# Patient Record
Sex: Male | Born: 1955
Health system: Southern US, Community
[De-identification: ages and names within clinical notes are randomized; demographics above are authoritative.]

## PROBLEM LIST (undated history)

## (undated) DIAGNOSIS — I2510 Atherosclerotic heart disease of native coronary artery without angina pectoris: Secondary | ICD-10-CM

## (undated) DIAGNOSIS — E785 Hyperlipidemia, unspecified: Secondary | ICD-10-CM

## (undated) DIAGNOSIS — K219 Gastro-esophageal reflux disease without esophagitis: Secondary | ICD-10-CM

## (undated) DIAGNOSIS — I200 Unstable angina: Secondary | ICD-10-CM

## (undated) DIAGNOSIS — I10 Essential (primary) hypertension: Secondary | ICD-10-CM

## (undated) DIAGNOSIS — G43109 Migraine with aura, not intractable, without status migrainosus: Secondary | ICD-10-CM

## (undated) HISTORY — PX: NASAL SEPTUM SURGERY: SHX37

## (undated) HISTORY — PX: SIGMOIDOSCOPY: SUR1295

## (undated) HISTORY — PX: APPENDECTOMY: SHX54

## (undated) HISTORY — PX: COLONOSCOPY: SHX174

## (undated) HISTORY — DX: Atherosclerotic heart disease of native coronary artery without angina pectoris: I25.10

## (undated) HISTORY — DX: Hyperlipidemia, unspecified: E78.5

## (undated) HISTORY — PX: INGUINAL HERNIA REPAIR: SUR1180

---
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
## 2008-06-16 ENCOUNTER — Emergency Department (HOSPITAL_BASED_OUTPATIENT_CLINIC_OR_DEPARTMENT_OTHER): Admission: EM | Admit: 2008-06-16 | Discharge: 2008-06-16 | Payer: Self-pay | Admitting: Emergency Medicine

## 2011-04-23 LAB — DIFFERENTIAL
Basophils Relative: 2 — ABNORMAL HIGH
Lymphs Abs: 1.4
Monocytes Absolute: 0.8
Monocytes Relative: 6
Neutro Abs: 9.9 — ABNORMAL HIGH

## 2011-04-23 LAB — BASIC METABOLIC PANEL
CO2: 30
Calcium: 9.1
Chloride: 101
GFR calc Af Amer: >60
Sodium: 138

## 2011-04-23 LAB — CBC
Hemoglobin: 16.5
MCHC: 34.7
RBC: 5.59

## 2011-04-23 LAB — URINALYSIS, ROUTINE W REFLEX MICROSCOPIC
Bilirubin Urine: NEGATIVE
Glucose, UA: NEGATIVE
Hgb urine dipstick: NEGATIVE
Specific Gravity, Urine: 1.017
Urobilinogen, UA: 0.2
pH: 7.5

## 2011-08-17 ENCOUNTER — Encounter (HOSPITAL_BASED_OUTPATIENT_CLINIC_OR_DEPARTMENT_OTHER): Payer: Self-pay | Admitting: *Deleted

## 2011-08-17 ENCOUNTER — Emergency Department (HOSPITAL_BASED_OUTPATIENT_CLINIC_OR_DEPARTMENT_OTHER)
Admission: EM | Admit: 2011-08-17 | Discharge: 2011-08-17 | Disposition: A | Discharge status: Home / Self Care | Payer: 59 | Attending: Emergency Medicine | Admitting: Emergency Medicine

## 2011-08-17 DIAGNOSIS — R040 Epistaxis: Secondary | ICD-10-CM | POA: Insufficient documentation

## 2011-08-17 MED ORDER — SILVER NITRATE-POT NITRATE 75-25 % EX MISC
CUTANEOUS | Status: AC
  Filled 2011-08-17: qty 2

## 2011-08-17 MED ORDER — SILVER NITRATE-POT NITRATE 75-25 % EX MISC
2.0000 | Freq: Once | CUTANEOUS | Status: AC
  Administered 2011-08-17: 2 via TOPICAL

## 2011-08-17 MED ORDER — COCAINE HCL 4 % EX SOLN
4.0000 mL | Freq: Once | CUTANEOUS | Status: AC
  Administered 2011-08-17: 4 mL via NASAL

## 2011-08-17 MED ORDER — COCAINE HCL 4 % EX SOLN
CUTANEOUS | Status: AC
  Filled 2011-08-17: qty 4

## 2011-08-17 MED ORDER — LIDOCAINE HCL 2 % EX GEL
CUTANEOUS | Status: AC
  Filled 2011-08-17: qty 20

## 2011-08-17 NOTE — ED Provider Notes (Signed)
History     CSN: [id number]  Arrival date & time 08/17/11  0416   First MD Initiated Contact with Patient 08/17/11 0424      Chief Complaint  Patient presents with  . Epistaxis    (Consider location/radiation/quality/duration/timing/severity/associated sxs/prior treatment) Patient is a 56 y.o. male presenting with nosebleeds. The history is provided by the patient.  Epistaxis  This is a new problem. The current episode started less than 1 hour ago. The problem has been rapidly improving. The problem is associated with aspirin. The bleeding has been from the left nare. He has tried nothing for the symptoms. His past medical history is significant for colds. His past medical history does not include bleeding disorder, sinus problems or frequent nosebleeds.  Pt has had a recent URI.  History reviewed. No pertinent past medical history.  Past Surgical History  Procedure Date  . Appendectomy     No family history on file.  History  Substance Use Topics  . Smoking status: Never Smoker   . Smokeless tobacco: Not on file  . Alcohol Use: Yes     occassionaly      Review of Systems  HENT: Positive for nosebleeds.   All other systems reviewed and are negative.    Allergies  Review of patient's allergies indicates no known allergies.  Home Medications   Current Outpatient Rx  Name Route Sig Dispense Refill  . ASPIRIN 81 MG PO TABS Oral Take 81 mg by mouth daily.      BP 139/101  Pulse 93  Resp 18  SpO2 95%  Physical Exam  Nursing note and vitals reviewed. Constitutional: He appears well-developed and well-nourished. No distress.  HENT:  Head: Normocephalic and atraumatic.  Right Ear: External ear normal.  Left Ear: External ear normal.  Nose: No nasal septal hematoma. Epistaxis is observed.       Left nares with fresh blood, no active bleeding  Eyes: Conjunctivae are normal. Right eye exhibits no discharge. Left eye exhibits no discharge. No scleral icterus.    Neck: Neck supple. No tracheal deviation present.  Cardiovascular: Normal rate.   Pulmonary/Chest: Effort normal. No stridor. No respiratory distress.  Musculoskeletal: He exhibits no edema.  Neurological: He is alert. Cranial nerve deficit: no gross deficits.  Skin: Skin is warm and dry. No rash noted.  Psychiatric: He has a normal mood and affect.    ED Course  EPISTAXIS MANAGEMENT Performed by: Linwood Dibbles R Authorized by: Linwood Dibbles R Consent: Verbal consent obtained. Anesthesia: see MAR for details Local anesthetic: topical anesthetic Treatment site: left anterior Repair method: silver nitrate Post-procedure assessment: bleeding stopped Treatment complexity: simple   (including critical care time)  Labs Reviewed - No data to display No results found.   1. Epistaxis       MDM  5:37 AM Pt monitored in the ED.  No further bleeding.  Discussed precautions and to consider ENT follow up.        Celene Kras, MD 08/17/11 [phone]

## 2011-08-17 NOTE — ED Notes (Signed)
Patient states his nose has been bleeding for the past 15 minutes, but he has not been holding pressure that whole time.

## 2011-08-19 ENCOUNTER — Emergency Department (HOSPITAL_BASED_OUTPATIENT_CLINIC_OR_DEPARTMENT_OTHER)
Admission: EM | Admit: 2011-08-19 | Discharge: 2011-08-19 | Disposition: A | Discharge status: Home / Self Care | Payer: 59 | Attending: Emergency Medicine | Admitting: Emergency Medicine

## 2011-08-19 ENCOUNTER — Encounter (HOSPITAL_BASED_OUTPATIENT_CLINIC_OR_DEPARTMENT_OTHER): Payer: Self-pay | Admitting: *Deleted

## 2011-08-19 DIAGNOSIS — R040 Epistaxis: Secondary | ICD-10-CM

## 2011-08-19 MED ORDER — HYDROCODONE-ACETAMINOPHEN 5-325 MG PO TABS: 2.0000 | ORAL_TABLET | ORAL | Status: AC | PRN

## 2011-08-19 NOTE — ED Provider Notes (Signed)
History     CSN: [id number]  Arrival date & time 08/19/11  1478   First MD Initiated Contact with Patient 08/19/11 2255      Chief Complaint  Patient presents with  . Epistaxis    (Consider location/radiation/quality/duration/timing/severity/associated sxs/prior treatment) Patient is a 56 y.o. male presenting with nosebleeds. The history is provided by the patient. No language interpreter was used.  Epistaxis  This is a recurrent problem. The current episode started 3 to 5 hours ago. The problem occurs constantly. The problem has been rapidly worsening. The problem is associated with aspirin. The bleeding has been from the left nare. He has tried applying pressure and vasoconstrictors for the symptoms. The treatment provided no relief. His past medical history is significant for sinus problems.  Pt had nosebleed cauterized 2 days ago.  Pt reports bleeding returned and has continued today  History reviewed. No pertinent past medical history.  Past Surgical History  Procedure Date  . Appendectomy     History reviewed. No pertinent family history.  History  Substance Use Topics  . Smoking status: Never Smoker   . Smokeless tobacco: Not on file  . Alcohol Use: Yes     occassionaly      Review of Systems  HENT: Positive for nosebleeds.   All other systems reviewed and are negative.    Allergies  Review of patient's allergies indicates no known allergies.  Home Medications   Current Outpatient Rx  Name Route Sig Dispense Refill  . ASPIRIN 81 MG PO TABS Oral Take 81 mg by mouth daily.    Marland Kitchen DIPHENHYDRAMINE HCL 50 MG PO CAPS Oral Take 50 mg by mouth at bedtime. For sleep      BP 134/89  Pulse 105  Temp(Src) 97.7 F (36.5 C) (Oral)  Resp 18  Ht 5\' 11"  (1.803 m)  Wt 195 lb (88.451 kg)  BMI 27.20 kg/m2  SpO2 99%  Physical Exam  Nursing note and vitals reviewed. Constitutional: He is oriented to person, place, and time. He appears well-developed and  well-nourished.  HENT:  Head: Normocephalic.  Mouth/Throat: Oropharynx is clear and moist.  Eyes: Pupils are equal, round, and reactive to light.  Neck: Normal range of motion.  Cardiovascular: Normal rate.   Neurological: He is alert and oriented to person, place, and time. He has normal reflexes.  Skin: Skin is warm.  Psychiatric: He has a normal mood and affect.    ED Course  Procedures (including critical care time)  Labs Reviewed - No data to display No results found.   No diagnosis found.    MDM     Pt counseled on follow up.  Pt advised to call ENT to be seen.  Packing/ballon removal in 2 days.      Langston Masker, [place] 08/19/11 2323

## 2011-08-19 NOTE — ED Notes (Signed)
C/o nosebleed x 1 hour

## 2011-09-09 NOTE — ED Provider Notes (Signed)
Medical screening examination/treatment/procedure(s) were performed by non-physician practitioner and as supervising physician I was immediately available for consultation/collaboration.   Deneen Slager, MD 09/09/11 1129 

## 2012-06-21 HISTORY — PX: CARDIAC CATHETERIZATION: SHX172

## 2012-06-25 ENCOUNTER — Observation Stay (HOSPITAL_COMMUNITY): Payer: No Typology Code available for payment source

## 2012-06-25 ENCOUNTER — Encounter (HOSPITAL_BASED_OUTPATIENT_CLINIC_OR_DEPARTMENT_OTHER): Payer: Self-pay | Admitting: *Deleted

## 2012-06-25 ENCOUNTER — Inpatient Hospital Stay (HOSPITAL_BASED_OUTPATIENT_CLINIC_OR_DEPARTMENT_OTHER)
Admission: EM | Admit: 2012-06-25 | Discharge: 2012-07-03 | DRG: 234 | Disposition: A | Discharge status: Home / Self Care | Payer: No Typology Code available for payment source | Attending: Thoracic Surgery (Cardiothoracic Vascular Surgery) | Admitting: Thoracic Surgery (Cardiothoracic Vascular Surgery)

## 2012-06-25 ENCOUNTER — Emergency Department (HOSPITAL_BASED_OUTPATIENT_CLINIC_OR_DEPARTMENT_OTHER): Payer: No Typology Code available for payment source

## 2012-06-25 DIAGNOSIS — E876 Hypokalemia: Secondary | ICD-10-CM | POA: Diagnosis present

## 2012-06-25 DIAGNOSIS — Z8249 Family history of ischemic heart disease and other diseases of the circulatory system: Secondary | ICD-10-CM

## 2012-06-25 DIAGNOSIS — E782 Mixed hyperlipidemia: Secondary | ICD-10-CM

## 2012-06-25 DIAGNOSIS — Y92009 Unspecified place in unspecified non-institutional (private) residence as the place of occurrence of the external cause: Secondary | ICD-10-CM

## 2012-06-25 DIAGNOSIS — I2510 Atherosclerotic heart disease of native coronary artery without angina pectoris: Principal | ICD-10-CM | POA: Diagnosis present

## 2012-06-25 DIAGNOSIS — R079 Chest pain, unspecified: Secondary | ICD-10-CM

## 2012-06-25 DIAGNOSIS — I200 Unstable angina: Secondary | ICD-10-CM | POA: Diagnosis present

## 2012-06-25 DIAGNOSIS — Z951 Presence of aortocoronary bypass graft: Secondary | ICD-10-CM

## 2012-06-25 DIAGNOSIS — T502X5A Adverse effect of carbonic-anhydrase inhibitors, benzothiadiazides and other diuretics, initial encounter: Secondary | ICD-10-CM | POA: Diagnosis present

## 2012-06-25 DIAGNOSIS — I10 Essential (primary) hypertension: Secondary | ICD-10-CM | POA: Diagnosis present

## 2012-06-25 DIAGNOSIS — E871 Hypo-osmolality and hyponatremia: Secondary | ICD-10-CM

## 2012-06-25 DIAGNOSIS — G43909 Migraine, unspecified, not intractable, without status migrainosus: Secondary | ICD-10-CM

## 2012-06-25 DIAGNOSIS — D62 Acute posthemorrhagic anemia: Secondary | ICD-10-CM | POA: Diagnosis not present

## 2012-06-25 DIAGNOSIS — Z7982 Long term (current) use of aspirin: Secondary | ICD-10-CM

## 2012-06-25 HISTORY — DX: Migraine with aura, not intractable, without status migrainosus: G43.109

## 2012-06-25 HISTORY — DX: Essential (primary) hypertension: I10

## 2012-06-25 LAB — BASIC METABOLIC PANEL
CO2: 29 mEq/L (ref 19–32)
CO2: 28 mEq/L (ref 19–32)
Calcium: 8.8 mg/dL (ref 8.4–10.5)
Calcium: 8.6 mg/dL (ref 8.4–10.5)
Chloride: 86 mEq/L — ABNORMAL LOW (ref 96–112)
Chloride: 87 mEq/L — ABNORMAL LOW (ref 96–112)
Creatinine, Ser: 0.9 mg/dL (ref 0.50–1.35)
Creatinine, Ser: 0.8 mg/dL (ref 0.50–1.35)
Glucose, Bld: 105 mg/dL — ABNORMAL HIGH (ref 70–99)
Glucose, Bld: 133 mg/dL — ABNORMAL HIGH (ref 70–99)

## 2012-06-25 LAB — CBC WITH DIFFERENTIAL/PLATELET
Eosinophils Absolute: 0.2 10*3/uL (ref 0.0–0.7)
Eosinophils Relative: 2 % (ref 0–5)
HCT: 44.6 % (ref 39.0–52.0)
Hemoglobin: 17.1 g/dL — ABNORMAL HIGH (ref 13.0–17.0)
Lymphocytes Relative: 40 % (ref 12–46)
Lymphs Abs: 4 10*3/uL (ref 0.7–4.0)
MCH: 29.7 pg (ref 26.0–34.0)
MCV: 77.6 fL — ABNORMAL LOW (ref 78.0–100.0)
Monocytes Absolute: 1.1 10*3/uL — ABNORMAL HIGH (ref 0.1–1.0)
Monocytes Relative: 11 % (ref 3–12)
RBC: 5.75 MIL/uL (ref 4.22–5.81)

## 2012-06-25 LAB — POCT I-STAT, CHEM 8
Creatinine, Ser: 1 mg/dL (ref 0.50–1.35)
Glucose, Bld: 104 mg/dL — ABNORMAL HIGH (ref 70–99)
Hemoglobin: 16.3 g/dL (ref 13.0–17.0)
Potassium: 3.4 mEq/L — ABNORMAL LOW (ref 3.5–5.1)
TCO2: 26 mmol/L (ref 0–100)

## 2012-06-25 MED ORDER — SODIUM CHLORIDE 0.9 % IV SOLN
1000.0000 mL | INTRAVENOUS | Status: DC
  Administered 2012-06-25 – 2012-06-29 (×4): 1000 mL via INTRAVENOUS

## 2012-06-25 MED ORDER — NITROGLYCERIN 0.4 MG SL SUBL: 0.4000 mg | SUBLINGUAL_TABLET | SUBLINGUAL | Status: DC | PRN

## 2012-06-25 MED ORDER — IOHEXOL 350 MG/ML SOLN
80.0000 mL | Freq: Once | INTRAVENOUS | Status: AC | PRN
  Administered 2012-06-25: 80 mL via INTRAVENOUS

## 2012-06-25 MED ORDER — PROPRANOLOL HCL ER 60 MG PO CP24
60.0000 mg | ORAL_CAPSULE | Freq: Every day | ORAL | Status: DC
  Administered 2012-06-26 – 2012-06-28 (×3): 60 mg via ORAL
  Filled 2012-06-25 (×4): qty 1

## 2012-06-25 MED ORDER — NITROGLYCERIN 0.4 MG SL SUBL
0.4000 mg | SUBLINGUAL_TABLET | Freq: Once | SUBLINGUAL | Status: AC
  Administered 2012-06-25: 0.4 mg via SUBLINGUAL

## 2012-06-25 MED ORDER — METOPROLOL TARTRATE 1 MG/ML IV SOLN
INTRAVENOUS | Status: AC
  Administered 2012-06-25: 2.5 mg via INTRAVENOUS
  Filled 2012-06-25: qty 5

## 2012-06-25 MED ORDER — ASPIRIN 81 MG PO CHEW
81.0000 mg | CHEWABLE_TABLET | Freq: Every day | ORAL | Status: DC
  Filled 2012-06-25: qty 1

## 2012-06-25 MED ORDER — ATORVASTATIN CALCIUM 40 MG PO TABS
40.0000 mg | ORAL_TABLET | Freq: Every day | ORAL | Status: DC
  Administered 2012-06-26 – 2012-06-28 (×3): 40 mg via ORAL
  Filled 2012-06-25 (×4): qty 1

## 2012-06-25 MED ORDER — POTASSIUM CHLORIDE CRYS ER 20 MEQ PO TBCR
40.0000 meq | EXTENDED_RELEASE_TABLET | Freq: Once | ORAL | Status: AC
  Administered 2012-06-25: 40 meq via ORAL
  Filled 2012-06-25: qty 2

## 2012-06-25 MED ORDER — ACETAMINOPHEN 325 MG PO TABS: 650.0000 mg | ORAL_TABLET | ORAL | Status: DC | PRN

## 2012-06-25 MED ORDER — POTASSIUM CHLORIDE 20 MEQ/15ML (10%) PO LIQD
40.0000 meq | Freq: Once | ORAL | Status: AC
  Administered 2012-06-25: 40 meq via ORAL
  Filled 2012-06-25: qty 30

## 2012-06-25 MED ORDER — ACETAMINOPHEN 325 MG PO TABS
650.0000 mg | ORAL_TABLET | Freq: Once | ORAL | Status: AC
  Administered 2012-06-25: 650 mg via ORAL
  Filled 2012-06-25: qty 2

## 2012-06-25 MED ORDER — NITROGLYCERIN 0.4 MG SL SUBL
SUBLINGUAL_TABLET | SUBLINGUAL | Status: AC
  Administered 2012-06-25: 0.4 mg via SUBLINGUAL
  Filled 2012-06-25: qty 25

## 2012-06-25 MED ORDER — MORPHINE SULFATE 2 MG/ML IJ SOLN
2.0000 mg | Freq: Once | INTRAMUSCULAR | Status: AC
  Administered 2012-06-25: 2 mg via INTRAVENOUS
  Filled 2012-06-25: qty 1

## 2012-06-25 MED ORDER — METOPROLOL TARTRATE 1 MG/ML IV SOLN
2.5000 mg | Freq: Once | INTRAVENOUS | Status: AC
  Administered 2012-06-25: 2.5 mg via INTRAVENOUS

## 2012-06-25 MED ORDER — ASPIRIN 81 MG PO CHEW
324.0000 mg | CHEWABLE_TABLET | Freq: Once | ORAL | Status: AC
  Administered 2012-06-25: 324 mg via ORAL
  Filled 2012-06-25: qty 4

## 2012-06-25 MED ORDER — SODIUM CHLORIDE 0.9 % IV SOLN
Freq: Once | INTRAVENOUS | Status: AC
  Administered 2012-06-25: 12:00:00 via INTRAVENOUS

## 2012-06-25 MED ORDER — SODIUM CHLORIDE 0.9 % IV SOLN
1000.0000 mL | INTRAVENOUS | Status: DC
  Administered 2012-06-25 (×2): 1000 mL via INTRAVENOUS

## 2012-06-25 MED ORDER — METOPROLOL TARTRATE 25 MG PO TABS
100.0000 mg | ORAL_TABLET | Freq: Once | ORAL | Status: AC
  Administered 2012-06-25: 100 mg via ORAL
  Filled 2012-06-25: qty 4

## 2012-06-25 MED ORDER — ASPIRIN 81 MG PO CHEW
324.0000 mg | CHEWABLE_TABLET | ORAL | Status: AC
  Administered 2012-06-26: 324 mg via ORAL
  Filled 2012-06-25: qty 4

## 2012-06-25 MED ORDER — POTASSIUM CHLORIDE 10 MEQ/100ML IV SOLN
10.0000 meq | Freq: Once | INTRAVENOUS | Status: AC
  Administered 2012-06-25: 10 meq via INTRAVENOUS
  Filled 2012-06-25: qty 100

## 2012-06-25 MED ORDER — ONDANSETRON HCL 4 MG/2ML IJ SOLN: 4.0000 mg | Freq: Four times a day (QID) | INTRAMUSCULAR | Status: DC | PRN

## 2012-06-25 MED ORDER — MORPHINE SULFATE 4 MG/ML IJ SOLN: 2.0000 mg | INTRAMUSCULAR | Status: DC | PRN

## 2012-06-25 MED ORDER — TRAMADOL HCL 50 MG PO TABS: 50.0000 mg | ORAL_TABLET | Freq: Two times a day (BID) | ORAL | Status: DC | PRN

## 2012-06-25 MED ORDER — LORATADINE 10 MG PO TABS
10.0000 mg | ORAL_TABLET | Freq: Every day | ORAL | Status: DC
  Filled 2012-06-25 (×4): qty 1

## 2012-06-25 NOTE — ED Provider Notes (Signed)
History     CSN: [id number]  Arrival date & time 06/25/12  4098   First MD Initiated Contact with Patient 06/25/12 [phone]      Chief Complaint  Patient presents with  . Chest Pain    (Consider location/radiation/quality/duration/timing/severity/associated sxs/prior treatment) HPI This is a 56 year old male who states he had a migraine headache yesterday evening about 7:30. The pain was left side of his head. It was like prior migraines. He took some Advil and went to bed. The headache had resolved by 10 PM. He woke about 5 this morning with sensation of generalized weakness, lightheadedness and mild chest pressure; he felt a chest pressure into his back. The lightheadedness is worse with standing. The chest pressure has resolved on its own without treatment. It was not accompanied by shortness of breath, diaphoresis, radiation, nausea or vomiting. He has no history of coronary artery disease but has a brother who had bypass surgery in his 30s. He does not smoke.  Past Medical History  Diagnosis Date  . Hypertension     Past Surgical History  Procedure Date  . Appendectomy     History reviewed. No pertinent family history.  History  Substance Use Topics  . Smoking status: Never Smoker   . Smokeless tobacco: Not on file  . Alcohol Use: Yes     Comment: occassionaly      Review of Systems  All other systems reviewed and are negative.    Allergies  Review of patient's allergies indicates no known allergies.  Home Medications   Current Outpatient Rx  Name  Route  Sig  Dispense  Refill  . CETIRIZINE HCL 10 MG PO TABS   Oral   Take 10 mg by mouth daily.         . CHLORTHALIDONE 25 MG PO TABS   Oral   Take 25 mg by mouth daily.         Marland Kitchen PREDNISONE 10 MG PO TABS   Oral   Take 10 mg by mouth daily.         . ASPIRIN 81 MG PO TABS   Oral   Take 81 mg by mouth daily.         Marland Kitchen DIPHENHYDRAMINE HCL 50 MG PO CAPS   Oral   Take 50 mg by mouth at bedtime.  For sleep           BP 139/83  Temp 97.7 F (36.5 C) (Oral)  Resp 17  Ht 6' (1.829 m)  Wt 192 lb (87.091 kg)  BMI 26.04 kg/m2  SpO2 97%  Physical Exam General: Well-developed, well-nourished male in no acute distress; appearance consistent with age of record HENT: normocephalic, atraumatic Eyes: pupils equal round and reactive to light; extraocular muscles intact Neck: supple Heart: regular rate and rhythm; no murmurs, rubs or gallops Lungs: clear to auscultation bilaterally Abdomen: soft; nondistended; nontender; bowel sounds present Extremities: No deformity; full range of motion; pulses normal Neurologic: Awake, alert and oriented; motor function intact in all extremities and symmetric; no facial droop Skin: Warm and dry Psychiatric: Normal mood and affect    ED Course  Procedures (including critical care time)     MDM   Nursing notes and vitals signs, including pulse oximetry, reviewed.  Summary of this visit's results, reviewed by myself:  Labs:  Results for orders placed during the hospital encounter of 06/25/12 (from the past 24 hour(s))  BASIC METABOLIC PANEL     Status: Abnormal   Collection Time  06/25/12  6:20 AM      Component Value Range   Sodium 124 (*) 135 - 145 mEq/L   Potassium 2.8 (*) 3.5 - 5.1 mEq/L   Chloride 86 (*) 96 - 112 mEq/L   CO2 29  19 - 32 mEq/L   Glucose, Bld 105 (*) 70 - 99 mg/dL   BUN 16  6 - 23 mg/dL   Creatinine, Ser 4.78  0.50 - 1.35 mg/dL   Calcium 8.8  8.4 - 29.5 mg/dL   GFR calc non Af Amer >90  >90 mL/min   GFR calc Af Amer >90  >90 mL/min  TROPONIN I     Status: Normal   Collection Time   06/25/12  6:20 AM      Component Value Range   Troponin I <0.30  <0.30 ng/mL    Imaging Studies: No results found.    EKG Interpretation:  Date & Time: 06/25/2012 6:00 AM  Rate: 77  Rhythm: normal sinus rhythm  QRS Axis: normal  Intervals: normal  ST/T Wave abnormalities: normal  Conduction Disutrbances:Incomplete  right bundle-branch block  Narrative Interpretation:   Old EKG Reviewed: none available  6:53 AM Patient discussed with Dr. Rulon Abide. He will follow-up on results and make disposition.         Hanley Seamen, MD 06/25/12 [phone]

## 2012-06-25 NOTE — Progress Notes (Signed)
Utilization review completed.  P.J. Yaziel Brandon,RN,BSN Case Manager [phone]  

## 2012-06-25 NOTE — H&P (Signed)
History and Physical  Patient ID: Terry Hansen MRN: [id number], DOB: Jun 06, 1956 Date of Encounter: 06/25/2012, 4:42 PM Primary Physician: No primary provider on file. Primary Cardiologist: New to [facility], being seen by Dr. Daleen Squibb  Chief Complaint: headache, dizziness, migraine aura, and chest pain Reason for Admit: CAD by cardiac CT, marked hyponatremia/hypokalemia  HPI: Terry Hansen is a 56 y/o M with history of HTN, ocular migraines, and significant family history of CAD with brother having CABG age 91 with later redo. He has not quite felt well since recovering from a viral illness in September. He has had intermittent dry cough and nasal drainage. He was recently given a short course of prednisone and was started on Zyrtec and chlorthalidone. This has not seemed to help. Yesterday he had a busy day at work and in the evening began to experience his typical scotoma. It resolved but then recurred which was unusual. This was followed by a severe headache, which he usually does not experience. He felt weak, shaky, and dizzy. He took Advil and ate dinner with some relief then eventually went to bed. This morning around 4:45am, he awoke and still didn't feel quite right with weakness. He also noticed nonradiating substernal chest pain/tightness. He had mild nausea. No associated with any SOB or diaphoresis. He also has experienced chest pressure when he becomes anxious. He used to ride a bike without exertional symptoms, but has not done much activity since he started feeling poorly in September. (He reports basic bloodwork being done as part of his workup that he believes was normal as an outpatient, but denies any sort of imaging.)  His episode of CP this morning lasted 1.5 hours. He came to the ED where labs revealed marked hyponatremia (123), hypokalemia (2.8). Neg troponin x 2. EKG showed IRBBB with nonspecific ST-T changes. Cardiac CT showed age advanced obstructive coronary artery disease with mixed plaque in  the proximal LAD that results in greater than 75% luminal stenosis, along with other areas of extensive mixed plaque. It also showed aortic valvular calcifications.   Past Medical History  Diagnosis Date  . Hypertension   . Ocular migraine      Most Recent Cardiac Studies: None   Surgical History:  Past Surgical History  Procedure Date  . Appendectomy   . Nasal septum surgery     For uncontrolled epistaxis early 2013     Home Meds: Prior to Admission medications   Medication Sig Start Date End Date Taking? Authorizing Provider  cetirizine (ZYRTEC) 10 MG tablet Take 10 mg by mouth daily.   Yes Historical Provider, MD  chlorthalidone (HYGROTON) 25 MG tablet Take 25 mg by mouth daily.   Yes Historical Provider, MD  Coenzyme Q10 200 MG capsule Take 200 mg by mouth daily. disontinue while in hospital   Yes Historical Provider, MD  ibuprofen (ADVIL,MOTRIN) 200 MG tablet Take 600 mg by mouth every 6 (six) hours as needed. migraine headache   Yes Historical Provider, MD  predniSONE (DELTASONE) 10 MG tablet Take 20 mg by mouth daily. Patient course completed 06-24-2012   Yes Historical Provider, MD  aspirin 81 MG tablet Take 81 mg by mouth daily.    Historical Provider, MD  diphenhydrAMINE (BENADRYL) 50 MG capsule Take 50 mg by mouth at bedtime. For sleep    Historical Provider, MD    Allergies: No Known Allergies  History   Social History  . Marital Status: Legally Separated    Spouse Name: N/A    Number of  Children: N/A  . Years of Education: N/A   Occupational History  . Not on file.   Social History Main Topics  . Smoking status: Never Smoker   . Smokeless tobacco: Not on file  . Alcohol Use: Yes     Comment: 1 drink/month  . Drug Use: No  . Sexually Active: Not on file   Other Topics Concern  . Not on file   Social History Narrative  . No narrative on file     Family History  Problem Relation Age of Onset  . Heart disease Brother     CABG age 92, MI ~49, redo  bypass ~52  . Heart disease Paternal Grandmother   . Heart disease Cousin     Maternal side    Review of Systems: General: negative for chills, fever, night sweats or weight changes.  Cardiovascular: see above. He has intermittent CP that he feels is musculoskeletal in nature from back problems but this was different - very brief and resolves with position changes. A few months ago had exertional dyspnea that improved as activity went on. Dermatological: negative for rash Respiratory: negative for cough or wheezing Urologic: negative for hematuria Abdominal: negative for diarrhea, bright red blood per rectum, melena, or hematemesis Neurologic: see above.  As part of his workup for sinus drainage/dry cough, he reports that he had bloodwork before this recent bloodwork but does not think it was abnormal. No strange tastes in mouth or change in smell/taste. No syncope. All other systems reviewed and are otherwise negative except as noted above.  Labs:   Lab Results  Component Value Date   WBC 10.0 06/25/2012   HGB 16.3 06/25/2012   HCT 48.0 06/25/2012   MCV 77.6* 06/25/2012   PLT 310 06/25/2012    Lab 06/25/12 1105 06/25/12 0655  NA 126* --  K 3.4* --  CL 90* --  CO2 -- 28  BUN 12 --  CREATININE 1.00 --  CALCIUM -- 8.6  PROT -- --  BILITOT -- --  ALKPHOS -- --  ALT -- --  AST -- --  GLUCOSE 104* --    Basename 06/25/12 0620  CKTOTAL --  CKMB --  TROPONINI <0.30   Radiology/Studies:  Dg Chest 2 View 06/25/2012  *RADIOLOGY REPORT*  Clinical Data: Chest pain and cough.  CHEST - 2 VIEW  Comparison: None.  Findings: The heart size and pulmonary vascularity are normal and the lungs are clear.  No osseous abnormality.  IMPRESSION: Normal chest.   Original Report Authenticated By: Francene Boyers, M.D.    Ct Heart Morp W/cta Cor W/score W/ca W/cm &/or Wo/cm 06/25/2012  *RADIOLOGY REPORT*  INDICATION: Upper chest pain.  Severe family history of coronary artery disease.  Hyperlipidemia  and hypertension.  CT ANGIOGRAPHY OF THE HEART, CORONARY ARTERY, STRUCTURE, AND MORPHOLOGY  COMPARISON:  Plain film of earlier in the day.  No prior CT.  CONTRAST: 80mL OMNIPAQUE IOHEXOL 350 MG/ML SOLN  TECHNIQUE:  CT angiography of the coronary vessels was performed on a 256 channel system using prospective ECG gating.  A scout and ECG- gated noncontrast exam (for calcium scoring) were performed. Appropriate delay was determined by bolus tracking after injection of iodinated contrast, and an ECG-gated coronary CTA was performed with sub-mm slice collimation during late diastole.  Imaging post processing was performed on an independent workstation creating multiplanar and 3-D images, allowing for quantitative analysis of the heart and coronary arteries.  Note that this exam targets the heart and the chest  was not imaged in its entirety.  PREMEDICATION: Lopressor  100 mg, P.O. Lopressor  2.5 mg, IV Nitroglycerin  0.4  mcg, sublingual.  FINDINGS: Technical quality: Good. Heart rate:  60.  CORONARY ARTERIES: Left Main:              Absent (separate origin of the LAD and left circumflex.) LAD:              Has calcified and noncalcified plaque just distal to its origin.  Positive remodeling.  No stenosis proximally.  Just distal to the origin of a first diagonal is a calcified and noncalcified plaque which causes greater than 75% stenosis. Opacified distally with areas of primarily calcified plaque identified. Diagonals:              Patent diminutive first diagonal. Extensive calcified and noncalcified plaque within the second diagonal proximally.  This branches and has both calcified and noncalcified plaque throughout its distal course.  Patent. LCx:              Large, dominant vessel which arises immediately posterior to the LAD from the aorta.  Mild misregistration in its mid to distal course.  Without significant stenosis, continues distally to supply posterolateral branches and the PDA. OMs:               Branching large first marginal which has calcified plaque in its proximal and midcourse with positive remodeling.  No definite stenosis.  Mild misregistration. A second marginal is moderate sized patent. RCA:              Small to moderate sized, nondominant vessel. Calcified and noncalcified plaque with positive remodeling just distal to its origin.  Poorly evaluated in its mid to distal course secondary to small size and misregistration artifact.  No definite high-grade stenosis. PDA:                    Moderate sized and supplied by the left circumflex. Dominance:        Left-sided.  CORONARY CALCIUM: Total Agatston Score:         151 (68.7 in the LAD, 61 in the diagonals, 12.70 in the left circumflex, and 8.9 in the RCA. MESA database percentile:     79th, corresponding to an "arterial age" of 20 years.  AORTA AND PULMONARY MEASUREMENTS:  Aortic root (21 - 40 mm):     25 mm at the annulus                               36 mm at the sinuses of Valsalva                               22 mm at the sinotubular  junction                                Ascending aorta:  (< 40 mm):  25 mm Descending aorta:  (< 40 mm): 24 mm Main pulmonary artery: (< 30 mm): 25 mm  OTHER FINDINGS: Lungs/pleura: Volume loss and subsegmental atelectasis or scarring at the right greater than left lung bases.  No pleural fluid.  Heart/Mediastinum: No imaged thoracic adenopathy.  No aortic dissection. No central pulmonary embolism, on this non-dedicated study.  No cardiac mass.  No left atrial appendage  thrombus. Calcifications involving the aortic valve leaflets, most apparent on coronal image 30/series 8088.  Upper abdomen: No significant findings.  Bones/Musculoskeletal:  No acute osseous abnormality.  IMPRESSION:  1.  Age advanced, obstructive coronary artery disease.  There is mixed plaque in the proximal LAD that results in greater than 75% luminal stenosis. 2.  Other areas of extensive mixed plaque with foci of positive remodeling. 3.   Separate origin of the LAD and left circumflex, left dominant system. 4.  Aortic valvular calcifications.  Consider non emergent echocardiography to exclude aortic valve disease. 5.  Suboptimal evaluation, especially of the right coronary artery secondary to mild misregistration artifact.  Report was called to CDU mid level, Sherry at 3:27 p.m. on 06/25/2012.   Original Report Authenticated By: Jeronimo Greaves, M.D.    EKG: no prior to compare to 8:30am - NSR with sinus arrhythmia, 62bpm, incomplete RBBB, TWI V2, no acute ischemic changes 9:35am - NSR 626pm, incomplete RBBB, TWI V2, no acute ischemic changes  Physical Exam: Blood pressure 135/82, pulse 61, temperature 98.3 F (36.8 C), temperature source Oral, resp. rate 19, height 6' (1.829 m), weight 192 lb (87.091 kg), SpO2 97.00%. General: Well developed, well nourished WM in no acute distress. Head: Normocephalic, atraumatic, sclera non-icteric, no xanthomas, nares are without discharge. No lymphadenopathy palpated. Neck: Negative for carotid bruits. JVD not elevated.  Lungs: Clear bilaterally to auscultation without wheezes, rales, or rhonchi. Breathing is unlabored. Heart: RRR with S1 S2. No murmurs, rubs, or gallops appreciated. Abdomen: Soft, non-tender, non-distended with normoactive bowel sounds. No hepatomegaly. No rebound/guarding. No obvious abdominal masses. Msk:  Strength and tone appear normal for age. Extremities: No clubbing or cyanosis. No edema.  Distal pedal pulses are 2+ and equal bilaterally. Neuro: Alert and oriented X 3. No focal deficit. No facial asymmetry. Moves all extremities spontaneously. Psych:  Responds to questions appropriately with a normal affect.   ASSESSMENT AND PLAN:   1. Chest pain concerning for unstable angina with significant CAD noted on cardiac CT - add aspirin, statin, BB. Check lipids. Would recommend cardiac catheterization to further elucidate (note that he has absent left main). Given lack of  CP/negative enzymes at present, will hold off on full dose anticoagulation for now.   2. Ocular migraine headache - possibly triggered by #3. If his symptoms do not improve with electrolyte normalization, may need to consider imaging. Start propranolol tomorrow as below. Provide PRN analgesia.   3. Electrolyte abnormalities with marked hyponatremia, hypockalemia - suspect this is due to his chlorthalidone which we will discontinue. Give additional KCl PO now. Will discuss IVF with MD (will need to raise sodium slowly). Follow lytes with BMET in AM. Neuro checks q4hr while repleting sodium.  4. Aortic valve calcifications on cardiac CT - obtain echocardiogram.   5. HTN - d/c chlorthalidone. Start propranolol ER in AM (HR in 60s now from lopressor earlier, was 70s-80s) as multipurpose for CAD, migraine prophylaxis and HTN.   6. Recent post-nasal drip/dry cough - continue Zyrtec. He had nasal surgery earlier this year for epistaxis. He denies any recurrence of epistaxis, metallic taste, or unusual smell/taste/hearing changes. Will need to f/u PCP.   Will make him inpatient under presumption that he will require PCI, but if he goes home tomorrow he should be changed to obs.  Signed, Ronie Spies PA-C 06/25/2012, 4:42 PM  Patient examined and chart reviewed. Reviewed cardiac CT and worrisome mid LAD lesion Plan cath in am Risks discussed willing to proceed

## 2012-06-25 NOTE — ED Notes (Signed)
Pt c/o central chest pressure since 5 am. Pt denies SOB or diaphoresis. Pt states that he has been under more stress lately and is recovering from a bad migraine headache last night.

## 2012-06-25 NOTE — ED Notes (Signed)
Pt also states he has had a cough for weeks now and is being treated for allergy sxs.

## 2012-06-25 NOTE — ED Notes (Signed)
Patient arrived via [facility], patient in NAD at this time, patient denies pain, patient ambulates to restroom with no assistance, patient IVF infusing with no difficulty.

## 2012-06-25 NOTE — ED Provider Notes (Signed)
Medical screening examination/treatment/procedure(s) were performed by non-physician practitioner and as supervising physician I was immediately available for consultation/collaboration.   Carleene Cooper III, MD 06/25/12 [phone]

## 2012-06-25 NOTE — ED Provider Notes (Signed)
He arrives from [facility], placed on CPP by Dr. Rulon Abide. He has had symptoms of chest tightness the patient thought was muscular for weeks. This morning he went to [facility] for evaluation of lightheadedness, weakness and increased tightness in his chest. No N, V, fever, has had a cough for days. He reports negative stress test one year ago in [facility]. He also reports recently starting all current medications 2 weeks ago.   Review of lab studies shows hyponatremia of 123 at 6:55 this morning. He has been receiving fluids. Repeat ordered along with second troponin. He had another episode of lightheadedness, weakness and increased chest tightness while in CDU. Repeat EKG unchanged. Will continue to monitor and consult cardiology prior to CTA.  Discussed with Dr. Daleen Squibb, cardiology. Ok to proceed with CTA.  3:30:  CTA showing advanced CAD with LAD stenosis of greater than 75%. Cardiology consulted.  Rodena Medin, PA-C 06/25/12 1529

## 2012-06-25 NOTE — ED Notes (Signed)
Patient is resting comfortably. 

## 2012-06-25 NOTE — ED Notes (Signed)
Pt.ekg is done show to dr,davidson .

## 2012-06-25 NOTE — ED Notes (Signed)
Patient just received his dinner.

## 2012-06-25 NOTE — ED Notes (Signed)
NITRO 0.4 MG SL x1 and 2.5MG  METOPROLOL IVP X 1 given in CT CORONARY VO DR Jeronimo Greaves Radiologist, NOT DR DAVID Reche Dixon

## 2012-06-25 NOTE — ED Provider Notes (Signed)
Pt placed in CDU on CP protocol.  CTA showing advanced CAD with LAD stenosis of greater than 75%. Cardiology consulted and has evaluated the patient.  5:55 PM Pt asking to eat.  Cardiology PA-C agreed this was OK.  Cardiology will admit for further workup.   Dahlia Client Statia Burdick, PA-C 06/25/12 [phone]

## 2012-06-25 NOTE — ED Notes (Signed)
Patient out of dept for Cardiac CT

## 2012-06-25 NOTE — ED Notes (Signed)
MD at bedside. 

## 2012-06-25 NOTE — ED Provider Notes (Signed)
Medical screening examination/treatment/procedure(s) were performed by non-physician practitioner and as supervising physician I was immediately available for consultation/collaboration.   Gwyneth Sprout, MD 06/25/12 2325

## 2012-06-25 NOTE — ED Notes (Signed)
[facility] at bedside preparing to transport patient.

## 2012-06-25 NOTE — ED Provider Notes (Addendum)
History     CSN: [id number]  Arrival date & time 06/25/12  2130   First MD Initiated Contact with Patient 06/25/12 [phone]      Chief Complaint  Patient presents with  . Chest Pain   Assumed patient care from Dr. Read Drivers  (Consider location/radiation/quality/duration/timing/severity/associated sxs/prior treatment) HPIDouglas Doren is a 56 y.o. male with past medical history of hypertension he was recently begun therapy with chlorthalidone last week presents with chest pain that started early this morning. According to the patient, he's been under increasing stress and on the way home last night starting getting a migraine headache. Patient had another second migraine headache and felt "wiped out" after that was able to go to sleep. Patient woke up with central pressure substernal, about 5:00, described as intense pressure, severe, brought into the emergency department, was associated with some dizziness and weakness, no shortness of breath, diaphoresis, nausea or vomiting. Patient says his pain got better after aspirin in the emergency department but did last for about an hour to an hour and half. Patient has a history of hypertension, some dyslipidemia-however recently been treated for this, he says his HDL has always been slightly low and his LDLs have been slightly high. Patient's family history is pertinent for a brother who had his first bypass quadruple at age 33 and had a second quadruple bypass 8 years later following an MI.  Patient does not smoke, but is prone to stress and anxiety.     Past Medical History  Diagnosis Date  . Hypertension     Past Surgical History  Procedure Date  . Appendectomy     History reviewed. No pertinent family history.  History  Substance Use Topics  . Smoking status: Never Smoker   . Smokeless tobacco: Not on file  . Alcohol Use: Yes     Comment: occassionaly      Review of Systems At least 10pt or greater review of systems completed and  are negative except where specified in the HPI.  Allergies  Review of patient's allergies indicates no known allergies.  Home Medications   Current Outpatient Rx  Name  Route  Sig  Dispense  Refill  . CETIRIZINE HCL 10 MG PO TABS   Oral   Take 10 mg by mouth daily.         . CHLORTHALIDONE 25 MG PO TABS   Oral   Take 25 mg by mouth daily.         Marland Kitchen PREDNISONE 10 MG PO TABS   Oral   Take 10 mg by mouth daily.         . ASPIRIN 81 MG PO TABS   Oral   Take 81 mg by mouth daily.         Marland Kitchen DIPHENHYDRAMINE HCL 50 MG PO CAPS   Oral   Take 50 mg by mouth at bedtime. For sleep           BP 116/82  Pulse 84  Temp 97.7 F (36.5 C) (Oral)  Resp 17  Ht 6' (1.829 m)  Wt 192 lb (87.091 kg)  BMI 26.04 kg/m2  SpO2 97%  Physical Exam  Nursing notes reviewed.  Electronic medical record reviewed. VITAL SIGNS:   Filed Vitals:   06/25/12 0625 06/25/12 0626 06/25/12 0629 06/25/12 0807  BP: 127/80 121/84 116/82 136/73  Pulse: 72 78 84 76  Temp:    98.4 F (36.9 C)  TempSrc:      Resp:  20  Height:      Weight:      SpO2:    99%   CONSTITUTIONAL: Awake, oriented, appears non-toxic HENT: Atraumatic, normocephalic, oral mucosa pink and moist, airway patent. Nares patent without drainage. External ears normal. EYES: Conjunctiva clear, EOMI, PERRLA NECK: Trachea midline, non-tender, supple CARDIOVASCULAR: Normal heart rate, Normal rhythm, No murmurs, rubs, gallops PULMONARY/CHEST: Clear to auscultation, no rhonchi, wheezes, or rales. Symmetrical breath sounds. Non-tender. ABDOMINAL: Non-distended, soft, non-tender - no rebound or guarding.  BS normal. NEUROLOGIC: Non-focal, moving all four extremities, no gross sensory or motor deficits. EXTREMITIES: No clubbing, cyanosis, or edema SKIN: Warm, Dry, No erythema, No rash  ED Course  Procedures (including critical care time) Reviewed prior EKG obtained by Dr. Read Drivers  Date: 06/25/2012  Rate: 77  Rhythm: normal  sinus rhythm  QRS Axis: normal  Intervals: normal  ST/T Wave abnormalities: normal  Conduction Disutrbances: Incomplete RBBB  Narrative Interpretation: Normal sinus rhythm with incomplete right bundle branch block-no ST or T wave abnormalities suggestive of ischemia or infarction   Date: 06/25/2012 time: 08 30  Rate: 62  Rhythm: normal sinus rhythm  QRS Axis: normal  Intervals: normal  ST/T Wave abnormalities: normal  Conduction Disutrbances: Incomplete right bundle branch block  Narrative Interpretation: NSR and incomplete right bundle branch block-no ST or T wave abnormalities suggestive of ischemia or infarction   Labs Reviewed  CBC WITH DIFFERENTIAL - Abnormal; Notable for the following:    Hemoglobin 17.1 (*)     MCV 77.6 (*)     MCHC 38.3 (*)  RULED OUT INTERFERING SUBSTANCES   Monocytes Absolute 1.1 (*)     All other components within normal limits  BASIC METABOLIC PANEL - Abnormal; Notable for the following:    Sodium 124 (*)     Potassium 2.8 (*)     Chloride 86 (*)     Glucose, Bld 105 (*)     All other components within normal limits  BASIC METABOLIC PANEL - Abnormal; Notable for the following:    Sodium 123 (*)     Potassium 3.0 (*)     Chloride 87 (*)     Glucose, Bld 133 (*)     All other components within normal limits  TROPONIN I   Dg Chest 2 View  06/25/2012  *RADIOLOGY REPORT*  Clinical Data: Chest pain and cough.  CHEST - 2 VIEW  Comparison: None.  Findings: The heart size and pulmonary vascularity are normal and the lungs are clear.  No osseous abnormality.  IMPRESSION: Normal chest.   Original Report Authenticated By: Francene Boyers, M.D.      1. Chest pain       MDM  Taggert Bozzi is a 56 y.o. male presenting with chest pain. Patient's EKG and troponin are both negative for ischemia/infarction. Patient's BMP does show that he is dehydrated with some hypokalemia likely from his chlorthalidone therapy which was started about a week ago. My concern  is that this patient has a pertinent family history with his brother having had 2 quadruple bypass surgery starting at age 31 in this patient is 9. According the patient, his brother had no predisposing factors either, no dyslipidemia, hypertension or cocaine abuse just "stress."  I do not think this patient is high-risk at this point, I do think he will require further testing to further risk stratify the patient based on family history.  The emergent differential diagnosis of chest pain includes: Acute coronary syndrome, pericarditis, aortic dissection, pulmonary  embolism, tension pneumothorax, pneumonia, and esophageal rupture. Of these items, acute coronary syndrome remains as a possibility in the differential diagnosis.   Discussed with Dr. Ignacia Palma at [facility], patient will be transferred to the clinical decision unit for coronary CT and stress echo.   06/25/2012 8:20 AM Patient says he's having some ache in his chest at this point - he says this time this is achy he says it may be related to stress because he is concerned about his heart. He says this is different from the chest pressure, repeat EKG is unchanged with no ST or T wave abnormalities suggestive of ischemia or infarction. Treat the patient with small dose of morphine.  Patient stable for transfer   Patient will require by mouth potassium supplementation at discharge  Jones Skene, MD 06/25/12 [phone]

## 2012-06-25 NOTE — ED Notes (Signed)
Report given to Jay, RN [facility] CDU.

## 2012-06-25 NOTE — ED Notes (Signed)
Patient transported to X-ray 

## 2012-06-26 ENCOUNTER — Other Ambulatory Visit: Payer: Self-pay | Admitting: *Deleted

## 2012-06-26 ENCOUNTER — Encounter (HOSPITAL_COMMUNITY)
Admission: EM | Disposition: A | Discharge status: Home / Self Care | Payer: Self-pay | Source: Home / Self Care | Attending: Thoracic Surgery (Cardiothoracic Vascular Surgery)

## 2012-06-26 ENCOUNTER — Encounter (HOSPITAL_COMMUNITY): Payer: No Typology Code available for payment source

## 2012-06-26 DIAGNOSIS — E782 Mixed hyperlipidemia: Secondary | ICD-10-CM

## 2012-06-26 DIAGNOSIS — I200 Unstable angina: Secondary | ICD-10-CM

## 2012-06-26 DIAGNOSIS — E871 Hypo-osmolality and hyponatremia: Secondary | ICD-10-CM

## 2012-06-26 DIAGNOSIS — G43909 Migraine, unspecified, not intractable, without status migrainosus: Secondary | ICD-10-CM

## 2012-06-26 DIAGNOSIS — I2510 Atherosclerotic heart disease of native coronary artery without angina pectoris: Secondary | ICD-10-CM

## 2012-06-26 HISTORY — PX: LEFT HEART CATHETERIZATION WITH CORONARY ANGIOGRAM: SHX5451

## 2012-06-26 LAB — PROTIME-INR: Prothrombin Time: 13.3 seconds (ref 11.6–15.2)

## 2012-06-26 LAB — HEPATIC FUNCTION PANEL
ALT: 15 U/L (ref 0–53)
AST: 12 U/L (ref 0–37)
Albumin: 3.3 g/dL — ABNORMAL LOW (ref 3.5–5.2)
Total Protein: 6.2 g/dL (ref 6.0–8.3)

## 2012-06-26 LAB — BASIC METABOLIC PANEL
CO2: 23 mEq/L (ref 19–32)
Calcium: 8.7 mg/dL (ref 8.4–10.5)
Chloride: 102 mEq/L (ref 96–112)
Glucose, Bld: 95 mg/dL (ref 70–99)
Potassium: 4.2 mEq/L (ref 3.5–5.1)
Sodium: 135 mEq/L (ref 135–145)

## 2012-06-26 LAB — LIPID PANEL
HDL: 40 mg/dL (ref >39)
LDL Cholesterol: 88 mg/dL (ref 0–99)
Total CHOL/HDL Ratio: 3.6 RATIO
Triglycerides: 83 mg/dL (ref <150)
VLDL: 17 mg/dL (ref 0–40)

## 2012-06-26 LAB — CBC
Hemoglobin: 16.4 g/dL (ref 13.0–17.0)
MCH: 29.9 pg (ref 26.0–34.0)
RBC: 5.48 MIL/uL (ref 4.22–5.81)
WBC: 8.7 10*3/uL (ref 4.0–10.5)

## 2012-06-26 LAB — HEMOGLOBIN A1C: Mean Plasma Glucose: 114 mg/dL (ref <117)

## 2012-06-26 SURGERY — LEFT HEART CATHETERIZATION WITH CORONARY ANGIOGRAM
Anesthesia: LOCAL

## 2012-06-26 MED ORDER — NITROGLYCERIN 0.2 MG/ML ON CALL CATH LAB
INTRAVENOUS | Status: AC
  Filled 2012-06-26: qty 1

## 2012-06-26 MED ORDER — ASPIRIN EC 325 MG PO TBEC
325.0000 mg | DELAYED_RELEASE_TABLET | Freq: Every day | ORAL | Status: DC
  Administered 2012-06-27 – 2012-06-28 (×2): 325 mg via ORAL
  Filled 2012-06-26 (×4): qty 1

## 2012-06-26 MED ORDER — METOPROLOL TARTRATE 12.5 MG HALF TABLET
12.5000 mg | ORAL_TABLET | Freq: Once | ORAL | Status: AC
  Administered 2012-06-29: 12.5 mg via ORAL
  Filled 2012-06-26: qty 1

## 2012-06-26 MED ORDER — DIAZEPAM 2 MG PO TABS: 2.0000 mg | ORAL_TABLET | ORAL | Status: DC | PRN

## 2012-06-26 MED ORDER — HEPARIN (PORCINE) IN NACL 2-0.9 UNIT/ML-% IJ SOLN
INTRAMUSCULAR | Status: AC
  Filled 2012-06-26: qty 1000

## 2012-06-26 MED ORDER — ONDANSETRON HCL 4 MG/2ML IJ SOLN: 4.0000 mg | Freq: Four times a day (QID) | INTRAMUSCULAR | Status: DC | PRN

## 2012-06-26 MED ORDER — HEPARIN SODIUM (PORCINE) 1000 UNIT/ML IJ SOLN
INTRAMUSCULAR | Status: AC
  Filled 2012-06-26: qty 1

## 2012-06-26 MED ORDER — LIDOCAINE HCL (PF) 1 % IJ SOLN
INTRAMUSCULAR | Status: AC
  Filled 2012-06-26: qty 30

## 2012-06-26 MED ORDER — FENTANYL CITRATE 0.05 MG/ML IJ SOLN
INTRAMUSCULAR | Status: AC
  Filled 2012-06-26: qty 2

## 2012-06-26 MED ORDER — ACETAMINOPHEN 325 MG PO TABS: 650.0000 mg | ORAL_TABLET | ORAL | Status: DC | PRN

## 2012-06-26 MED ORDER — VERAPAMIL HCL 2.5 MG/ML IV SOLN
INTRAVENOUS | Status: AC
  Filled 2012-06-26: qty 2

## 2012-06-26 MED ORDER — BISACODYL 5 MG PO TBEC
5.0000 mg | DELAYED_RELEASE_TABLET | Freq: Once | ORAL | Status: AC
  Administered 2012-06-28: 5 mg via ORAL
  Filled 2012-06-26: qty 1

## 2012-06-26 MED ORDER — SODIUM CHLORIDE 0.45 % IV SOLN
INTRAVENOUS | Status: AC
  Administered 2012-06-26: 11:00:00 via INTRAVENOUS

## 2012-06-26 MED ORDER — CHLORHEXIDINE GLUCONATE 4 % EX LIQD
60.0000 mL | Freq: Once | CUTANEOUS | Status: DC
  Filled 2012-06-26: qty 60

## 2012-06-26 MED ORDER — HEPARIN (PORCINE) IN NACL 100-0.45 UNIT/ML-% IJ SOLN
1350.0000 [IU]/h | INTRAMUSCULAR | Status: DC
  Administered 2012-06-26: 1200 [IU]/h via INTRAVENOUS
  Administered 2012-06-28: 1350 [IU]/h via INTRAVENOUS
  Administered 2012-06-28: 1200 [IU]/h via INTRAVENOUS
  Administered 2012-06-29: 1350 [IU]/h via INTRAVENOUS
  Filled 2012-06-26 (×8): qty 250

## 2012-06-26 MED ORDER — MIDAZOLAM HCL 2 MG/2ML IJ SOLN
INTRAMUSCULAR | Status: AC
  Filled 2012-06-26: qty 2

## 2012-06-26 MED ORDER — TEMAZEPAM 15 MG PO CAPS
15.0000 mg | ORAL_CAPSULE | Freq: Once | ORAL | Status: AC | PRN
  Administered 2012-06-28: 15 mg via ORAL
  Filled 2012-06-26: qty 1

## 2012-06-26 MED ORDER — OXYCODONE-ACETAMINOPHEN 5-325 MG PO TABS: 1.0000 | ORAL_TABLET | ORAL | Status: DC | PRN

## 2012-06-26 MED ORDER — METOPROLOL TARTRATE 1 MG/ML IV SOLN
INTRAVENOUS | Status: AC
  Filled 2012-06-26: qty 5

## 2012-06-26 MED ORDER — DIAZEPAM 5 MG PO TABS
5.0000 mg | ORAL_TABLET | Freq: Once | ORAL | Status: AC
  Administered 2012-06-29: 5 mg via ORAL
  Filled 2012-06-26: qty 1

## 2012-06-26 NOTE — Interval H&P Note (Signed)
History and Physical Interval Note:  06/26/2012 9:33 AM  Terry Hansen  has presented today for surgery, with the diagnosis of Chest pain  The various methods of treatment have been discussed with the patient and family. After consideration of risks, benefits and other options for treatment, the patient has consented to  Procedure(s) (LRB) with comments: LEFT HEART CATHETERIZATION WITH CORONARY ANGIOGRAM (N/A) as a surgical intervention .  The patient's history has been reviewed, patient examined, no change in status, stable for surgery.  I have reviewed the patient's chart and labs.  Questions were answered to the patient's satisfaction.     Charlton Haws

## 2012-06-26 NOTE — Progress Notes (Signed)
Patient ID: Terry Hansen, male   DOB: 22-Nov-1955, 56 y.o.   MRN: [id number]    Subjective:  Denies SSCP, palpitations or Dyspnea Headache is gone  Objective:  Filed Vitals:   06/25/12 2145 06/25/12 2200 06/25/12 2250 06/26/12 0543  BP:   114/76 112/67  Pulse: 73 71 72 75  Temp:   98.4 F (36.9 C) 98.5 F (36.9 C)  TempSrc:   Oral Oral  Resp: 19 22 18 19   Height:   5\' 11"  (1.803 m)   Weight:   189 lb 9.5 oz (86 kg)   SpO2: 97% 95% 95% 92%    Intake/Output from previous day:  Intake/Output Summary (Last 24 hours) at 06/26/12 1191 Last data filed at 06/26/12 0600  Gross per 24 hour  Intake 1779.17 ml  Output      0 ml  Net 1779.17 ml    Physical Exam: Affect appropriate Healthy:  appears stated age HEENT: normal Neck supple with no adenopathy JVP normal no bruits no thyromegaly Lungs clear with no wheezing and good diaphragmatic motion Heart:  S1/S2 no murmur, no rub, gallop or click PMI normal Abdomen: benighn, BS positve, no tenderness, no AAA no bruit.  No HSM or HJR Distal pulses intact with no bruits No edema Neuro non-focal Skin warm and dry No muscular weakness   Lab Results: Basic Metabolic Panel:  Basename 06/26/12 0450 06/25/12 1105 06/25/12 0655  NA 135 126* --  K 4.2 3.4* --  CL 102 90* --  CO2 23 -- 28  GLUCOSE 95 104* --  BUN 10 12 --  CREATININE 0.84 1.00 --  CALCIUM 8.7 -- 8.6  MG -- -- --  PHOS -- -- --   Liver Function Tests:  [facility] 06/25/12 2348  AST 12  ALT 15  ALKPHOS 92  BILITOT 0.3  PROT 6.2  ALBUMIN 3.3*   No results found for this basename: LIPASE:2,AMYLASE:2 in the last 72 hours CBC:  Basename 06/26/12 0450 06/25/12 1105 06/25/12 0620  WBC 8.7 -- 10.0  NEUTROABS -- -- 4.7  HGB 16.4 16.3 --  HCT 44.9 48.0 --  MCV 81.9 -- 77.6*  PLT 271 -- 310   Cardiac Enzymes:  Basename 06/26/12 0450 06/25/12 2347 06/25/12 0620  CKTOTAL -- -- --  CKMB -- -- --  CKMBINDEX -- -- --  TROPONINI <0.30 <0.30 <0.30    Fasting Lipid Panel:  Basename 06/26/12 0450  CHOL 145  HDL 40  LDLCALC 88  TRIG 83  CHOLHDL 3.6  LDLDIRECT --    Imaging: Dg Chest 2 View  06/25/2012  *RADIOLOGY REPORT*  Clinical Data: Chest pain and cough.  CHEST - 2 VIEW  Comparison: None.  Findings: The heart size and pulmonary vascularity are normal and the lungs are clear.  No osseous abnormality.  IMPRESSION: Normal chest.   Original Report Authenticated By: Francene Boyers, M.D.    Ct Heart Morp W/cta Cor W/score W/ca W/cm &/or Wo/cm  06/25/2012  *RADIOLOGY REPORT*  INDICATION: Upper chest pain.  Severe family history of coronary artery disease.  Hyperlipidemia and hypertension.  CT ANGIOGRAPHY OF THE HEART, CORONARY ARTERY, STRUCTURE, AND MORPHOLOGY  COMPARISON:  Plain film of earlier in the day.  No prior CT.  CONTRAST: 80mL OMNIPAQUE IOHEXOL 350 MG/ML SOLN  TECHNIQUE:  CT angiography of the coronary vessels was performed on a 256 channel system using prospective ECG gating.  A scout and ECG- gated noncontrast exam (for calcium scoring) were performed. Appropriate delay was determined by bolus tracking  after injection of iodinated contrast, and an ECG-gated coronary CTA was performed with sub-mm slice collimation during late diastole.  Imaging post processing was performed on an independent workstation creating multiplanar and 3-D images, allowing for quantitative analysis of the heart and coronary arteries.  Note that this exam targets the heart and the chest was not imaged in its entirety.  PREMEDICATION: Lopressor  100 mg, P.O. Lopressor  2.5 mg, IV Nitroglycerin  0.4  mcg, sublingual.  FINDINGS: Technical quality: Good. Heart rate:  60.  CORONARY ARTERIES: Left Main:              Absent (separate origin of the LAD and left circumflex.) LAD:              Has calcified and noncalcified plaque just distal to its origin.  Positive remodeling.  No stenosis proximally.  Just distal to the origin of a first diagonal is a calcified and  noncalcified plaque which causes greater than 75% stenosis. Opacified distally with areas of primarily calcified plaque identified. Diagonals:              Patent diminutive first diagonal. Extensive calcified and noncalcified plaque within the second diagonal proximally.  This branches and has both calcified and noncalcified plaque throughout its distal course.  Patent. LCx:              Large, dominant vessel which arises immediately posterior to the LAD from the aorta.  Mild misregistration in its mid to distal course.  Without significant stenosis, continues distally to supply posterolateral branches and the PDA. OMs:              Branching large first marginal which has calcified plaque in its proximal and midcourse with positive remodeling.  No definite stenosis.  Mild misregistration. A second marginal is moderate sized patent. RCA:              Small to moderate sized, nondominant vessel. Calcified and noncalcified plaque with positive remodeling just distal to its origin.  Poorly evaluated in its mid to distal course secondary to small size and misregistration artifact.  No definite high-grade stenosis. PDA:                    Moderate sized and supplied by the left circumflex. Dominance:        Left-sided.  CORONARY CALCIUM: Total Agatston Score:         151 (68.7 in the LAD, 61 in the diagonals, 12.70 in the left circumflex, and 8.9 in the RCA. MESA database percentile:     79th, corresponding to an "arterial age" of 73 years.  AORTA AND PULMONARY MEASUREMENTS:  Aortic root (21 - 40 mm):     25 mm at the annulus                               36 mm at the sinuses of Valsalva                               22 mm at the sinotubular  junction                                Ascending aorta:  (< 40 mm):  25 mm Descending aorta:  (< 40 mm): 24 mm Main pulmonary artery: (<  30 mm): 25 mm  OTHER FINDINGS: Lungs/pleura: Volume loss and subsegmental atelectasis or scarring at the right greater than left lung bases.   No pleural fluid.  Heart/Mediastinum: No imaged thoracic adenopathy.  No aortic dissection. No central pulmonary embolism, on this non-dedicated study.  No cardiac mass.  No left atrial appendage thrombus. Calcifications involving the aortic valve leaflets, most apparent on coronal image 30/series 8088.  Upper abdomen: No significant findings.  Bones/Musculoskeletal:  No acute osseous abnormality.  IMPRESSION:  1.  Age advanced, obstructive coronary artery disease.  There is mixed plaque in the proximal LAD that results in greater than 75% luminal stenosis. 2.  Other areas of extensive mixed plaque with foci of positive remodeling. 3.  Separate origin of the LAD and left circumflex, left dominant system. 4.  Aortic valvular calcifications.  Consider non emergent echocardiography to exclude aortic valve disease. 5.  Suboptimal evaluation, especially of the right coronary artery secondary to mild misregistration artifact.  Report was called to CDU mid level, Sherry at 3:27 p.m. on 06/25/2012.   Original Report Authenticated By: Jeronimo Greaves, M.D.     Cardiac Studies:  ECG:   SR rate 66 normal   Telemetry:  NSR no VT  06/26/2012   Echo:   Medications:     . [COMPLETED] sodium chloride   Intravenous Once  . [COMPLETED] acetaminophen  650 mg Oral Once  . [COMPLETED] aspirin  324 mg Oral Pre-Cath  . aspirin  81 mg Oral Daily  . atorvastatin  40 mg Oral q1800  . loratadine  10 mg Oral Daily  . [COMPLETED] metoprolol  2.5 mg Intravenous Once  . [COMPLETED] metoprolol tartrate  100 mg Oral Once  . [COMPLETED]  morphine injection  2 mg Intravenous Once  . [COMPLETED] nitroGLYCERIN  0.4 mg Sublingual Once  . [COMPLETED] potassium chloride  10 mEq Intravenous Once  . [COMPLETED] potassium chloride  40 mEq Oral Once  . [COMPLETED] potassium chloride  40 mEq Oral Once  . propranolol ER  60 mg Oral Daily       . sodium chloride 1,000 mL (06/25/12 1813)  . [DISCONTINUED] sodium chloride Stopped  (06/25/12 1806)    Assessment/Plan:  Hyponatremia:  Improved stop chrorthaladone.   Migraine:  Improved.  No scotoma PRN percocet Chol:  Continue statin Chest Pain:  R/O ECG low risk cath later today Risks discussed willing to proceed  Cardiac CT reviewed.  Worrisome mid LAD mixed plaque  Charlton Haws 06/26/2012, 7:14 AM

## 2012-06-26 NOTE — Progress Notes (Signed)
  Echocardiogram 2D Echocardiogram has been performed.  Terry Hansen [name] 06/26/2012, 4:39 PM

## 2012-06-26 NOTE — Progress Notes (Signed)
ANTICOAGULATION CONSULT NOTE - Initial Consult  Pharmacy Consult for heparin Indication: chest pain/ACS  No Known Allergies  Patient Measurements: Height: 5\' 11"  (180.3 cm) Weight: 189 lb 9.5 oz (86 kg) IBW/kg (Calculated) : 75.3  Heparin Dosing Weight: 80 kg  Vital Signs: Temp: 98.5 F (36.9 C) (12/06 0543) Temp src: Oral (12/06 0543) BP: 112/67 mmHg (12/06 0543) Pulse Rate: 75  (12/06 0543)  Labs:  Alvira Philips 06/26/12 0450 06/25/12 2348 06/25/12 2347 06/25/12 1105 06/25/12 0655 06/25/12 0620  HGB 16.4 -- -- 16.3 -- --  HCT 44.9 -- -- 48.0 -- 44.6  PLT 271 -- -- -- -- 310  APTT -- -- -- -- -- --  LABPROT -- 13.3 -- -- -- --  INR -- 1.02 -- -- -- --  HEPARINUNFRC -- -- -- -- -- --  CREATININE 0.84 -- -- 1.00 0.80 --  CKTOTAL -- -- -- -- -- --  CKMB -- -- -- -- -- --  TROPONINI <0.30 -- <0.30 -- -- <0.30    Estimated Creatinine Clearance: 102.1 ml/min (by C-G formula based on Cr of 0.84).   Medical History: Past Medical History  Diagnosis Date  . Hypertension   . Ocular migraine     Medications:  Prescriptions prior to admission  Medication Sig Dispense Refill  . cetirizine (ZYRTEC) 10 MG tablet Take 10 mg by mouth daily.      . chlorthalidone (HYGROTON) 25 MG tablet Take 25 mg by mouth daily.      . Coenzyme Q10 200 MG capsule Take 200 mg by mouth daily. disontinue while in hospital      . ibuprofen (ADVIL,MOTRIN) 200 MG tablet Take 600 mg by mouth every 6 (six) hours as needed. migraine headache      . predniSONE (DELTASONE) 10 MG tablet Take 20 mg by mouth daily. Patient course completed 06-24-2012      . aspirin 81 MG tablet Take 81 mg by mouth daily.      . diphenhydrAMINE (BENADRYL) 50 MG capsule Take 50 mg by mouth at bedtime. For sleep        Assessment: 56 yo man s/p cath to start heparin.  Needs evaluation from CVTS for arterial grafting. Goal of Therapy:  Heparin level 0.3-0.7 units/ml Monitor platelets by anticoagulation protocol: Yes   Plan:   Start heparin no bolus 6 hours after sheath pulled at 1200 units/hr Check HL 8 hours after start. Daily HL/CBC while on heparin.  Taeya Theall Poteet 06/26/2012,1:23 PM

## 2012-06-26 NOTE — Consult Note (Signed)
Reason for Consult:2 vessel CAD Referring Physician: Dr. Glenna Hansen Terry Hansen is an 56 y.o. male.  HPI: 56 yo WM who presents with a cc/o CP.   Terry Hansen is a 56 y/o M with history of HTN and a strong family history of CAD (brother had CABG age 90 with later redo CABG). He has been feeling poorly since having viral illness in September. He has had intermittent dry cough and nasal drainage. He was recently given a short course of prednisone and was started on Zyrtec and chlorthalidone.   Yesterday after work he began to experience scotoma. It resolved but then recurred and was followed by a severe headache. He felt weak, shaky, and dizzy. He took Advil and ate dinner with some relief. This morning around 4:45am, he was awakened from sleep with substernal chest pain/tightness and feeling of being generally weak. + nausea, no vomiting, SOB or diaphoresis. The pain did not radiate. The episode lasted 1.5 hours and he came to the ED. He was fund to be hyponatremic (123) and hypokalemic (2.8). Cardiac enzymes were negative x 2. An EKG showed IRBBB with nonspecific ST-T changes. Cardiac CT showed age advanced obstructive coronary artery disease with mixed plaque in the proximal LAD that results in greater than 75% luminal stenosis, along with other areas of extensive mixed plaque. It also showed aortic valvular calcifications.   He underwent cardiac catheterization which showed severe 2 vessel CAD with a totally occluded nondominant RCA, and severe stenosis in the proximal LAD, involving the takeoff of a large D1. The dominant circumflex had multiple non flow-limiting plaques. LAD and LCX arose from separate ostia.  He currently is pain free.  Past Medical History  Diagnosis Date  . Hypertension   . Ocular migraine     Past Surgical History  Procedure Date  . Appendectomy   . Nasal septum surgery     For uncontrolled epistaxis early 2013    Family History  Problem Relation Age of Onset  .  Heart disease Brother     CABG age 19, MI ~14, redo bypass ~50  . Heart disease Paternal Grandmother   . Heart disease Cousin     Maternal side    Social History:  reports that he has never smoked. He does not have any smokeless tobacco history on file. He reports that he drinks alcohol. He reports that he does not use illicit drugs.  Allergies: No Known Allergies  Medications:  Prior to Admission:  Prescriptions prior to admission  Medication Sig Dispense Refill  . cetirizine (ZYRTEC) 10 MG tablet Take 10 mg by mouth daily.      . chlorthalidone (HYGROTON) 25 MG tablet Take 25 mg by mouth daily.      . Coenzyme Q10 200 MG capsule Take 200 mg by mouth daily. disontinue while in hospital      . ibuprofen (ADVIL,MOTRIN) 200 MG tablet Take 600 mg by mouth every 6 (six) hours as needed. migraine headache      . predniSONE (DELTASONE) 10 MG tablet Take 20 mg by mouth daily. Patient course completed 06-24-2012      . aspirin 81 MG tablet Take 81 mg by mouth daily.      . diphenhydrAMINE (BENADRYL) 50 MG capsule Take 50 mg by mouth at bedtime. For sleep        Results for orders placed during the hospital encounter of 06/25/12 (from the past 48 hour(s))  CBC WITH DIFFERENTIAL     Status: Abnormal  Collection Time   06/25/12  6:20 AM      Component Value Range Comment   WBC 10.0  4.0 - 10.5 K/uL    RBC 5.75  4.22 - 5.81 MIL/uL    Hemoglobin 17.1 (*) 13.0 - 17.0 g/dL    HCT 16.1  09.6 - 04.5 %    MCV 77.6 (*) 78.0 - 100.0 fL    MCH 29.7  26.0 - 34.0 pg    MCHC 38.3 (*) 30.0 - 36.0 g/dL RULED OUT INTERFERING SUBSTANCES   RDW 12.4  11.5 - 15.5 %    Platelets 310  150 - 400 K/uL    Neutrophils Relative 47  43 - 77 %    Neutro Abs 4.7  1.7 - 7.7 K/uL    Lymphocytes Relative 40  12 - 46 %    Lymphs Abs 4.0  0.7 - 4.0 K/uL    Monocytes Relative 11  3 - 12 %    Monocytes Absolute 1.1 (*) 0.1 - 1.0 K/uL    Eosinophils Relative 2  0 - 5 %    Eosinophils Absolute 0.2  0.0 - 0.7 K/uL     Basophils Relative 0  0 - 1 %    Basophils Absolute 0.0  0.0 - 0.1 K/uL    Smear Review MORPHOLOGY UNREMARKABLE     BASIC METABOLIC PANEL     Status: Abnormal   Collection Time   06/25/12  6:20 AM      Component Value Range Comment   Sodium 124 (*) 135 - 145 mEq/L    Potassium 2.8 (*) 3.5 - 5.1 mEq/L    Chloride 86 (*) 96 - 112 mEq/L    CO2 29  19 - 32 mEq/L    Glucose, Bld 105 (*) 70 - 99 mg/dL    BUN 16  6 - 23 mg/dL    Creatinine, Ser 4.09  0.50 - 1.35 mg/dL    Calcium 8.8  8.4 - 81.1 mg/dL    GFR calc non Af Amer >90  >90 mL/min    GFR calc Af Amer >90  >90 mL/min   TROPONIN I     Status: Normal   Collection Time   06/25/12  6:20 AM      Component Value Range Comment   Troponin I <0.30  <0.30 ng/mL   BASIC METABOLIC PANEL     Status: Abnormal   Collection Time   06/25/12  6:55 AM      Component Value Range Comment   Sodium 123 (*) 135 - 145 mEq/L    Potassium 3.0 (*) 3.5 - 5.1 mEq/L    Chloride 87 (*) 96 - 112 mEq/L    CO2 28  19 - 32 mEq/L    Glucose, Bld 133 (*) 70 - 99 mg/dL    BUN 15  6 - 23 mg/dL    Creatinine, Ser 9.14  0.50 - 1.35 mg/dL    Calcium 8.6  8.4 - 78.2 mg/dL    GFR calc non Af Amer >90  >90 mL/min    GFR calc Af Amer >90  >90 mL/min   POCT I-STAT TROPONIN I     Status: Normal   Collection Time   06/25/12 11:02 AM      Component Value Range Comment   Troponin i, poc 0.00  0.00 - 0.08 ng/mL    Comment 3            POCT I-STAT, CHEM 8     Status:  Abnormal   Collection Time   06/25/12 11:05 AM      Component Value Range Comment   Sodium 126 (*) 135 - 145 mEq/L    Potassium 3.4 (*) 3.5 - 5.1 mEq/L    Chloride 90 (*) 96 - 112 mEq/L    BUN 12  6 - 23 mg/dL    Creatinine, Ser 1.61  0.50 - 1.35 mg/dL    Glucose, Bld 096 (*) 70 - 99 mg/dL    Calcium, Ion 0.45 (*) 1.12 - 1.23 mmol/L    TCO2 26  0 - 100 mmol/L    Hemoglobin 16.3  13.0 - 17.0 g/dL    HCT 40.9  81.1 - 91.4 %   TROPONIN I     Status: Normal   Collection Time   06/25/12 11:47 PM       Component Value Range Comment   Troponin I <0.30  <0.30 ng/mL   PROTIME-INR     Status: Normal   Collection Time   06/25/12 11:48 PM      Component Value Range Comment   Prothrombin Time 13.3  11.6 - 15.2 seconds    INR 1.02  0.00 - 1.49   HEMOGLOBIN A1C     Status: Normal   Collection Time   06/25/12 11:48 PM      Component Value Range Comment   Hemoglobin A1C 5.6  <5.7 %    Mean Plasma Glucose 114  <117 mg/dL   HEPATIC FUNCTION PANEL     Status: Abnormal   Collection Time   06/25/12 11:48 PM      Component Value Range Comment   Total Protein 6.2  6.0 - 8.3 g/dL    Albumin 3.3 (*) 3.5 - 5.2 g/dL    AST 12  0 - 37 U/L    ALT 15  0 - 53 U/L    Alkaline Phosphatase 92  39 - 117 U/L    Total Bilirubin 0.3  0.3 - 1.2 mg/dL    Bilirubin, Direct <7.8  0.0 - 0.3 mg/dL    Indirect Bilirubin NOT CALCULATED  0.3 - 0.9 mg/dL   TROPONIN I     Status: Normal   Collection Time   06/26/12  4:50 AM      Component Value Range Comment   Troponin I <0.30  <0.30 ng/mL   CBC     Status: Abnormal   Collection Time   06/26/12  4:50 AM      Component Value Range Comment   WBC 8.7  4.0 - 10.5 K/uL    RBC 5.48  4.22 - 5.81 MIL/uL    Hemoglobin 16.4  13.0 - 17.0 g/dL    HCT 29.5  62.1 - 30.8 %    MCV 81.9  78.0 - 100.0 fL    MCH 29.9  26.0 - 34.0 pg    MCHC 36.5 (*) 30.0 - 36.0 g/dL    RDW 65.7  84.6 - 96.2 %    Platelets 271  150 - 400 K/uL   BASIC METABOLIC PANEL     Status: Normal   Collection Time   06/26/12  4:50 AM      Component Value Range Comment   Sodium 135  135 - 145 mEq/L DELTA CHECK NOTED   Potassium 4.2  3.5 - 5.1 mEq/L    Chloride 102  96 - 112 mEq/L DELTA CHECK NOTED   CO2 23  19 - 32 mEq/L    Glucose, Bld 95  70 -  99 mg/dL    BUN 10  6 - 23 mg/dL    Creatinine, Ser 1.61  0.50 - 1.35 mg/dL    Calcium 8.7  8.4 - 09.6 mg/dL    GFR calc non Af Amer >90  >90 mL/min    GFR calc Af Amer >90  >90 mL/min   LIPID PANEL     Status: Normal   Collection Time   06/26/12  4:50 AM       Component Value Range Comment   Cholesterol 145  0 - 200 mg/dL    Triglycerides 83  <045 mg/dL    HDL 40  >40 mg/dL    Total CHOL/HDL Ratio 3.6      VLDL 17  0 - 40 mg/dL    LDL Cholesterol 88  0 - 99 mg/dL     Dg Chest 2 View  98/07/1912  *RADIOLOGY REPORT*  Clinical Data: Chest pain and cough.  CHEST - 2 VIEW  Comparison: None.  Findings: The heart size and pulmonary vascularity are normal and the lungs are clear.  No osseous abnormality.  IMPRESSION: Normal chest.   Original Report Authenticated By: Francene Boyers, M.D.    Ct Heart Morp W/cta Cor W/score W/ca W/cm &/or Wo/cm  06/25/2012  *RADIOLOGY REPORT*  INDICATION: Upper chest pain.  Severe family history of coronary artery disease.  Hyperlipidemia and hypertension.  CT ANGIOGRAPHY OF THE HEART, CORONARY ARTERY, STRUCTURE, AND MORPHOLOGY  COMPARISON:  Plain film of earlier in the day.  No prior CT.  CONTRAST: 80mL OMNIPAQUE IOHEXOL 350 MG/ML SOLN  TECHNIQUE:  CT angiography of the coronary vessels was performed on a 256 channel system using prospective ECG gating.  A scout and ECG- gated noncontrast exam (for calcium scoring) were performed. Appropriate delay was determined by bolus tracking after injection of iodinated contrast, and an ECG-gated coronary CTA was performed with sub-mm slice collimation during late diastole.  Imaging post processing was performed on an independent workstation creating multiplanar and 3-D images, allowing for quantitative analysis of the heart and coronary arteries.  Note that this exam targets the heart and the chest was not imaged in its entirety.  PREMEDICATION: Lopressor  100 mg, P.O. Lopressor  2.5 mg, IV Nitroglycerin  0.4  mcg, sublingual.  FINDINGS: Technical quality: Good. Heart rate:  60.  CORONARY ARTERIES: Left Main:              Absent (separate origin of the LAD and left circumflex.) LAD:              Has calcified and noncalcified plaque just distal to its origin.  Positive remodeling.  No stenosis  proximally.  Just distal to the origin of a first diagonal is a calcified and noncalcified plaque which causes greater than 75% stenosis. Opacified distally with areas of primarily calcified plaque identified. Diagonals:              Patent diminutive first diagonal. Extensive calcified and noncalcified plaque within the second diagonal proximally.  This branches and has both calcified and noncalcified plaque throughout its distal course.  Patent. LCx:              Large, dominant vessel which arises immediately posterior to the LAD from the aorta.  Mild misregistration in its mid to distal course.  Without significant stenosis, continues distally to supply posterolateral branches and the PDA. OMs:              Branching large first marginal which has  calcified plaque in its proximal and midcourse with positive remodeling.  No definite stenosis.  Mild misregistration. A second marginal is moderate sized patent. RCA:              Small to moderate sized, nondominant vessel. Calcified and noncalcified plaque with positive remodeling just distal to its origin.  Poorly evaluated in its mid to distal course secondary to small size and misregistration artifact.  No definite high-grade stenosis. PDA:                    Moderate sized and supplied by the left circumflex. Dominance:        Left-sided.  CORONARY CALCIUM: Total Agatston Score:         151 (68.7 in the LAD, 61 in the diagonals, 12.70 in the left circumflex, and 8.9 in the RCA. MESA database percentile:     79th, corresponding to an "arterial age" of 28 years.  AORTA AND PULMONARY MEASUREMENTS:  Aortic root (21 - 40 mm):     25 mm at the annulus                               36 mm at the sinuses of Valsalva                               22 mm at the sinotubular  junction                                Ascending aorta:  (< 40 mm):  25 mm Descending aorta:  (< 40 mm): 24 mm Main pulmonary artery: (< 30 mm): 25 mm  OTHER FINDINGS: Lungs/pleura: Volume loss and  subsegmental atelectasis or scarring at the right greater than left lung bases.  No pleural fluid.  Heart/Mediastinum: No imaged thoracic adenopathy.  No aortic dissection. No central pulmonary embolism, on this non-dedicated study.  No cardiac mass.  No left atrial appendage thrombus. Calcifications involving the aortic valve leaflets, most apparent on coronal image 30/series 8088.  Upper abdomen: No significant findings.  Bones/Musculoskeletal:  No acute osseous abnormality.  IMPRESSION:  1.  Age advanced, obstructive coronary artery disease.  There is mixed plaque in the proximal LAD that results in greater than 75% luminal stenosis. 2.  Other areas of extensive mixed plaque with foci of positive remodeling. 3.  Separate origin of the LAD and left circumflex, left dominant system. 4.  Aortic valvular calcifications.  Consider non emergent echocardiography to exclude aortic valve disease. 5.  Suboptimal evaluation, especially of the right coronary artery secondary to mild misregistration artifact.  Report was called to CDU mid level, Sherry at 3:27 p.m. on 06/25/2012.   Original Report Authenticated By: Jeronimo Greaves, M.D.     Review of Systems  Constitutional: Positive for malaise/fatigue.  HENT: Positive for nosebleeds.        Sinus surgery  Respiratory: Positive for cough and shortness of breath.   Cardiovascular: Positive for chest pain.  Gastrointestinal: Positive for nausea.  Genitourinary: Negative.   Musculoskeletal: Negative.   Neurological: Positive for dizziness, tremors and headaches.  Endo/Heme/Allergies: Negative.   All other systems reviewed and are negative.   Blood pressure 117/77, pulse 92, temperature 98.1 F (36.7 Hansen), temperature source Oral, resp. rate 18, height 5\' 11"  (1.803 m), weight 189 lb  9.5 oz (86 kg), SpO2 97.00%. Physical Exam  Vitals reviewed. Constitutional: He is oriented to person, place, and time. He appears well-developed and well-nourished.  HENT:  Head:  Normocephalic and atraumatic.  Eyes: EOM are normal. Pupils are equal, round, and reactive to light.  Neck: Neck supple. No thyromegaly present.       No carotid bruits  Cardiovascular: Normal rate, regular rhythm, normal heart sounds and intact distal pulses.        Normal Allen's test left hand  Respiratory: Breath sounds normal. He has no wheezes. He has no rales.  GI: Soft. There is no tenderness.  Musculoskeletal: Normal range of motion. He exhibits no edema.  Lymphadenopathy:    He has no cervical adenopathy.  Neurological: He is alert and oriented to person, place, and time. No cranial nerve deficit.  Skin: Skin is warm and dry.    Assessment/Plan: 56 yo WM with unstable angina and severe 2 vessel CAD by catheterization. CAD involves a nondominant RCA- unclear if there are any graftable targets in that distribution. Also complex proximal LAD/ Diagonal disease not amenable to PTCA. We will definitely graft LAD and diagonal and will graft RCA branch if possible.  CABG indicated for symptom relief and survival benefit.  I have discussed with the patient the general nature of the procedure, need for general anesthesia, and incisions to be used. We discussed possible use of the left radial artery, as he does have a normal Allen's test. We will assess intraoperatively to determine whether sequential LIMA grafting of diagonal and LAD is possible. I also discussed the expected hospital stay, overall recovery and short and long term outcomes. He understands the risks include but are not limited to death, stroke, MI, DVT/PE, bleeding, possible need for transfusion, infections, cardiac arrhythmias, and other organ system dysfunction including respiratory, renal, or GI complications. He accepts these risks and agrees to proceed.  For CABG, possible left radial artery harvest Monday 12/9  Terry Hansen 06/26/2012, 2:13 PM

## 2012-06-26 NOTE — CV Procedure (Signed)
Cardiac Catheterization Procedure Note  Name: Terry Hansen MRN: [id number] DOB: 1956-07-11  Procedure: Left Heart Cath, Selective Coronary Angiography, LV angiography  Indication:  Unstable angina   Procedural Details: The right wrist was prepped, draped, and anesthetized with 1% lidocaine. Using the modified Seldinger technique, a 5 French sheath was introduced into the right radial artery. 3 mg of verapamil was administered through the sheath, weight-based unfractionated heparin was administered intravenously. Standard Judkins catheters were used for selective coronary angiography and left ventriculography. Catheter exchanges were performed over an exchange length guidewire. There were no immediate procedural complications. A TR band was used for radial hemostasis at the completion of the procedure.  The patient was transferred to the post catheterization recovery area for further monitoring.  Coronary Arteries: Right dominant with no anomalies  LM:  No LM separate ostia of LAD and circumflex  LAD: Separate ostia higher than circumflex.  90% proximal lesion at take off of large D1 mid and distal vessel normal  Circumflex: 20-30% proximal mid and distal.  Left dominant  OM1- 20-30% multiple discrete lesions  OM2- 20-30% multiple discrete lesions  OM3- 20-30% multiple discrete lesions   PDA/PLA:  Normal  RCA:  Nondominant  100% ostial occlusion with faint bridging collaterals  Ventriculography: EF: 55 %,    Hemodynamics:  Aortic Pressure: 135 68 mmHg  LV Pressure: 140 14  mmHg   Final Conclusions:  Severe CAD  Recommendations: Films reviewed with Dr [place].  He is concerned about the Diagonal being lost with stenting of LAD. Agree best option is Arterial grafting of LAD/D1 Will call CVTS.  To angina during case.    Charlton Haws 06/26/2012, 10:16 AM

## 2012-06-27 DIAGNOSIS — Z01810 Encounter for preprocedural cardiovascular examination: Secondary | ICD-10-CM

## 2012-06-27 DIAGNOSIS — I2510 Atherosclerotic heart disease of native coronary artery without angina pectoris: Secondary | ICD-10-CM | POA: Insufficient documentation

## 2012-06-27 LAB — CBC
HCT: 46 % (ref 39.0–52.0)
MCH: 29.4 pg (ref 26.0–34.0)
MCHC: 35.4 g/dL (ref 30.0–36.0)
MCV: 83 fL (ref 78.0–100.0)
RDW: 12.5 % (ref 11.5–15.5)

## 2012-06-27 LAB — BASIC METABOLIC PANEL
BUN: 12 mg/dL (ref 6–23)
Creatinine, Ser: 0.81 mg/dL (ref 0.50–1.35)
GFR calc Af Amer: >90 mL/min (ref >90)
GFR calc non Af Amer: >90 mL/min (ref >90)

## 2012-06-27 NOTE — Progress Notes (Signed)
ANTICOAGULATION CONSULT NOTE - Follow Up Consult  Pharmacy Consult for heparin Indication: CAD awaiting CABG  Labs:  Basename 06/27/12 0828 06/27/12 0455 06/27/12 0102 06/26/12 0450 06/25/12 2348 06/25/12 2347 06/25/12 1105 06/25/12 0620  HGB -- 16.3 -- 16.4 -- -- -- --  HCT -- 46.0 -- 44.9 -- -- 48.0 --  PLT -- 247 -- 271 -- -- -- 310  APTT -- -- -- -- -- -- -- --  LABPROT -- -- -- -- 13.3 -- -- --  INR -- -- -- -- 1.02 -- -- --  HEPARINUNFRC 0.39 -- 0.53 -- -- -- -- --  CREATININE -- 0.81 -- 0.84 -- -- 1.00 --  CKTOTAL -- -- -- -- -- -- -- --  CKMB -- -- -- -- -- -- -- --  TROPONINI -- -- -- <0.30 -- <0.30 -- <0.30    Assessment/Plan:  56yo male therapeutic on heparin with initial dosing while awaiting CABG.  Will continue gtt at current rate.  F/u heparin level in am.    Wendie Simmer, PharmD, BCPS Clinical Pharmacist  Pager: [phone]

## 2012-06-27 NOTE — Progress Notes (Signed)
ANTICOAGULATION CONSULT NOTE - Follow Up Consult  Pharmacy Consult for heparin Indication: CAD awaiting CABG  Labs:  Basename 06/27/12 0102 06/26/12 0450 06/25/12 2348 06/25/12 2347 06/25/12 1105 06/25/12 0655 06/25/12 0620  HGB -- 16.4 -- -- 16.3 -- --  HCT -- 44.9 -- -- 48.0 -- 44.6  PLT -- 271 -- -- -- -- 310  APTT -- -- -- -- -- -- --  LABPROT -- -- 13.3 -- -- -- --  INR -- -- 1.02 -- -- -- --  HEPARINUNFRC 0.53 -- -- -- -- -- --  CREATININE -- 0.84 -- -- 1.00 0.80 --  CKTOTAL -- -- -- -- -- -- --  CKMB -- -- -- -- -- -- --  TROPONINI -- <0.30 -- <0.30 -- -- <0.30    Assessment/Plan:  56yo male therapeutic on heparin with initial dosing while awaiting CABG.  Will continue gtt at current rate and confirm stable with additional level.  Colleen Can PharmD BCPS 06/27/2012,2:03 AM

## 2012-06-27 NOTE — Progress Notes (Addendum)
Pre-op Cardiac Surgery  Carotid Findings:  No ICA stenosis.  Vertebral artery flow is antegrade.  Terry Hansen, RVS  Upper Extremity Right Left  Brachial Pressures 114 Triphasic  115  Triphasic   Radial Waveforms Triphasic  Triphasic   Ulnar Waveforms Triphasic  Triphasic   Palmar Arch (Allen's Test) Within normal limits  Within normal limits.    Terry Hansen, RVT 06/28/2012 11:37 AM

## 2012-06-27 NOTE — Progress Notes (Signed)
Patient ID: Terry Hansen, male   DOB: 11/19/1955, 56 y.o.   MRN: [id number]   SUBJECTIVE:  Catheterization was done yesterday. Plans are for CABG on Monday. Echo has been done. The EF is 55-60%. The study is technically difficult. There is trivial aortic insufficiency.   Filed Vitals:   06/26/12 1508 06/26/12 1515 06/26/12 2054 06/27/12 0518  BP: 130/76 110/77 119/73 103/65  Pulse: 85 82 81 69  Temp:   98 F (36.7 C) 97.9 F (36.6 C)  TempSrc:   Oral Oral  Resp:  18 18 18   Height:      Weight:      SpO2:   97% 94%    Intake/Output Summary (Last 24 hours) at 06/27/12 1020 Last data filed at 06/27/12 0810  Gross per 24 hour  Intake    720 ml  Output      0 ml  Net    720 ml    LABS: Basic Metabolic Panel:  Basename 06/27/12 0455 06/26/12 0450  NA 135 135  K 3.8 4.2  CL 100 102  CO2 25 23  GLUCOSE 94 95  BUN 12 10  CREATININE 0.81 0.84  CALCIUM 8.7 8.7  MG -- --  PHOS -- --   Liver Function Tests:  [facility] 06/25/12 2348  AST 12  ALT 15  ALKPHOS 92  BILITOT 0.3  PROT 6.2  ALBUMIN 3.3*   No results found for this basename: LIPASE:2,AMYLASE:2 in the last 72 hours CBC:  Basename 06/27/12 0455 06/26/12 0450 06/25/12 0620  WBC 11.0* 8.7 --  NEUTROABS -- -- 4.7  HGB 16.3 16.4 --  HCT 46.0 44.9 --  MCV 83.0 81.9 --  PLT 247 271 --   Cardiac Enzymes:  Basename 06/26/12 0450 06/25/12 2347 06/25/12 0620  CKTOTAL -- -- --  CKMB -- -- --  CKMBINDEX -- -- --  TROPONINI <0.30 <0.30 <0.30   BNP: No components found with this basename: POCBNP:3 D-Dimer: No results found for this basename: DDIMER:2 in the last 72 hours Hemoglobin A1C:  Basename 06/25/12 2348  HGBA1C 5.6   Fasting Lipid Panel:  Basename 06/26/12 0450  CHOL 145  HDL 40  LDLCALC 88  TRIG 83  CHOLHDL 3.6  LDLDIRECT --   Thyroid Function Tests: No results found for this basename: TSH,T4TOTAL,FREET3,T3FREE,THYROIDAB in the last 72 hours  RADIOLOGY: Dg Chest 2 View  06/25/2012   *RADIOLOGY REPORT*  Clinical Data: Chest pain and cough.  CHEST - 2 VIEW  Comparison: None.  Findings: The heart size and pulmonary vascularity are normal and the lungs are clear.  No osseous abnormality.  IMPRESSION: Normal chest.   Original Report Authenticated By: Francene Boyers, M.D.    Ct Heart Morp W/cta Cor W/score W/ca W/cm &/or Wo/cm  06/25/2012  *RADIOLOGY REPORT*  INDICATION: Upper chest pain.  Severe family history of coronary artery disease.  Hyperlipidemia and hypertension.  CT ANGIOGRAPHY OF THE HEART, CORONARY ARTERY, STRUCTURE, AND MORPHOLOGY  COMPARISON:  Plain film of earlier in the day.  No prior CT.  CONTRAST: 80mL OMNIPAQUE IOHEXOL 350 MG/ML SOLN  TECHNIQUE:  CT angiography of the coronary vessels was performed on a 256 channel system using prospective ECG gating.  A scout and ECG- gated noncontrast exam (for calcium scoring) were performed. Appropriate delay was determined by bolus tracking after injection of iodinated contrast, and an ECG-gated coronary CTA was performed with sub-mm slice collimation during late diastole.  Imaging post processing was performed on an independent workstation creating multiplanar and  3-D images, allowing for quantitative analysis of the heart and coronary arteries.  Note that this exam targets the heart and the chest was not imaged in its entirety.  PREMEDICATION: Lopressor  100 mg, P.O. Lopressor  2.5 mg, IV Nitroglycerin  0.4  mcg, sublingual.  FINDINGS: Technical quality: Good. Heart rate:  60.  CORONARY ARTERIES: Left Main:              Absent (separate origin of the LAD and left circumflex.) LAD:              Has calcified and noncalcified plaque just distal to its origin.  Positive remodeling.  No stenosis proximally.  Just distal to the origin of a first diagonal is a calcified and noncalcified plaque which causes greater than 75% stenosis. Opacified distally with areas of primarily calcified plaque identified. Diagonals:              Patent diminutive  first diagonal. Extensive calcified and noncalcified plaque within the second diagonal proximally.  This branches and has both calcified and noncalcified plaque throughout its distal course.  Patent. LCx:              Large, dominant vessel which arises immediately posterior to the LAD from the aorta.  Mild misregistration in its mid to distal course.  Without significant stenosis, continues distally to supply posterolateral branches and the PDA. OMs:              Branching large first marginal which has calcified plaque in its proximal and midcourse with positive remodeling.  No definite stenosis.  Mild misregistration. A second marginal is moderate sized patent. RCA:              Small to moderate sized, nondominant vessel. Calcified and noncalcified plaque with positive remodeling just distal to its origin.  Poorly evaluated in its mid to distal course secondary to small size and misregistration artifact.  No definite high-grade stenosis. PDA:                    Moderate sized and supplied by the left circumflex. Dominance:        Left-sided.  CORONARY CALCIUM: Total Agatston Score:         151 (68.7 in the LAD, 61 in the diagonals, 12.70 in the left circumflex, and 8.9 in the RCA. MESA database percentile:     79th, corresponding to an "arterial age" of 77 years.  AORTA AND PULMONARY MEASUREMENTS:  Aortic root (21 - 40 mm):     25 mm at the annulus                               36 mm at the sinuses of Valsalva                               22 mm at the sinotubular  junction                                Ascending aorta:  (< 40 mm):  25 mm Descending aorta:  (< 40 mm): 24 mm Main pulmonary artery: (< 30 mm): 25 mm  OTHER FINDINGS: Lungs/pleura: Volume loss and subsegmental atelectasis or scarring at the right greater than left lung bases.  No pleural fluid.  Heart/Mediastinum: No imaged  thoracic adenopathy.  No aortic dissection. No central pulmonary embolism, on this non-dedicated study.  No cardiac mass.  No  left atrial appendage thrombus. Calcifications involving the aortic valve leaflets, most apparent on coronal image 30/series 8088.  Upper abdomen: No significant findings.  Bones/Musculoskeletal:  No acute osseous abnormality.  IMPRESSION:  1.  Age advanced, obstructive coronary artery disease.  There is mixed plaque in the proximal LAD that results in greater than 75% luminal stenosis. 2.  Other areas of extensive mixed plaque with foci of positive remodeling. 3.  Separate origin of the LAD and left circumflex, left dominant system. 4.  Aortic valvular calcifications.  Consider non emergent echocardiography to exclude aortic valve disease. 5.  Suboptimal evaluation, especially of the right coronary artery secondary to mild misregistration artifact.  Report was called to CDU mid level, Sherry at 3:27 p.m. on 06/25/2012.   Original Report Authenticated By: Jeronimo Greaves, M.D.     PHYSICAL EXAM  Patient is oriented to person time and place. Affect is normal. There is no jugulovenous distention. Lungs are clear. Respiratory effort is nonlabored. Cardiac exam reveals S1 and S2. There no clicks or significant murmurs. The abdomen is soft. There is no peripheral edema   TELEMETRY: I have reviewed telemetry today June 27, 2012. There is normal sinus rhythm.   ASSESSMENT AND PLAN:  CAD    Plans have been made for the patient undergo CABG. There is good LV function by echo. No major valvular abnormalities.   Mixed hyperlipidemia   Willa Rough 06/27/2012 10:20 AM

## 2012-06-27 NOTE — Progress Notes (Signed)
Gave pt Incentive spirometer and instructed pt on how to use this for post-op. Pt returned demonstration.

## 2012-06-28 DIAGNOSIS — Z01810 Encounter for preprocedural cardiovascular examination: Secondary | ICD-10-CM

## 2012-06-28 LAB — BLOOD GAS, ARTERIAL
Acid-Base Excess: 1.2 mmol/L (ref 0.0–2.0)
Bicarbonate: 25.1 mEq/L — ABNORMAL HIGH (ref 20.0–24.0)
FIO2: 0.21 %
O2 Saturation: 96.1 %
Patient temperature: 98.6
pO2, Arterial: 77 mmHg — ABNORMAL LOW (ref 80.0–100.0)

## 2012-06-28 LAB — CBC
HCT: 44.9 % (ref 39.0–52.0)
Hemoglobin: 16.1 g/dL (ref 13.0–17.0)
MCH: 29.6 pg (ref 26.0–34.0)
MCHC: 35.9 g/dL (ref 30.0–36.0)
MCV: 82.5 fL (ref 78.0–100.0)
RDW: 12.5 % (ref 11.5–15.5)

## 2012-06-28 LAB — COMPREHENSIVE METABOLIC PANEL
Albumin: 3.2 g/dL — ABNORMAL LOW (ref 3.5–5.2)
Alkaline Phosphatase: 103 U/L (ref 39–117)
BUN: 11 mg/dL (ref 6–23)
CO2: 25 mEq/L (ref 19–32)
Chloride: 100 mEq/L (ref 96–112)
Creatinine, Ser: 0.84 mg/dL (ref 0.50–1.35)
GFR calc non Af Amer: >90 mL/min (ref >90)
Potassium: 3.7 mEq/L (ref 3.5–5.1)
Total Bilirubin: 0.5 mg/dL (ref 0.3–1.2)

## 2012-06-28 LAB — URINALYSIS, ROUTINE W REFLEX MICROSCOPIC
Glucose, UA: NEGATIVE mg/dL
Hgb urine dipstick: NEGATIVE
Ketones, ur: NEGATIVE mg/dL
Leukocytes, UA: NEGATIVE
Protein, ur: NEGATIVE mg/dL
Urobilinogen, UA: 1 mg/dL (ref 0.0–1.0)

## 2012-06-28 LAB — HEPARIN LEVEL (UNFRACTIONATED): Heparin Unfractionated: 0.28 IU/mL — ABNORMAL LOW (ref 0.30–0.70)

## 2012-06-28 LAB — APTT: aPTT: 87 seconds — ABNORMAL HIGH (ref 24–37)

## 2012-06-28 LAB — SURGICAL PCR SCREEN: Staphylococcus aureus: POSITIVE — AB

## 2012-06-28 MED ORDER — CEFUROXIME SODIUM 1.5 G IJ SOLR
1.5000 g | INTRAMUSCULAR | Status: AC
  Administered 2012-06-29: .75 g via INTRAVENOUS
  Administered 2012-06-29: 1.5 g via INTRAVENOUS
  Filled 2012-06-28: qty 1.5

## 2012-06-28 MED ORDER — SODIUM CHLORIDE 0.9 % IV SOLN
INTRAVENOUS | Status: DC
  Filled 2012-06-28: qty 40

## 2012-06-28 MED ORDER — PLASMA-LYTE 148 IV SOLN
INTRAVENOUS | Status: AC
  Administered 2012-06-29: 09:00:00
  Filled 2012-06-28: qty 2.5

## 2012-06-28 MED ORDER — CHLORHEXIDINE GLUCONATE 4 % EX LIQD
60.0000 mL | Freq: Once | CUTANEOUS | Status: AC
  Administered 2012-06-29: 4 via TOPICAL
  Filled 2012-06-28 (×2): qty 60

## 2012-06-28 MED ORDER — CHLORHEXIDINE GLUCONATE 4 % EX LIQD
60.0000 mL | Freq: Once | CUTANEOUS | Status: AC
  Administered 2012-06-28: 4 via TOPICAL
  Filled 2012-06-28: qty 60

## 2012-06-28 MED ORDER — DEXTROSE 5 % IV SOLN
750.0000 mg | INTRAVENOUS | Status: DC
  Filled 2012-06-28: qty 750

## 2012-06-28 MED ORDER — POTASSIUM CHLORIDE 2 MEQ/ML IV SOLN
80.0000 meq | INTRAVENOUS | Status: DC
  Filled 2012-06-28: qty 40

## 2012-06-28 MED ORDER — DEXMEDETOMIDINE HCL IN NACL 400 MCG/100ML IV SOLN
0.1000 ug/kg/h | INTRAVENOUS | Status: AC
  Administered 2012-06-29: 0.2 ug/kg/h via INTRAVENOUS
  Filled 2012-06-28: qty 100

## 2012-06-28 MED ORDER — MAGNESIUM SULFATE 50 % IJ SOLN
40.0000 meq | INTRAMUSCULAR | Status: DC
  Filled 2012-06-28: qty 10

## 2012-06-28 MED ORDER — EPINEPHRINE HCL 1 MG/ML IJ SOLN
0.5000 ug/min | INTRAVENOUS | Status: DC
  Filled 2012-06-28: qty 4

## 2012-06-28 MED ORDER — NITROGLYCERIN IN D5W 200-5 MCG/ML-% IV SOLN
2.0000 ug/min | INTRAVENOUS | Status: DC
  Filled 2012-06-28: qty 250

## 2012-06-28 MED ORDER — PHENYLEPHRINE HCL 10 MG/ML IJ SOLN
30.0000 ug/min | INTRAVENOUS | Status: DC
  Administered 2012-06-29: 2 ug/min via INTRAVENOUS
  Filled 2012-06-28: qty 2

## 2012-06-28 MED ORDER — VANCOMYCIN HCL 10 G IV SOLR
1250.0000 mg | INTRAVENOUS | Status: AC
  Administered 2012-06-29: 1250 mg via INTRAVENOUS
  Filled 2012-06-28: qty 1250

## 2012-06-28 MED ORDER — DOPAMINE-DEXTROSE 3.2-5 MG/ML-% IV SOLN
2.0000 ug/kg/min | INTRAVENOUS | Status: DC
  Filled 2012-06-28: qty 250

## 2012-06-28 MED ORDER — SODIUM CHLORIDE 0.9 % IV SOLN
INTRAVENOUS | Status: AC
  Administered 2012-06-29: 1.2 [IU]/h via INTRAVENOUS
  Filled 2012-06-28: qty 1

## 2012-06-28 NOTE — Progress Notes (Signed)
ANTICOAGULATION CONSULT NOTE - Follow Up Consult  Pharmacy Consult for Heparin Indication: CAD awaiting CABG 12/9  No Known Allergies  Patient Measurements: Height: 5\' 11"  (180.3 cm) Weight: 189 lb 9.5 oz (86 kg) IBW/kg (Calculated) : 75.3  Heparin Dosing Weight: 86 kg  Vital Signs: Temp: 98.5 F (36.9 C) (12/08 0438) Temp src: Oral (12/08 0438) BP: 107/70 mmHg (12/08 0438) Pulse Rate: 59  (12/08 0438)  Labs:  Alvira Philips 06/28/12 1610 06/27/12 0828 06/27/12 0455 06/27/12 0102 06/26/12 0450 06/25/12 2348 06/25/12 2347  HGB 16.1 -- 16.3 -- -- -- --  HCT 44.9 -- 46.0 -- 44.9 -- --  PLT 249 -- 247 -- 271 -- --  APTT 87* -- -- -- -- -- --  LABPROT -- -- -- -- -- 13.3 --  INR -- -- -- -- -- 1.02 --  HEPARINUNFRC 0.28* 0.39 -- 0.53 -- -- --  CREATININE 0.84 -- 0.81 -- 0.84 -- --  CKTOTAL -- -- -- -- -- -- --  CKMB -- -- -- -- -- -- --  TROPONINI -- -- -- -- <0.30 -- <0.30    Estimated Creatinine Clearance: 102.1 ml/min (by C-G formula based on Cr of 0.84).  Assessment:   Heparin level is just below target range on 1200 units/hr. CBC stable.  For OHS 12/9.  Goal of Therapy:  Heparin level 0.3-0.7 units/ml Monitor platelets by anticoagulation protocol: Yes   Plan:   Increase heparin drip to 1350 units/hr.  Heparin level and CBC in am.  Dennie Fetters, [place] Pager: [phone] 06/28/2012,11:04 AM

## 2012-06-28 NOTE — Plan of Care (Signed)
Problem: Phase I Progression Outcomes Goal: Initial discharge plan identified Outcome: Not Met (add Reason) Family out of town and pt and family are trying to coordinate plans.

## 2012-06-28 NOTE — Plan of Care (Signed)
Problem: Phase I Progression Outcomes Goal: Initial discharge plan identified Outcome: Completed/Met Date Met:  06/28/12 Family coordinating who will be with patient at discharge

## 2012-06-28 NOTE — Progress Notes (Signed)
Patient ID: Terry Hansen, male   DOB: 1955-07-30, 56 y.o.   MRN: [id number]   SUBJECTIVE  Patient is feeling well. He is ready to have his bypass surgery tomorrow. He's not had any recurring chest pain.   Filed Vitals:   06/27/12 0518 06/27/12 1414 06/27/12 2136 06/28/12 0438  BP: 103/65 118/64 113/73 107/70  Pulse: 69 58 58 59  Temp: 97.9 F (36.6 C) 97.7 F (36.5 C) 98.7 F (37.1 C) 98.5 F (36.9 C)  TempSrc: Oral Oral Oral Oral  Resp: 18 16 17 18   Height:      Weight:      SpO2: 94% 98% 95% 94%    Intake/Output Summary (Last 24 hours) at 06/28/12 1002 Last data filed at 06/28/12 0700  Gross per 24 hour  Intake    480 ml  Output      0 ml  Net    480 ml    LABS: Basic Metabolic Panel:  Basename 06/28/12 0635 06/27/12 0455  NA 134* 135  K 3.7 3.8  CL 100 100  CO2 25 25  GLUCOSE 98 94  BUN 11 12  CREATININE 0.84 0.81  CALCIUM 8.8 8.7  MG -- --  PHOS -- --   Liver Function Tests:  [facility] 06/28/12 0635 06/25/12 2348  AST 12 12  ALT 14 15  ALKPHOS 103 92  BILITOT 0.5 0.3  PROT 6.1 6.2  ALBUMIN 3.2* 3.3*   No results found for this basename: LIPASE:2,AMYLASE:2 in the last 72 hours CBC:  Basename 06/28/12 0635 06/27/12 0455  WBC 9.1 11.0*  NEUTROABS -- --  HGB 16.1 16.3  HCT 44.9 46.0  MCV 82.5 83.0  PLT 249 247   Cardiac Enzymes:  Basename 06/26/12 0450 06/25/12 2347  CKTOTAL -- --  CKMB -- --  CKMBINDEX -- --  TROPONINI <0.30 <0.30   BNP: No components found with this basename: POCBNP:3 D-Dimer: No results found for this basename: DDIMER:2 in the last 72 hours Hemoglobin A1C:  Basename 06/25/12 2348  HGBA1C 5.6   Fasting Lipid Panel:  Basename 06/26/12 0450  CHOL 145  HDL 40  LDLCALC 88  TRIG 83  CHOLHDL 3.6  LDLDIRECT --   Thyroid Function Tests: No results found for this basename: TSH,T4TOTAL,FREET3,T3FREE,THYROIDAB in the last 72 hours  RADIOLOGY: Dg Chest 2 View  06/25/2012  *RADIOLOGY REPORT*  Clinical Data: Chest  pain and cough.  CHEST - 2 VIEW  Comparison: None.  Findings: The heart size and pulmonary vascularity are normal and the lungs are clear.  No osseous abnormality.  IMPRESSION: Normal chest.   Original Report Authenticated By: Francene Boyers, M.D.    Ct Heart Morp W/cta Cor W/score W/ca W/cm &/or Wo/cm  06/25/2012  *RADIOLOGY REPORT*  INDICATION: Upper chest pain.  Severe family history of coronary artery disease.  Hyperlipidemia and hypertension.  CT ANGIOGRAPHY OF THE HEART, CORONARY ARTERY, STRUCTURE, AND MORPHOLOGY  COMPARISON:  Plain film of earlier in the day.  No prior CT.  CONTRAST: 80mL OMNIPAQUE IOHEXOL 350 MG/ML SOLN  TECHNIQUE:  CT angiography of the coronary vessels was performed on a 256 channel system using prospective ECG gating.  A scout and ECG- gated noncontrast exam (for calcium scoring) were performed. Appropriate delay was determined by bolus tracking after injection of iodinated contrast, and an ECG-gated coronary CTA was performed with sub-mm slice collimation during late diastole.  Imaging post processing was performed on an independent workstation creating multiplanar and 3-D images, allowing for quantitative analysis of the  heart and coronary arteries.  Note that this exam targets the heart and the chest was not imaged in its entirety.  PREMEDICATION: Lopressor  100 mg, P.O. Lopressor  2.5 mg, IV Nitroglycerin  0.4  mcg, sublingual.  FINDINGS: Technical quality: Good. Heart rate:  60.  CORONARY ARTERIES: Left Main:              Absent (separate origin of the LAD and left circumflex.) LAD:              Has calcified and noncalcified plaque just distal to its origin.  Positive remodeling.  No stenosis proximally.  Just distal to the origin of a first diagonal is a calcified and noncalcified plaque which causes greater than 75% stenosis. Opacified distally with areas of primarily calcified plaque identified. Diagonals:              Patent diminutive first diagonal. Extensive calcified and  noncalcified plaque within the second diagonal proximally.  This branches and has both calcified and noncalcified plaque throughout its distal course.  Patent. LCx:              Large, dominant vessel which arises immediately posterior to the LAD from the aorta.  Mild misregistration in its mid to distal course.  Without significant stenosis, continues distally to supply posterolateral branches and the PDA. OMs:              Branching large first marginal which has calcified plaque in its proximal and midcourse with positive remodeling.  No definite stenosis.  Mild misregistration. A second marginal is moderate sized patent. RCA:              Small to moderate sized, nondominant vessel. Calcified and noncalcified plaque with positive remodeling just distal to its origin.  Poorly evaluated in its mid to distal course secondary to small size and misregistration artifact.  No definite high-grade stenosis. PDA:                    Moderate sized and supplied by the left circumflex. Dominance:        Left-sided.  CORONARY CALCIUM: Total Agatston Score:         151 (68.7 in the LAD, 61 in the diagonals, 12.70 in the left circumflex, and 8.9 in the RCA. MESA database percentile:     79th, corresponding to an "arterial age" of 6 years.  AORTA AND PULMONARY MEASUREMENTS:  Aortic root (21 - 40 mm):     25 mm at the annulus                               36 mm at the sinuses of Valsalva                               22 mm at the sinotubular  junction                                Ascending aorta:  (< 40 mm):  25 mm Descending aorta:  (< 40 mm): 24 mm Main pulmonary artery: (< 30 mm): 25 mm  OTHER FINDINGS: Lungs/pleura: Volume loss and subsegmental atelectasis or scarring at the right greater than left lung bases.  No pleural fluid.  Heart/Mediastinum: No imaged thoracic adenopathy.  No aortic dissection. No central  pulmonary embolism, on this non-dedicated study.  No cardiac mass.  No left atrial appendage thrombus.  Calcifications involving the aortic valve leaflets, most apparent on coronal image 30/series 8088.  Upper abdomen: No significant findings.  Bones/Musculoskeletal:  No acute osseous abnormality.  IMPRESSION:  1.  Age advanced, obstructive coronary artery disease.  There is mixed plaque in the proximal LAD that results in greater than 75% luminal stenosis. 2.  Other areas of extensive mixed plaque with foci of positive remodeling. 3.  Separate origin of the LAD and left circumflex, left dominant system. 4.  Aortic valvular calcifications.  Consider non emergent echocardiography to exclude aortic valve disease. 5.  Suboptimal evaluation, especially of the right coronary artery secondary to mild misregistration artifact.  Report was called to CDU mid level, Sherry at 3:27 p.m. on 06/25/2012.   Original Report Authenticated By: Jeronimo Greaves, M.D.     PHYSICAL EXAM   Patient is oriented to person time and place. Affect is normal. He is quite stable. Cardiac exam reveals S1 and S2.   TELEMETRY: I have reviewed telemetry today June 28, 2012. There is normal sinus rhythm.   ASSESSMENT AND PLAN:   CAD (coronary artery disease)    Patient is scheduled for bypass surgery tomorrow. He is looking forward to speaking further with the surgical team today.   Willa Rough 06/28/2012 10:02 AM

## 2012-06-28 NOTE — Progress Notes (Signed)
Patient ID: Terry Hansen, male   DOB: 10/28/1955, 56 y.o.   MRN: [id number]                   [address]            [place] [address]          [phone]     2 Days Post-Op Procedure(s) (LRB): LEFT HEART CATHETERIZATION WITH CORONARY ANGIOGRAM (N/A)  LOS: 3 days   Subjective: No chest pain for surgery in am  Objective: Vital signs in last 24 hours: Patient Vitals for the past 24 hrs:  BP Temp Temp src Pulse Resp SpO2  06/28/12 0438 107/70 mmHg 98.5 F (36.9 C) Oral 59  18  94 %  06/27/12 2136 113/73 mmHg 98.7 F (37.1 C) Oral 58  17  95 %  06/27/12 1414 118/64 mmHg 97.7 F (36.5 C) Oral 58  16  98 %    Filed Weights   06/25/12 0601 06/25/12 2250  Weight: 192 lb (87.091 kg) 189 lb 9.5 oz (86 kg)    Hemodynamic parameters for last 24 hours:    Intake/Output from previous day: 12/07 0701 - 12/08 0700 In: 720 [P.O.:720] Out: -  Intake/Output this shift:    Scheduled Meds:   . aspirin EC  325 mg Oral Daily  . atorvastatin  40 mg Oral q1800  . bisacodyl  5 mg Oral Once  . chlorhexidine  60 mL Topical Once  . diazepam  5 mg Oral Once  . loratadine  10 mg Oral Daily  . metoprolol tartrate  12.5 mg Oral Once  . propranolol ER  60 mg Oral Daily   Continuous Infusions:   . sodium chloride 1,000 mL (06/28/12 0807)  . heparin 1,350 Units/hr (06/28/12 1115)   PRN Meds:.acetaminophen, diazepam, morphine injection, nitroGLYCERIN, ondansetron (ZOFRAN) IV, oxyCODONE-acetaminophen, temazepam, traMADol  General appearance: alert and cooperative Neurologic: intact Heart: regular rate and rhythm, S1, S2 normal, no murmur, click, rub or gallop and normal apical impulse Lungs: clear to auscultation bilaterally and normal percussion bilaterally Abdomen: soft, non-tender; bowel sounds normal; no masses,  no organomegaly Extremities: extremities normal, atraumatic, no cyanosis or edema and Homans sign is negative, no sign of DVT Rt radial cath site  ok   Lab Results: CBC: Basename 06/28/12 0635 06/27/12 0455  WBC 9.1 11.0*  HGB 16.1 16.3  HCT 44.9 46.0  PLT 249 247   BMET:  Basename 06/28/12 0635 06/27/12 0455  NA 134* 135  K 3.7 3.8  CL 100 100  CO2 25 25  GLUCOSE 98 94  BUN 11 12  CREATININE 0.84 0.81  CALCIUM 8.8 8.7    PT/INR:  Basename 06/25/12 2348  LABPROT 13.3  INR 1.02     Radiology No results found.   Assessment/Plan: S/P Procedure(s) (LRB): LEFT HEART CATHETERIZATION WITH CORONARY ANGIOGRAM (N/A) For cabg in am by Dr Dorris Fetch, he has explained procedure. Patient has no further questions Some concern about home care, lives in [place] by himself, may go to [place] postop  Delight Ovens MD 06/28/2012 12:12 PM

## 2012-06-29 ENCOUNTER — Inpatient Hospital Stay (HOSPITAL_COMMUNITY): Payer: No Typology Code available for payment source

## 2012-06-29 ENCOUNTER — Inpatient Hospital Stay (HOSPITAL_COMMUNITY): Payer: No Typology Code available for payment source | Admitting: Anesthesiology

## 2012-06-29 ENCOUNTER — Encounter (HOSPITAL_COMMUNITY): Payer: Self-pay | Admitting: Anesthesiology

## 2012-06-29 ENCOUNTER — Encounter (HOSPITAL_COMMUNITY)
Admission: EM | Disposition: A | Discharge status: Home / Self Care | Payer: Self-pay | Source: Home / Self Care | Attending: Thoracic Surgery (Cardiothoracic Vascular Surgery)

## 2012-06-29 DIAGNOSIS — R079 Chest pain, unspecified: Secondary | ICD-10-CM

## 2012-06-29 DIAGNOSIS — I2510 Atherosclerotic heart disease of native coronary artery without angina pectoris: Secondary | ICD-10-CM

## 2012-06-29 HISTORY — PX: CORONARY ARTERY BYPASS GRAFT: SHX141

## 2012-06-29 LAB — POCT I-STAT 4, (NA,K, GLUC, HGB,HCT)
Glucose, Bld: 100 mg/dL — ABNORMAL HIGH (ref 70–99)
Glucose, Bld: 89 mg/dL (ref 70–99)
Glucose, Bld: 109 mg/dL — ABNORMAL HIGH (ref 70–99)
HCT: 31 % — ABNORMAL LOW (ref 39.0–52.0)
HCT: 33 % — ABNORMAL LOW (ref 39.0–52.0)
HCT: 40 % (ref 39.0–52.0)
Hemoglobin: 13.3 g/dL (ref 13.0–17.0)
Hemoglobin: 10.2 g/dL — ABNORMAL LOW (ref 13.0–17.0)
Hemoglobin: 11.2 g/dL — ABNORMAL LOW (ref 13.0–17.0)
Hemoglobin: 13.6 g/dL (ref 13.0–17.0)
Potassium: 3.6 mEq/L (ref 3.5–5.1)
Potassium: 3.6 mEq/L (ref 3.5–5.1)
Potassium: 4.5 mEq/L (ref 3.5–5.1)
Potassium: 4.1 mEq/L (ref 3.5–5.1)
Sodium: 138 mEq/L (ref 135–145)
Sodium: 134 mEq/L — ABNORMAL LOW (ref 135–145)
Sodium: 138 mEq/L (ref 135–145)
Sodium: 137 mEq/L (ref 135–145)

## 2012-06-29 LAB — POCT I-STAT 3, ART BLOOD GAS (G3+)
Acid-Base Excess: 3 mmol/L — ABNORMAL HIGH (ref 0.0–2.0)
Acid-base deficit: 1 mmol/L (ref 0.0–2.0)
Acid-base deficit: 2 mmol/L (ref 0.0–2.0)
Acid-base deficit: 5 mmol/L — ABNORMAL HIGH (ref 0.0–2.0)
Acid-base deficit: 4 mmol/L — ABNORMAL HIGH (ref 0.0–2.0)
Bicarbonate: 25.6 mEq/L — ABNORMAL HIGH (ref 20.0–24.0)
Bicarbonate: 23.9 mEq/L (ref 20.0–24.0)
Bicarbonate: 21.5 mEq/L (ref 20.0–24.0)
O2 Saturation: 100 %
O2 Saturation: 100 %
O2 Saturation: 97 %
O2 Saturation: 97 %
O2 Saturation: 98 %
Patient temperature: 35.9
TCO2: 25 mmol/L (ref 0–100)
TCO2: 27 mmol/L (ref 0–100)
TCO2: 25 mmol/L (ref 0–100)
TCO2: 22 mmol/L (ref 0–100)
TCO2: 26 mmol/L (ref 0–100)
TCO2: 23 mmol/L (ref 0–100)
pCO2 arterial: 35.4 mmHg (ref 35.0–45.0)
pCO2 arterial: 39.3 mmHg (ref 35.0–45.0)
pCO2 arterial: 39.9 mmHg (ref 35.0–45.0)
pCO2 arterial: 42.7 mmHg (ref 35.0–45.0)
pH, Arterial: 7.468 — ABNORMAL HIGH (ref 7.350–7.450)
pH, Arterial: 7.455 — ABNORMAL HIGH (ref 7.350–7.450)
pH, Arterial: 7.348 — ABNORMAL LOW (ref 7.350–7.450)
pO2, Arterial: 324 mmHg — ABNORMAL HIGH (ref 80.0–100.0)
pO2, Arterial: 222 mmHg — ABNORMAL HIGH (ref 80.0–100.0)
pO2, Arterial: 303 mmHg — ABNORMAL HIGH (ref 80.0–100.0)
pO2, Arterial: 107 mmHg — ABNORMAL HIGH (ref 80.0–100.0)
pO2, Arterial: 93 mmHg (ref 80.0–100.0)
pO2, Arterial: 109 mmHg — ABNORMAL HIGH (ref 80.0–100.0)

## 2012-06-29 LAB — CBC
HCT: 38.3 % — ABNORMAL LOW (ref 39.0–52.0)
Hemoglobin: 13.7 g/dL (ref 13.0–17.0)
MCH: 29.7 pg (ref 26.0–34.0)
MCH: 29.7 pg (ref 26.0–34.0)
MCHC: 35.4 g/dL (ref 30.0–36.0)
Platelets: 141 10*3/uL — ABNORMAL LOW (ref 150–400)
Platelets: 180 10*3/uL (ref 150–400)
RBC: 4.62 MIL/uL (ref 4.22–5.81)
RDW: 12.6 % (ref 11.5–15.5)
RDW: 12.7 % (ref 11.5–15.5)
WBC: 10.7 10*3/uL — ABNORMAL HIGH (ref 4.0–10.5)

## 2012-06-29 LAB — PROTIME-INR: INR: 1.25 (ref 0.00–1.49)

## 2012-06-29 LAB — MAGNESIUM: Magnesium: 2.9 mg/dL — ABNORMAL HIGH (ref 1.5–2.5)

## 2012-06-29 LAB — POCT I-STAT, CHEM 8
BUN: 7 mg/dL (ref 6–23)
Calcium, Ion: 1.18 mmol/L (ref 1.12–1.23)
Creatinine, Ser: 0.8 mg/dL (ref 0.50–1.35)
Glucose, Bld: 131 mg/dL — ABNORMAL HIGH (ref 70–99)
Hemoglobin: 12.2 g/dL — ABNORMAL LOW (ref 13.0–17.0)
Sodium: 139 mEq/L (ref 135–145)
TCO2: 22 mmol/L (ref 0–100)

## 2012-06-29 LAB — PLATELET COUNT: Platelets: 164 10*3/uL (ref 150–400)

## 2012-06-29 LAB — APTT: aPTT: 38 seconds — ABNORMAL HIGH (ref 24–37)

## 2012-06-29 LAB — HEMOGLOBIN AND HEMATOCRIT, BLOOD
HCT: 33.5 % — ABNORMAL LOW (ref 39.0–52.0)
Hemoglobin: 12 g/dL — ABNORMAL LOW (ref 13.0–17.0)

## 2012-06-29 LAB — GLUCOSE, CAPILLARY: Glucose-Capillary: 103 mg/dL — ABNORMAL HIGH (ref 70–99)

## 2012-06-29 SURGERY — CORONARY ARTERY BYPASS GRAFTING (CABG)
Anesthesia: General | Site: Chest | Wound class: Clean

## 2012-06-29 MED ORDER — SODIUM CHLORIDE 0.9 % IV SOLN
10.0000 g | INTRAVENOUS | Status: DC | PRN
  Administered 2012-06-29: 5 g/h via INTRAVENOUS

## 2012-06-29 MED ORDER — BISACODYL 10 MG RE SUPP: 10.0000 mg | Freq: Every day | RECTAL | Status: DC

## 2012-06-29 MED ORDER — ACETAMINOPHEN 10 MG/ML IV SOLN
1000.0000 mg | Freq: Once | INTRAVENOUS | Status: AC
  Administered 2012-06-29: 1000 mg via INTRAVENOUS
  Filled 2012-06-29: qty 100

## 2012-06-29 MED ORDER — POTASSIUM CHLORIDE 10 MEQ/50ML IV SOLN: 10.0000 meq | INTRAVENOUS | Status: AC

## 2012-06-29 MED ORDER — PHENYLEPHRINE HCL 10 MG/ML IJ SOLN
INTRAMUSCULAR | Status: DC | PRN
  Administered 2012-06-29 (×3): 40 ug via INTRAVENOUS

## 2012-06-29 MED ORDER — ACETAMINOPHEN 160 MG/5ML PO SOLN
975.0000 mg | Freq: Four times a day (QID) | ORAL | Status: DC
  Filled 2012-06-29: qty 40.6

## 2012-06-29 MED ORDER — DEXMEDETOMIDINE HCL IN NACL 200 MCG/50ML IV SOLN
0.1000 ug/kg/h | INTRAVENOUS | Status: DC
  Administered 2012-06-29: 0.7 ug/kg/h via INTRAVENOUS
  Filled 2012-06-29: qty 50

## 2012-06-29 MED ORDER — GLYCOPYRROLATE 0.2 MG/ML IJ SOLN
INTRAMUSCULAR | Status: DC | PRN
  Administered 2012-06-29: 0.2 mg via INTRAVENOUS

## 2012-06-29 MED ORDER — BISACODYL 5 MG PO TBEC
10.0000 mg | DELAYED_RELEASE_TABLET | Freq: Every day | ORAL | Status: DC
  Administered 2012-06-30 – 2012-07-03 (×4): 10 mg via ORAL
  Filled 2012-06-29 (×4): qty 2

## 2012-06-29 MED ORDER — ASPIRIN EC 325 MG PO TBEC
325.0000 mg | DELAYED_RELEASE_TABLET | Freq: Every day | ORAL | Status: DC
  Administered 2012-06-30 – 2012-07-03 (×4): 325 mg via ORAL
  Filled 2012-06-29 (×4): qty 1

## 2012-06-29 MED ORDER — PHENYLEPHRINE HCL 10 MG/ML IJ SOLN
0.0000 ug/min | INTRAVENOUS | Status: DC
  Administered 2012-06-29: 2 ug/min via INTRAVENOUS
  Filled 2012-06-29: qty 2

## 2012-06-29 MED ORDER — LIDOCAINE HCL (CARDIAC) 20 MG/ML IV SOLN
INTRAVENOUS | Status: DC | PRN
  Administered 2012-06-29: 100 mg via INTRAVENOUS

## 2012-06-29 MED ORDER — HEMOSTATIC AGENTS (NO CHARGE) OPTIME
TOPICAL | Status: DC | PRN
  Administered 2012-06-29: 1 via TOPICAL

## 2012-06-29 MED ORDER — METOPROLOL TARTRATE 12.5 MG HALF TABLET
12.5000 mg | ORAL_TABLET | Freq: Two times a day (BID) | ORAL | Status: DC
  Filled 2012-06-29 (×3): qty 1

## 2012-06-29 MED ORDER — PANTOPRAZOLE SODIUM 40 MG PO TBEC
40.0000 mg | DELAYED_RELEASE_TABLET | Freq: Every day | ORAL | Status: DC
  Administered 2012-07-01 – 2012-07-03 (×3): 40 mg via ORAL
  Filled 2012-06-29 (×3): qty 1

## 2012-06-29 MED ORDER — ALBUMIN HUMAN 5 % IV SOLN
250.0000 mL | INTRAVENOUS | Status: DC | PRN
  Administered 2012-06-29 (×3): 250 mL via INTRAVENOUS
  Filled 2012-06-29: qty 250

## 2012-06-29 MED ORDER — SODIUM CHLORIDE 0.9 % IJ SOLN: 3.0000 mL | INTRAMUSCULAR | Status: DC | PRN

## 2012-06-29 MED ORDER — ROCURONIUM BROMIDE 100 MG/10ML IV SOLN
INTRAVENOUS | Status: DC | PRN
  Administered 2012-06-29 (×2): 50 mg via INTRAVENOUS

## 2012-06-29 MED ORDER — KETOROLAC TROMETHAMINE 30 MG/ML IJ SOLN
30.0000 mg | Freq: Four times a day (QID) | INTRAMUSCULAR | Status: DC | PRN
  Administered 2012-06-29: 30 mg via INTRAVENOUS
  Filled 2012-06-29: qty 1

## 2012-06-29 MED ORDER — SODIUM CHLORIDE 0.9 % IR SOLN
Status: DC | PRN
  Administered 2012-06-29: 6000 mL

## 2012-06-29 MED ORDER — INSULIN ASPART 100 UNIT/ML ~~LOC~~ SOLN
0.0000 [IU] | SUBCUTANEOUS | Status: AC
  Administered 2012-06-29: 2 [IU] via SUBCUTANEOUS

## 2012-06-29 MED ORDER — KETOROLAC TROMETHAMINE 30 MG/ML IJ SOLN
INTRAMUSCULAR | Status: AC
  Filled 2012-06-29: qty 1

## 2012-06-29 MED ORDER — LACTATED RINGERS IV SOLN
INTRAVENOUS | Status: DC | PRN
  Administered 2012-06-29: 06:00:00 via INTRAVENOUS

## 2012-06-29 MED ORDER — ASPIRIN 81 MG PO CHEW: 324.0000 mg | CHEWABLE_TABLET | Freq: Every day | ORAL | Status: DC

## 2012-06-29 MED ORDER — METOPROLOL TARTRATE 1 MG/ML IV SOLN: 2.5000 mg | INTRAVENOUS | Status: DC | PRN

## 2012-06-29 MED ORDER — LACTATED RINGERS IV SOLN: 500.0000 mL | Freq: Once | INTRAVENOUS | Status: AC | PRN

## 2012-06-29 MED ORDER — SODIUM CHLORIDE 0.45 % IV SOLN
INTRAVENOUS | Status: DC
  Administered 2012-06-29: 13:00:00 via INTRAVENOUS

## 2012-06-29 MED ORDER — FAMOTIDINE IN NACL 20-0.9 MG/50ML-% IV SOLN
20.0000 mg | Freq: Two times a day (BID) | INTRAVENOUS | Status: DC
  Administered 2012-06-29: 20 mg via INTRAVENOUS

## 2012-06-29 MED ORDER — SODIUM CHLORIDE 0.9 % IJ SOLN
OROMUCOSAL | Status: DC | PRN
  Administered 2012-06-29 (×3): via TOPICAL

## 2012-06-29 MED ORDER — NITROGLYCERIN IN D5W 200-5 MCG/ML-% IV SOLN: 0.0000 ug/min | INTRAVENOUS | Status: DC

## 2012-06-29 MED ORDER — OXYCODONE HCL 5 MG PO TABS
5.0000 mg | ORAL_TABLET | ORAL | Status: DC | PRN
  Administered 2012-06-29 – 2012-06-30 (×4): 5 mg via ORAL
  Administered 2012-06-30 (×2): 10 mg via ORAL
  Administered 2012-06-30: 5 mg via ORAL
  Administered 2012-06-30 – 2012-07-03 (×15): 10 mg via ORAL
  Filled 2012-06-29 (×6): qty 2
  Filled 2012-06-29: qty 1
  Filled 2012-06-29 (×4): qty 2
  Filled 2012-06-29 (×3): qty 1
  Filled 2012-06-29: qty 2
  Filled 2012-06-29: qty 1
  Filled 2012-06-29 (×6): qty 2

## 2012-06-29 MED ORDER — LACTATED RINGERS IV SOLN
INTRAVENOUS | Status: DC
  Administered 2012-06-29: 13:00:00 via INTRAVENOUS

## 2012-06-29 MED ORDER — HEPARIN SODIUM (PORCINE) 1000 UNIT/ML IJ SOLN
INTRAMUSCULAR | Status: DC | PRN
  Administered 2012-06-29: 23000 [IU] via INTRAVENOUS
  Administered 2012-06-29: 2000 [IU] via INTRAVENOUS

## 2012-06-29 MED ORDER — INSULIN REGULAR BOLUS VIA INFUSION
0.0000 [IU] | Freq: Three times a day (TID) | INTRAVENOUS | Status: DC
  Filled 2012-06-29: qty 10

## 2012-06-29 MED ORDER — MORPHINE SULFATE 2 MG/ML IJ SOLN
1.0000 mg | INTRAMUSCULAR | Status: DC | PRN
  Filled 2012-06-29: qty 1

## 2012-06-29 MED ORDER — DOCUSATE SODIUM 100 MG PO CAPS
200.0000 mg | ORAL_CAPSULE | Freq: Every day | ORAL | Status: DC
  Administered 2012-06-30 – 2012-07-03 (×4): 200 mg via ORAL
  Filled 2012-06-29 (×3): qty 2

## 2012-06-29 MED ORDER — MIDAZOLAM HCL 2 MG/2ML IJ SOLN
2.0000 mg | INTRAMUSCULAR | Status: DC | PRN
  Administered 2012-06-29 (×2): 0.5 mg via INTRAVENOUS
  Filled 2012-06-29: qty 2

## 2012-06-29 MED ORDER — MORPHINE SULFATE 2 MG/ML IJ SOLN
2.0000 mg | INTRAMUSCULAR | Status: DC | PRN
  Administered 2012-06-29 – 2012-06-30 (×6): 2 mg via INTRAVENOUS
  Administered 2012-06-30: 4 mg via INTRAVENOUS
  Filled 2012-06-29 (×7): qty 1

## 2012-06-29 MED ORDER — INSULIN ASPART 100 UNIT/ML ~~LOC~~ SOLN: 0.0000 [IU] | SUBCUTANEOUS | Status: DC

## 2012-06-29 MED ORDER — SODIUM CHLORIDE 0.9 % IV SOLN: 250.0000 mL | INTRAVENOUS | Status: DC

## 2012-06-29 MED ORDER — ATORVASTATIN CALCIUM 40 MG PO TABS
40.0000 mg | ORAL_TABLET | Freq: Every day | ORAL | Status: DC
  Administered 2012-06-30 – 2012-07-02 (×3): 40 mg via ORAL
  Filled 2012-06-29 (×4): qty 1

## 2012-06-29 MED ORDER — PROTAMINE SULFATE 10 MG/ML IV SOLN
INTRAVENOUS | Status: DC | PRN
  Administered 2012-06-29 (×4): 50 mg via INTRAVENOUS
  Administered 2012-06-29: 20 mg via INTRAVENOUS

## 2012-06-29 MED ORDER — EPHEDRINE SULFATE 50 MG/ML IJ SOLN
INTRAMUSCULAR | Status: DC | PRN
  Administered 2012-06-29 (×2): 10 mg via INTRAVENOUS

## 2012-06-29 MED ORDER — ARTIFICIAL TEARS OP OINT
TOPICAL_OINTMENT | OPHTHALMIC | Status: DC | PRN
  Administered 2012-06-29: 1 via OPHTHALMIC

## 2012-06-29 MED ORDER — PROPOFOL 10 MG/ML IV BOLUS
INTRAVENOUS | Status: DC | PRN
  Administered 2012-06-29: 50 mg via INTRAVENOUS
  Administered 2012-06-29: 100 mg via INTRAVENOUS

## 2012-06-29 MED ORDER — MAGNESIUM SULFATE 40 MG/ML IJ SOLN
4.0000 g | Freq: Once | INTRAMUSCULAR | Status: AC
  Administered 2012-06-29: 4 g via INTRAVENOUS
  Filled 2012-06-29: qty 100

## 2012-06-29 MED ORDER — FENTANYL CITRATE 0.05 MG/ML IJ SOLN
INTRAMUSCULAR | Status: DC | PRN
  Administered 2012-06-29: 100 ug via INTRAVENOUS
  Administered 2012-06-29: 250 ug via INTRAVENOUS
  Administered 2012-06-29: 100 ug via INTRAVENOUS
  Administered 2012-06-29: 250 ug via INTRAVENOUS
  Administered 2012-06-29: 100 ug via INTRAVENOUS
  Administered 2012-06-29 (×2): 250 ug via INTRAVENOUS
  Administered 2012-06-29: 150 ug via INTRAVENOUS

## 2012-06-29 MED ORDER — SODIUM CHLORIDE 0.9 % IJ SOLN
3.0000 mL | Freq: Two times a day (BID) | INTRAMUSCULAR | Status: DC
  Administered 2012-06-30: 3 mL via INTRAVENOUS

## 2012-06-29 MED ORDER — METOPROLOL TARTRATE 25 MG/10 ML ORAL SUSPENSION
12.5000 mg | Freq: Two times a day (BID) | ORAL | Status: DC
  Filled 2012-06-29 (×3): qty 5

## 2012-06-29 MED ORDER — ACETAMINOPHEN 500 MG PO TABS
1000.0000 mg | ORAL_TABLET | Freq: Four times a day (QID) | ORAL | Status: DC
  Administered 2012-06-29 – 2012-07-03 (×15): 1000 mg via ORAL
  Filled 2012-06-29 (×20): qty 2

## 2012-06-29 MED ORDER — VECURONIUM BROMIDE 10 MG IV SOLR
INTRAVENOUS | Status: DC | PRN
  Administered 2012-06-29: 10 mg via INTRAVENOUS

## 2012-06-29 MED ORDER — DEXTROSE 5 % IV SOLN
1.5000 g | Freq: Two times a day (BID) | INTRAVENOUS | Status: DC
  Administered 2012-06-29 – 2012-06-30 (×2): 1.5 g via INTRAVENOUS
  Filled 2012-06-29 (×3): qty 1.5

## 2012-06-29 MED ORDER — MIDAZOLAM HCL 5 MG/5ML IJ SOLN
INTRAMUSCULAR | Status: DC | PRN
  Administered 2012-06-29 (×2): 2 mg via INTRAVENOUS
  Administered 2012-06-29 (×2): 3 mg via INTRAVENOUS
  Administered 2012-06-29 (×2): 2 mg via INTRAVENOUS

## 2012-06-29 MED ORDER — SODIUM CHLORIDE 0.9 % IV SOLN
INTRAVENOUS | Status: DC
  Administered 2012-06-29: 1 [IU]/h via INTRAVENOUS
  Filled 2012-06-29: qty 1

## 2012-06-29 MED ORDER — ONDANSETRON HCL 4 MG/2ML IJ SOLN: 4.0000 mg | Freq: Four times a day (QID) | INTRAMUSCULAR | Status: DC | PRN

## 2012-06-29 MED ORDER — VANCOMYCIN HCL IN DEXTROSE 1-5 GM/200ML-% IV SOLN
1000.0000 mg | Freq: Once | INTRAVENOUS | Status: AC
  Administered 2012-06-29: 1000 mg via INTRAVENOUS
  Filled 2012-06-29: qty 200

## 2012-06-29 MED ORDER — SODIUM CHLORIDE 0.9 % IV SOLN: INTRAVENOUS | Status: DC

## 2012-06-29 SURGICAL SUPPLY — 91 items
APPLIER CLIP 9.375 SM OPEN (CLIP)
ATTRACTOMAT 16X20 MAGNETIC DRP (DRAPES) ×3 IMPLANT
BAG DECANTER FOR FLEXI CONT (MISCELLANEOUS) ×3 IMPLANT
BANDAGE ELASTIC 4 VELCRO ST LF (GAUZE/BANDAGES/DRESSINGS) IMPLANT
BANDAGE ELASTIC 6 VELCRO ST LF (GAUZE/BANDAGES/DRESSINGS) IMPLANT
BANDAGE GAUZE ELAST BULKY 4 IN (GAUZE/BANDAGES/DRESSINGS) ×6 IMPLANT
BASKET HEART (ORDER IN 25'S) (MISCELLANEOUS) ×1
BASKET HEART (ORDER IN 25S) (MISCELLANEOUS) ×2 IMPLANT
BLADE STERNUM SYSTEM 6 (BLADE) ×3 IMPLANT
BLADE SURG 15 STRL LF DISP TIS (BLADE) ×2 IMPLANT
BLADE SURG 15 STRL SS (BLADE) ×1
CANISTER SUCTION 2500CC (MISCELLANEOUS) ×3 IMPLANT
CATH CPB KIT HENDRICKSON (MISCELLANEOUS) ×3 IMPLANT
CATH ROBINSON RED A/P 18FR (CATHETERS) ×3 IMPLANT
CATH THORACIC 36FR (CATHETERS) ×3 IMPLANT
CATH THORACIC 36FR RT ANG (CATHETERS) ×3 IMPLANT
CLIP APPLIE 9.375 SM OPEN (CLIP) IMPLANT
CLIP TI MEDIUM 24 (CLIP) IMPLANT
CLIP TI WIDE RED SMALL 24 (CLIP) ×9 IMPLANT
CLOTH BEACON ORANGE TIMEOUT ST (SAFETY) ×6 IMPLANT
COVER MAYO STAND STRL (DRAPES) ×3 IMPLANT
COVER SURGICAL LIGHT HANDLE (MISCELLANEOUS) ×3 IMPLANT
CRADLE DONUT ADULT HEAD (MISCELLANEOUS) ×3 IMPLANT
DRAPE CARDIOVASCULAR INCISE (DRAPES) ×1
DRAPE EXTREMITY T 121X128X90 (DRAPE) ×3 IMPLANT
DRAPE PROXIMA HALF (DRAPES) ×6 IMPLANT
DRAPE SLUSH/WARMER DISC (DRAPES) ×3 IMPLANT
DRAPE SRG 135X102X78XABS (DRAPES) ×2 IMPLANT
DRSG COVADERM 4X14 (GAUZE/BANDAGES/DRESSINGS) ×3 IMPLANT
ELECT REM PT RETURN 9FT ADLT (ELECTROSURGICAL) ×6
ELECTRODE REM PT RTRN 9FT ADLT (ELECTROSURGICAL) ×4 IMPLANT
GEL ULTRASOUND 20GR AQUASONIC (MISCELLANEOUS) IMPLANT
GLOVE BIO SURGEON STRL SZ 6 (GLOVE) ×6 IMPLANT
GLOVE BIO SURGEON STRL SZ 6.5 (GLOVE) ×9 IMPLANT
GLOVE BIO SURGEON STRL SZ7.5 (GLOVE) ×6 IMPLANT
GLOVE BIOGEL PI IND STRL 6.5 (GLOVE) ×10 IMPLANT
GLOVE BIOGEL PI INDICATOR 6.5 (GLOVE) ×5
GLOVE EUDERMIC 7 POWDERFREE (GLOVE) ×9 IMPLANT
GOWN PREVENTION PLUS XLARGE (GOWN DISPOSABLE) ×9 IMPLANT
GOWN STRL NON-REIN LRG LVL3 (GOWN DISPOSABLE) ×15 IMPLANT
HEMOSTAT POWDER SURGIFOAM 1G (HEMOSTASIS) ×9 IMPLANT
HEMOSTAT SURGICEL 2X14 (HEMOSTASIS) ×3 IMPLANT
INSERT FOGARTY XLG (MISCELLANEOUS) IMPLANT
KIT BASIN OR (CUSTOM PROCEDURE TRAY) ×3 IMPLANT
KIT ROOM TURNOVER OR (KITS) ×6 IMPLANT
KIT SUCTION CATH 14FR (SUCTIONS) ×6 IMPLANT
KIT VASOVIEW W/TROCAR VH 2000 (KITS) IMPLANT
MARKER GRAFT CORONARY BYPASS (MISCELLANEOUS) ×9 IMPLANT
NS IRRIG 1000ML POUR BTL (IV SOLUTION) ×24 IMPLANT
PACK OPEN HEART (CUSTOM PROCEDURE TRAY) ×3 IMPLANT
PAD ARMBOARD 7.5X6 YLW CONV (MISCELLANEOUS) ×12 IMPLANT
PENCIL BUTTON HOLSTER BLD 10FT (ELECTRODE) ×3 IMPLANT
PUNCH AORTIC ROTATE 4.0MM (MISCELLANEOUS) IMPLANT
PUNCH AORTIC ROTATE 4.5MM 8IN (MISCELLANEOUS) IMPLANT
PUNCH AORTIC ROTATE 5MM 8IN (MISCELLANEOUS) IMPLANT
SET CARDIOPLEGIA MPS 5001102 (MISCELLANEOUS) ×3 IMPLANT
SPONGE GAUZE 4X4 12PLY (GAUZE/BANDAGES/DRESSINGS) ×6 IMPLANT
SUT BONE WAX W31G (SUTURE) ×3 IMPLANT
SUT MNCRL AB 4-0 PS2 18 (SUTURE) IMPLANT
SUT PROLENE 3 0 SH DA (SUTURE) ×3 IMPLANT
SUT PROLENE 4 0 RB 1 (SUTURE)
SUT PROLENE 4 0 SH DA (SUTURE) IMPLANT
SUT PROLENE 4-0 RB1 .5 CRCL 36 (SUTURE) IMPLANT
SUT PROLENE 6 0 C 1 30 (SUTURE) ×6 IMPLANT
SUT PROLENE 7 0 BV1 MDA (SUTURE) ×3 IMPLANT
SUT PROLENE 8 0 BV175 6 (SUTURE) ×27 IMPLANT
SUT SILK  1 MH (SUTURE)
SUT SILK 1 MH (SUTURE) IMPLANT
SUT STEEL 6MS V (SUTURE) ×3 IMPLANT
SUT STEEL STERNAL CCS#1 18IN (SUTURE) IMPLANT
SUT STEEL SZ 6 DBL 3X14 BALL (SUTURE) ×3 IMPLANT
SUT VIC AB 1 CTX 36 (SUTURE) ×2
SUT VIC AB 1 CTX36XBRD ANBCTR (SUTURE) ×4 IMPLANT
SUT VIC AB 2-0 CT1 27 (SUTURE)
SUT VIC AB 2-0 CT1 TAPERPNT 27 (SUTURE) IMPLANT
SUT VIC AB 2-0 CTX 27 (SUTURE) IMPLANT
SUT VIC AB 3-0 SH 27 (SUTURE)
SUT VIC AB 3-0 SH 27X BRD (SUTURE) IMPLANT
SUT VIC AB 3-0 X1 27 (SUTURE) IMPLANT
SUT VICRYL 4-0 PS2 18IN ABS (SUTURE) IMPLANT
SUTURE E-PAK OPEN HEART (SUTURE) ×3 IMPLANT
SYR 50ML SLIP (SYRINGE) IMPLANT
SYSTEM SAHARA CHEST DRAIN ATS (WOUND CARE) ×3 IMPLANT
TAPE CLOTH SURG 4X10 WHT LF (GAUZE/BANDAGES/DRESSINGS) ×3 IMPLANT
TOWEL OR 17X24 6PK STRL BLUE (TOWEL DISPOSABLE) ×6 IMPLANT
TOWEL OR 17X26 10 PK STRL BLUE (TOWEL DISPOSABLE) ×6 IMPLANT
TRAY FOLEY IC TEMP SENS 14FR (CATHETERS) ×3 IMPLANT
TUBE FEEDING 8FR 16IN STR KANG (MISCELLANEOUS) ×3 IMPLANT
TUBING INSUFFLATION 10FT LAP (TUBING) ×3 IMPLANT
UNDERPAD 30X30 INCONTINENT (UNDERPADS AND DIAPERS) ×3 IMPLANT
WATER STERILE IRR 1000ML POUR (IV SOLUTION) ×6 IMPLANT

## 2012-06-29 NOTE — OR Nursing (Signed)
2300 called at 1149 to notify of 20 minute call.

## 2012-06-29 NOTE — Anesthesia Preprocedure Evaluation (Addendum)
Anesthesia Evaluation  Patient identified by MRN, date of birth, ID band Patient awake    Reviewed: Allergy & Precautions, H&P , NPO status , Patient's Chart, lab work & pertinent test results  Airway Mallampati: II TM Distance: >3 FB Neck ROM: Full    Dental  (+) Teeth Intact   Pulmonary  breath sounds clear to auscultation        Cardiovascular hypertension, Pt. on medications + CAD Rhythm:Regular Rate:Normal     Neuro/Psych  Headaches,    GI/Hepatic negative GI ROS, Neg liver ROS,   Endo/Other  negative endocrine ROS  Renal/GU negative Renal ROS     Musculoskeletal   Abdominal   Peds  Hematology   Anesthesia Other Findings   Reproductive/Obstetrics                          Anesthesia Physical Anesthesia Plan  ASA: III  Anesthesia Plan: General   Post-op Pain Management:    Induction: Intravenous  Airway Management Planned: Oral ETT  Additional Equipment: Arterial line, CVP and PA Cath  Intra-op Plan:   Post-operative Plan: Post-operative intubation/ventilation  Informed Consent: I have reviewed the patients History and Physical, chart, labs and discussed the procedure including the risks, benefits and alternatives for the proposed anesthesia with the patient or authorized representative who has indicated his/her understanding and acceptance.   Dental advisory given  Plan Discussed with: Anesthesiologist, Surgeon and CRNA  Anesthesia Plan Comments:        Anesthesia Quick Evaluation

## 2012-06-29 NOTE — Transfer of Care (Signed)
Immediate Anesthesia Transfer of Care Note  Patient: Terry Hansen  Procedure(s) Performed: Procedure(s) (LRB) with comments: CORONARY ARTERY BYPASS GRAFTING (CABG) (N/A) - times two using Left Internal Mammary Artery  Patient Location: SICU  Anesthesia Type:General  Level of Consciousness: Patient remains intubated per anesthesia plan  Airway & Oxygen Therapy: Patient remains intubated per anesthesia plan and Patient placed on Ventilator (see vital sign flow sheet for setting)  Post-op Assessment: Report given to PACU RN and Post -op Vital signs reviewed and stable  Post vital signs: Reviewed and stable  Complications: No apparent anesthesia complications

## 2012-06-29 NOTE — Anesthesia Postprocedure Evaluation (Signed)
  Anesthesia Post-op Note  Patient: Terry Hansen  Procedure(s) Performed: Procedure(s) (LRB) with comments: CORONARY ARTERY BYPASS GRAFTING (CABG) (N/A) - times two using Left Internal Mammary Artery  Patient Location: PACU and SICU  Anesthesia Type:General  Level of Consciousness: Patient remains intubated per anesthesia plan  Airway and Oxygen Therapy: Patient remains intubated per anesthesia plan  Post-op Pain: mild  Post-op Assessment: Post-op Vital signs reviewed  Post-op Vital Signs: Reviewed  Complications: No apparent anesthesia complications

## 2012-06-29 NOTE — Procedures (Signed)
Extubation Procedure Note  Patient Details:   Name: Terry Hansen DOB: November 02, 1955 MRN: [id number]  Pt extubated to 4L [place] per protocol. NIF, VC, VS WNL, pt able to vocalize, no stridor noted, RT to monitor.     Evaluation  O2 sats: stable throughout Complications: No apparent complications Patient did tolerate procedure well. Bilateral Breath Sounds: Diminished   Yes  Harley Hallmark 06/29/2012, 5:46 PM

## 2012-06-29 NOTE — OR Nursing (Signed)
Volunteer desk called at 1128 to notify family off pump.  2300 called at 1128 to notify of 45 minute call; spoke with Psychiatrist.

## 2012-06-29 NOTE — Interval H&P Note (Signed)
History and Physical Interval Note:  06/29/2012 7:45 AM  Terry Hansen  has presented today for surgery, with the diagnosis of cad  The various methods of treatment have been discussed with the patient and family. After consideration of risks, benefits and other options for treatment, the patient has consented to  Procedure(s) (LRB) with comments: CORONARY ARTERY BYPASS GRAFTING (CABG) (N/A) RADIAL ARTERY HARVEST (Left) as a surgical intervention .  The patient's history has been reviewed, patient examined, no change in status, stable for surgery.  I have reviewed the patient's chart and labs.  Questions were answered to the patient's satisfaction.     [name]  Normal Allen's test confirmed with pulse ox

## 2012-06-29 NOTE — Progress Notes (Signed)
Extubated  BP 143/83  Pulse 80  Temp 98.6 F (37 C) (Core (Comment))  Resp 18  Ht 5' 10.9" (1.801 m)  Wt 189 lb 9.5 oz (86 kg)  BMI 26.52 kg/m2  SpO2 100%  CI > 2   Intake/Output Summary (Last 24 hours) at 06/29/12 1751 Last data filed at 06/29/12 1700  Gross per 24 hour  Intake 7333.76 ml  Output   5220 ml  Net 2113.76 ml    ECG shows 1 mm ST elevation in AVL and I. No change from preop in precordial leads. No arrhythmias or problems with cardiac index.  Doing well early postop

## 2012-06-29 NOTE — Anesthesia Procedure Notes (Signed)
Procedures PA catheter:  Routine monitors. Timeout, sterile prep, drape, FBP R neck.  Trendelenburg position.  1% Lido local, finder and trocar RIJ 1st pass with US guidance. Double lumen Cordis placed over J wire. PA catheter in easily.  Sterile dressing applied.  Patient tolerated well, VSS.  Sandford Craze, MD  [phone]

## 2012-06-29 NOTE — Op Note (Signed)
NAMEMarland Kitchen  NESTA, KIMPLE NO.:  [id number]  MEDICAL RECORD NO.:  [id number]  LOCATION:  2308                         FACILITY:  [facility]  PHYSICIAN:  [name]. Dorris Fetch, M.D.DATE OF BIRTH:  September 04, 1966  DATE OF PROCEDURE:  06/29/2012 DATE OF DISCHARGE:                              OPERATIVE REPORT   PREOPERATIVE DIAGNOSIS:  Severe two-vessel coronary artery disease with unstable angina.  POSTOPERATIVE DIAGNOSIS:  Severe two-vessel coronary artery disease with unstable angina.  PROCEDURE:  Median sternotomy, extracorporeal circulation, coronary artery bypass grafting x2 (sequential left internal mammary artery to the first diagonal and LAD).  SURGEON:  [name]. Dorris Fetch, M.D.  ASSISTANT:  [name], P.A.-C.  ANESTHESIA:  General.  FINDINGS:  Target vessels relatively small, but did accept 1.5-mm probe and were good quality, mammary was also good quality.  No graftable targets in the distribution of the totally-occluded nondominant right coronary artery.  CLINICAL NOTE:  Mr. Zingg is a 56 year old gentleman with a strong family history of coronary artery disease who presented after an episode of acute chest pain lasting 1-1/2 hours.  Cardiac enzymes were negative, but an EKG did show an incomplete right bundle-branch block and nonspecific ST-T changes.  A cardiac CT showed advanced obstructive coronary artery disease and he underwent cardiac catheterization where he was found to have severe two-vessel coronary artery disease with a totally occluded nondominant right coronary artery and a severe stenosis in the proximal LAD involving the takeoff of a large first diagonal.  He was advised to undergo coronary artery bypass grafting.  The indications, risks, benefits, and alternatives were discussed in detail with the patient as well as potential use of the left radial artery.  He understood the risks of surgery as well as the intraoperative  decision- making and agreed to proceed.  OPERATIVE NOTE:  Mr. Bresee was brought to the preoperative holding area on June 29, 2012, there Anesthesia placed a Swan-Ganz catheter and an arterial blood pressure monitoring line.  Intravenous antibiotics were administered.  He was taken to the operating room, anesthetized, and intubated.  A Foley catheter was placed.  The chest, abdomen, legs and left arm were prepped and draped in usual sterile fashion.  A median sternotomy was performed.  The pericardium was opened.  The heart was gently lifted.  The diagonal and LAD did appear suitable for sequential mammary grafting.  Inspection of the acute margin and inferior wall revealed no graftable vessels from the nondominant right distribution.  The left internal mammary artery then was harvested using standard technique.  It was a good quality vessel.  2000 units of heparin were administered during the vessel harvest, the remainder of the full heparin dose was given prior to cannulating.  After harvesting the mammary conduit, the sternal retractor was placed. A window was created in the lateral portion of the pericardium for passage of the mammary artery.  Final inspection was made to ensure that there was sufficient length and there in fact was.  The remainder of the full heparin dose then was given.  After confirming adequate anticoagulation, the aorta was cannulated via concentric 2-0 Ethibond pledgeted pursestring sutures.  The aorta was normal.  A dual-stage  venous cannula was placed via pursestring suture in the right atrial appendage.  Cardiopulmonary bypass was begun.  Flows were maintained per protocol.  The heart was again elevated.  The LAD and diagonal were inspected for sites of anastomosis, these were marked.  The mammary was measured and marked.  A foam pad was placed in the pericardium to insulate the heart and protect the left phrenic nerve.  A temperature probe was  placed in the myocardial septum and a cardioplegic cannula was placed in the ascending aorta.  The aorta was crossclamped.  The left ventricle was emptied via the aortic root vent.  Cardiac arrest then was achieved with combination of cold antegrade blood cardioplegia and topical iced saline.  There was a rapid diastolic arrest, but relatively slow septal cooling, but eventually there was septal cooling to 11 degrees Celsius.  The following distal anastomoses were performed.  The left internal mammary artery was brought through a window in the pericardium with the site marked for anastomosing to the diagonal.  A longitudinal arteriotomy was made.  A side-to-side anastomosis then was created.  The diagonal was a 1.5-mm vessel, it was thin walled, but had no atherosclerotic disease at the site of the anastamosis.  The side-to-side anastomosis was performed with a running 8-0 Prolene suture.  The bulldog was briefly removed and there was good initial hemostasis at the anastomosis.  The bulldog clamp was replaced.  The distal end of the mammary then was divided at the site for the LAD anastomosis and beveled.  An arteriotomy was made in the LAD, this vessel also accepted a 1.5-mm probe and was also free of disease at the site of the anastomosis.  The end-to-side anastomosis was performed with a running 8-0 Prolene suture.  At the completion of the mammary-to-LAD anastomosis, the bulldog clamp was again removed.  Immediate and rapid septal rewarming was noted.  There was some bleeding from a branch of the mammary, which was controlled with a 2-0 Prolene suture.  There was good hemostasis at the anastomosis.  The mammary pedicle was tacked to the epicardial surface of the heart with 6- 0 Prolene sutures.  Lidocaine was administered and the aortic crossclamp was removed, the total crossclamp time was 36 minutes.  While rewarming, a final inspection was made of both distal anastomoses, there  was some bleeding from the lateral aspect of the mammary to diagonal anastomosis, which was controlled with a single 8-0 Prolene suture.  The mammary pedicle was tacked to the epicardial surface of the heart at that site as well.  Epicardial pacing wires were placed on the right ventricle and right atrium.  Atrial pacing was initiated at 80 beats per minute.  The patient was in sinus rhythm in the 60s prior to pacing.  The patient was then weaned from cardiopulmonary bypass on the first attempt without difficulty.  The total bypass time was 82 minutes. The initial cardiac output was 6 liters/minute and the patient remained hemodynamically stable throughout the postbypass period.  A test dose of protamine was administered and was well tolerated.  The atrial and aortic cannulae were removed.  The remainder of the protamine was administered without incident.  Chest was irrigated with warm saline.  Hemostasis was achieved.  The pericardium was reapproximated with interrupted 3-0 silk sutures, it came together easily without tension or kinking of the mammary artery.  The left pleural and single mediastinal chest tubes were placed though separate subcostal incisions and secured with #1 silk sutures.  The sternum was closed with interrupted heavy gauge stainless steel wires.  Pectoralis fascia, subcutaneous tissue, and skin were closed in standard fashion.  All sponge, needle, and instrument counts were correct.  At the end of the procedure, the patient was taken from the operating room to the surgical intensive care unit in good condition.     [name] Dorris Fetch, M.D.     SCH/MEDQ  D:  06/29/2012  T:  06/29/2012  Job:  [id number]

## 2012-06-29 NOTE — OR Nursing (Signed)
2300 called at 1216 for on the way.  Spoke with Psychiatrist.

## 2012-06-29 NOTE — H&P (Signed)
Reason for Consult:2 vessel CAD Referring Physician: Dr. Nishan  Terry Hansen is an 56 y.o. male.  HPI: 56 yo WM who presents with a cc/o CP.   Terry Hansen is a 56 y/o M with history of HTN and a strong family history of CAD (brother had CABG age 34 with later redo CABG). He has been feeling poorly since having viral illness in September. He has had intermittent dry cough and nasal drainage. He was recently given a short course of prednisone and was started on Zyrtec and chlorthalidone.   Yesterday after work he began to experience scotoma. It resolved but then recurred and was followed by a severe headache. He felt weak, shaky, and dizzy. He took Advil and ate dinner with some relief. This morning around 4:45am, he was awakened from sleep with substernal chest pain/tightness and feeling of being generally weak. + nausea, no vomiting, SOB or diaphoresis. The pain did not radiate. The episode lasted 1.5 hours and he came to the ED. He was fund to be hyponatremic (123) and hypokalemic (2.8). Cardiac enzymes were negative x 2. An EKG showed IRBBB with nonspecific ST-T changes. Cardiac CT showed age advanced obstructive coronary artery disease with mixed plaque in the proximal LAD that results in greater than 75% luminal stenosis, along with other areas of extensive mixed plaque. It also showed aortic valvular calcifications.   He underwent cardiac catheterization which showed severe 2 vessel CAD with a totally occluded nondominant RCA, and severe stenosis in the proximal LAD, involving the takeoff of a large D1. The dominant circumflex had multiple non flow-limiting plaques. LAD and LCX arose from separate ostia.  He currently is pain free.  Past Medical History  Diagnosis Date  . Hypertension   . Ocular migraine     Past Surgical History  Procedure Date  . Appendectomy   . Nasal septum surgery     For uncontrolled epistaxis early 2013    Family History  Problem Relation Age of Onset  .  Heart disease Brother     CABG age 34, MI ~42, redo bypass ~50  . Heart disease Paternal Grandmother   . Heart disease Cousin     Maternal side    Social History:  reports that he has never smoked. He does not have any smokeless tobacco history on file. He reports that he drinks alcohol. He reports that he does not use illicit drugs.  Allergies: No Known Allergies  Medications:  Prior to Admission:  Prescriptions prior to admission  Medication Sig Dispense Refill  . cetirizine (ZYRTEC) 10 MG tablet Take 10 mg by mouth daily.      . chlorthalidone (HYGROTON) 25 MG tablet Take 25 mg by mouth daily.      . Coenzyme Q10 200 MG capsule Take 200 mg by mouth daily. disontinue while in hospital      . ibuprofen (ADVIL,MOTRIN) 200 MG tablet Take 600 mg by mouth every 6 (six) hours as needed. migraine headache      . predniSONE (DELTASONE) 10 MG tablet Take 20 mg by mouth daily. Patient course completed 06-24-2012      . aspirin 81 MG tablet Take 81 mg by mouth daily.      . diphenhydrAMINE (BENADRYL) 50 MG capsule Take 50 mg by mouth at bedtime. For sleep        Results for orders placed during the hospital encounter of 06/25/12 (from the past 48 hour(s))  CBC WITH DIFFERENTIAL     Status: Abnormal     Collection Time   06/25/12  6:20 AM      Component Value Range Comment   WBC 10.0  4.0 - 10.5 K/uL    RBC 5.75  4.22 - 5.81 MIL/uL    Hemoglobin 17.1 (*) 13.0 - 17.0 g/dL    HCT 44.6  39.0 - 52.0 %    MCV 77.6 (*) 78.0 - 100.0 fL    MCH 29.7  26.0 - 34.0 pg    MCHC 38.3 (*) 30.0 - 36.0 g/dL RULED OUT INTERFERING SUBSTANCES   RDW 12.4  11.5 - 15.5 %    Platelets 310  150 - 400 K/uL    Neutrophils Relative 47  43 - 77 %    Neutro Abs 4.7  1.7 - 7.7 K/uL    Lymphocytes Relative 40  12 - 46 %    Lymphs Abs 4.0  0.7 - 4.0 K/uL    Monocytes Relative 11  3 - 12 %    Monocytes Absolute 1.1 (*) 0.1 - 1.0 K/uL    Eosinophils Relative 2  0 - 5 %    Eosinophils Absolute 0.2  0.0 - 0.7 K/uL     Basophils Relative 0  0 - 1 %    Basophils Absolute 0.0  0.0 - 0.1 K/uL    Smear Review MORPHOLOGY UNREMARKABLE     BASIC METABOLIC PANEL     Status: Abnormal   Collection Time   06/25/12  6:20 AM      Component Value Range Comment   Sodium 124 (*) 135 - 145 mEq/L    Potassium 2.8 (*) 3.5 - 5.1 mEq/L    Chloride 86 (*) 96 - 112 mEq/L    CO2 29  19 - 32 mEq/L    Glucose, Bld 105 (*) 70 - 99 mg/dL    BUN 16  6 - 23 mg/dL    Creatinine, Ser 0.90  0.50 - 1.35 mg/dL    Calcium 8.8  8.4 - 10.5 mg/dL    GFR calc non Af Amer >90  >90 mL/min    GFR calc Af Amer >90  >90 mL/min   TROPONIN I     Status: Normal   Collection Time   06/25/12  6:20 AM      Component Value Range Comment   Troponin I <0.30  <0.30 ng/mL   BASIC METABOLIC PANEL     Status: Abnormal   Collection Time   06/25/12  6:55 AM      Component Value Range Comment   Sodium 123 (*) 135 - 145 mEq/L    Potassium 3.0 (*) 3.5 - 5.1 mEq/L    Chloride 87 (*) 96 - 112 mEq/L    CO2 28  19 - 32 mEq/L    Glucose, Bld 133 (*) 70 - 99 mg/dL    BUN 15  6 - 23 mg/dL    Creatinine, Ser 0.80  0.50 - 1.35 mg/dL    Calcium 8.6  8.4 - 10.5 mg/dL    GFR calc non Af Amer >90  >90 mL/min    GFR calc Af Amer >90  >90 mL/min   POCT I-STAT TROPONIN I     Status: Normal   Collection Time   06/25/12 11:02 AM      Component Value Range Comment   Troponin i, poc 0.00  0.00 - 0.08 ng/mL    Comment 3            POCT I-STAT, CHEM 8     Status:   Abnormal   Collection Time   06/25/12 11:05 AM      Component Value Range Comment   Sodium 126 (*) 135 - 145 mEq/L    Potassium 3.4 (*) 3.5 - 5.1 mEq/L    Chloride 90 (*) 96 - 112 mEq/L    BUN 12  6 - 23 mg/dL    Creatinine, Ser 1.00  0.50 - 1.35 mg/dL    Glucose, Bld 104 (*) 70 - 99 mg/dL    Calcium, Ion 1.10 (*) 1.12 - 1.23 mmol/L    TCO2 26  0 - 100 mmol/L    Hemoglobin 16.3  13.0 - 17.0 g/dL    HCT 48.0  39.0 - 52.0 %   TROPONIN I     Status: Normal   Collection Time   06/25/12 11:47 PM       Component Value Range Comment   Troponin I <0.30  <0.30 ng/mL   PROTIME-INR     Status: Normal   Collection Time   06/25/12 11:48 PM      Component Value Range Comment   Prothrombin Time 13.3  11.6 - 15.2 seconds    INR 1.02  0.00 - 1.49   HEMOGLOBIN A1C     Status: Normal   Collection Time   06/25/12 11:48 PM      Component Value Range Comment   Hemoglobin A1C 5.6  <5.7 %    Mean Plasma Glucose 114  <117 mg/dL   HEPATIC FUNCTION PANEL     Status: Abnormal   Collection Time   06/25/12 11:48 PM      Component Value Range Comment   Total Protein 6.2  6.0 - 8.3 g/dL    Albumin 3.3 (*) 3.5 - 5.2 g/dL    AST 12  0 - 37 U/L    ALT 15  0 - 53 U/L    Alkaline Phosphatase 92  39 - 117 U/L    Total Bilirubin 0.3  0.3 - 1.2 mg/dL    Bilirubin, Direct <0.1  0.0 - 0.3 mg/dL    Indirect Bilirubin NOT CALCULATED  0.3 - 0.9 mg/dL   TROPONIN I     Status: Normal   Collection Time   06/26/12  4:50 AM      Component Value Range Comment   Troponin I <0.30  <0.30 ng/mL   CBC     Status: Abnormal   Collection Time   06/26/12  4:50 AM      Component Value Range Comment   WBC 8.7  4.0 - 10.5 K/uL    RBC 5.48  4.22 - 5.81 MIL/uL    Hemoglobin 16.4  13.0 - 17.0 g/dL    HCT 44.9  39.0 - 52.0 %    MCV 81.9  78.0 - 100.0 fL    MCH 29.9  26.0 - 34.0 pg    MCHC 36.5 (*) 30.0 - 36.0 g/dL    RDW 12.5  11.5 - 15.5 %    Platelets 271  150 - 400 K/uL   BASIC METABOLIC PANEL     Status: Normal   Collection Time   06/26/12  4:50 AM      Component Value Range Comment   Sodium 135  135 - 145 mEq/L DELTA CHECK NOTED   Potassium 4.2  3.5 - 5.1 mEq/L    Chloride 102  96 - 112 mEq/L DELTA CHECK NOTED   CO2 23  19 - 32 mEq/L    Glucose, Bld 95  70 -   99 mg/dL    BUN 10  6 - 23 mg/dL    Creatinine, Ser 0.84  0.50 - 1.35 mg/dL    Calcium 8.7  8.4 - 10.5 mg/dL    GFR calc non Af Amer >90  >90 mL/min    GFR calc Af Amer >90  >90 mL/min   LIPID PANEL     Status: Normal   Collection Time   06/26/12  4:50 AM       Component Value Range Comment   Cholesterol 145  0 - 200 mg/dL    Triglycerides 83  <150 mg/dL    HDL 40  >39 mg/dL    Total CHOL/HDL Ratio 3.6      VLDL 17  0 - 40 mg/dL    LDL Cholesterol 88  0 - 99 mg/dL     Dg Chest 2 View  06/25/2012  *RADIOLOGY REPORT*  Clinical Data: Chest pain and cough.  CHEST - 2 VIEW  Comparison: None.  Findings: The heart size and pulmonary vascularity are normal and the lungs are clear.  No osseous abnormality.  IMPRESSION: Normal chest.   Original Report Authenticated By: James Maxwell, M.D.    Ct Heart Morp W/cta Cor W/score W/ca W/cm &/or Wo/cm  06/25/2012  *RADIOLOGY REPORT*  INDICATION: Upper chest pain.  Severe family history of coronary artery disease.  Hyperlipidemia and hypertension.  CT ANGIOGRAPHY OF THE HEART, CORONARY ARTERY, STRUCTURE, AND MORPHOLOGY  COMPARISON:  Plain film of earlier in the day.  No prior CT.  CONTRAST: 80mL OMNIPAQUE IOHEXOL 350 MG/ML SOLN  TECHNIQUE:  CT angiography of the coronary vessels was performed on a 256 channel system using prospective ECG gating.  A scout and ECG- gated noncontrast exam (for calcium scoring) were performed. Appropriate delay was determined by bolus tracking after injection of iodinated contrast, and an ECG-gated coronary CTA was performed with sub-mm slice collimation during late diastole.  Imaging post processing was performed on an independent workstation creating multiplanar and 3-D images, allowing for quantitative analysis of the heart and coronary arteries.  Note that this exam targets the heart and the chest was not imaged in its entirety.  PREMEDICATION: Lopressor  100 mg, P.O. Lopressor  2.5 mg, IV Nitroglycerin  0.4  mcg, sublingual.  FINDINGS: Technical quality: Good. Heart rate:  60.  CORONARY ARTERIES: Left Main:              Absent (separate origin of the LAD and left circumflex.) LAD:              Has calcified and noncalcified plaque just distal to its origin.  Positive remodeling.  No stenosis  proximally.  Just distal to the origin of a first diagonal is a calcified and noncalcified plaque which causes greater than 75% stenosis. Opacified distally with areas of primarily calcified plaque identified. Diagonals:              Patent diminutive first diagonal. Extensive calcified and noncalcified plaque within the second diagonal proximally.  This branches and has both calcified and noncalcified plaque throughout its distal course.  Patent. LCx:              Large, dominant vessel which arises immediately posterior to the LAD from the aorta.  Mild misregistration in its mid to distal course.  Without significant stenosis, continues distally to supply posterolateral branches and the PDA. OMs:              Branching large first marginal which has   calcified plaque in its proximal and midcourse with positive remodeling.  No definite stenosis.  Mild misregistration. A second marginal is moderate sized patent. RCA:              Small to moderate sized, nondominant vessel. Calcified and noncalcified plaque with positive remodeling just distal to its origin.  Poorly evaluated in its mid to distal course secondary to small size and misregistration artifact.  No definite high-grade stenosis. PDA:                    Moderate sized and supplied by the left circumflex. Dominance:        Left-sided.  CORONARY CALCIUM: Total Agatston Score:         151 (68.7 in the LAD, 61 in the diagonals, 12.70 in the left circumflex, and 8.9 in the RCA. MESA database percentile:     79th, corresponding to an "arterial age" of 74 years.  AORTA AND PULMONARY MEASUREMENTS:  Aortic root (21 - 40 mm):     25 mm at the annulus                               36 mm at the sinuses of Valsalva                               22 mm at the sinotubular  junction                                Ascending aorta:  (< 40 mm):  25 mm Descending aorta:  (< 40 mm): 24 mm Main pulmonary artery: (< 30 mm): 25 mm  OTHER FINDINGS: Lungs/pleura: Volume loss and  subsegmental atelectasis or scarring at the right greater than left lung bases.  No pleural fluid.  Heart/Mediastinum: No imaged thoracic adenopathy.  No aortic dissection. No central pulmonary embolism, on this non-dedicated study.  No cardiac mass.  No left atrial appendage thrombus. Calcifications involving the aortic valve leaflets, most apparent on coronal image 30/series 8088.  Upper abdomen: No significant findings.  Bones/Musculoskeletal:  No acute osseous abnormality.  IMPRESSION:  1.  Age advanced, obstructive coronary artery disease.  There is mixed plaque in the proximal LAD that results in greater than 75% luminal stenosis. 2.  Other areas of extensive mixed plaque with foci of positive remodeling. 3.  Separate origin of the LAD and left circumflex, left dominant system. 4.  Aortic valvular calcifications.  Consider non emergent echocardiography to exclude aortic valve disease. 5.  Suboptimal evaluation, especially of the right coronary artery secondary to mild misregistration artifact.  Report was called to CDU mid level, Sherry at 3:27 p.m. on 06/25/2012.   Original Report Authenticated By: Kyle Talbot, M.D.     Review of Systems  Constitutional: Positive for malaise/fatigue.  HENT: Positive for nosebleeds.        Sinus surgery  Respiratory: Positive for cough and shortness of breath.   Cardiovascular: Positive for chest pain.  Gastrointestinal: Positive for nausea.  Genitourinary: Negative.   Musculoskeletal: Negative.   Neurological: Positive for dizziness, tremors and headaches.  Endo/Heme/Allergies: Negative.   All other systems reviewed and are negative.   Blood pressure 117/77, pulse 92, temperature 98.1 F (36.7 C), temperature source Oral, resp. rate 18, height 5' 11" (1.803 m), weight 189 lb   9.5 oz (86 kg), SpO2 97.00%. Physical Exam  Vitals reviewed. Constitutional: He is oriented to person, place, and time. He appears well-developed and well-nourished.  HENT:  Head:  Normocephalic and atraumatic.  Eyes: EOM are normal. Pupils are equal, round, and reactive to light.  Neck: Neck supple. No thyromegaly present.       No carotid bruits  Cardiovascular: Normal rate, regular rhythm, normal heart sounds and intact distal pulses.        Normal Allen's test left hand  Respiratory: Breath sounds normal. He has no wheezes. He has no rales.  GI: Soft. There is no tenderness.  Musculoskeletal: Normal range of motion. He exhibits no edema.  Lymphadenopathy:    He has no cervical adenopathy.  Neurological: He is alert and oriented to person, place, and time. No cranial nerve deficit.  Skin: Skin is warm and dry.    Assessment/Plan: 56 yo WM with unstable angina and severe 2 vessel CAD by catheterization. CAD involves a nondominant RCA- unclear if there are any graftable targets in that distribution. Also complex proximal LAD/ Diagonal disease not amenable to PTCA. We will definitely graft LAD and diagonal and will graft RCA branch if possible.  CABG indicated for symptom relief and survival benefit.  I have discussed with the patient the general nature of the procedure, need for general anesthesia, and incisions to be used. We discussed possible use of the left radial artery, as he does have a normal Allen's test. We will assess intraoperatively to determine whether sequential LIMA grafting of diagonal and LAD is possible. I also discussed the expected hospital stay, overall recovery and short and long term outcomes. He understands the risks include but are not limited to death, stroke, MI, DVT/PE, bleeding, possible need for transfusion, infections, cardiac arrhythmias, and other organ system dysfunction including respiratory, renal, or GI complications. He accepts these risks and agrees to proceed.  For CABG, possible left radial artery harvest Monday 12/9  HENDRICKSON,STEVEN C 06/26/2012, 2:13 PM      

## 2012-06-29 NOTE — Brief Op Note (Addendum)
06/25/2012 - 06/29/2012  11:10 AM  PATIENT:  Terry Hansen  56 y.o. male  PRE-OPERATIVE DIAGNOSIS:  cad  POST-OPERATIVE DIAGNOSIS:  Coronary Artery Disease  PROCEDURE:  Procedure(s): CORONARY ARTERY BYPASS GRAFTING (CABG)X2 SEQUENTIAL LIMA-DIAG-LAD  SURGEON:  Surgeon(s): Loreli Slot, MD  PHYSICIAN ASSISTANT: WAYNE GOLD PA-C  ANESTHESIA:   general  PATIENT CONDITION:  ICU - intubated and hemodynamically stable.  PRE-OPERATIVE WEIGHT: 86kg  COMPLICATIONS: NO KNOWN    Targets small, but good quality/ LIMA good quality No graftable target in nondominant RCA distribution

## 2012-06-30 ENCOUNTER — Encounter (HOSPITAL_COMMUNITY): Payer: Self-pay | Admitting: Thoracic Surgery (Cardiothoracic Vascular Surgery)

## 2012-06-30 ENCOUNTER — Inpatient Hospital Stay (HOSPITAL_COMMUNITY): Payer: No Typology Code available for payment source

## 2012-06-30 LAB — CBC
HCT: 35.1 % — ABNORMAL LOW (ref 39.0–52.0)
Hemoglobin: 12.1 g/dL — ABNORMAL LOW (ref 13.0–17.0)
Hemoglobin: 12.6 g/dL — ABNORMAL LOW (ref 13.0–17.0)
MCH: 29.2 pg (ref 26.0–34.0)
MCHC: 34.5 g/dL (ref 30.0–36.0)
MCHC: 34.5 g/dL (ref 30.0–36.0)
MCV: 84.4 fL (ref 78.0–100.0)
MCV: 84.7 fL (ref 78.0–100.0)
RBC: 4.31 MIL/uL (ref 4.22–5.81)
RDW: 12.9 % (ref 11.5–15.5)

## 2012-06-30 LAB — BASIC METABOLIC PANEL
BUN: 8 mg/dL (ref 6–23)
Creatinine, Ser: 0.78 mg/dL (ref 0.50–1.35)
GFR calc Af Amer: >90 mL/min (ref >90)
GFR calc non Af Amer: >90 mL/min (ref >90)
Glucose, Bld: 115 mg/dL — ABNORMAL HIGH (ref 70–99)

## 2012-06-30 LAB — GLUCOSE, CAPILLARY
Glucose-Capillary: 109 mg/dL — ABNORMAL HIGH (ref 70–99)
Glucose-Capillary: 115 mg/dL — ABNORMAL HIGH (ref 70–99)
Glucose-Capillary: 97 mg/dL (ref 70–99)
Glucose-Capillary: 102 mg/dL — ABNORMAL HIGH (ref 70–99)
Glucose-Capillary: 122 mg/dL — ABNORMAL HIGH (ref 70–99)
Glucose-Capillary: 128 mg/dL — ABNORMAL HIGH (ref 70–99)

## 2012-06-30 MED ORDER — SODIUM CHLORIDE 0.9 % IJ SOLN: 3.0000 mL | INTRAMUSCULAR | Status: DC | PRN

## 2012-06-30 MED ORDER — ENOXAPARIN SODIUM 40 MG/0.4ML ~~LOC~~ SOLN
40.0000 mg | SUBCUTANEOUS | Status: DC
  Administered 2012-06-30 – 2012-07-02 (×3): 40 mg via SUBCUTANEOUS
  Filled 2012-06-30 (×4): qty 0.4

## 2012-06-30 MED ORDER — SODIUM CHLORIDE 0.9 % IV SOLN: 250.0000 mL | INTRAVENOUS | Status: DC | PRN

## 2012-06-30 MED ORDER — SODIUM CHLORIDE 0.9 % IJ SOLN
3.0000 mL | Freq: Two times a day (BID) | INTRAMUSCULAR | Status: DC
  Administered 2012-06-30 – 2012-07-03 (×6): 3 mL via INTRAVENOUS

## 2012-06-30 MED ORDER — INSULIN ASPART 100 UNIT/ML ~~LOC~~ SOLN
0.0000 [IU] | SUBCUTANEOUS | Status: DC
  Administered 2012-06-30 – 2012-07-01 (×4): 2 [IU] via SUBCUTANEOUS

## 2012-06-30 MED ORDER — MAGNESIUM HYDROXIDE 400 MG/5ML PO SUSP: 30.0000 mL | Freq: Every day | ORAL | Status: DC | PRN

## 2012-06-30 MED ORDER — MOVING RIGHT ALONG BOOK
Freq: Once | Status: AC
  Administered 2012-06-30: 22:00:00
  Filled 2012-06-30: qty 1

## 2012-06-30 MED ORDER — ALUM & MAG HYDROXIDE-SIMETH 200-200-20 MG/5ML PO SUSP: 15.0000 mL | ORAL | Status: DC | PRN

## 2012-06-30 MED ORDER — ALPRAZOLAM 0.25 MG PO TABS: 0.2500 mg | ORAL_TABLET | Freq: Four times a day (QID) | ORAL | Status: DC | PRN

## 2012-06-30 MED ORDER — DIPHENHYDRAMINE HCL 25 MG PO CAPS: 50.0000 mg | ORAL_CAPSULE | Freq: Every evening | ORAL | Status: DC | PRN

## 2012-06-30 MED ORDER — METOPROLOL TARTRATE 25 MG PO TABS
25.0000 mg | ORAL_TABLET | Freq: Two times a day (BID) | ORAL | Status: DC
  Administered 2012-06-30 (×2): 25 mg via ORAL
  Filled 2012-06-30 (×4): qty 1

## 2012-06-30 MED ORDER — METOPROLOL TARTRATE 25 MG/10 ML ORAL SUSPENSION
25.0000 mg | Freq: Two times a day (BID) | ORAL | Status: DC
  Filled 2012-06-30 (×4): qty 10

## 2012-06-30 MED ORDER — GUAIFENESIN-DM 100-10 MG/5ML PO SYRP: 15.0000 mL | ORAL_SOLUTION | ORAL | Status: DC | PRN

## 2012-06-30 MED ORDER — FUROSEMIDE 10 MG/ML IJ SOLN
20.0000 mg | Freq: Once | INTRAMUSCULAR | Status: AC
  Administered 2012-06-30: 20 mg via INTRAVENOUS
  Filled 2012-06-30: qty 2

## 2012-06-30 MED ORDER — POTASSIUM CHLORIDE 10 MEQ/50ML IV SOLN
10.0000 meq | INTRAVENOUS | Status: AC
  Administered 2012-06-30 (×4): 10 meq via INTRAVENOUS
  Filled 2012-06-30: qty 200

## 2012-06-30 NOTE — Progress Notes (Signed)
Patient transferred to 2016 via wheelchair on room air.  VSS, tolerated well.

## 2012-06-30 NOTE — Progress Notes (Addendum)
1 Day Post-Op Procedure(s) (LRB): CORONARY ARTERY BYPASS GRAFTING (CABG) (N/A) Subjective:  Terry Hansen complains of some soreness this morning.  Otherwise he states he is doing well.  Objective: Vital signs in last 24 hours: Temp:  [96.1 F (35.6 C)-100 F (37.8 C)] 98.8 F (37.1 C) (12/10 0700) Pulse Rate:  [79-94] 89  (12/10 0700) Cardiac Rhythm:  [-] Atrial paced (12/09 2000) Resp:  [12-23] 20  (12/10 0700) BP: (71-143)/(50-83) 95/65 mmHg (12/10 0600) SpO2:  [92 %-100 %] 95 % (12/10 0700) Arterial Line BP: (83-142)/(48-81) 117/57 mmHg (12/10 0700) FiO2 (%):  [40 %-50 %] 40 % (12/09 1730) Weight:  [189 lb 9.5 oz (86 kg)-197 lb 1.5 oz (89.4 kg)] 197 lb 1.5 oz (89.4 kg) (12/10 0600)  Hemodynamic parameters for last 24 hours: PAP: (27-41)/(11-18) 30/12 mmHg CO:  [3.4 L/min-6.6 L/min] 6.3 L/min CI:  [1.7 L/min/m2-3.3 L/min/m2] 3.1 L/min/m2  Intake/Output from previous day: 12/09 0701 - 12/10 0700 In: 5507.1 [P.O.:840; I.V.:2732.1; Blood:685; IV Piggyback:1250] Out: 6740 [Urine:4940; Blood:1400; Chest Tube:400]  General appearance: alert, cooperative and no distress Heart: regular rate and rhythm Lungs: clear to auscultation bilaterally Abdomen: soft, non-tender; bowel sounds normal; no masses,  no organomegaly Extremities: edema trace Wound: clean and dry  Lab Results:  Basename 06/30/12 0415 06/29/12 1832 06/29/12 1800  WBC 17.0* -- 17.0*  HGB 12.1* 12.2* --  HCT 35.1* 36.0* --  PLT 166 -- 180   BMET:  Basename 06/30/12 0415 06/29/12 1832 06/28/12 0635  NA 134* 139 --  K 3.9 4.0 --  CL 102 106 --  CO2 22 -- 25  GLUCOSE 115* 131* --  BUN 8 7 --  CREATININE 0.78 0.80 --  CALCIUM 7.8* -- 8.8    PT/INR:  Basename 06/29/12 1240  LABPROT 15.5*  INR 1.25   ABG    Component Value Date/Time   PHART 7.318* 06/29/2012 1846   HCO3 21.7 06/29/2012 1846   TCO2 23 06/29/2012 1846   ACIDBASEDEF 4.0* 06/29/2012 1846   O2SAT 98.0 06/29/2012 1846   CBG (last 3)    Basename 06/30/12 0420 06/30/12 0001 06/29/12 1449  GLUCAP 115* 98 84    Assessment/Plan: S/P Procedure(s) (LRB): CORONARY ARTERY BYPASS GRAFTING (CABG) (N/A)  1. CV- NSR, weaned off all cardiac drips. On Lopressor will titrate as needed 2. Pulm-will wean off oxygen as tolerated, continue IS, CXR with some bilateral atelectasis 3. Acute post operative anemia stable H/H 12.1/35.1 4. Volume Overload- weight is up, diurese as needed 5. CBGs controlled 6. Chest tube- 450cc output since surgery, likely remove today or tomorrow   LOS: 5 days    BARRETT, Terry Hansen 06/30/2012   Patient seen and examined. Agree with above D/c CT Will increase metoprolol as BP elevated Diurese ECG changes resolved, good cardiac index- d/c swan Transfer to PTCU if bed available

## 2012-06-30 NOTE — Progress Notes (Signed)
TCTS  Patient examined and record reviewed.Hemodynamics stable,labs satisfactory.Patient had stable day.Continue current care. VAN TRIGT III,Verdean Murin 06/30/2012      

## 2012-07-01 ENCOUNTER — Inpatient Hospital Stay (HOSPITAL_COMMUNITY): Payer: No Typology Code available for payment source

## 2012-07-01 LAB — GLUCOSE, CAPILLARY
Glucose-Capillary: 136 mg/dL — ABNORMAL HIGH (ref 70–99)
Glucose-Capillary: 101 mg/dL — ABNORMAL HIGH (ref 70–99)
Glucose-Capillary: 112 mg/dL — ABNORMAL HIGH (ref 70–99)

## 2012-07-01 LAB — BASIC METABOLIC PANEL
BUN: 12 mg/dL (ref 6–23)
GFR calc Af Amer: >90 mL/min (ref >90)
GFR calc non Af Amer: >90 mL/min (ref >90)
Potassium: 3.8 mEq/L (ref 3.5–5.1)
Sodium: 130 mEq/L — ABNORMAL LOW (ref 135–145)

## 2012-07-01 LAB — CBC
Hemoglobin: 11.5 g/dL — ABNORMAL LOW (ref 13.0–17.0)
MCHC: 34.6 g/dL (ref 30.0–36.0)
Platelets: 171 10*3/uL (ref 150–400)

## 2012-07-01 MED ORDER — POTASSIUM CHLORIDE 10 MEQ/100ML IV SOLN
10.0000 meq | INTRAVENOUS | Status: DC
  Filled 2012-07-01 (×4): qty 100

## 2012-07-01 MED ORDER — INSULIN ASPART 100 UNIT/ML ~~LOC~~ SOLN: 0.0000 [IU] | Freq: Three times a day (TID) | SUBCUTANEOUS | Status: DC

## 2012-07-01 MED ORDER — METOPROLOL TARTRATE 25 MG PO TABS
37.5000 mg | ORAL_TABLET | Freq: Two times a day (BID) | ORAL | Status: DC
  Administered 2012-07-01 (×2): 37.5 mg via ORAL
  Filled 2012-07-01 (×4): qty 1

## 2012-07-01 MED ORDER — INSULIN ASPART 100 UNIT/ML ~~LOC~~ SOLN: 0.0000 [IU] | SUBCUTANEOUS | Status: DC

## 2012-07-01 MED ORDER — FUROSEMIDE 10 MG/ML IJ SOLN
20.0000 mg | Freq: Once | INTRAMUSCULAR | Status: AC
  Administered 2012-07-01: 20 mg via INTRAVENOUS
  Filled 2012-07-01: qty 2

## 2012-07-01 MED ORDER — POTASSIUM CHLORIDE CRYS ER 20 MEQ PO TBCR
40.0000 meq | EXTENDED_RELEASE_TABLET | Freq: Two times a day (BID) | ORAL | Status: DC
  Administered 2012-07-01 (×2): 40 meq via ORAL
  Filled 2012-07-01 (×4): qty 2

## 2012-07-01 MED ORDER — METOPROLOL TARTRATE 25 MG/10 ML ORAL SUSPENSION
37.5000 mg | Freq: Two times a day (BID) | ORAL | Status: DC
  Filled 2012-07-01 (×2): qty 15

## 2012-07-01 MED ORDER — POTASSIUM CHLORIDE 10 MEQ/50ML IV SOLN
10.0000 meq | INTRAVENOUS | Status: DC
  Filled 2012-07-01 (×4): qty 50

## 2012-07-01 MED ORDER — KETOROLAC TROMETHAMINE 30 MG/ML IJ SOLN
30.0000 mg | Freq: Four times a day (QID) | INTRAMUSCULAR | Status: AC
  Administered 2012-07-01 – 2012-07-02 (×4): 30 mg via INTRAVENOUS
  Filled 2012-07-01 (×4): qty 1

## 2012-07-01 MED FILL — Magnesium Sulfate Inj 50%: INTRAMUSCULAR | Qty: 10 | Status: AC

## 2012-07-01 MED FILL — Potassium Chloride Inj 2 mEq/ML: INTRAVENOUS | Qty: 40 | Status: AC

## 2012-07-01 MED FILL — Sodium Chloride IV Soln 0.9%: INTRAVENOUS | Qty: 1000 | Status: AC

## 2012-07-01 MED FILL — Lidocaine HCl IV Inj 20 MG/ML: INTRAVENOUS | Qty: 5 | Status: AC

## 2012-07-01 MED FILL — Electrolyte-R (PH 7.4) Solution: INTRAVENOUS | Qty: 4000 | Status: AC

## 2012-07-01 MED FILL — Sodium Chloride Irrigation Soln 0.9%: Qty: 3000 | Status: AC

## 2012-07-01 MED FILL — Heparin Sodium (Porcine) Inj 1000 Unit/ML: INTRAMUSCULAR | Qty: 10 | Status: AC

## 2012-07-01 MED FILL — Sodium Bicarbonate IV Soln 8.4%: INTRAVENOUS | Qty: 50 | Status: AC

## 2012-07-01 MED FILL — Mannitol IV Soln 20%: INTRAVENOUS | Qty: 500 | Status: AC

## 2012-07-01 MED FILL — Heparin Sodium (Porcine) Inj 1000 Unit/ML: INTRAMUSCULAR | Qty: 30 | Status: AC

## 2012-07-01 NOTE — Care Management Note (Unsigned)
    Page 1 of 1   07/01/2012     3:34:33 PM   CARE MANAGEMENT NOTE 07/01/2012  Patient:  Terry Hansen, Terry Hansen   Account Number:  [id number]  Date Initiated:  07/01/2012  Documentation initiated by:  Erinne Gillentine  Subjective/Objective Assessment:   PT S/P CABG X 2 ON 06/29/12. PTA, PT INDEPENDENT, LIVES ALONE.     Action/Plan:   MET WITH PT TO DISCUSS DC PLANS.  PT'S BROTHER COMING FROM [place] TO STAY WITH HIM UPON DC.  WILL FOLLOW FOR HOME NEEDS AS PT PROGRESSES.   Anticipated DC Date:  07/04/2012   Anticipated DC Plan:  HOME/SELF CARE      DC Planning Services  CM consult      Choice offered to / List presented to:             Status of service:  In process, will continue to follow Medicare Important Message given?   (If response is "NO", the following Medicare IM given date fields will be blank) Date Medicare IM given:   Date Additional Medicare IM given:    Discharge Disposition:  HOME/SELF CARE  Per UR Regulation:  Reviewed for med. necessity/level of care/duration of stay  If discussed at Long Length of Stay Meetings, dates discussed:    Comments:

## 2012-07-01 NOTE — Progress Notes (Addendum)
[address]            [place]erry [place] [address]          [phone]     2 Days Post-Op  Procedure(s) (LRB): CORONARY ARTERY BYPASS GRAFTING (CABG) (N/A) Subjective: Feels fair, weak and tired. "may have overdone it a little yesterday"  Objective  Telemetry SR/STach, occ PVC  Temp:  [97.8 F (36.6 C)-99.1 F (37.3 C)] 99 F (37.2 C) (12/11 0414) Pulse Rate:  [84-115] 98  (12/11 0414) Resp:  [18-24] 21  (12/11 0414) BP: (96-129)/(59-77) 99/66 mmHg (12/11 0414) SpO2:  [88 %-99 %] 99 % (12/11 0500) Arterial Line BP: (128-167)/(57-70) 167/70 mmHg (12/10 0930) Weight:  [197 lb 6.4 oz (89.54 kg)] 197 lb 6.4 oz (89.54 kg) (12/11 0500)   Intake/Output Summary (Last 24 hours) at 07/01/12 0744 Last data filed at 06/30/12 2000  Gross per 24 hour  Intake    952 ml  Output   1050 ml  Net    -98 ml       General appearance: alert, cooperative and no distress Heart: regular rate and rhythm Lungs: dim in bases Abdomen: soft, nontender, + BS Extremities: trace edema Wound: incision healing well  Lab Results:  Basename 07/01/12 0420 06/30/12 1656 06/30/12 0415  NA 130* -- 134*  K 3.8 -- 3.9  CL 95* -- 102  CO2 28 -- 22  GLUCOSE 129* -- 115*  BUN 12 -- 8  CREATININE 0.92 1.01 --  CALCIUM 8.1* -- 7.8*  MG -- 2.1 2.2  PHOS -- -- --   No results found for this basename: AST:2,ALT:2,ALKPHOS:2,BILITOT:2,PROT:2,ALBUMIN:2 in the last 72 hours No results found for this basename: LIPASE:2,AMYLASE:2 in the last 72 hours  Basename 07/01/12 0420 06/30/12 1656  WBC 15.2* 15.4*  NEUTROABS -- --  HGB 11.5* 12.6*  HCT 33.2* 36.5*  MCV 84.1 84.7  PLT 171 162   No results found for this basename: CKTOTAL:4,CKMB:4,TROPONINI:4 in the last 72 hours No components found with this basename: POCBNP:3 No results found for this basename: DDIMER in the last 72 hours No results found for this basename: HGBA1C in the last 72 hours No results found for this  basename: CHOL,HDL,LDLCALC,TRIG,CHOLHDL in the last 72 hours No results found for this basename: TSH,T4TOTAL,FREET3,T3FREE,THYROIDAB in the last 72 hours No results found for this basename: VITAMINB12,FOLATE,FERRITIN,TIBC,IRON,RETICCTPCT in the last 72 hours  Medications: Scheduled    . acetaminophen  1,000 mg Oral Q6H   Or  . acetaminophen (TYLENOL) oral liquid 160 mg/5 mL  975 mg Per Tube Q6H  . aspirin EC  325 mg Oral Daily   Or  . aspirin  324 mg Per Tube Daily  . atorvastatin  40 mg Oral q1800  . bisacodyl  10 mg Oral Daily   Or  . bisacodyl  10 mg Rectal Daily  . docusate sodium  200 mg Oral Daily  . enoxaparin (LOVENOX) injection  40 mg Subcutaneous Q24H  . [COMPLETED] furosemide  20 mg Intravenous Once  . insulin aspart  0-24 Units Subcutaneous Q4H  . metoprolol tartrate  25 mg Oral BID   Or  . metoprolol tartrate  25 mg Per Tube BID  . [COMPLETED] moving right along book   Does not apply Once  . pantoprazole  40 mg Oral Daily  . [COMPLETED] potassium chloride  10 mEq Intravenous Q1 Hr x 4  . sodium  chloride  3 mL Intravenous Q12H  . [DISCONTINUED] cefUROXime (ZINACEF)  IV  1.5 g Intravenous Q12H  . [DISCONTINUED] famotidine (PEPCID) IV  20 mg Intravenous Q12H  . [DISCONTINUED] insulin aspart  0-24 Units Subcutaneous Q4H  . [DISCONTINUED] insulin regular  0-10 Units Intravenous TID WC  . [DISCONTINUED] metoprolol tartrate  12.5 mg Per Tube BID  . [DISCONTINUED] metoprolol tartrate  12.5 mg Oral BID  . [DISCONTINUED] sodium chloride  3 mL Intravenous Q12H     Radiology/Studies:  Dg Chest Portable 1 View In Am  06/30/2012  *RADIOLOGY REPORT*  Clinical Data: Status post CABG surgery  PORTABLE CHEST - 1 VIEW  Comparison: 06/29/2012  Findings: The endotracheal tube and nasogastric tube have been removed.  The right internal jugular Swan-Ganz catheter has its tip in the region of the interlobar right pulmonary artery.  The mediastinal tube in the left inferior hemithorax  chest tube is stable.  There is persistent opacity at the left lung base consistent with atelectasis.  The lungs are otherwise clear.  The cardiac silhouette is normal in size.  No mediastinal widening.  No pneumothorax.  IMPRESSION: Persistent left basilar atelectasis.   No edema.  Lungs are otherwise clear.  No current evidence of a pneumothorax.  Remaining support apparatus stable well-positioned.   Original Report Authenticated By: Amie Portland, M.D.    Dg Chest Portable 1 View  06/29/2012  *RADIOLOGY REPORT*  Clinical Data: Postop CABG.  PORTABLE CHEST - 1 VIEW  Comparison: 06/25/2012.  Findings: Endotracheal tube tip now 4.3 cm above the carina.  Right-sided Swan-Ganz catheter tip right lower lobe pulmonary artery level.  Nasogastric tube tip gastric fundus with side hole just beyond the gastroesophageal junction.  Mediastinal drain left-sided chest tube in place.  Curvilinear structures right lung apex may represent reflection of ribs rather than pneumothorax.  Attention to this on follow-up.  Basilar subsegmental atelectatic changes with elevated hemidiaphragms and small left-sided pleural effusion.  Surgical clips post CABG.  Heart size top normal.  Mild pulmonary vascular prominence.  IMPRESSION: Post CABG with right Swan-Ganz catheter tip projecting at the level of the right lower lobe pulmonary artery.  Pleural reflections right lung apex suspected.  Tiny pneumothorax not entirely excluded.  Attention to this on follow-up.  Small left-sided pleural effusion with bibasilar subsegmental atelectasis.  This is a call report.   Original Report Authenticated By: Lacy Duverney, M.D.     INR: Will add last result for INR, ABG once components are confirmed Will add last 4 CBG results once components are confirmed Active Problems:  Hyposmolality and/or hyponatremia  Migraine  Mixed hyperlipidemia  CAD (coronary artery disease)  Assessment/Plan: S/P Procedure(s) (LRB): CORONARY ARTERY BYPASS  GRAFTING (CABG) (N/A)  1. Doing very well 2 increase beta blocker dose 3 sugars controlled 4 monitor sodium 5 monitor H/H 6 pulm toilet/rehab 7 diurese some     LOS: 6 days    GOLD,Terry Hansen 12/11/20137:44 AM    Overall doing extremely well. He is having more pain and requesting more toradol- will continue another 24 hours

## 2012-07-01 NOTE — Progress Notes (Signed)
CARDIAC REHAB PHASE I   PRE:  Rate/Rhythm: 110ST  BP:  Supine: 94/70  Sitting:   Standing:    SaO2: 93%RA  MODE:  Ambulation: 550 ft   POST:  Rate/Rhythem: 110ST  BP:  Supine: 120/70  Sitting:   Standing:    SaO2: 94%RA 1005-1040 Pt walked 550 ft on RA with rolling walker and minimal asst. To bathroom prior to walk. Pt states feeling poorly today. Stated he thinks he overdid it yesterday. Tolerated well. Back to bed after walk. Reviewed sternal precautions.  Duanne Limerick

## 2012-07-01 NOTE — Progress Notes (Signed)
Notified by nurse tech that Pt's SpO2 is 88 during AM vital. Pt denied SOB or respiratory distress. Put Pt on 2L O2 via [place]. Rechecked in one hour showed 99. Will continue to monitor.

## 2012-07-02 LAB — GLUCOSE, CAPILLARY
Glucose-Capillary: 106 mg/dL — ABNORMAL HIGH (ref 70–99)
Glucose-Capillary: 114 mg/dL — ABNORMAL HIGH (ref 70–99)
Glucose-Capillary: 147 mg/dL — ABNORMAL HIGH (ref 70–99)

## 2012-07-02 MED ORDER — METOPROLOL TARTRATE 50 MG PO TABS: 50.0000 mg | ORAL_TABLET | Freq: Two times a day (BID) | ORAL | Status: DC

## 2012-07-02 MED ORDER — ASPIRIN 325 MG PO TBEC: 325.0000 mg | DELAYED_RELEASE_TABLET | Freq: Every day | ORAL | Status: DC

## 2012-07-02 MED ORDER — POTASSIUM CHLORIDE CRYS ER 10 MEQ PO TBCR
10.0000 meq | EXTENDED_RELEASE_TABLET | Freq: Once | ORAL | Status: AC
  Administered 2012-07-02: 10 meq via ORAL
  Filled 2012-07-02: qty 1

## 2012-07-02 MED ORDER — ATORVASTATIN CALCIUM 40 MG PO TABS: 40.0000 mg | ORAL_TABLET | Freq: Every day | ORAL | Status: DC

## 2012-07-02 MED ORDER — FUROSEMIDE 10 MG/ML IJ SOLN
20.0000 mg | Freq: Once | INTRAMUSCULAR | Status: AC
  Administered 2012-07-02: 20 mg via INTRAVENOUS
  Filled 2012-07-02: qty 2

## 2012-07-02 MED ORDER — METOPROLOL TARTRATE 50 MG PO TABS
50.0000 mg | ORAL_TABLET | Freq: Two times a day (BID) | ORAL | Status: DC
  Administered 2012-07-02 – 2012-07-03 (×3): 50 mg via ORAL
  Filled 2012-07-02 (×4): qty 1

## 2012-07-02 MED ORDER — FUROSEMIDE 10 MG/ML IJ SOLN
INTRAMUSCULAR | Status: AC
  Administered 2012-07-02: 20 mg
  Filled 2012-07-02: qty 4

## 2012-07-02 MED ORDER — OXYCODONE HCL 5 MG PO TABS: 5.0000 mg | ORAL_TABLET | ORAL | Status: DC | PRN

## 2012-07-02 NOTE — Discharge Summary (Signed)
[address]            [place] [address]          [phone]      Terry Hansen 06/23/1956 56 y.o. [id number]  06/25/2012   Terry Slot, MD  Chest pain [786.50] chest pain; weak cad  HPI: At time of consultation 56 yo WM who presents with a cc/o CP.  [name] is a 56 y/o M with history of HTN and a strong family history of CAD (brother had CABG age 59 with later redo CABG). He has been feeling poorly since having viral illness in September. He has had intermittent dry cough and nasal drainage. He was recently given a short course of prednisone and was started on Zyrtec and chlorthalidone.  Yesterday after work he began to experience scotoma. It resolved but then recurred and was followed by a severe headache. He felt weak, shaky, and dizzy. He took Advil and ate dinner with some relief. This morning around 4:45am, he was awakened from sleep with substernal chest pain/tightness and feeling of being generally weak. + nausea, no vomiting, SOB or diaphoresis. The pain did not radiate. The episode lasted 1.5 hours and he came to the ED. He was fund to be hyponatremic (123) and hypokalemic (2.8). Cardiac enzymes were negative x 2. An EKG showed IRBBB with nonspecific ST-T changes. Cardiac CT showed age advanced obstructive coronary artery disease with mixed plaque in the proximal LAD that results in greater than 75% luminal stenosis, along with other areas of extensive mixed plaque. It also showed aortic valvular calcifications.  He underwent cardiac catheterization which showed severe 2 vessel CAD with a totally occluded nondominant RCA, and severe stenosis in the proximal LAD, involving the takeoff of a large D1. The dominant circumflex had multiple non flow-limiting plaques. LAD and LCX arose from separate ostia.   Past Medical History   Diagnosis  Date   .  Hypertension    .  Ocular migraine     Past Surgical History   Procedure  Date   .   Appendectomy    .  Nasal septum surgery      For uncontrolled epistaxis early 2013    Family History   Problem  Relation  Age of Onset   .  Heart disease  Brother       CABG age 52, MI ~22, redo bypass ~50    .  Heart disease  Paternal Grandmother    .  Heart disease  Cousin       Maternal side    Social History: reports that he has never smoked. He does not have any smokeless tobacco history on file. He reports that he drinks alcohol. He reports that he does not use illicit drugs.  Allergies: No Known Allergies  Medications:  Prior to Admission:  Prescriptions prior to admission   Medication  Sig  Dispense  Refill   .  cetirizine (ZYRTEC) 10 MG tablet  Take 10 mg by mouth daily.     .  chlorthalidone (HYGROTON) 25 MG tablet  Take 25 mg by mouth daily.     .  Coenzyme Q10 200 MG capsule  Take 200 mg by mouth daily. disontinue while in hospital     .  ibuprofen (ADVIL,MOTRIN) 200 MG tablet  Take 600 mg by mouth every 6 (six) hours as needed. migraine headache     .  predniSONE (DELTASONE) 10 MG tablet  Take 20 mg by mouth daily. Patient course completed 06-24-2012     .  aspirin 81 MG tablet  Take 81 mg by mouth daily.     .  diphenhydrAMINE (BENADRYL) 50 MG capsule  Take 50 mg by mouth at bedtime. For sleep          Hospital Course:  The patient was admitted and taken for cardiac catheterization as described above. Due to these findings cardiothoracic surgical consultation was obtained with Terry Hansen M.D. Dr. Dorris Hansen evaluated the patient and studies and agreed with recommendations to proceed with surgical revascularization and on 06/29/2012 he was taken to the operating room at which time he underwent the following procedure:  OPERATIVE REPORT  PREOPERATIVE DIAGNOSIS: Severe two-vessel coronary artery disease with  unstable angina.  POSTOPERATIVE DIAGNOSIS: Severe two-vessel coronary artery disease with  unstable angina.  PROCEDURE: Median sternotomy,  extracorporeal circulation, coronary  artery bypass grafting x2 (sequential left internal mammary artery to  the first diagonal and LAD).  SURGEON: Terry Hansen. Terry Hansen, M.D.  ASSISTANT: Terry Hansen, P.A.-C.  ANESTHESIA: General.  FINDINGS: Target vessels relatively small, but did accept 1.5-mm probe  and more good quality, mammary good quality. No graftable targets in  the distribution of the totally-occluded nondominant right coronary  artery. At the end of the  procedure, the patient was taken from the operating room to the surgical  intensive care unit in good condition.  Post operative hospital course:  Overall the patient has progressed quite nicely. He was weaned from the ventilator without difficulty. All routine lines, monitors and drainage devices have been discontinued in the standard fashion. He does have an acute blood loss anemia which has stabilized. He had some postoperative volume overload which has responded well to diuresis. His rhythm has been stable with some sinus tachycardia with and beta blocker has been initiated. Incision is healing well without evidence of infection. He is tolerating gradually increasing activities using standard protocols. Oxygen has been weaned and he maintains good saturations on room air. His overall status is felt to be tentatively stable for discharge in the morning pending ongoing reevaluation of his recovery       [facility] 07/01/12 0420 06/30/12 0415  NA 130* 134*  K 3.8 3.9  CL 95* 102  CO2 28 22  GLUCOSE 129* 115*  BUN 12 8  CALCIUM 8.1* 7.8*    Basename 07/01/12 0420 06/30/12 1656  WBC 15.2* 15.4*  HGB 11.5* 12.6*  HCT 33.2* 36.5*  PLT 171 162    Basename 06/29/12 1240  INR 1.25     Discharge Instructions:  The patient is discharged to home with extensive instructions on wound care and progressive ambulation.  They are instructed not to drive or perform any heavy lifting until returning to see the physician in his  office.  Discharge Diagnosis:  Chest pain [786.50] chest pain; weak cad  Secondary Diagnosis: Patient Active Problem List  Diagnosis  . Hyposmolality and/or hyponatremia  . Migraine  . Mixed hyperlipidemia  . CAD (coronary artery disease)   Past Medical History  Diagnosis Date  . Hypertension   . Ocular migraine       Medication List     As of 07/03/2012  7:46 AM    STOP taking these medications         aspirin 81 MG tablet      chlorthalidone 25 MG tablet   Commonly known as: HYGROTON  predniSONE 10 MG tablet   Commonly known as: DELTASONE      TAKE these medications         aspirin 325 MG EC tablet   Take 1 tablet (325 mg total) by mouth daily.      atorvastatin 40 MG tablet   Commonly known as: LIPITOR   Take 1 tablet (40 mg total) by mouth daily at 6 PM.      cetirizine 10 MG tablet   Commonly known as: ZYRTEC   Take 10 mg by mouth daily.      Coenzyme Q10 200 MG capsule   Take 200 mg by mouth daily. disontinue while in hospital      diphenhydrAMINE 50 MG capsule   Commonly known as: BENADRYL   Take 50 mg by mouth at bedtime. For sleep      ibuprofen 200 MG tablet   Commonly known as: ADVIL,MOTRIN   Take 600 mg by mouth every 6 (six) hours as needed. migraine headache      lisinopril 10 MG tablet   Commonly known as: PRINIVIL,ZESTRIL   Take 1 tablet (10 mg total) by mouth daily.      metoprolol 50 MG tablet   Commonly known as: LOPRESSOR   Take 1 tablet (50 mg total) by mouth 2 (two) times daily.      oxyCODONE 5 MG immediate release tablet   Commonly known as: Oxy IR/ROXICODONE   Take 1-2 tablets (5-10 mg total) by mouth every 4 (four) hours as needed.         Follow-up Information    Follow up with Terry Slot, MD. (07/21/2012 at 1 PM to see the surgeon. Please obtain a chest x-ray at [facility] at 12 noon. [facility] is located in the same office complex.)    Contact information:   [address] [place] [address] [phone]       Follow up with Charlton Haws, MD. (2 weeks-please contact the office to arrange this appointment)    Contact information:   [address] [place] [address] [phone]         Notation-the patient is on aspirin, beta blocker,ACEI  and statin.  Disposition: for discharge home  Patient's condition is Good  Gershon Crane, PA-C 07/02/2012  9:08 AM

## 2012-07-02 NOTE — Progress Notes (Signed)
[address]            [place] [address]          [phone]     3 Days Post-Op  Procedure(s) (LRB): CORONARY ARTERY BYPASS GRAFTING (CABG) (N/A) Subjective: Feels well  Objective  Telemetry SR/ST  Temp:  [98 F (36.7 C)-99.6 F (37.6 C)] 98 F (36.7 C) (12/12 0441) Pulse Rate:  [85-97] 93  (12/12 0441) Resp:  [18-20] 20  (12/12 0441) BP: (96-120)/(69-81) 96/69 mmHg (12/12 0441) SpO2:  [90 %-93 %] 90 % (12/12 0441) Weight:  [193 lb (87.544 kg)] 193 lb (87.544 kg) (12/12 0441)   Intake/Output Summary (Last 24 hours) at 07/02/12 0734 Last data filed at 07/01/12 1230  Gross per 24 hour  Intake    240 ml  Output    450 ml  Net   -210 ml       General appearance: alert, cooperative and no distress Heart: regular rate and rhythm Lungs: mildly dim in bases Abdomen: benign Extremities: trace edema Wound: incis healing well  Lab Results:  Basename 07/01/12 0420 06/30/12 1656 06/30/12 0415  NA 130* -- 134*  K 3.8 -- 3.9  CL 95* -- 102  CO2 28 -- 22  GLUCOSE 129* -- 115*  BUN 12 -- 8  CREATININE 0.92 1.01 --  CALCIUM 8.1* -- 7.8*  MG -- 2.1 2.2  PHOS -- -- --   No results found for this basename: AST:2,ALT:2,ALKPHOS:2,BILITOT:2,PROT:2,ALBUMIN:2 in the last 72 hours No results found for this basename: LIPASE:2,AMYLASE:2 in the last 72 hours  Basename 07/01/12 0420 06/30/12 1656  WBC 15.2* 15.4*  NEUTROABS -- --  HGB 11.5* 12.6*  HCT 33.2* 36.5*  MCV 84.1 84.7  PLT 171 162   No results found for this basename: CKTOTAL:4,CKMB:4,TROPONINI:4 in the last 72 hours No components found with this basename: POCBNP:3 No results found for this basename: DDIMER in the last 72 hours No results found for this basename: HGBA1C in the last 72 hours No results found for this basename: CHOL,HDL,LDLCALC,TRIG,CHOLHDL in the last 72 hours No results found for this basename: TSH,T4TOTAL,FREET3,T3FREE,THYROIDAB in the last 72 hours No  results found for this basename: VITAMINB12,FOLATE,FERRITIN,TIBC,IRON,RETICCTPCT in the last 72 hours  Medications: Scheduled    . acetaminophen  1,000 mg Oral Q6H   Or  . acetaminophen (TYLENOL) oral liquid 160 mg/5 mL  975 mg Per Tube Q6H  . aspirin EC  325 mg Oral Daily   Or  . aspirin  324 mg Per Tube Daily  . atorvastatin  40 mg Oral q1800  . bisacodyl  10 mg Oral Daily   Or  . bisacodyl  10 mg Rectal Daily  . docusate sodium  200 mg Oral Daily  . enoxaparin (LOVENOX) injection  40 mg Subcutaneous Q24H  . [COMPLETED] furosemide  20 mg Intravenous Once  . [COMPLETED] ketorolac  30 mg Intravenous Q6H  . metoprolol tartrate  37.5 mg Oral BID  . pantoprazole  40 mg Oral Daily  . potassium chloride  40 mEq Oral BID  . sodium chloride  3 mL Intravenous Q12H  . [DISCONTINUED] insulin aspart  0-24 Units Subcutaneous Q4H  . [DISCONTINUED] insulin aspart  0-24 Units Subcutaneous Q4H  . [DISCONTINUED] insulin aspart  0-24 Units Subcutaneous TID WC  . [DISCONTINUED] metoprolol tartrate  25 mg Per Tube BID  . [DISCONTINUED] metoprolol tartrate  37.5 mg  Per Tube BID  . [DISCONTINUED] metoprolol tartrate  25 mg Oral BID  . [DISCONTINUED] potassium chloride  10 mEq Intravenous Q1 Hr x 4  . [DISCONTINUED] potassium chloride  10 mEq Intravenous Q1 Hr x 4     Radiology/Studies:  Dg Chest 2 View  07/01/2012  *RADIOLOGY REPORT*  Clinical Data: Chest pain, status post CABG  CHEST - 2 VIEW  Comparison: 06/30/2012  Findings: Low lung volumes with bibasilar atelectasis.  Interval removal of left chest tube and mediastinal drain.  No pneumothorax.  The heart is normal in size. Postsurgical changes related to prior CABG.  Interval removal of right IJ Swan-Ganz catheter.  Visualized osseous structures are within normal limits.  IMPRESSION: No evidence of acute cardiopulmonary disease.   Original Report Authenticated By: Charline Bills, M.D.     INR: Will add last result for INR, ABG once  components are confirmed Will add last 4 CBG results once components are confirmed  Assessment/Plan: S/P Procedure(s) (LRB): CORONARY ARTERY BYPASS GRAFTING (CABG) (N/A)  1 Doing very well 2 D/C epw's 3 increase lopressor to 50 bid 4 diurese a little further Plan home in am if no new issues    LOS: 7 days    Ixchel Duck E 12/12/20137:34 AM

## 2012-07-02 NOTE — Progress Notes (Signed)
Pt ambulated about 550 ft on room air,tolerated well.Zamere Pasternak RN

## 2012-07-02 NOTE — Progress Notes (Signed)
[id number] Pt has already walked independently with steady gait this am. Stated he did not use walker. Education completed with pt. Encouraged pt to watch discharge video later. Permission given to refer to [facility]. Pt knows to get in 2 or 3 more walks today. Zeven Kocak DunlapRN

## 2012-07-03 LAB — GLUCOSE, CAPILLARY: Glucose-Capillary: 104 mg/dL — ABNORMAL HIGH (ref 70–99)

## 2012-07-03 MED ORDER — LACTULOSE 10 GM/15ML PO SOLN
20.0000 g | Freq: Once | ORAL | Status: AC
  Administered 2012-07-03: 20 g via ORAL
  Filled 2012-07-03: qty 30

## 2012-07-03 MED ORDER — LISINOPRIL 10 MG PO TABS
10.0000 mg | ORAL_TABLET | Freq: Every day | ORAL | Status: DC
  Administered 2012-07-03: 10 mg via ORAL
  Filled 2012-07-03 (×3): qty 1

## 2012-07-03 MED ORDER — LISINOPRIL 10 MG PO TABS: 10.0000 mg | ORAL_TABLET | Freq: Every day | ORAL | Status: DC

## 2012-07-03 NOTE — Progress Notes (Signed)
No BM since surgery; lactulose ordered; awaiting it to arrive from Pharmacy; will administer when available; plan d/c home this afternoon.

## 2012-07-03 NOTE — Progress Notes (Signed)
Chest tube sutures removed per PA order; steri strips applied; will cont. To monitor.

## 2012-07-03 NOTE — Progress Notes (Signed)
Pt up ambulating in hallway independently; steady gait; no needs at this time; will cont. To monitor.

## 2012-07-03 NOTE — Progress Notes (Signed)
D/C instructions given to pt and son at this time; verbalized understanding; pt to d/c home with son.

## 2012-07-03 NOTE — Progress Notes (Signed)
Nutrition Brief Note  Patient identified on the Malnutrition Screening Tool (MST) Report.  Patient's weight has been stable per readings below:  Wt Readings from Last 10 Encounters:  07/03/12 193 lb 2 oz (87.6 kg)  07/03/12 193 lb 2 oz (87.6 kg)  07/03/12 193 lb 2 oz (87.6 kg)  08/19/11 195 lb (88.451 kg)    Body mass index is 27.01 kg/(m^2). Pt meets criteria for Overweight based on current BMI.   Current diet order is Heart Healthy, patient is consuming approximately 75% of meals at this time. Labs and medications reviewed.   No nutrition interventions warranted at this time. Please consult RD as needed.  Kirkland Hun, RD, LDN Pager #: [phone] After-Hours Pager #: [phone]

## 2012-07-03 NOTE — Progress Notes (Signed)
[id number] Cardiac Rehab Pt up walking in hall without c/o . He denies any questions related to education provided yesterday. I did put on Recovery from [facility] video for him to watch.

## 2012-07-03 NOTE — Progress Notes (Addendum)
[address]            [place] [address]          [phone]     4 Days Post-Op  Procedure(s) (LRB): CORONARY ARTERY BYPASS GRAFTING (CABG) (N/A) Subjective: No BM, a little uncomfortable about it   Objective  Telemetry SR/ST   Temp:  [97.8 F (36.6 C)-98.7 F (37.1 C)] 98.7 F (37.1 C) (12/13 0450) Pulse Rate:  [85-95] 89  (12/13 0450) Resp:  [16-18] 17  (12/13 0450) BP: (122-144)/(82-88) 144/88 mmHg (12/13 0450) SpO2:  [95 %-96 %] 95 % (12/13 0450) Weight:  [193 lb 2 oz (87.6 kg)] 193 lb 2 oz (87.6 kg) (12/13 0353)   Intake/Output Summary (Last 24 hours) at 07/03/12 0732 Last data filed at 07/02/12 1300  Gross per 24 hour  Intake    240 ml  Output      0 ml  Net    240 ml       General appearance: alert, cooperative and no distress Heart: regular rate and rhythm Lungs: dim in bases Abdomen: soft, nontender,+ BS Extremities: min edema Wound: healing well  Lab Results:  Basename 07/01/12 0420 06/30/12 1656  NA 130* --  K 3.8 --  CL 95* --  CO2 28 --  GLUCOSE 129* --  BUN 12 --  CREATININE 0.92 1.01  CALCIUM 8.1* --  MG -- 2.1  PHOS -- --   No results found for this basename: AST:2,ALT:2,ALKPHOS:2,BILITOT:2,PROT:2,ALBUMIN:2 in the last 72 hours No results found for this basename: LIPASE:2,AMYLASE:2 in the last 72 hours  Basename 07/01/12 0420 06/30/12 1656  WBC 15.2* 15.4*  NEUTROABS -- --  HGB 11.5* 12.6*  HCT 33.2* 36.5*  MCV 84.1 84.7  PLT 171 162   No results found for this basename: CKTOTAL:4,CKMB:4,TROPONINI:4 in the last 72 hours No components found with this basename: POCBNP:3 No results found for this basename: DDIMER in the last 72 hours No results found for this basename: HGBA1C in the last 72 hours No results found for this basename: CHOL,HDL,LDLCALC,TRIG,CHOLHDL in the last 72 hours No results found for this basename: TSH,T4TOTAL,FREET3,T3FREE,THYROIDAB in the last 72 hours No results found  for this basename: VITAMINB12,FOLATE,FERRITIN,TIBC,IRON,RETICCTPCT in the last 72 hours  Medications: Scheduled    . acetaminophen  1,000 mg Oral Q6H   Or  . acetaminophen (TYLENOL) oral liquid 160 mg/5 mL  975 mg Per Tube Q6H  . aspirin EC  325 mg Oral Daily   Or  . aspirin  324 mg Per Tube Daily  . atorvastatin  40 mg Oral q1800  . bisacodyl  10 mg Oral Daily   Or  . bisacodyl  10 mg Rectal Daily  . docusate sodium  200 mg Oral Daily  . enoxaparin (LOVENOX) injection  40 mg Subcutaneous Q24H  . metoprolol tartrate  50 mg Oral BID  . pantoprazole  40 mg Oral Daily  . sodium chloride  3 mL Intravenous Q12H     Radiology/Studies:  No results found.  INR: Will add last result for INR, ABG once components are confirmed Will add last 4 CBG results once components are confirmed  Assessment/Plan: S/P Procedure(s) (LRB): CORONARY ARTERY BYPASS GRAFTING (CABG) (N/A)  1. Will give some lactulose this am 2 BP up a little so can start low dose ACEI     LOS: 8 days    GOLD,WAYNE E 12/13/20137:32  AM    Patient seen and examined. Agree with above. Home today

## 2012-07-03 NOTE — Progress Notes (Signed)
No BM at this time; +flatus; will cont. To monitor.

## 2012-07-08 ENCOUNTER — Other Ambulatory Visit: Payer: Self-pay

## 2012-07-08 DIAGNOSIS — G8918 Other acute postprocedural pain: Secondary | ICD-10-CM

## 2012-07-08 MED ORDER — HYDROCODONE-ACETAMINOPHEN 5-325 MG PO TABS: 1.0000 | ORAL_TABLET | ORAL | Status: DC | PRN

## 2012-07-08 NOTE — Telephone Encounter (Signed)
Rx for Norco 5/325 mg 1-2 po every 6 hours prn pain called to pt's pharm

## 2012-07-17 ENCOUNTER — Other Ambulatory Visit: Payer: Self-pay

## 2012-07-17 ENCOUNTER — Other Ambulatory Visit: Payer: Self-pay | Admitting: *Deleted

## 2012-07-17 ENCOUNTER — Other Ambulatory Visit: Payer: Self-pay | Admitting: Thoracic Surgery (Cardiothoracic Vascular Surgery)

## 2012-07-17 DIAGNOSIS — G8918 Other acute postprocedural pain: Secondary | ICD-10-CM

## 2012-07-17 DIAGNOSIS — I2510 Atherosclerotic heart disease of native coronary artery without angina pectoris: Secondary | ICD-10-CM

## 2012-07-17 DIAGNOSIS — Z951 Presence of aortocoronary bypass graft: Secondary | ICD-10-CM

## 2012-07-17 MED ORDER — HYDROCODONE-ACETAMINOPHEN 5-325 MG PO TABS: 1.0000 | ORAL_TABLET | ORAL | Status: DC | PRN

## 2012-07-17 NOTE — Telephone Encounter (Signed)
RX Refill called to Pharm. For Norco 5/325 mg #40/no refills

## 2012-07-21 ENCOUNTER — Encounter: Payer: Self-pay | Admitting: Physician Assistant

## 2012-07-21 ENCOUNTER — Ambulatory Visit (INDEPENDENT_AMBULATORY_CARE_PROVIDER_SITE_OTHER): Payer: No Typology Code available for payment source | Admitting: Physician Assistant

## 2012-07-21 ENCOUNTER — Ambulatory Visit
Admission: RE | Admit: 2012-07-21 | Discharge: 2012-07-21 | Disposition: A | Discharge status: Home / Self Care | Payer: No Typology Code available for payment source | Source: Ambulatory Visit | Attending: Cardiothoracic Surgery | Admitting: Cardiothoracic Surgery

## 2012-07-21 ENCOUNTER — Ambulatory Visit (INDEPENDENT_AMBULATORY_CARE_PROVIDER_SITE_OTHER): Payer: Self-pay | Admitting: Thoracic Surgery (Cardiothoracic Vascular Surgery)

## 2012-07-21 ENCOUNTER — Encounter: Payer: Self-pay | Admitting: Thoracic Surgery (Cardiothoracic Vascular Surgery)

## 2012-07-21 VITALS — BP 124/81 | HR 100 | Resp 18 | Ht 71.0 in | Wt 182.0 lb

## 2012-07-21 VITALS — BP 124/90 | HR 90 | Ht 71.0 in | Wt 186.1 lb

## 2012-07-21 DIAGNOSIS — I2510 Atherosclerotic heart disease of native coronary artery without angina pectoris: Secondary | ICD-10-CM

## 2012-07-21 DIAGNOSIS — E785 Hyperlipidemia, unspecified: Secondary | ICD-10-CM

## 2012-07-21 DIAGNOSIS — Z951 Presence of aortocoronary bypass graft: Secondary | ICD-10-CM

## 2012-07-21 DIAGNOSIS — I10 Essential (primary) hypertension: Secondary | ICD-10-CM

## 2012-07-21 MED ORDER — ASPIRIN EC 81 MG PO TBEC: 81.0000 mg | DELAYED_RELEASE_TABLET | Freq: Every day | ORAL | Status: DC

## 2012-07-21 MED ORDER — LISINOPRIL 10 MG PO TABS: 10.0000 mg | ORAL_TABLET | Freq: Every day | ORAL | Status: DC

## 2012-07-21 MED ORDER — METOPROLOL TARTRATE 50 MG PO TABS: 50.0000 mg | ORAL_TABLET | Freq: Two times a day (BID) | ORAL | Status: DC

## 2012-07-21 MED ORDER — NITROGLYCERIN 0.4 MG SL SUBL: 0.4000 mg | SUBLINGUAL_TABLET | SUBLINGUAL | Status: DC | PRN

## 2012-07-21 MED ORDER — ATORVASTATIN CALCIUM 40 MG PO TABS: 40.0000 mg | ORAL_TABLET | Freq: Every day | ORAL | Status: DC

## 2012-07-21 NOTE — Patient Instructions (Signed)
May begin driving on a limited basis in 1 week  Do not lift over 10 pounds for 3 more weeks  May return to work on 08/31/12 with 1/2 days for first 2 weeks

## 2012-07-21 NOTE — Patient Instructions (Addendum)
Keep appointment with Dr. Daleen Squibb 09/25/12  Labs will be done in 6 Weeks (BMET, LFTs, LIPIDs)  DECREASE YOUR ASPRIN TO 81 MG DAILY

## 2012-07-21 NOTE — Progress Notes (Signed)
HPI:  [name] returns for a scheduled postoperative followup visit. He underwent coronary bypass grafting x2 with sequential left internal mammary artery to diagonal and LAD on 06/29/2012. His postoperative course uncomplicated he was discharged home on postoperative day #4. He says that he is been trying to cut back on his pain medication his only using that once or twice a day. He is using it primarily at night before he goes to bed. He does complain of some irritation with his shirt rubs on the left side of his chest. His primary complaint is that refill very good one day and then felt very fatigued and sore the next day. He has not had any anginal-type pain.  Past Medical History  Diagnosis Date  . Hypertension   . Ocular migraine    coronary artery disease    Current Outpatient Prescriptions  Medication Sig Dispense Refill  . aspirin EC 325 MG EC tablet Take 1 tablet (325 mg total) by mouth daily.  30 tablet    . atorvastatin (LIPITOR) 40 MG tablet Take 1 tablet (40 mg total) by mouth daily at 6 PM.  30 tablet  1  . Coenzyme Q10 200 MG capsule Take 200 mg by mouth daily. disontinue while in hospital      . HYDROcodone-acetaminophen (NORCO/VICODIN) 5-325 MG per tablet Take 1 tablet by mouth every 4 (four) hours as needed for pain.  40 tablet  0  . ibuprofen (ADVIL,MOTRIN) 200 MG tablet Take 600 mg by mouth every 6 (six) hours as needed. migraine headache      . lisinopril (PRINIVIL,ZESTRIL) 10 MG tablet Take 1 tablet (10 mg total) by mouth daily.  30 tablet  1  . metoprolol (LOPRESSOR) 50 MG tablet Take 1 tablet (50 mg total) by mouth 2 (two) times daily.  60 tablet  1    Physical Exam BP 124/81  Pulse 100  Resp 18  Ht 5\' 11"  (1.803 m)  Wt 182 lb (82.555 kg)  BMI 25.38 kg/m2  SpO2 98% Well-appearing 56 year old male in no acute distress Lungs clear with equal breath sounds bilaterally Cardiac regular rate and rhythm normal S1 and S2 no rubs or murmurs Sternum stable,  incision clean dry and intact, chest tube sites healing well No peripheral edema  Diagnostic Tests: Chest x-ray 07/21/2012 Comparison: 07/01/2012.  Findings: Minimal basilar atelectatic changes. Interval improved  aeration lung bases.  No pneumothorax or pulmonary edema. Mild central pulmonary  vascular prominence. No segmental consolidation.  Post CABG. No interruption of sternal wires.  IMPRESSION:  Post CABG.  Minimal basilar subsegmental atelectatic changes.   Impression: 56 year old gentleman status post coronary bypass grafting x2 on 06/29/2012. Overall he really is doing well. His complaints are typical for this point in his recovery. He is still having some soreness and has a lack of stamina. I reassured him that both of these issues should improve dramatically over the next 2-3 weeks. He has been trying to cut back on his pain medication, I encouraged that, but also encouraged him to use it when necessary. He is almost out and I gave him a prescription for Norco 5/325 one to 2 tablets up to 3 times daily as needed for pain, 30 tablets no refills.  I advised him to wait at least another week before driving and then discussed appropriate precautions for when he begins that. He's not to lift anything over 10 pounds for 3 weeks.  He had questions regarding returned to work. He is anxious to get back.  I told him that he did start back part-time with half days beginning 08/30/2012.  Plan:  He will see Dr. Vern Claude PA today and then has an appointment with Dr. Daleen Squibb in March.  I will be happy to see him back any time if I can be of any further assistance with his care.

## 2012-07-21 NOTE — Progress Notes (Signed)
[address] [place], [place]  [address] Phone: [phone], Fax:  [phone]  Date:  07/21/2012   Name:  Terry Hansen   DOB:  [birth year]   MRN:  [id number]  PCP:  No primary provider on file.  Primary Cardiologist:  Dr. Valera Castle   Primary Electrophysiologist:  None    History of Present Illness: Terry Hansen is a 56 y.o. male who returns for follow up after recent admission to the hospital for [place] and CABG.  He has a history of HTN and family history of CAD. He was admitted 12/5-12/13. He presented with chest pain. He ruled out for myocardial infarction by enzymes. Cardiac CT demonstrated evidence of advanced CAD. LHC was performed and demonstrated high-grade proximal LAD lesion as well as an occluded RCA. EF was normal by echocardiogram. He was referred for bypass surgery. This was performed by Dr. Dorris Fetch. Grafts included LIMA-LAD and D1. Patient's postoperative course was fairly uneventful. He did have blood loss anemia as well as volume overload. He remained in sinus rhythm.  Since d/c he is doing well. The patient denies chest pain, shortness of breath, syncope, orthopnea, PND or significant pedal edema.  Saw Dr. Dorris Fetch back today and was released.  He has been staying with family in [facility], [place].  Plans to start cardiac rehab when he returns to [place].    Labs (12/13):  K 3.8, creatinine 0.92, Mg 2.1, LDL 88, Hgb 11.5   Wt Readings from Last 3 Encounters:  07/21/12 186 lb 1.9 oz (84.423 kg)  07/21/12 182 lb (82.555 kg)  07/03/12 193 lb 2 oz (87.6 kg)     Past Medical History  Diagnosis Date  . Hypertension   . Ocular migraine   . CAD (coronary artery disease)     a.  LHC 06/26/12:  pLAD 90%, prox/mid/dist CFS 20-30%, oRCA 100% (non dom), EF 55%;  b. Echo 12/13:  EF 55-60%, Gr 1 diast dysfn;  c. s/p CABG Dorris Fetch) 12/13: L-LAD/Dx    . HLD (hyperlipidemia)     Current Outpatient Prescriptions  Medication Sig Dispense Refill  .  aspirin EC 325 MG EC tablet Take 1 tablet (325 mg total) by mouth daily.  30 tablet    . atorvastatin (LIPITOR) 40 MG tablet Take 1 tablet (40 mg total) by mouth daily at 6 PM.  30 tablet  1  . Coenzyme Q10 200 MG capsule Take 200 mg by mouth daily. disontinue while in hospital      . HYDROcodone-acetaminophen (NORCO/VICODIN) 5-325 MG per tablet Take 1 tablet by mouth every 4 (four) hours as needed for pain.  40 tablet  0  . ibuprofen (ADVIL,MOTRIN) 200 MG tablet Take 600 mg by mouth every 6 (six) hours as needed. migraine headache      . lisinopril (PRINIVIL,ZESTRIL) 10 MG tablet Take 1 tablet (10 mg total) by mouth daily.  30 tablet  1  . metoprolol (LOPRESSOR) 50 MG tablet Take 1 tablet (50 mg total) by mouth 2 (two) times daily.  60 tablet  1    Allergies:   No Known Allergies  Social History:  The patient  reports that he has never smoked. He does not have any smokeless tobacco history on file. He reports that he drinks alcohol. He reports that he does not use illicit drugs.   ROS:  Please see the history of present illness.   Notes easy bruising.   All other systems reviewed and negative.   PHYSICAL EXAM:  VS:  BP 124/90  Pulse 90  Ht 5\' 11"  (1.803 m)  Wt 186 lb 1.9 oz (84.423 kg)  BMI 25.96 kg/m2 Well nourished, well developed, in no acute distress HEENT: normal Neck: no JVD Cardiac:  normal S1, S2; RRR; no murmur Lungs:  clear to auscultation bilaterally, no wheezing, rhonchi or rales Abd: soft, nontender, no hepatomegaly Ext: no edema; right wrist without hematoma or bruit  Skin: warm and dry Neuro:  CNs 2-12 intact, no focal abnormalities noted  EKG:  NSR, HR 91, normal axis, inc RBBB, NSSTTW changes     ASSESSMENT AND PLAN:  1. Coronary Artery Disease:   Doing well post CABG.  He plans to start cardiac rehab.  Continue ASA, statin, beta blocker and ACE.  He can reduce ASA to 81 mg QD.  2. Hypertension:  Controlled.  Continue current therapy.  3. Hyperlipidemia:   Continue statin.  Check Lipids and LFTs in 6 weeks.    4. Disposition:  Follow up with Dr. Valera Castle in 09/2012 as planned.  Signed, Tereso Newcomer, PA-C  3:55 PM 07/21/2012

## 2012-08-06 ENCOUNTER — Encounter (HOSPITAL_COMMUNITY)
Admission: RE | Admit: 2012-08-06 | Discharge: 2012-08-06 | Disposition: A | Discharge status: Home / Self Care | Payer: No Typology Code available for payment source | Source: Ambulatory Visit | Attending: Cardiology | Admitting: Cardiology

## 2012-08-06 DIAGNOSIS — I2510 Atherosclerotic heart disease of native coronary artery without angina pectoris: Secondary | ICD-10-CM | POA: Insufficient documentation

## 2012-08-06 DIAGNOSIS — E782 Mixed hyperlipidemia: Secondary | ICD-10-CM | POA: Insufficient documentation

## 2012-08-06 DIAGNOSIS — I10 Essential (primary) hypertension: Secondary | ICD-10-CM | POA: Insufficient documentation

## 2012-08-06 DIAGNOSIS — I200 Unstable angina: Secondary | ICD-10-CM | POA: Insufficient documentation

## 2012-08-06 DIAGNOSIS — Z5189 Encounter for other specified aftercare: Secondary | ICD-10-CM | POA: Insufficient documentation

## 2012-08-06 NOTE — Progress Notes (Signed)
Cardiac Rehab Medication Review by a Pharmacist  Does the patient  feel that his/her medications are working for him/her?  yes  Has the patient been experiencing any side effects to the medications prescribed?  no  Does the patient measure his/her own blood pressure or blood glucose at home?  yes   Does the patient have any problems obtaining medications due to transportation or finances?   no  Understanding of regimen: excellent Understanding of indications: good Potential of compliance: excellent  Pharmacist comments: Patient's medication list was reviewed for accuracy, indications, adverse effects, and compliance. Any questions were addressed at this time  Abran Duke, PharmD Clinical Pharmacist Phone: [phone] Pager: [phone] 08/06/2012 8:27 AM

## 2012-08-10 ENCOUNTER — Encounter (HOSPITAL_COMMUNITY)
Admission: RE | Admit: 2012-08-10 | Discharge: 2012-08-10 | Disposition: A | Discharge status: Home / Self Care | Payer: No Typology Code available for payment source | Source: Ambulatory Visit | Attending: Cardiology | Admitting: Cardiology

## 2012-08-10 ENCOUNTER — Encounter (HOSPITAL_COMMUNITY): Payer: Self-pay

## 2012-08-10 NOTE — Progress Notes (Signed)
Pt started cardiac rehab today.  Pt tolerated light exercise without difficulty. Telemetry rhythm Sinus without ectopy.  Vital signs stable, Will continue to monitor the patient throughout  the program.

## 2012-08-12 ENCOUNTER — Encounter (HOSPITAL_BASED_OUTPATIENT_CLINIC_OR_DEPARTMENT_OTHER): Payer: Self-pay | Admitting: Family Medicine

## 2012-08-12 ENCOUNTER — Telehealth: Payer: Self-pay | Admitting: Family Medicine

## 2012-08-12 ENCOUNTER — Observation Stay (HOSPITAL_BASED_OUTPATIENT_CLINIC_OR_DEPARTMENT_OTHER)
Admission: EM | Admit: 2012-08-12 | Discharge: 2012-08-13 | Disposition: A | Discharge status: Home / Self Care | Payer: No Typology Code available for payment source | Attending: Internal Medicine | Admitting: Internal Medicine

## 2012-08-12 ENCOUNTER — Emergency Department (HOSPITAL_BASED_OUTPATIENT_CLINIC_OR_DEPARTMENT_OTHER): Payer: No Typology Code available for payment source

## 2012-08-12 ENCOUNTER — Encounter (HOSPITAL_COMMUNITY): Payer: No Typology Code available for payment source

## 2012-08-12 ENCOUNTER — Telehealth: Payer: Self-pay | Admitting: Cardiology

## 2012-08-12 DIAGNOSIS — Z7982 Long term (current) use of aspirin: Secondary | ICD-10-CM | POA: Insufficient documentation

## 2012-08-12 DIAGNOSIS — I2510 Atherosclerotic heart disease of native coronary artery without angina pectoris: Secondary | ICD-10-CM | POA: Insufficient documentation

## 2012-08-12 DIAGNOSIS — I6782 Cerebral ischemia: Secondary | ICD-10-CM | POA: Insufficient documentation

## 2012-08-12 DIAGNOSIS — I10 Essential (primary) hypertension: Secondary | ICD-10-CM | POA: Insufficient documentation

## 2012-08-12 DIAGNOSIS — G8918 Other acute postprocedural pain: Secondary | ICD-10-CM

## 2012-08-12 DIAGNOSIS — Z79899 Other long term (current) drug therapy: Secondary | ICD-10-CM | POA: Insufficient documentation

## 2012-08-12 DIAGNOSIS — M6281 Muscle weakness (generalized): Secondary | ICD-10-CM | POA: Insufficient documentation

## 2012-08-12 DIAGNOSIS — R072 Precordial pain: Secondary | ICD-10-CM | POA: Diagnosis present

## 2012-08-12 DIAGNOSIS — E785 Hyperlipidemia, unspecified: Secondary | ICD-10-CM | POA: Insufficient documentation

## 2012-08-12 DIAGNOSIS — R079 Chest pain, unspecified: Secondary | ICD-10-CM | POA: Insufficient documentation

## 2012-08-12 DIAGNOSIS — Z7902 Long term (current) use of antithrombotics/antiplatelets: Secondary | ICD-10-CM | POA: Insufficient documentation

## 2012-08-12 DIAGNOSIS — E871 Hypo-osmolality and hyponatremia: Secondary | ICD-10-CM

## 2012-08-12 DIAGNOSIS — G939 Disorder of brain, unspecified: Secondary | ICD-10-CM | POA: Insufficient documentation

## 2012-08-12 DIAGNOSIS — E782 Mixed hyperlipidemia: Secondary | ICD-10-CM

## 2012-08-12 DIAGNOSIS — G43909 Migraine, unspecified, not intractable, without status migrainosus: Principal | ICD-10-CM | POA: Insufficient documentation

## 2012-08-12 DIAGNOSIS — G459 Transient cerebral ischemic attack, unspecified: Secondary | ICD-10-CM | POA: Insufficient documentation

## 2012-08-12 LAB — COMPREHENSIVE METABOLIC PANEL
ALT: 38 U/L (ref 0–53)
AST: 19 U/L (ref 0–37)
Albumin: 3.9 g/dL (ref 3.5–5.2)
CO2: 28 mEq/L (ref 19–32)
Calcium: 9.5 mg/dL (ref 8.4–10.5)
Chloride: 99 mEq/L (ref 96–112)
Creatinine, Ser: 1.1 mg/dL (ref 0.50–1.35)
Sodium: 138 mEq/L (ref 135–145)

## 2012-08-12 LAB — CBC
MCHC: 35.3 g/dL (ref 30.0–36.0)
Platelets: 257 10*3/uL (ref 150–400)
RDW: 12.6 % (ref 11.5–15.5)
WBC: 9.6 10*3/uL (ref 4.0–10.5)

## 2012-08-12 LAB — URINALYSIS, ROUTINE W REFLEX MICROSCOPIC
Glucose, UA: NEGATIVE mg/dL
Hgb urine dipstick: NEGATIVE
Leukocytes, UA: NEGATIVE
Specific Gravity, Urine: 1.018 (ref 1.005–1.030)
pH: 5.5 (ref 5.0–8.0)

## 2012-08-12 LAB — CBC WITH DIFFERENTIAL/PLATELET
Basophils Absolute: 0 10*3/uL (ref 0.0–0.1)
Basophils Relative: 0 % (ref 0–1)
Eosinophils Relative: 2 % (ref 0–5)
HCT: 45.3 % (ref 39.0–52.0)
Lymphocytes Relative: 26 % (ref 12–46)
MCHC: 34.7 g/dL (ref 30.0–36.0)
Monocytes Absolute: 0.7 10*3/uL (ref 0.1–1.0)
Neutro Abs: 5.9 10*3/uL (ref 1.7–7.7)
Platelets: 307 10*3/uL (ref 150–400)
RDW: 12.7 % (ref 11.5–15.5)
WBC: 9.1 10*3/uL (ref 4.0–10.5)

## 2012-08-12 LAB — CREATININE, SERUM
Creatinine, Ser: 0.96 mg/dL (ref 0.50–1.35)
GFR calc Af Amer: >90 mL/min (ref >90)
GFR calc non Af Amer: 90 mL/min — ABNORMAL LOW (ref >90)

## 2012-08-12 LAB — PROTIME-INR
INR: 0.99 (ref 0.00–1.49)
Prothrombin Time: 13 seconds (ref 11.6–15.2)

## 2012-08-12 MED ORDER — SODIUM CHLORIDE 0.9 % IJ SOLN: 3.0000 mL | INTRAMUSCULAR | Status: DC | PRN

## 2012-08-12 MED ORDER — MORPHINE SULFATE 2 MG/ML IJ SOLN: 2.0000 mg | INTRAMUSCULAR | Status: DC | PRN

## 2012-08-12 MED ORDER — SODIUM CHLORIDE 0.9 % IV SOLN: 250.0000 mL | INTRAVENOUS | Status: DC | PRN

## 2012-08-12 MED ORDER — ZOLPIDEM TARTRATE 5 MG PO TABS: 5.0000 mg | ORAL_TABLET | Freq: Every evening | ORAL | Status: DC | PRN

## 2012-08-12 MED ORDER — DOCUSATE SODIUM 100 MG PO CAPS
100.0000 mg | ORAL_CAPSULE | Freq: Two times a day (BID) | ORAL | Status: DC
  Administered 2012-08-13: 100 mg via ORAL
  Filled 2012-08-12 (×2): qty 1

## 2012-08-12 MED ORDER — ATORVASTATIN CALCIUM 40 MG PO TABS
40.0000 mg | ORAL_TABLET | Freq: Every day | ORAL | Status: DC
  Administered 2012-08-12: 40 mg via ORAL
  Filled 2012-08-12 (×2): qty 1

## 2012-08-12 MED ORDER — ASPIRIN EC 81 MG PO TBEC
81.0000 mg | DELAYED_RELEASE_TABLET | Freq: Every day | ORAL | Status: DC
  Administered 2012-08-13: 81 mg via ORAL
  Filled 2012-08-12: qty 1

## 2012-08-12 MED ORDER — SODIUM CHLORIDE 0.9 % IJ SOLN
3.0000 mL | Freq: Two times a day (BID) | INTRAMUSCULAR | Status: DC
  Administered 2012-08-13: 3 mL via INTRAVENOUS

## 2012-08-12 MED ORDER — SODIUM CHLORIDE 0.9 % IJ SOLN: 3.0000 mL | Freq: Two times a day (BID) | INTRAMUSCULAR | Status: DC

## 2012-08-12 MED ORDER — ONDANSETRON HCL 4 MG/2ML IJ SOLN: 4.0000 mg | Freq: Three times a day (TID) | INTRAMUSCULAR | Status: DC | PRN

## 2012-08-12 MED ORDER — ENOXAPARIN SODIUM 40 MG/0.4ML ~~LOC~~ SOLN
40.0000 mg | SUBCUTANEOUS | Status: DC
  Filled 2012-08-12 (×3): qty 0.4

## 2012-08-12 MED ORDER — LISINOPRIL 10 MG PO TABS
10.0000 mg | ORAL_TABLET | Freq: Every day | ORAL | Status: DC
  Administered 2012-08-13: 10 mg via ORAL
  Filled 2012-08-12 (×2): qty 1

## 2012-08-12 MED ORDER — SODIUM CHLORIDE 0.9 % IV SOLN: INTRAVENOUS | Status: DC

## 2012-08-12 MED ORDER — HYDROCODONE-ACETAMINOPHEN 5-325 MG PO TABS: 1.0000 | ORAL_TABLET | ORAL | Status: DC | PRN

## 2012-08-12 MED ORDER — NITROGLYCERIN 0.4 MG SL SUBL: 0.4000 mg | SUBLINGUAL_TABLET | SUBLINGUAL | Status: DC | PRN

## 2012-08-12 MED ORDER — ONDANSETRON HCL 4 MG PO TABS: 4.0000 mg | ORAL_TABLET | Freq: Four times a day (QID) | ORAL | Status: DC | PRN

## 2012-08-12 MED ORDER — ALUM & MAG HYDROXIDE-SIMETH 200-200-20 MG/5ML PO SUSP: 30.0000 mL | Freq: Four times a day (QID) | ORAL | Status: DC | PRN

## 2012-08-12 MED ORDER — ONDANSETRON HCL 4 MG/2ML IJ SOLN: 4.0000 mg | Freq: Four times a day (QID) | INTRAMUSCULAR | Status: DC | PRN

## 2012-08-12 MED ORDER — METOPROLOL TARTRATE 50 MG PO TABS
50.0000 mg | ORAL_TABLET | Freq: Two times a day (BID) | ORAL | Status: DC
  Administered 2012-08-12 – 2012-08-13 (×2): 50 mg via ORAL
  Filled 2012-08-12 (×4): qty 1

## 2012-08-12 MED ORDER — COENZYME Q10 200 MG PO CAPS: 200.0000 mg | ORAL_CAPSULE | Freq: Every day | ORAL | Status: DC

## 2012-08-12 NOTE — Telephone Encounter (Signed)
Pt woke up lightheaded this morning, but he says that happens sometimes, even before his CABG. Pt states he called cardiac rehab and was told not to come today and call our office. Pt has not eaten yet and he is unable to check his BP right now because he needs to change the battery in his BP monitor. Pt will eat and check his BP. I will call him back to see how he is feeling in about 30 minutes.

## 2012-08-12 NOTE — Progress Notes (Addendum)
Pt just got on the floor from [facility] not too long ago, made comfortable in bed, has no complaints, MD on call has been paged per order, report has been given to on-coming nurse-----Romell Wolden, rn

## 2012-08-12 NOTE — ED Notes (Signed)
Report called to [facility] [address] at [employer] [facility] [employer] [facility] given to [employer] [facility] is leaving now to transport to [facility] for admission

## 2012-08-12 NOTE — ED Notes (Signed)
Patient transported to X-ray 

## 2012-08-12 NOTE — H&P (Signed)
[facility] History and Physical  Terry Hansen BJY:[id number] DOB: 12-10-1955    PCP:   Terry Bellow, MD   Chief Complaint: blurry vision, weakness, and chest tightness.  HPI: Terry Hansen is an 57 y.o. male with hx of ocular migraine, manifests with blurry vx with or without HA, Hx of CAD, s/p CABGx2 only 6 weeks ago, HTN, Hyperlipidemia, presents to [facility] with complaints of dim vision lasting about 2 minutes this morning.  He also felt some chest tightness and generalized weakness.  He denied slurred speech, paresthesia or focal weakness.  He has no SOB, cough, fever, chills, or palpitation.  There has been no nausea, vomiting, black or bloody stool.  He didn't have any diaphoresis.  In the ER, his symptoms resolved spontaneously and hadn't recurred.  Work up in the ER included a head CT which was negative, unremarkable serologies with negative troponin.  His EKG showed no acute ischemic changes.  It should be noted that he had a coratid doppler about 6 weeks ago which was negative except for mild soft plaques as well.  Hospitalist was asked to admit patient for ? TIA and Chest pain r/out.   Rewiew of Systems:  Constitutional: Negative for malaise, fever and chills. No significant weight loss or weight gain Eyes: Negative for eye pain, redness and discharge, diplopia,  or flashes of light. ENMT: Negative for ear pain, hoarseness, nasal congestion, sinus pressure and sore throat. No headaches; tinnitus, drooling, or problem swallowing. Cardiovascular: Negative for palpitations, diaphoresis, dyspnea and peripheral edema. ; No orthopnea, PND Respiratory: Negative for cough, hemoptysis, wheezing and stridor. No pleuritic chestpain. Gastrointestinal: Negative for nausea, vomiting, diarrhea, constipation, abdominal pain, melena, blood in stool, hematemesis, jaundice and rectal bleeding.    Genitourinary: Negative for frequency, dysuria, incontinence,flank pain and  hematuria; Musculoskeletal: Negative for back pain and neck pain. Negative for swelling and trauma.;  Skin: . Negative for pruritus, rash, abrasions, bruising and skin lesion.; ulcerations Neuro: Negative for headache, lightheadedness and neck stiffness. Negative for  altered level of consciousness , altered mental status, extremity weakness, burning feet, involuntary movement, seizure and syncope.  Psych: negative for anxiety, depression, insomnia, tearfulness, panic attacks, hallucinations, paranoia, suicidal or homicidal ideation   Past Medical History  Diagnosis Date  . Hypertension   . Ocular migraine   . CAD (coronary artery disease)     a.  LHC 06/26/12:  pLAD 90%, prox/mid/dist CFS 20-30%, oRCA 100% (non dom), EF 55%;  b. Echo 12/13:  EF 55-60%, Gr 1 diast dysfn;  c. s/p CABG Dorris Fetch) 12/13: L-LAD/Dx    . HLD (hyperlipidemia)     Past Surgical History  Procedure Date  . Appendectomy   . Nasal septum surgery     For uncontrolled epistaxis early 2013  . Coronary artery bypass graft 06/29/2012    Procedure: CORONARY ARTERY BYPASS GRAFTING (CABG);  Surgeon: Loreli Slot, MD;  Location: [facility] OR;  Service: Open Heart Surgery;  Laterality: N/A;  times two using Left Internal Mammary Artery    Medications:  HOME MEDS: Prior to Admission medications   Medication Sig Start Date End Date Taking? Authorizing Provider  aspirin EC 81 MG tablet Take 1 tablet (81 mg total) by mouth daily. 07/21/12  Yes Beatrice Lecher, PA  atorvastatin (LIPITOR) 40 MG tablet Take 1 tablet (40 mg total) by mouth daily at 6 PM. 07/21/12  Yes Beatrice Lecher, PA  Coenzyme Q10 200 MG capsule Take 200 mg by mouth daily. disontinue while in hospital  Yes Historical Provider, MD  ibuprofen (ADVIL,MOTRIN) 200 MG tablet Take 600 mg by mouth every 6 (six) hours as needed. migraine headache   Yes Historical Provider, MD  lisinopril (PRINIVIL,ZESTRIL) 10 MG tablet Take 1 tablet (10 mg total) by mouth daily.  07/21/12  Yes Beatrice Lecher, PA  metoprolol (LOPRESSOR) 50 MG tablet Take 1 tablet (50 mg total) by mouth 2 (two) times daily. 07/21/12  Yes Beatrice Lecher, PA  nitroGLYCERIN (NITROSTAT) 0.4 MG SL tablet Place 1 tablet (0.4 mg total) under the tongue every 5 (five) minutes as needed for chest pain. 07/21/12  Yes Beatrice Lecher, PA  HYDROcodone-acetaminophen (NORCO/VICODIN) 5-325 MG per tablet Take 1 tablet by mouth every 4 (four) hours as needed for pain. 07/17/12   Loreli Slot, MD     Allergies:  No Known Allergies  Social History:   reports that he has never smoked. He does not have any smokeless tobacco history on file. He reports that he drinks alcohol. He reports that he does not use illicit drugs.  Family History: Family History  Problem Relation Age of Onset  . Heart disease Brother     CABG age 38, MI ~48, redo bypass ~50  . Heart disease Paternal Grandmother   . Heart disease Cousin     Maternal side     Physical Exam: Filed Vitals:   08/12/12 1632 08/12/12 1715 08/12/12 1858 08/12/12 2130  BP:  107/71 111/75 123/83  Pulse:  79 80 84  Temp: 98.4 F (36.9 C)  98 F (36.7 C) 97.9 F (36.6 C)  TempSrc:   Oral Oral  Resp:  20 18 20   SpO2:  98% 98% 98%   Blood pressure 123/83, pulse 84, temperature 97.9 F (36.6 C), temperature source Oral, resp. rate 20, SpO2 98.00%.  GEN:  Pleasant  patient lying in the stretcher in no acute distress; cooperative with exam. PSYCH:  alert and oriented x4; does not appear anxious or depressed; affect is appropriate. HEENT: Mucous membranes pink and anicteric; PERRLA; EOM intact; no cervical lymphadenopathy nor thyromegaly or carotid bruit; no JVD; There were no stridor. Neck is very supple. Breasts:: Not examined CHEST WALL: No tenderness. Scar well healed. CHEST: Normal respiration, clear to auscultation bilaterally.  HEART: Regular rate and rhythm.  There are no murmur, rub, or gallops.   BACK: No kyphosis or scoliosis;  no CVA tenderness ABDOMEN: soft and non-tender; no masses, no organomegaly, normal abdominal bowel sounds; no pannus; no intertriginous candida. There is no rebound and no distention. Rectal Exam: Not done EXTREMITIES: No bone or joint deformity; age-appropriate arthropathy of the hands and knees; no edema; no ulcerations.  There is no calf tenderness. Genitalia: not examined PULSES: 2+ and symmetric SKIN: Normal hydration no rash or ulceration CNS: Cranial nerves 2-12 grossly intact no focal lateralizing neurologic deficit.  Speech is fluent; uvula elevated with phonation, facial symmetry and tongue midline. DTR are normal bilaterally, cerebella exam is intact, barbinski is negative and strengths are equaled bilaterally.  No sensory loss.   Labs on Admission:  Basic Metabolic Panel:  Lab 08/12/12 5284  NA 138  K 3.5  CL 99  CO2 28  GLUCOSE 99  BUN 16  CREATININE 1.10  CALCIUM 9.5  MG --  PHOS --   Liver Function Tests:  Lab 08/12/12 1515  AST 19  ALT 38  ALKPHOS 140*  BILITOT 0.4  PROT 7.3  ALBUMIN 3.9   No results found for this basename: LIPASE:5,AMYLASE:5 in  the last 168 hours No results found for this basename: AMMONIA:5 in the last 168 hours CBC:  Lab 08/12/12 1515  WBC 9.1  NEUTROABS 5.9  HGB 15.7  HCT 45.3  MCV 84.5  PLT 307   Cardiac Enzymes:  Lab 08/12/12 1515  CKTOTAL --  CKMB --  CKMBINDEX --  TROPONINI <0.30    CBG: No results found for this basename: GLUCAP:5 in the last 168 hours   Radiological Exams on Admission: Dg Chest 2 View  08/12/2012  *RADIOLOGY REPORT*  Clinical Data: Weakness.  CHEST - 2 VIEW  Comparison: 07/21/2012  Findings: Prior CABG.  Heart is normal size.  Linear densities in the lung bases, stable, scarring versus chronic atelectasis.  No effusions.  No acute bony abnormality.  IMPRESSION: Bibasilar scarring or atelectasis, stable.  No acute findings.   Original Report Authenticated By: Charlett Nose, M.D.    Ct Head Wo  Contrast  08/12/2012  *RADIOLOGY REPORT*  Clinical Data: Weakness.  CT HEAD WITHOUT CONTRAST  Technique:  Contiguous axial images were obtained from the base of the skull through the vertex without contrast.  Comparison: None.  Findings: On No acute intracranial abnormality.  Specifically, no hemorrhage, hydrocephalus, mass lesion, acute infarction, or significant intracranial injury.  No acute calvarial abnormality. Visualized paranasal sinuses and mastoids clear.  Orbital soft tissues unremarkable.  IMPRESSION: No acute intracranial abnormality.   Original Report Authenticated By: Charlett Nose, M.D.    EKG:  SR with no acute ST-T changes.  Assessment/Plan Present on Admission:  . Migraine . CAD (coronary artery disease) . TIA (transient ischemic attack) . Chest pain   PLAN:  I think his blurry vision is his ocular migraine.  I don't know why he had felt weak today.  He has recent coratid doppler, which showed mild soft plaque, but non-critical lesion, so I will not repeat.  I think it is prudent to order a brain MRI and I have done so.  He should continue with his meds including statin and ASA.  With respect to his chest tightness, his EKG was unremarkable, and he will be r/out with serial troponins.  Currently he is pain free, and I doubt if he had an ACS.  He is stable, full code, and will be admitted to OBS under [facility] service.  If his MRI is negative, I think he can be discharged home on current regimen.  Thank you for allowing me to partake in the care of your nice patient.    Other plans as per orders.  Code Status: FULL Terry Lightning, MD. [facility] Pager [phone] 7pm to 7am.  08/12/2012, 9:45 PM

## 2012-08-12 NOTE — ED Notes (Signed)
Pt c/o blurry vision that lasted "a couple of mins" and resolved approx 20 mins ago. Pt also sts he feels just weak all over and had a "tightness in chest" that last a few mins but has since resolved. Pt drove self to ED.

## 2012-08-12 NOTE — Telephone Encounter (Signed)
Pt is light headed, he started cardiac rehab Monday and was told not to come today due to how he feels no other symtoms and he want to talk about

## 2012-08-12 NOTE — Telephone Encounter (Signed)
Spoke with pt. Pt states he did cardiac rehab on Monday with no problems. Pt states BP and heart rate were fine.

## 2012-08-12 NOTE — Telephone Encounter (Signed)
Spoke with pt again. Pt states he is feeling better. He has eaten and his BP was 119/80 and heart rate was 81. Pt denies any dizziness or feeling like he is going to pass out. Pt denies increase in SOB or  chest pain. Pt will monitor symptoms and call if any changes or he wants to come in for appt.

## 2012-08-12 NOTE — ED Provider Notes (Signed)
History  This chart was scribed for Terry Octave, MD by Bennett Scrape, ED Scribe. This patient was seen in room MH01/MH01 and the patient's care was started at 3:09 PM.  CSN: [id number]  Arrival date & time 08/12/12  1453   First MD Initiated Contact with Patient 08/12/12 1509      Chief Complaint  Patient presents with  . Weakness      The history is provided by the patient. No language interpreter was used.   Terry Hansen is a 57 y.o. male who presents to the Emergency Department complaining of sudden onset, constant generalized weakness that started when he woke up this morning. He states that he called his cardiologist and was advised to eat and take his medications which he states he did with no improvement. He reports that the symptoms became suddenly worse while sitting on his couch when he felt his limbs go "limp" and he had 2 minutes of blurred vision, dizziness, nausea and chest tightness that have since resolved. He had a CABG performed on Dec. 9th, 2013 by Dr. Dorris Fetch and states that he was discharged 6 days later. He reports that he started rehab 2 days ago and denies any post surgery complications. He denies having a prior MI. He reports that he a prior possible episode prior to the CABG but is unable to give more details. He denies CP or tightness, dizziness, nausea, and visual disturbances currently and denies lightheadedness, emesis, HA, abdominal pain and numbness as associated symptoms. He has a h/o HTN and takes daily medications for that. He is an occasional alcohol user but denies smoking.    Cardiologist is Dr. Daleen Squibb and pt reports that he has a f/u appointment in March 2014. PCP is Dr. Farris Has with [facility]   Past Medical History  Diagnosis Date  . Hypertension   . Ocular migraine   . CAD (coronary artery disease)     a.  LHC 06/26/12:  pLAD 90%, prox/mid/dist CFS 20-30%, oRCA 100% (non dom), EF 55%;  b. Echo 12/13:  EF 55-60%, Gr 1 diast dysfn;   c. s/p CABG Dorris Fetch) 12/13: L-LAD/Dx    . HLD (hyperlipidemia)     Past Surgical History  Procedure Date  . Appendectomy   . Nasal septum surgery     For uncontrolled epistaxis early 2013  . Coronary artery bypass graft 06/29/2012    Procedure: CORONARY ARTERY BYPASS GRAFTING (CABG);  Surgeon: Loreli Slot, MD;  Location: [facility] OR;  Service: Open Heart Surgery;  Laterality: N/A;  times two using Left Internal Mammary Artery    Family History  Problem Relation Age of Onset  . Heart disease Brother     CABG age 29, MI ~81, redo bypass ~50  . Heart disease Paternal Grandmother   . Heart disease Cousin     Maternal side    History  Substance Use Topics  . Smoking status: Never Smoker   . Smokeless tobacco: Not on file  . Alcohol Use: Yes     Comment: 1 drink/month      Review of Systems  A complete 10 system review of systems was obtained and all systems are negative except as noted in the HPI and PMH.   Allergies  Review of patient's allergies indicates no known allergies.  Home Medications   Current Outpatient Rx  Name  Route  Sig  Dispense  Refill  . ASPIRIN EC 81 MG PO TBEC   Oral   Take 1 tablet (81  mg total) by mouth daily.         . ATORVASTATIN CALCIUM 40 MG PO TABS   Oral   Take 1 tablet (40 mg total) by mouth daily at 6 PM.   30 tablet   11   . COENZYME Q10 200 MG PO CAPS   Oral   Take 200 mg by mouth daily. disontinue while in hospital         . HYDROCODONE-ACETAMINOPHEN 5-325 MG PO TABS   Oral   Take 1 tablet by mouth every 4 (four) hours as needed for pain.   40 tablet   0   . IBUPROFEN 200 MG PO TABS   Oral   Take 600 mg by mouth every 6 (six) hours as needed. migraine headache         . LISINOPRIL 10 MG PO TABS   Oral   Take 1 tablet (10 mg total) by mouth daily.   30 tablet   11   . METOPROLOL TARTRATE 50 MG PO TABS   Oral   Take 1 tablet (50 mg total) by mouth 2 (two) times daily.   60 tablet   11   .  NITROGLYCERIN 0.4 MG SL SUBL   Sublingual   Place 1 tablet (0.4 mg total) under the tongue every 5 (five) minutes as needed for chest pain.   25 tablet   3     Triage Vitals: BP 120/87  Pulse 91  Temp 97.4 F (36.3 C) (Oral)  Resp 16  SpO2 99%  Physical Exam  Nursing note and vitals reviewed. Constitutional: He is oriented to person, place, and time. He appears well-developed and well-nourished. No distress.  HENT:  Head: Normocephalic and atraumatic.  Eyes: Conjunctivae normal and EOM are normal. Pupils are equal, round, and reactive to light.       Visual fields full to confrontation  Neck: Neck supple. No tracheal deviation present.       No meningismus   Cardiovascular: Normal rate, regular rhythm and intact distal pulses.   No murmur heard. Pulmonary/Chest: Effort normal and breath sounds normal. No respiratory distress. He exhibits no tenderness.  Abdominal: Soft. There is no tenderness.  Musculoskeletal: Normal range of motion. He exhibits no edema.  Neurological: He is alert and oriented to person, place, and time. No cranial nerve deficit.       No ataxia on finger to nose, 5/5 strength throughout, normal pronator's drift  Skin: Skin is warm and dry.  Psychiatric: He has a normal mood and affect. His behavior is normal.    ED Course  Procedures (including critical care time)  DIAGNOSTIC STUDIES: Oxygen Saturation is 99% on room air, normal by my interpretation.    COORDINATION OF CARE: 3:22 PM-Discussed treatment plan which includes Ct of head, CXR, CBC panel, troponin and UA with pt at bedside and pt agreed to plan. Pt declined pain medications and antiemetics.   4:15 PM- Consult complete with Dr. Patty Sermons, cardiology. Patient case explained and discussed. Dr. Patty Sermons advises that symptoms sound like a TIA and wants pt admitted by hospitalist to [facility]. He agrees to consult as needed. Call ended at 4:16 PM.  4:18 PM-Pt rechecked and feels improved. Informed pt  of consult with Dr. Benson Norway and plan to admit for observation. Pt agrees to plan.  4:36 PM-Consult complete with Dr. Irene Limbo. Patient case explained and discussed. Dr. Irene Limbo  agrees to admit patient for further evaluation and treatment to tele team 10. Call ended at  4:37 PM.  Labs Reviewed  COMPREHENSIVE METABOLIC PANEL - Abnormal; Notable for the following:    Alkaline Phosphatase 140 (*)     GFR calc non Af Amer 72 (*)     GFR calc Af Amer 84 (*)     All other components within normal limits  CBC WITH DIFFERENTIAL  TROPONIN I  PROTIME-INR  URINALYSIS, ROUTINE W REFLEX MICROSCOPIC   Dg Chest 2 View  08/12/2012  *RADIOLOGY REPORT*  Clinical Data: Weakness.  CHEST - 2 VIEW  Comparison: 07/21/2012  Findings: Prior CABG.  Heart is normal size.  Linear densities in the lung bases, stable, scarring versus chronic atelectasis.  No effusions.  No acute bony abnormality.  IMPRESSION: Bibasilar scarring or atelectasis, stable.  No acute findings.   Original Report Authenticated By: Charlett Nose, M.D.    Ct Head Wo Contrast  08/12/2012  *RADIOLOGY REPORT*  Clinical Data: Weakness.  CT HEAD WITHOUT CONTRAST  Technique:  Contiguous axial images were obtained from the base of the skull through the vertex without contrast.  Comparison: None.  Findings: On No acute intracranial abnormality.  Specifically, no hemorrhage, hydrocephalus, mass lesion, acute infarction, or significant intracranial injury.  No acute calvarial abnormality. Visualized paranasal sinuses and mastoids clear.  Orbital soft tissues unremarkable.  IMPRESSION: No acute intracranial abnormality.   Original Report Authenticated By: Charlett Nose, M.D.      1. TIA (transient ischemic attack)       MDM  Feeling poorly for the past day with generalized weakness and poor appetite.  Episode of blurry vision x 20 mins that resolved with some chest tightness. No headache, focal weakness, numbness, tingling.  EKG nonischemic. Troponin  negative.  Neurological exam was unremarkable. CT head unremarkable. Case discussed with Dr. Patty Sermons cardiology who agrees unlikely to be cardiac in origin. However presentation concerning for TIA. He recommended medical admission.  Discussed with Dr. Irene Limbo who will admit patient for TIA workup.   Date: 08/12/2012  Rate: 77  Rhythm: normal sinus rhythm  QRS Axis: normal  Intervals: normal  ST/T Wave abnormalities: nonspecific ST/T changes  Conduction Disutrbances:none  Narrative Interpretation: biphasic T wave v6  Old EKG Reviewed: unchanged    I personally performed the services described in this documentation, which was scribed in my presence. The recorded information has been reviewed and is accurate.        Terry Octave, MD 08/12/12 1650

## 2012-08-12 NOTE — ED Notes (Signed)
Patient transported to CT 

## 2012-08-12 NOTE — Telephone Encounter (Signed)
Transfer Request Note  Transferring Facility: [facility] Requesting physician: Glynn Octave, MD Time of request: 1630  PCP is Dr. Farris Has with [facility]  Cardiologist: Dr. Daleen Squibb  HPI: [id number] status post CABG 06/29/2012 developed lightheadedness today.blood pressure noted to be unremarkable however the patient had an episode of blurry vision, dizziness, nausea, generalized weakness and chest tightness prior to arrival at the emergency department. Symptoms resolved by presentation.  EDP was concern for possible TIA in transfer requested.  Reported Vitals at time of conversation: Benign  Reported exam: Unremarkable  Data: Negative CT head, unremarkable blood work  Reported impression: 1. Transient dizziness, nausea, generalized weakness, chest tightness  Plan based on telephone conversation: 1. Observation, telemetry, consider evaluation for TIA or chest tightness as indicated  Brendia Sacks, MD [facility] [phone] 08/12/2012, 4:35 PM

## 2012-08-13 ENCOUNTER — Observation Stay (HOSPITAL_COMMUNITY): Payer: No Typology Code available for payment source

## 2012-08-13 ENCOUNTER — Encounter (HOSPITAL_COMMUNITY): Payer: Self-pay | Admitting: *Deleted

## 2012-08-13 DIAGNOSIS — E782 Mixed hyperlipidemia: Secondary | ICD-10-CM

## 2012-08-13 DIAGNOSIS — I2510 Atherosclerotic heart disease of native coronary artery without angina pectoris: Secondary | ICD-10-CM

## 2012-08-13 LAB — TROPONIN I: Troponin I: <0.3 ng/mL (ref <0.30)

## 2012-08-13 MED ORDER — GADOBENATE DIMEGLUMINE 529 MG/ML IV SOLN
20.0000 mL | Freq: Once | INTRAVENOUS | Status: AC
  Administered 2012-08-13: 20 mL via INTRAVENOUS

## 2012-08-13 NOTE — Progress Notes (Signed)
[facility] PROGRESS NOTE  Terry Hansen XBJ:[id number] DOB: 09/01/1955 DOA: 08/12/2012 PCP: Regino Bellow, MD  Assessment/Plan: Blurry vision Etiology unclear. Presumed to be due to ocular migraine. MRI of the brain on 08/13/2012 showed no acute infarct or intracranial etiology. Has had recent carotid dopplers. Discussed with the patient about following up with an ophthalmologist in 1 week after discharge, had his eyes checked last about a year ago.  Generalized weakness Etiology unclear. Momentary weakness that patient had yesterday is resolved, likely related to blurry vision patient had yesterday. Continue to outpatient cardiac rehab. Patient walking in the room without any difficulty.  Coronary artery disease with recent CABG Cardiac enzymes negative. Continue aspirin and metoprolol.  Hyperlipidemia Continue statin.  Hypertension Stable, continue home antihypertensive medications.  Chest pain Resolved. Cardiac enzymes negative as indicated above. EKG on admission not suggestive of ischemic changes.  Code Status: Full code Family Communication: No family at bedside. Patient updated at bedside.  Disposition Plan: DC home today   Consultants:  None  Procedures:  None  Antibiotics:  None  HPI/Subjective: Denies any chest pain or blurry vision.  Objective: Filed Vitals:   08/12/12 1858 08/12/12 2130 08/13/12 0631 08/13/12 0657  BP: 111/75 123/83 123/83 120/79  Pulse: 80 84 84 72  Temp: 98 F (36.7 C) 97.9 F (36.6 C) 97.9 F (36.6 C) 97.9 F (36.6 C)  TempSrc: Oral Oral Oral Oral  Resp: 18 20 20 20   Height:   5\' 11"  (1.803 m)   Weight:   82.3 kg (181 lb 7 oz)   SpO2: 98% 98% 98% 98%    Intake/Output Summary (Last 24 hours) at 08/13/12 0849 Last data filed at 08/13/12 0800  Gross per 24 hour  Intake    360 ml  Output      0 ml  Net    360 ml   Filed Weights   08/13/12 0631  Weight: 82.3 kg (181 lb 7 oz)    Exam: Physical  Exam: General: Awake, Oriented, No acute distress. HEENT: EOMI. Neck: Supple CV: S1 and S2 Lungs: Clear to ascultation bilaterally Abdomen: Soft, Nontender, Nondistended, +bowel sounds. Ext: Good pulses. Trace edema. Chest: Well healing surgical scar.  Data Reviewed: Basic Metabolic Panel:  Lab 08/12/12 3086 08/12/12 1515  NA -- 138  K -- 3.5  CL -- 99  CO2 -- 28  GLUCOSE -- 99  BUN -- 16  CREATININE 0.96 1.10  CALCIUM -- 9.5  MG -- --  PHOS -- --   Liver Function Tests:  Lab 08/12/12 1515  AST 19  ALT 38  ALKPHOS 140*  BILITOT 0.4  PROT 7.3  ALBUMIN 3.9   No results found for this basename: LIPASE:5,AMYLASE:5 in the last 168 hours No results found for this basename: AMMONIA:5 in the last 168 hours CBC:  Lab 08/12/12 2225 08/12/12 1515  WBC 9.6 9.1  NEUTROABS -- 5.9  HGB 14.5 15.7  HCT 41.1 45.3  MCV 84.6 84.5  PLT 257 307   Cardiac Enzymes:  Lab 08/13/12 0312 08/12/12 2149 08/12/12 1515  CKTOTAL -- -- --  CKMB -- -- --  CKMBINDEX -- -- --  TROPONINI <0.30 <0.30 <0.30   BNP (last 3 results) No results found for this basename: PROBNP:3 in the last 8760 hours CBG: No results found for this basename: GLUCAP:5 in the last 168 hours  No results found for this or any previous visit (from the past 240 hour(s)).   Studies: Dg Chest 2 View  08/12/2012  *  RADIOLOGY REPORT*  Clinical Data: Weakness.  CHEST - 2 VIEW  Comparison: 07/21/2012  Findings: Prior CABG.  Heart is normal size.  Linear densities in the lung bases, stable, scarring versus chronic atelectasis.  No effusions.  No acute bony abnormality.  IMPRESSION: Bibasilar scarring or atelectasis, stable.  No acute findings.   Original Report Authenticated By: Charlett Nose, M.D.    Ct Head Wo Contrast  08/12/2012  *RADIOLOGY REPORT*  Clinical Data: Weakness.  CT HEAD WITHOUT CONTRAST  Technique:  Contiguous axial images were obtained from the base of the skull through the vertex without contrast.   Comparison: None.  Findings: On No acute intracranial abnormality.  Specifically, no hemorrhage, hydrocephalus, mass lesion, acute infarction, or significant intracranial injury.  No acute calvarial abnormality. Visualized paranasal sinuses and mastoids clear.  Orbital soft tissues unremarkable.  IMPRESSION: No acute intracranial abnormality.   Original Report Authenticated By: Charlett Nose, M.D.     Scheduled Meds:   . aspirin EC  81 mg Oral Daily  . atorvastatin  40 mg Oral q1800  . docusate sodium  100 mg Oral BID  . enoxaparin (LOVENOX) injection  40 mg Subcutaneous Q24H  . lisinopril  10 mg Oral Daily  . metoprolol  50 mg Oral BID  . sodium chloride  3 mL Intravenous Q12H  . sodium chloride  3 mL Intravenous Q12H   Continuous Infusions:   Active Problems:  Migraine  CAD (coronary artery disease)  TIA (transient ischemic attack)  Chest pain    Kathyleen Radice A, MD  [facility] Pager [phone]. If 7PM-7AM, please contact night-coverage at [email or web address], password [email or web address] 08/13/2012, 8:49 AM  LOS: 1 day

## 2012-08-13 NOTE — Progress Notes (Signed)
Patient picked up by [facility], transporter on the way to MRI.

## 2012-08-13 NOTE — Discharge Summary (Signed)
Physician Discharge Summary  Concepcion Kirkpatrick ZHY:[id number] DOB: 1956-04-29 DOA: 08/12/2012  PCP: Regino Bellow, MD  Admit date: 08/12/2012 Discharge date: 08/13/2012  Time spent: 25 minutes  Recommendations for Outpatient Follow-up:  Followup with Regino Bellow, MD (PCP) in 1 week.  Continue cardiac rehab after discharge.  Please followup with opthalmology (eye doctor) after discharge in 1 week.  Discharge Diagnoses:  Principal Problem:  *Migraine Active Problems:  CAD (coronary artery disease)  TIA (transient ischemic attack)  Chest pain   Discharge Condition: Stable  Diet recommendation: Heart healthy diet.  Filed Weights   08/13/12 0631  Weight: 82.3 kg (181 lb 7 oz)    History of present illness:  On admission: "Terry Hansen is an 57 y.o. male with hx of ocular migraine, manifests with blurry vx with or without HA, Hx of CAD, s/p CABGx2 only 6 weeks ago, HTN, Hyperlipidemia, presents to [facility] with complaints of dim vision lasting about 2 minutes this morning. He also felt some chest tightness and generalized weakness."  Hospital Course:  Blurry vision  Etiology unclear, resolved after few minutes. Presumed to be due to ocular migraine. MRI of the brain on 08/13/2012 showed no acute infarct or intracranial etiology. Has had recent carotid dopplers. 2D Echo done recently on 06/26/2012 reviewed. Discussed with the patient about following up with an ophthalmologist in 1 week after discharge, had his eyes checked last about a year ago.   Generalized weakness  Etiology unclear. Momentary weakness that patient had yesterday is resolved, likely related to blurry vision patient had yesterday. Continue to outpatient cardiac rehab. Patient walking in the room without any difficulty.   Coronary artery disease with recent CABG  Cardiac enzymes negative. Continue aspirin and metoprolol.   Hyperlipidemia  Continue statin.   Hypertension  Stable, continue home  antihypertensive medications.   Chest pain  Resolved. Cardiac enzymes negative as indicated above. EKG on admission not suggestive of ischemic changes.   Consultants:  None  Procedures:  None  Antibiotics:  None  Discharge Exam: Filed Vitals:   08/12/12 1858 08/12/12 2130 08/13/12 0631 08/13/12 0657  BP: 111/75 123/83 123/83 120/79  Pulse: 80 84 84 72  Temp: 98 F (36.7 C) 97.9 F (36.6 C) 97.9 F (36.6 C) 97.9 F (36.6 C)  TempSrc: Oral Oral Oral Oral  Resp: 18 20 20 20   Height:   5\' 11"  (1.803 m)   Weight:   82.3 kg (181 lb 7 oz)   SpO2: 98% 98% 98% 98%   Discharge Instructions  Discharge Orders    Future Appointments: Provider: Department: Dept Phone: Center:   08/14/2012 9:45 AM [facility] CARDIAC REHAB [phone] None   08/17/2012 9:45 AM [facility] 18 [facility] CARDIAC REHAB [phone] None   08/19/2012 9:45 AM [facility] 18 [facility] CARDIAC REHAB [phone] None   08/21/2012 9:45 AM [facility] 18 [facility] CARDIAC REHAB [phone] None   08/24/2012 9:45 AM [facility] Monitor 18 [facility] CARDIAC REHAB [phone] None   08/26/2012 9:45 AM [facility] 18 [facility] CARDIAC REHAB [phone] None   08/28/2012 9:45 AM [facility] Monitor 18 [facility] CARDIAC REHAB [phone] None   08/31/2012 9:45 AM [facility] Monitor 18 [facility] CARDIAC REHAB [phone] None   09/01/2012 9:00 AM [facility] Main Office [place]) [phone] [facility]   09/02/2012 9:45 AM [facility] 18 [facility] CARDIAC [facility]  [phone] None   09/04/2012 9:45 AM [facility] Monitor 18 [facility] CARDIAC REHAB [phone] None   09/25/2012 2:30 PM Gaylord Shih, MD [facility] Main Office [place]) [phone] [facility]     Future  Orders Please Complete By Expires   Diet - low sodium heart healthy      Increase activity slowly      Discharge instructions      Comments:   Followup with Regino Bellow, MD (PCP) in 1 week.  Continue cardiac rehab after discharge.  Please followup with opthalmology (eye doctor) after discharge.       Medication List     As of 08/13/2012 10:41 AM    TAKE these medications         aspirin EC 81 MG tablet   Take 1 tablet (81 mg total) by mouth daily.      atorvastatin 40 MG tablet   Commonly known as: LIPITOR   Take 1 tablet (40 mg total) by mouth daily at 6 PM.      Coenzyme Q10 200 MG capsule   Take 200 mg by mouth daily. disontinue while in hospital      HYDROcodone-acetaminophen 5-325 MG per tablet   Commonly known as: NORCO/VICODIN   Take 1 tablet by mouth every 4 (four) hours as needed for pain.      ibuprofen 200 MG tablet   Commonly known as: ADVIL,MOTRIN   Take 600 mg by mouth every 6 (six) hours as needed. migraine headache      lisinopril 10 MG tablet   Commonly known as: PRINIVIL,ZESTRIL   Take 1 tablet (10 mg total) by mouth daily.      metoprolol 50 MG tablet   Commonly known as: LOPRESSOR   Take 1 tablet (50 mg total) by mouth 2 (two) times daily.      nitroGLYCERIN 0.4 MG SL tablet   Commonly known as: NITROSTAT   Place 1 tablet (0.4 mg total) under the tongue every 5 (five) minutes as needed for chest pain.          The results of significant diagnostics from this hospitalization (including imaging, microbiology, ancillary and laboratory) are listed below for reference.    Significant Diagnostic Studies: Dg Chest 2 View  08/12/2012  *RADIOLOGY REPORT*  Clinical Data: Weakness.  CHEST - 2 VIEW  Comparison: 07/21/2012  Findings: Prior CABG.  Heart is normal size.  Linear densities in the lung bases, stable, scarring versus chronic atelectasis.  No effusions.  No acute bony abnormality.  IMPRESSION: Bibasilar scarring or atelectasis, stable.   No acute findings.   Original Report Authenticated By: Charlett Nose, M.D.    Dg Chest 2 View  07/21/2012  *RADIOLOGY REPORT*  Clinical Data: Heart surgery 3 weeks ago.  Soreness.  CHEST - 2 VIEW  Comparison: 07/01/2012.  Findings: Minimal basilar atelectatic changes.  Interval improved aeration lung bases.  No pneumothorax or pulmonary edema.  Mild central pulmonary vascular prominence.  No segmental consolidation.  Post CABG.  No interruption of sternal wires.  IMPRESSION: Post CABG.  Minimal basilar subsegmental atelectatic changes.   Original Report Authenticated By: Lacy Duverney, M.D.    Ct Head Wo Contrast  08/12/2012  *RADIOLOGY REPORT*  Clinical Data: Weakness.  CT HEAD WITHOUT CONTRAST  Technique:  Contiguous axial images were obtained from the base of the skull through the vertex without contrast.  Comparison: None.  Findings: On No acute intracranial abnormality.  Specifically, no hemorrhage, hydrocephalus, mass lesion, acute infarction,  or significant intracranial injury.  No acute calvarial abnormality. Visualized paranasal sinuses and mastoids clear.  Orbital soft tissues unremarkable.  IMPRESSION: No acute intracranial abnormality.   Original Report Authenticated By: Charlett Nose, M.D.    Mr Laqueta Jean Wo Contrast  08/13/2012  *RADIOLOGY REPORT*  Clinical Data: 57 year old male with ocular migraines presenting with blurred vision with or without headache.  History of coronary artery disease post CABG 6 weeks ago.  Hypertension and hyperlipidemia.  Symptoms resolved spontaneously and have not recurred.  Question TIA.  MRI HEAD WITHOUT AND WITH CONTRAST  Technique:  Multiplanar, multiecho pulse sequences of the brain and surrounding structures were obtained according to standard protocol without and with intravenous contrast  Contrast: 20mL MULTIHANCE GADOBENATE DIMEGLUMINE 529 MG/ML IV SOLN  Comparison: 08/12/2012 head CT.  No comparison brain MR.  Findings: No acute infarct.  No intracranial  hemorrhage.  No intracranial mass or abnormal enhancement.  Major intracranial vascular structures are patent.  The vertebral arteries and basilar artery are small in caliber.  Cervical medullary junction and unremarkable.  Sella is partially empty but not expanded.  Orbital structures appear be grossly intact.  Polypoid opacification maxillary sinuses.  Mild mucosal thickening ethmoid sinuses and the right frontal sinus air cells.  IMPRESSION: Paranasal sinus disease as detailed above.  No acute infarct.  No intracranial mass or abnormal enhancement.   Original Report Authenticated By: Lacy Duverney, M.D.     Microbiology: No results found for this or any previous visit (from the past 240 hour(s)).   Labs: Basic Metabolic Panel:  Lab 08/12/12 1610 08/12/12 1515  NA -- 138  K -- 3.5  CL -- 99  CO2 -- 28  GLUCOSE -- 99  BUN -- 16  CREATININE 0.96 1.10  CALCIUM -- 9.5  MG -- --  PHOS -- --   Liver Function Tests:  Lab 08/12/12 1515  AST 19  ALT 38  ALKPHOS 140*  BILITOT 0.4  PROT 7.3  ALBUMIN 3.9   No results found for this basename: LIPASE:5,AMYLASE:5 in the last 168 hours No results found for this basename: AMMONIA:5 in the last 168 hours CBC:  Lab 08/12/12 2225 08/12/12 1515  WBC 9.6 9.1  NEUTROABS -- 5.9  HGB 14.5 15.7  HCT 41.1 45.3  MCV 84.6 84.5  PLT 257 307   Cardiac Enzymes:  Lab 08/13/12 0910 08/13/12 0312 08/12/12 2149 08/12/12 1515  CKTOTAL -- -- -- --  CKMB -- -- -- --  CKMBINDEX -- -- -- --  TROPONINI <0.30 <0.30 <0.30 <0.30   BNP: BNP (last 3 results) No results found for this basename: PROBNP:3 in the last 8760 hours CBG: No results found for this basename: GLUCAP:5 in the last 168 hours     Signed:  Anysa Tacey A  [facility] 08/13/2012, 10:41 AM

## 2012-08-13 NOTE — Plan of Care (Signed)
Problem: Phase I Progression Outcomes Goal: Antithrombotic given by end of Day 2 Outcome: Not Met (add Reason) Patient refused

## 2012-08-13 NOTE — Care Management Note (Signed)
    Page 1 of 1   08/13/2012     3:19:51 PM   CARE MANAGEMENT NOTE 08/13/2012  Patient:  Terry Hansen, Terry Hansen   Account Number:  [id number]  Date Initiated:  08/13/2012  Documentation initiated by:  Jacquelynn Cree  Subjective/Objective Assessment:   admitted with occular migrainei     Action/Plan:   return home, resume outpatient cardiac rehab   Anticipated DC Date:  08/13/2012   Anticipated DC Plan:  HOME/SELF CARE      DC Planning Services  CM consult      Choice offered to / List presented to:             Status of service:  Completed, signed off Medicare Important Message given?   (If response is "NO", the following Medicare IM given date fields will be blank) Date Medicare IM given:   Date Additional Medicare IM given:    Discharge Disposition:  HOME/SELF CARE  Per UR Regulation:  Reviewed for med. necessity/level of care/duration of stay  If discussed at Long Length of Stay Meetings, dates discussed:    Comments:  08/13/12 Spoke with patient about outpatient cardiac rehab, he goes to the [facility] MWF. Contacted [facility], spoke with Liborio Nixon, she requested that I fax order to resume outpatient cardiac rehab to [phone].Faxed requested order and confirmed receipt with Liborio Nixon. Informed patient that order was faxed to [facility] and that Dr. Betti Cruz felt that it would be best for him to attend his 08/14/12 session. Jacquelynn Cree RN, BSN, CCM

## 2012-08-13 NOTE — Progress Notes (Signed)
Pt AVS and discharge instruction given TIA/stroke and Mi teaching reinforced pt verbalized and demonstrated good understanding by teach back  Follow up  With   Md and cadiac Rehab  appointment  reinforced.  Saline L removed, intact , vital signs  Stable, pt denied any c/o of pain. Condition upon discharge  Home was stable. Ashley Royalty RN

## 2012-08-14 ENCOUNTER — Encounter (HOSPITAL_COMMUNITY)
Admission: RE | Admit: 2012-08-14 | Discharge: 2012-08-14 | Disposition: A | Discharge status: Home / Self Care | Payer: No Typology Code available for payment source | Source: Ambulatory Visit | Attending: Cardiology | Admitting: Cardiology

## 2012-08-14 NOTE — Progress Notes (Signed)
Doug returned to cardiac rehab blood pressure stable. No complaints voiced.Will continue to monitor the patient throughout  the program.

## 2012-08-17 ENCOUNTER — Encounter (HOSPITAL_COMMUNITY)
Admission: RE | Admit: 2012-08-17 | Discharge: 2012-08-17 | Disposition: A | Discharge status: Home / Self Care | Payer: No Typology Code available for payment source | Source: Ambulatory Visit | Attending: Cardiology | Admitting: Cardiology

## 2012-08-17 NOTE — Progress Notes (Signed)
Reviewed home exercise with pt today.  Pt plans to walk and use bike and exercise room at home for exercise.  Reviewed THR, pulse, RPE, sign and symptoms, NTG use, and when to call 911 or MD.  Pt voiced understanding. Fabio Pierce, MA, ACSM RCEP

## 2012-08-19 ENCOUNTER — Encounter (HOSPITAL_COMMUNITY): Payer: No Typology Code available for payment source

## 2012-08-21 ENCOUNTER — Encounter (HOSPITAL_COMMUNITY)
Admission: RE | Admit: 2012-08-21 | Discharge: 2012-08-21 | Disposition: A | Discharge status: Home / Self Care | Payer: No Typology Code available for payment source | Source: Ambulatory Visit | Attending: Cardiology | Admitting: Cardiology

## 2012-08-24 ENCOUNTER — Encounter (HOSPITAL_COMMUNITY)
Admission: RE | Admit: 2012-08-24 | Discharge: 2012-08-24 | Disposition: A | Discharge status: Home / Self Care | Payer: No Typology Code available for payment source | Source: Ambulatory Visit | Attending: Cardiology | Admitting: Cardiology

## 2012-08-24 DIAGNOSIS — Z5189 Encounter for other specified aftercare: Secondary | ICD-10-CM | POA: Insufficient documentation

## 2012-08-24 DIAGNOSIS — I2510 Atherosclerotic heart disease of native coronary artery without angina pectoris: Secondary | ICD-10-CM | POA: Insufficient documentation

## 2012-08-24 DIAGNOSIS — I10 Essential (primary) hypertension: Secondary | ICD-10-CM | POA: Insufficient documentation

## 2012-08-24 DIAGNOSIS — E782 Mixed hyperlipidemia: Secondary | ICD-10-CM | POA: Insufficient documentation

## 2012-08-24 DIAGNOSIS — I200 Unstable angina: Secondary | ICD-10-CM | POA: Insufficient documentation

## 2012-08-26 ENCOUNTER — Encounter (HOSPITAL_COMMUNITY)
Admission: RE | Admit: 2012-08-26 | Discharge: 2012-08-26 | Disposition: A | Discharge status: Home / Self Care | Payer: No Typology Code available for payment source | Source: Ambulatory Visit | Attending: Cardiology | Admitting: Cardiology

## 2012-08-28 ENCOUNTER — Encounter (HOSPITAL_COMMUNITY)
Admission: RE | Admit: 2012-08-28 | Discharge: 2012-08-28 | Disposition: A | Discharge status: Home / Self Care | Payer: No Typology Code available for payment source | Source: Ambulatory Visit | Attending: Cardiology | Admitting: Cardiology

## 2012-08-28 NOTE — Progress Notes (Signed)
Terry Hansen said he has been having difficulty sleeping at night recently.  Terry Hansen is going back to work on Monday and is anxious due to the stress involved with his job.  Advised Doug to try chamomile tea and soft music to help with going to sleep.  Terry Hansen says he is avoiding caffeine.

## 2012-08-28 NOTE — Progress Notes (Signed)
Terry Hansen said he forgot to take his evening medications and had a headache early this morning. Terry Hansen denies having a headache today at cardiac rehab.  Doug exercised without difficulty today.  Entry heart rate was a little faster that usual at 100 otherwise vital signs stable. Will continue to monitor the patient throughout  the program.

## 2012-08-31 ENCOUNTER — Encounter (HOSPITAL_COMMUNITY)
Admission: RE | Admit: 2012-08-31 | Discharge: 2012-08-31 | Disposition: A | Discharge status: Home / Self Care | Payer: No Typology Code available for payment source | Source: Ambulatory Visit | Attending: Cardiology | Admitting: Cardiology

## 2012-09-01 ENCOUNTER — Other Ambulatory Visit (INDEPENDENT_AMBULATORY_CARE_PROVIDER_SITE_OTHER): Payer: No Typology Code available for payment source

## 2012-09-01 DIAGNOSIS — I10 Essential (primary) hypertension: Secondary | ICD-10-CM

## 2012-09-01 DIAGNOSIS — E785 Hyperlipidemia, unspecified: Secondary | ICD-10-CM

## 2012-09-01 DIAGNOSIS — I2510 Atherosclerotic heart disease of native coronary artery without angina pectoris: Secondary | ICD-10-CM

## 2012-09-01 LAB — LIPID PANEL
HDL: 34.7 mg/dL — ABNORMAL LOW (ref >39.00)
LDL Cholesterol: 48 mg/dL (ref 0–99)
Total CHOL/HDL Ratio: 3
VLDL: 15.6 mg/dL (ref 0.0–40.0)

## 2012-09-01 LAB — BASIC METABOLIC PANEL
Chloride: 103 mEq/L (ref 96–112)
GFR: 85.21 mL/min (ref >60.00)
Glucose, Bld: 96 mg/dL (ref 70–99)
Potassium: 4.1 mEq/L (ref 3.5–5.1)
Sodium: 137 mEq/L (ref 135–145)

## 2012-09-01 LAB — HEPATIC FUNCTION PANEL
ALT: 37 U/L (ref 0–53)
Total Bilirubin: 0.8 mg/dL (ref 0.3–1.2)

## 2012-09-02 ENCOUNTER — Encounter (HOSPITAL_COMMUNITY)
Admission: RE | Admit: 2012-09-02 | Discharge: 2012-09-02 | Disposition: A | Discharge status: Home / Self Care | Payer: No Typology Code available for payment source | Source: Ambulatory Visit | Attending: Cardiology | Admitting: Cardiology

## 2012-09-04 ENCOUNTER — Encounter (HOSPITAL_COMMUNITY): Payer: No Typology Code available for payment source

## 2012-09-07 ENCOUNTER — Encounter (HOSPITAL_COMMUNITY)
Admission: RE | Admit: 2012-09-07 | Discharge: 2012-09-07 | Disposition: A | Discharge status: Home / Self Care | Payer: No Typology Code available for payment source | Source: Ambulatory Visit | Attending: Cardiology | Admitting: Cardiology

## 2012-09-07 ENCOUNTER — Telehealth: Payer: Self-pay | Admitting: *Deleted

## 2012-09-07 NOTE — Telephone Encounter (Signed)
Message copied by Tarri Fuller on Mon Sep 07, 2012 11:33 AM ------      Message from: [place], [place] T      Created: Wed Sep 02, 2012  1:30 PM       Lipids good      LFTs, K+ and creatinine ok      Continue with current treatment plan.      Tereso Newcomer, PA-C  1:29 PM 09/02/2012 ------

## 2012-09-07 NOTE — Telephone Encounter (Signed)
lmom labs ok, no changes to be made 

## 2012-09-09 ENCOUNTER — Encounter (HOSPITAL_COMMUNITY)
Admission: RE | Admit: 2012-09-09 | Discharge: 2012-09-09 | Disposition: A | Discharge status: Home / Self Care | Payer: No Typology Code available for payment source | Source: Ambulatory Visit | Attending: Cardiology | Admitting: Cardiology

## 2012-09-09 NOTE — Progress Notes (Signed)
Seraphim Trow 57 y.o. male Nutrition Note Spoke with pt.  Nutrition Plan and Nutrition Survey goals reviewed with pt. Pt is following Step 2 of the Therapeutic Lifestyle Changes diet. Pt want to maintain the "almost 20 lbs I lost after my surgery." Weight maintenance discussed. Pt reports his appetite has increased recently and taste changes have remained. Pt expressed understanding of the information reviewed. Pt aware of nutrition education classes offered and has attended the Nutrition I class and plans on attending the Nutrition II class.  Nutrition Diagnosis   Food-and nutrition-related knowledge deficit related to lack of exposure to information as related to diagnosis of: ? CVD ?   Nutrition RX/ Estimated Daily Nutrition Needs for: wt maintenance 2350-2700 Kcal, 75-85 gm fat, 15-18 gm sat fat, 2.3-2.7 gm trans-fat, <1500 mg sodium   Nutrition Intervention   Pt's individual nutrition plan reviewed with pt.   Benefits of adopting Therapeutic Lifestyle Changes discussed when Medficts reviewed.   Pt to attend the Portion Distortion class   Pt to attend the  ? Nutrition I class - met 08/25/12                    ? Nutrition II class   Continue client-centered nutrition education by RD, as part of interdisciplinary care. Goal(s)   Pt to describe the benefit of including fruits, vegetables, whole grains, and low-fat dairy products in a heart healthy meal plan. Monitor and Evaluate progress toward nutrition goal with team. Nutrition Risk: Change to Moderate

## 2012-09-11 ENCOUNTER — Encounter (HOSPITAL_COMMUNITY)
Admission: RE | Admit: 2012-09-11 | Discharge: 2012-09-11 | Disposition: A | Discharge status: Home / Self Care | Payer: No Typology Code available for payment source | Source: Ambulatory Visit | Attending: Cardiology | Admitting: Cardiology

## 2012-09-11 DIAGNOSIS — Z0279 Encounter for issue of other medical certificate: Secondary | ICD-10-CM

## 2012-09-14 ENCOUNTER — Encounter (HOSPITAL_COMMUNITY)
Admission: RE | Admit: 2012-09-14 | Discharge: 2012-09-14 | Disposition: A | Discharge status: Home / Self Care | Payer: No Typology Code available for payment source | Source: Ambulatory Visit | Attending: Cardiology | Admitting: Cardiology

## 2012-09-14 NOTE — Progress Notes (Signed)
Terry Hansen says his mother is not doing well and hospice is involved.  Emotional support provided.

## 2012-09-16 ENCOUNTER — Encounter (HOSPITAL_COMMUNITY): Payer: No Typology Code available for payment source

## 2012-09-17 ENCOUNTER — Encounter: Payer: Self-pay | Admitting: *Deleted

## 2012-09-18 ENCOUNTER — Encounter (HOSPITAL_COMMUNITY): Payer: No Typology Code available for payment source

## 2012-09-21 ENCOUNTER — Encounter (HOSPITAL_COMMUNITY)
Admission: RE | Admit: 2012-09-21 | Discharge: 2012-09-21 | Disposition: A | Discharge status: Home / Self Care | Payer: No Typology Code available for payment source | Source: Ambulatory Visit | Attending: Cardiology | Admitting: Cardiology

## 2012-09-21 DIAGNOSIS — Z5189 Encounter for other specified aftercare: Secondary | ICD-10-CM | POA: Insufficient documentation

## 2012-09-21 DIAGNOSIS — I10 Essential (primary) hypertension: Secondary | ICD-10-CM | POA: Insufficient documentation

## 2012-09-21 DIAGNOSIS — I2510 Atherosclerotic heart disease of native coronary artery without angina pectoris: Secondary | ICD-10-CM | POA: Insufficient documentation

## 2012-09-21 DIAGNOSIS — I200 Unstable angina: Secondary | ICD-10-CM | POA: Insufficient documentation

## 2012-09-21 DIAGNOSIS — E782 Mixed hyperlipidemia: Secondary | ICD-10-CM | POA: Insufficient documentation

## 2012-09-23 ENCOUNTER — Encounter (HOSPITAL_COMMUNITY)
Admission: RE | Admit: 2012-09-23 | Discharge: 2012-09-23 | Disposition: A | Discharge status: Home / Self Care | Payer: No Typology Code available for payment source | Source: Ambulatory Visit | Attending: Cardiology | Admitting: Cardiology

## 2012-09-25 ENCOUNTER — Encounter (HOSPITAL_COMMUNITY): Payer: No Typology Code available for payment source

## 2012-09-25 ENCOUNTER — Encounter: Payer: No Typology Code available for payment source | Admitting: Cardiology

## 2012-09-28 ENCOUNTER — Encounter (HOSPITAL_COMMUNITY)
Admission: RE | Admit: 2012-09-28 | Discharge: 2012-09-28 | Disposition: A | Discharge status: Home / Self Care | Payer: No Typology Code available for payment source | Source: Ambulatory Visit | Attending: Cardiology | Admitting: Cardiology

## 2012-09-30 ENCOUNTER — Encounter (HOSPITAL_COMMUNITY)
Admission: RE | Admit: 2012-09-30 | Discharge: 2012-09-30 | Disposition: A | Discharge status: Home / Self Care | Payer: No Typology Code available for payment source | Source: Ambulatory Visit | Attending: Cardiology | Admitting: Cardiology

## 2012-10-01 ENCOUNTER — Encounter: Payer: Self-pay | Admitting: Physician Assistant

## 2012-10-01 ENCOUNTER — Ambulatory Visit (INDEPENDENT_AMBULATORY_CARE_PROVIDER_SITE_OTHER): Payer: No Typology Code available for payment source | Admitting: Physician Assistant

## 2012-10-01 VITALS — BP 118/66 | HR 62 | Ht 71.0 in | Wt 184.8 lb

## 2012-10-01 DIAGNOSIS — E785 Hyperlipidemia, unspecified: Secondary | ICD-10-CM

## 2012-10-01 DIAGNOSIS — I2510 Atherosclerotic heart disease of native coronary artery without angina pectoris: Secondary | ICD-10-CM

## 2012-10-01 DIAGNOSIS — I10 Essential (primary) hypertension: Secondary | ICD-10-CM

## 2012-10-01 NOTE — Patient Instructions (Addendum)
Your physician wants you to follow-up in: 6 MONTHS WITH DR. Eden Emms. You will receive a reminder letter in the mail two months in advance. If you don't receive a letter, please call our office to schedule the follow-up appointment.   NO CHANGES WERE MADE TODAY

## 2012-10-01 NOTE — Progress Notes (Signed)
[address], [address] [place], [place]  [address] Phone: [phone], Fax:  [phone]  Date:  10/01/2012   ID:  Terry Hansen, DOB [birth year], MRN [id number]  PCP:  Regino Bellow, MD  Primary Cardiologist:  Dr. Valera Castle => will switch to Dr. Charlton Haws    History of Present Illness: Terry Hansen is a 57 y.o. male who returns for follow up.  He has a hx of CAD, HTN, HL and FHx of CAD. He was admitted in 06/2012 with chest pain and LHC with Dr. Eden Emms demonstrated high-grade proximal LAD lesion as well as an occluded RCA. EF was normal by echocardiogram. He had CABG with Dr. Dorris Fetch.  Last seen here in 06/2012.  Doing well.  Participating in cardiac rehab.  The patient denies chest pain, shortness of breath, syncope, orthopnea, PND or significant pedal edema.   Labs (12/13): K 3.8, creatinine 0.92, Mg 2.1, LDL 88, Hgb 11.5  Labs (2/14):   K 4.1, creatinine 1.0, ALT 37, LDL 48  Wt Readings from Last 3 Encounters:  10/01/12 184 lb 12.8 oz (83.825 kg)  08/13/12 181 lb 7 oz (82.3 kg)  08/06/12 183 lb 10.3 oz (83.3 kg)     Past Medical History  Diagnosis Date  . Hypertension   . Ocular migraine   . CAD (coronary artery disease)     a.  LHC 06/26/12:  pLAD 90%, prox/mid/dist CFS 20-30%, oRCA 100% (non dom), EF 55%;  b. Echo 12/13:  EF 55-60%, Gr 1 diast dysfn;  c. s/p CABG Dorris Fetch) 12/13: L-LAD/Dx    . HLD (hyperlipidemia)     Current Outpatient Prescriptions  Medication Sig Dispense Refill  . aspirin EC 81 MG tablet Take 1 tablet (81 mg total) by mouth daily.      Marland Kitchen atorvastatin (LIPITOR) 40 MG tablet Take 1 tablet (40 mg total) by mouth daily at 6 PM.  30 tablet  11  . Coenzyme Q10 200 MG capsule Take 200 mg by mouth daily. disontinue while in hospital      . ibuprofen (ADVIL,MOTRIN) 200 MG tablet Take 600 mg by mouth every 6 (six) hours as needed. migraine headache      . lisinopril (PRINIVIL,ZESTRIL) 10 MG tablet Take 1 tablet (10 mg total) by  mouth daily.  30 tablet  11  . metoprolol (LOPRESSOR) 50 MG tablet Take 1 tablet (50 mg total) by mouth 2 (two) times daily.  60 tablet  11  . nitroGLYCERIN (NITROSTAT) 0.4 MG SL tablet Place 1 tablet (0.4 mg total) under the tongue every 5 (five) minutes as needed for chest pain.  25 tablet  3   No current facility-administered medications for this visit.    Allergies:   No Known Allergies  Social History:  The patient  reports that he has never smoked. He does not have any smokeless tobacco history on file. He reports that  drinks alcohol. He reports that he does not use illicit drugs.   ROS:  Please see the history of present illness.    All other systems reviewed and negative.   PHYSICAL EXAM: VS:  BP 118/66  Pulse 62  Ht 5\' 11"  (1.803 m)  Wt 184 lb 12.8 oz (83.825 kg)  BMI 25.79 kg/m2 Well nourished, well developed, in no acute distress HEENT: normal Neck: no JVD Cardiac:  normal S1, S2; RRR; no murmur Lungs:  clear to auscultation bilaterally, no wheezing, rhonchi or rales Abd: soft, nontender, no hepatomegaly Ext: no edema Skin: warm  and dry Neuro:  CNs 2-12 intact, no focal abnormalities noted  EKG:  NSR, HR 62, iRBBB     ASSESSMENT AND PLAN:  1. Coronary Artery Disease:  Doing well.  No angina.  Continue ASA, statin.  He plans to remain in cardiac rehab and participate in the maintenance program.  2. Hypertension:  Controlled.  Continue current therapy.  3. Hyperlipidemia:  Well controlled.  Continue current Rx. 4. Disposition:  Follow up with Dr. Charlton Haws in 6 mos.   Signed, Tereso Newcomer, PA-C  2:44 PM 10/01/2012

## 2012-10-02 ENCOUNTER — Encounter (HOSPITAL_COMMUNITY): Payer: No Typology Code available for payment source

## 2012-10-05 ENCOUNTER — Encounter (HOSPITAL_COMMUNITY)
Admission: RE | Admit: 2012-10-05 | Discharge: 2012-10-05 | Disposition: A | Discharge status: Home / Self Care | Payer: No Typology Code available for payment source | Source: Ambulatory Visit | Attending: Cardiology | Admitting: Cardiology

## 2012-10-07 ENCOUNTER — Encounter (HOSPITAL_COMMUNITY)
Admission: RE | Admit: 2012-10-07 | Discharge: 2012-10-07 | Disposition: A | Discharge status: Home / Self Care | Payer: No Typology Code available for payment source | Source: Ambulatory Visit | Attending: Cardiology | Admitting: Cardiology

## 2012-10-09 ENCOUNTER — Encounter (HOSPITAL_COMMUNITY): Payer: No Typology Code available for payment source

## 2012-10-12 ENCOUNTER — Encounter (HOSPITAL_COMMUNITY)
Admission: RE | Admit: 2012-10-12 | Discharge: 2012-10-12 | Disposition: A | Discharge status: Home / Self Care | Payer: No Typology Code available for payment source | Source: Ambulatory Visit | Attending: Cardiology | Admitting: Cardiology

## 2012-10-14 ENCOUNTER — Encounter (HOSPITAL_COMMUNITY)
Admission: RE | Admit: 2012-10-14 | Discharge: 2012-10-14 | Disposition: A | Discharge status: Home / Self Care | Payer: No Typology Code available for payment source | Source: Ambulatory Visit | Attending: Cardiology | Admitting: Cardiology

## 2012-10-16 ENCOUNTER — Encounter (HOSPITAL_COMMUNITY)
Admission: RE | Admit: 2012-10-16 | Discharge: 2012-10-16 | Disposition: A | Discharge status: Home / Self Care | Payer: No Typology Code available for payment source | Source: Ambulatory Visit | Attending: Cardiology | Admitting: Cardiology

## 2012-10-19 ENCOUNTER — Encounter (HOSPITAL_COMMUNITY): Payer: No Typology Code available for payment source

## 2012-10-21 ENCOUNTER — Encounter (HOSPITAL_COMMUNITY)
Admission: RE | Admit: 2012-10-21 | Discharge: 2012-10-21 | Disposition: A | Discharge status: Home / Self Care | Payer: No Typology Code available for payment source | Source: Ambulatory Visit | Attending: Cardiology | Admitting: Cardiology

## 2012-10-21 DIAGNOSIS — I2510 Atherosclerotic heart disease of native coronary artery without angina pectoris: Secondary | ICD-10-CM | POA: Insufficient documentation

## 2012-10-21 DIAGNOSIS — I200 Unstable angina: Secondary | ICD-10-CM | POA: Insufficient documentation

## 2012-10-21 DIAGNOSIS — E782 Mixed hyperlipidemia: Secondary | ICD-10-CM | POA: Insufficient documentation

## 2012-10-21 DIAGNOSIS — Z5189 Encounter for other specified aftercare: Secondary | ICD-10-CM | POA: Insufficient documentation

## 2012-10-21 DIAGNOSIS — I10 Essential (primary) hypertension: Secondary | ICD-10-CM | POA: Insufficient documentation

## 2012-10-23 ENCOUNTER — Encounter (HOSPITAL_COMMUNITY)
Admission: RE | Admit: 2012-10-23 | Discharge: 2012-10-23 | Disposition: A | Discharge status: Home / Self Care | Payer: No Typology Code available for payment source | Source: Ambulatory Visit | Attending: Cardiology | Admitting: Cardiology

## 2012-10-26 ENCOUNTER — Encounter (HOSPITAL_COMMUNITY)
Admission: RE | Admit: 2012-10-26 | Discharge: 2012-10-26 | Disposition: A | Discharge status: Home / Self Care | Payer: No Typology Code available for payment source | Source: Ambulatory Visit | Attending: Cardiology | Admitting: Cardiology

## 2012-10-28 ENCOUNTER — Encounter (HOSPITAL_COMMUNITY)
Admission: RE | Admit: 2012-10-28 | Discharge: 2012-10-28 | Disposition: A | Discharge status: Home / Self Care | Payer: No Typology Code available for payment source | Source: Ambulatory Visit | Attending: Cardiology | Admitting: Cardiology

## 2012-10-30 ENCOUNTER — Encounter (HOSPITAL_COMMUNITY)
Admission: RE | Admit: 2012-10-30 | Discharge: 2012-10-30 | Disposition: A | Discharge status: Home / Self Care | Payer: No Typology Code available for payment source | Source: Ambulatory Visit | Attending: Cardiology | Admitting: Cardiology

## 2012-11-02 ENCOUNTER — Encounter (HOSPITAL_COMMUNITY)
Admission: RE | Admit: 2012-11-02 | Discharge: 2012-11-02 | Disposition: A | Discharge status: Home / Self Care | Payer: No Typology Code available for payment source | Source: Ambulatory Visit | Attending: Cardiology | Admitting: Cardiology

## 2012-11-04 ENCOUNTER — Encounter (HOSPITAL_COMMUNITY): Payer: No Typology Code available for payment source

## 2012-11-06 ENCOUNTER — Encounter (HOSPITAL_COMMUNITY): Payer: No Typology Code available for payment source

## 2012-11-09 ENCOUNTER — Encounter (HOSPITAL_COMMUNITY)
Admission: RE | Admit: 2012-11-09 | Discharge: 2012-11-09 | Disposition: A | Discharge status: Home / Self Care | Payer: No Typology Code available for payment source | Source: Ambulatory Visit | Attending: Cardiology | Admitting: Cardiology

## 2012-11-11 ENCOUNTER — Encounter (HOSPITAL_COMMUNITY): Payer: No Typology Code available for payment source

## 2012-11-13 ENCOUNTER — Encounter (HOSPITAL_COMMUNITY): Admission: RE | Admit: 2012-11-13 | Payer: No Typology Code available for payment source | Source: Ambulatory Visit

## 2012-11-16 ENCOUNTER — Encounter (HOSPITAL_COMMUNITY)
Admission: RE | Admit: 2012-11-16 | Discharge: 2012-11-16 | Disposition: A | Discharge status: Home / Self Care | Payer: No Typology Code available for payment source | Source: Ambulatory Visit | Attending: Cardiology | Admitting: Cardiology

## 2012-11-18 ENCOUNTER — Encounter (HOSPITAL_COMMUNITY)
Admission: RE | Admit: 2012-11-18 | Discharge: 2012-11-18 | Disposition: A | Discharge status: Home / Self Care | Payer: No Typology Code available for payment source | Source: Ambulatory Visit | Attending: Cardiology | Admitting: Cardiology

## 2012-11-30 ENCOUNTER — Encounter (HOSPITAL_COMMUNITY)
Admission: RE | Admit: 2012-11-30 | Discharge: 2012-11-30 | Disposition: A | Discharge status: Home / Self Care | Payer: No Typology Code available for payment source | Source: Ambulatory Visit | Attending: Cardiology | Admitting: Cardiology

## 2012-11-30 DIAGNOSIS — I200 Unstable angina: Secondary | ICD-10-CM | POA: Insufficient documentation

## 2012-11-30 DIAGNOSIS — Z5189 Encounter for other specified aftercare: Secondary | ICD-10-CM | POA: Insufficient documentation

## 2012-11-30 DIAGNOSIS — E782 Mixed hyperlipidemia: Secondary | ICD-10-CM | POA: Insufficient documentation

## 2012-11-30 DIAGNOSIS — I10 Essential (primary) hypertension: Secondary | ICD-10-CM | POA: Insufficient documentation

## 2012-11-30 DIAGNOSIS — I2510 Atherosclerotic heart disease of native coronary artery without angina pectoris: Secondary | ICD-10-CM | POA: Insufficient documentation

## 2012-12-02 ENCOUNTER — Encounter (HOSPITAL_COMMUNITY)
Admission: RE | Admit: 2012-12-02 | Discharge: 2012-12-02 | Disposition: A | Discharge status: Home / Self Care | Payer: No Typology Code available for payment source | Source: Ambulatory Visit | Attending: Cardiology | Admitting: Cardiology

## 2012-12-04 ENCOUNTER — Encounter (HOSPITAL_COMMUNITY)
Admission: RE | Admit: 2012-12-04 | Discharge: 2012-12-04 | Disposition: A | Discharge status: Home / Self Care | Payer: No Typology Code available for payment source | Source: Ambulatory Visit | Attending: Cardiology | Admitting: Cardiology

## 2012-12-07 ENCOUNTER — Encounter (HOSPITAL_COMMUNITY)
Admission: RE | Admit: 2012-12-07 | Discharge: 2012-12-07 | Disposition: A | Discharge status: Home / Self Care | Payer: No Typology Code available for payment source | Source: Ambulatory Visit | Attending: Cardiology | Admitting: Cardiology

## 2012-12-09 ENCOUNTER — Encounter (HOSPITAL_COMMUNITY)
Admission: RE | Admit: 2012-12-09 | Discharge: 2012-12-09 | Disposition: A | Discharge status: Home / Self Care | Payer: No Typology Code available for payment source | Source: Ambulatory Visit | Attending: Cardiology | Admitting: Cardiology

## 2012-12-09 NOTE — Progress Notes (Signed)
[employer] today. Doug plans to continue exercise in the maintenance program.

## 2012-12-22 ENCOUNTER — Encounter (HOSPITAL_COMMUNITY)
Admission: RE | Admit: 2012-12-22 | Discharge: 2012-12-22 | Disposition: A | Discharge status: Home / Self Care | Payer: Self-pay | Source: Ambulatory Visit | Attending: Cardiology | Admitting: Cardiology

## 2012-12-22 DIAGNOSIS — I10 Essential (primary) hypertension: Secondary | ICD-10-CM | POA: Insufficient documentation

## 2012-12-22 DIAGNOSIS — Z5189 Encounter for other specified aftercare: Secondary | ICD-10-CM | POA: Insufficient documentation

## 2012-12-22 DIAGNOSIS — I2510 Atherosclerotic heart disease of native coronary artery without angina pectoris: Secondary | ICD-10-CM | POA: Insufficient documentation

## 2012-12-22 DIAGNOSIS — I200 Unstable angina: Secondary | ICD-10-CM | POA: Insufficient documentation

## 2012-12-22 DIAGNOSIS — E782 Mixed hyperlipidemia: Secondary | ICD-10-CM | POA: Insufficient documentation

## 2012-12-22 NOTE — Progress Notes (Signed)
Oriented patient to the cardiac rehab maintenance program. Consent reviewed and signed.

## 2012-12-24 ENCOUNTER — Encounter (HOSPITAL_COMMUNITY)
Admission: RE | Admit: 2012-12-24 | Discharge: 2012-12-24 | Disposition: A | Discharge status: Home / Self Care | Payer: Self-pay | Source: Ambulatory Visit | Attending: Cardiology | Admitting: Cardiology

## 2012-12-25 ENCOUNTER — Encounter (HOSPITAL_COMMUNITY)
Admission: RE | Admit: 2012-12-25 | Discharge: 2012-12-25 | Disposition: A | Discharge status: Home / Self Care | Payer: Self-pay | Source: Ambulatory Visit | Attending: Cardiology | Admitting: Cardiology

## 2012-12-26 DIAGNOSIS — J189 Pneumonia, unspecified organism: Secondary | ICD-10-CM | POA: Insufficient documentation

## 2012-12-26 DIAGNOSIS — H6120 Impacted cerumen, unspecified ear: Secondary | ICD-10-CM | POA: Insufficient documentation

## 2012-12-26 DIAGNOSIS — R040 Epistaxis: Secondary | ICD-10-CM | POA: Insufficient documentation

## 2012-12-26 DIAGNOSIS — J309 Allergic rhinitis, unspecified: Secondary | ICD-10-CM | POA: Insufficient documentation

## 2012-12-26 DIAGNOSIS — F411 Generalized anxiety disorder: Secondary | ICD-10-CM | POA: Insufficient documentation

## 2012-12-26 DIAGNOSIS — B349 Viral infection, unspecified: Secondary | ICD-10-CM | POA: Insufficient documentation

## 2012-12-26 DIAGNOSIS — J209 Acute bronchitis, unspecified: Secondary | ICD-10-CM | POA: Insufficient documentation

## 2012-12-26 DIAGNOSIS — R42 Dizziness and giddiness: Secondary | ICD-10-CM | POA: Insufficient documentation

## 2012-12-26 DIAGNOSIS — H1130 Conjunctival hemorrhage, unspecified eye: Secondary | ICD-10-CM | POA: Insufficient documentation

## 2012-12-26 DIAGNOSIS — I10 Essential (primary) hypertension: Secondary | ICD-10-CM | POA: Insufficient documentation

## 2012-12-26 DIAGNOSIS — J45909 Unspecified asthma, uncomplicated: Secondary | ICD-10-CM | POA: Insufficient documentation

## 2012-12-26 DIAGNOSIS — R55 Syncope and collapse: Secondary | ICD-10-CM | POA: Insufficient documentation

## 2012-12-29 ENCOUNTER — Encounter (HOSPITAL_COMMUNITY)
Admission: RE | Admit: 2012-12-29 | Discharge: 2012-12-29 | Disposition: A | Discharge status: Home / Self Care | Payer: Self-pay | Source: Ambulatory Visit | Attending: Cardiology | Admitting: Cardiology

## 2012-12-30 ENCOUNTER — Encounter (HOSPITAL_COMMUNITY)
Admission: RE | Admit: 2012-12-30 | Discharge: 2012-12-30 | Disposition: A | Discharge status: Home / Self Care | Payer: Self-pay | Source: Ambulatory Visit | Attending: Cardiology | Admitting: Cardiology

## 2012-12-31 ENCOUNTER — Encounter (HOSPITAL_COMMUNITY): Payer: Self-pay

## 2013-01-01 ENCOUNTER — Encounter (HOSPITAL_COMMUNITY): Payer: Self-pay

## 2013-01-05 ENCOUNTER — Encounter (HOSPITAL_COMMUNITY): Payer: Self-pay

## 2013-01-07 ENCOUNTER — Encounter (HOSPITAL_COMMUNITY): Payer: Self-pay

## 2013-01-08 ENCOUNTER — Encounter (HOSPITAL_COMMUNITY): Payer: Self-pay

## 2013-01-12 ENCOUNTER — Encounter (HOSPITAL_COMMUNITY): Payer: Self-pay

## 2013-01-14 ENCOUNTER — Encounter (HOSPITAL_COMMUNITY): Payer: Self-pay

## 2013-01-15 ENCOUNTER — Encounter (HOSPITAL_COMMUNITY): Payer: Self-pay

## 2013-01-19 ENCOUNTER — Encounter (HOSPITAL_COMMUNITY): Payer: Self-pay | Attending: Cardiology

## 2013-01-19 DIAGNOSIS — I2510 Atherosclerotic heart disease of native coronary artery without angina pectoris: Secondary | ICD-10-CM | POA: Insufficient documentation

## 2013-01-19 DIAGNOSIS — E782 Mixed hyperlipidemia: Secondary | ICD-10-CM | POA: Insufficient documentation

## 2013-01-19 DIAGNOSIS — I10 Essential (primary) hypertension: Secondary | ICD-10-CM | POA: Insufficient documentation

## 2013-01-19 DIAGNOSIS — I200 Unstable angina: Secondary | ICD-10-CM | POA: Insufficient documentation

## 2013-01-19 DIAGNOSIS — Z5189 Encounter for other specified aftercare: Secondary | ICD-10-CM | POA: Insufficient documentation

## 2013-01-21 ENCOUNTER — Encounter (HOSPITAL_COMMUNITY): Payer: Self-pay

## 2013-01-26 ENCOUNTER — Encounter (HOSPITAL_COMMUNITY): Payer: Self-pay

## 2013-01-28 ENCOUNTER — Encounter (HOSPITAL_COMMUNITY): Payer: Self-pay

## 2013-01-29 ENCOUNTER — Encounter (HOSPITAL_COMMUNITY): Payer: Self-pay

## 2013-02-02 ENCOUNTER — Encounter (HOSPITAL_COMMUNITY): Payer: Self-pay

## 2013-02-04 ENCOUNTER — Encounter (HOSPITAL_COMMUNITY): Payer: Self-pay

## 2013-02-05 ENCOUNTER — Encounter (HOSPITAL_COMMUNITY): Payer: Self-pay

## 2013-02-05 ENCOUNTER — Encounter: Payer: Self-pay | Admitting: Cardiology

## 2013-02-08 ENCOUNTER — Telehealth (HOSPITAL_COMMUNITY): Payer: Self-pay | Admitting: *Deleted

## 2013-02-09 ENCOUNTER — Encounter (HOSPITAL_COMMUNITY): Payer: Self-pay

## 2013-02-11 ENCOUNTER — Encounter (HOSPITAL_COMMUNITY): Payer: Self-pay

## 2013-02-12 ENCOUNTER — Encounter (HOSPITAL_COMMUNITY): Payer: Self-pay

## 2013-02-16 ENCOUNTER — Encounter (HOSPITAL_COMMUNITY): Payer: Self-pay

## 2013-02-18 ENCOUNTER — Encounter (HOSPITAL_COMMUNITY): Payer: Self-pay

## 2013-02-19 ENCOUNTER — Encounter (HOSPITAL_COMMUNITY): Payer: No Typology Code available for payment source

## 2013-02-23 ENCOUNTER — Encounter (HOSPITAL_COMMUNITY): Payer: No Typology Code available for payment source

## 2013-02-24 DIAGNOSIS — H6980 Other specified disorders of Eustachian tube, unspecified ear: Secondary | ICD-10-CM | POA: Insufficient documentation

## 2013-02-24 DIAGNOSIS — H6990 Unspecified Eustachian tube disorder, unspecified ear: Secondary | ICD-10-CM | POA: Insufficient documentation

## 2013-02-24 DIAGNOSIS — H8309 Labyrinthitis, unspecified ear: Secondary | ICD-10-CM | POA: Insufficient documentation

## 2013-02-25 ENCOUNTER — Encounter (HOSPITAL_COMMUNITY): Payer: No Typology Code available for payment source

## 2013-02-26 ENCOUNTER — Encounter (HOSPITAL_COMMUNITY): Payer: No Typology Code available for payment source

## 2013-03-01 ENCOUNTER — Encounter (HOSPITAL_BASED_OUTPATIENT_CLINIC_OR_DEPARTMENT_OTHER): Payer: Self-pay | Admitting: *Deleted

## 2013-03-01 ENCOUNTER — Other Ambulatory Visit: Payer: Self-pay

## 2013-03-01 ENCOUNTER — Emergency Department (HOSPITAL_BASED_OUTPATIENT_CLINIC_OR_DEPARTMENT_OTHER): Payer: No Typology Code available for payment source

## 2013-03-01 ENCOUNTER — Observation Stay (HOSPITAL_BASED_OUTPATIENT_CLINIC_OR_DEPARTMENT_OTHER)
Admission: EM | Admit: 2013-03-01 | Discharge: 2013-03-02 | Disposition: A | Discharge status: Home / Self Care | Payer: No Typology Code available for payment source | Attending: Cardiology | Admitting: Cardiology

## 2013-03-01 DIAGNOSIS — Z79899 Other long term (current) drug therapy: Secondary | ICD-10-CM | POA: Insufficient documentation

## 2013-03-01 DIAGNOSIS — E782 Mixed hyperlipidemia: Secondary | ICD-10-CM | POA: Insufficient documentation

## 2013-03-01 DIAGNOSIS — Z951 Presence of aortocoronary bypass graft: Secondary | ICD-10-CM | POA: Insufficient documentation

## 2013-03-01 DIAGNOSIS — R5381 Other malaise: Secondary | ICD-10-CM | POA: Insufficient documentation

## 2013-03-01 DIAGNOSIS — R5383 Other fatigue: Secondary | ICD-10-CM

## 2013-03-01 DIAGNOSIS — R079 Chest pain, unspecified: Secondary | ICD-10-CM

## 2013-03-01 DIAGNOSIS — I2510 Atherosclerotic heart disease of native coronary artery without angina pectoris: Secondary | ICD-10-CM | POA: Insufficient documentation

## 2013-03-01 DIAGNOSIS — R072 Precordial pain: Principal | ICD-10-CM | POA: Insufficient documentation

## 2013-03-01 DIAGNOSIS — E876 Hypokalemia: Secondary | ICD-10-CM | POA: Insufficient documentation

## 2013-03-01 DIAGNOSIS — IMO0002 Reserved for concepts with insufficient information to code with codable children: Secondary | ICD-10-CM | POA: Insufficient documentation

## 2013-03-01 DIAGNOSIS — I10 Essential (primary) hypertension: Secondary | ICD-10-CM | POA: Insufficient documentation

## 2013-03-01 LAB — URINALYSIS, ROUTINE W REFLEX MICROSCOPIC
Glucose, UA: NEGATIVE mg/dL
Hgb urine dipstick: NEGATIVE
Ketones, ur: NEGATIVE mg/dL
Leukocytes, UA: NEGATIVE
Protein, ur: NEGATIVE mg/dL
Specific Gravity, Urine: 1.01 (ref 1.005–1.030)
Urobilinogen, UA: 0.2 mg/dL (ref 0.0–1.0)
pH: 6 (ref 5.0–8.0)

## 2013-03-01 LAB — COMPREHENSIVE METABOLIC PANEL
BUN: 16 mg/dL (ref 6–23)
CO2: 29 mEq/L (ref 19–32)
Calcium: 10 mg/dL (ref 8.4–10.5)
Chloride: 100 mEq/L (ref 96–112)
Creatinine, Ser: 1 mg/dL (ref 0.50–1.35)
GFR calc Af Amer: >90 mL/min (ref >90)
GFR calc non Af Amer: 80 mL/min — ABNORMAL LOW (ref >90)
Total Bilirubin: 0.4 mg/dL (ref 0.3–1.2)

## 2013-03-01 LAB — CBC
HCT: 49.4 % (ref 39.0–52.0)
Hemoglobin: 17.3 g/dL — ABNORMAL HIGH (ref 13.0–17.0)
MCH: 29.7 pg (ref 26.0–34.0)
RBC: 5.83 MIL/uL — ABNORMAL HIGH (ref 4.22–5.81)

## 2013-03-01 MED ORDER — ACETAMINOPHEN 325 MG PO TABS: 650.0000 mg | ORAL_TABLET | ORAL | Status: DC | PRN

## 2013-03-01 MED ORDER — COENZYME Q10 200 MG PO CAPS: 200.0000 mg | ORAL_CAPSULE | Freq: Every morning | ORAL | Status: DC

## 2013-03-01 MED ORDER — ASPIRIN EC 81 MG PO TBEC
81.0000 mg | DELAYED_RELEASE_TABLET | Freq: Every day | ORAL | Status: DC
Start: 2013-03-02 — End: 2013-03-02
  Administered 2013-03-02: 81 mg via ORAL
  Filled 2013-03-01: qty 1

## 2013-03-01 MED ORDER — LISINOPRIL 10 MG PO TABS
10.0000 mg | ORAL_TABLET | Freq: Every morning | ORAL | Status: DC
  Administered 2013-03-02: 10 mg via ORAL
  Filled 2013-03-01: qty 1

## 2013-03-01 MED ORDER — METOPROLOL TARTRATE 50 MG PO TABS
50.0000 mg | ORAL_TABLET | Freq: Two times a day (BID) | ORAL | Status: DC
  Administered 2013-03-01 – 2013-03-02 (×2): 50 mg via ORAL
  Filled 2013-03-01 (×3): qty 1

## 2013-03-01 MED ORDER — ATORVASTATIN CALCIUM 40 MG PO TABS
40.0000 mg | ORAL_TABLET | Freq: Every day | ORAL | Status: DC
  Administered 2013-03-01: 40 mg via ORAL
  Filled 2013-03-01 (×2): qty 1

## 2013-03-01 MED ORDER — PANTOPRAZOLE SODIUM 40 MG PO TBEC
40.0000 mg | DELAYED_RELEASE_TABLET | Freq: Every day | ORAL | Status: DC
  Administered 2013-03-01 – 2013-03-02 (×2): 40 mg via ORAL
  Filled 2013-03-01: qty 1

## 2013-03-01 MED ORDER — NITROGLYCERIN 0.4 MG SL SUBL: 0.4000 mg | SUBLINGUAL_TABLET | SUBLINGUAL | Status: DC | PRN

## 2013-03-01 MED ORDER — LORATADINE 10 MG PO TABS
10.0000 mg | ORAL_TABLET | Freq: Every day | ORAL | Status: DC
  Administered 2013-03-02: 10 mg via ORAL
  Filled 2013-03-01: qty 1

## 2013-03-01 MED ORDER — NITROGLYCERIN 0.4 MG SL SUBL
0.4000 mg | SUBLINGUAL_TABLET | SUBLINGUAL | Status: DC | PRN
  Filled 2013-03-01: qty 25

## 2013-03-01 MED ORDER — POTASSIUM CHLORIDE CRYS ER 20 MEQ PO TBCR
20.0000 meq | EXTENDED_RELEASE_TABLET | Freq: Once | ORAL | Status: AC
  Administered 2013-03-01: 20 meq via ORAL
  Filled 2013-03-01: qty 1

## 2013-03-01 MED ORDER — VITAMIN C 500 MG PO TABS
500.0000 mg | ORAL_TABLET | Freq: Every morning | ORAL | Status: DC
  Administered 2013-03-02: 500 mg via ORAL
  Filled 2013-03-01: qty 1

## 2013-03-01 MED ORDER — ASPIRIN 81 MG PO CHEW
324.0000 mg | CHEWABLE_TABLET | Freq: Once | ORAL | Status: AC
  Administered 2013-03-01: 243 mg via ORAL
  Filled 2013-03-01: qty 3

## 2013-03-01 MED ORDER — ONDANSETRON HCL 4 MG/2ML IJ SOLN: 4.0000 mg | Freq: Four times a day (QID) | INTRAMUSCULAR | Status: DC | PRN

## 2013-03-01 NOTE — Progress Notes (Addendum)
PHARMACIST - PHYSICIAN ORDER COMMUNICATION  CONCERNING: P&T Medication Policy on Herbal Medications  DESCRIPTION:  This patient's order for:  Coenzyme Q10  has been noted.  This product(s) is classified as an "herbal" or natural product. Due to a lack of definitive safety studies or FDA approval, nonstandard manufacturing practices, plus the potential risk of unknown drug-drug interactions while on inpatient medications, the Pharmacy and Therapeutics Committee does not permit the use of "herbal" or natural products of this type within [facility].   ACTION TAKEN: The pharmacy department is unable to verify this order at this time and your patient has been informed of this safety policy. Please reevaluate patient's clinical condition at discharge and address if the herbal or natural product(s) should be resumed at that time.  Shelba Flake Achilles Dunk, PharmD Clinical Pharmacist - Resident Pager: [phone] Pharmacy: [phone] 03/01/2013 5:48 PM

## 2013-03-01 NOTE — ED Notes (Signed)
Patient states that he was driving approx one hour ago and had a sudden onset of chest pressure and weakness that lasted a few minutes and subsided, no chest pain at this time

## 2013-03-01 NOTE — ED Notes (Signed)
Pt c/o chest pressure x 15 mins  Denies SOB nausea  CABG in dec

## 2013-03-01 NOTE — H&P (Addendum)
Physician History and Physical    Patient ID: Terry Hansen MRN: [id number] DOB/AGE: 59-Oct-1957 59 y.o. Admit date: 03/01/2013  Primary Care Physician: Pati Gallo MD- [place] Primary Cardiologist: Charlton Haws MD  HPI: Mr. Skibicki is transferred from med center [facility] for evaluation of chest pain. He is a 57 year old white male with history of coronary disease. He presented in December of 2013 with chest pain and cardiac catheterization at that time demonstrated a high-grade stenosis in the proximal LAD and an occluded right coronary. He underwent coronary bypass surgery. Since then he has done well from a cardiac standpoint. He has completed cardiac rehabilitation. He states over the past week he has not felt well. He feels weak and dizzy. He has had problems with his ears stopping up. He has felt tired and lethargic. He saw his primary care who placed him on a steroid Dosepak. Initially his ear seemed to clear up but then they stopped up again. He completed his prednisone last night. This morning while driving to work he developed onset of acute mid substernal chest pain. He describes this as a pressure. There was no radiation. He states that he just went limp and felt very weak. All together his pain lasted about 2 minutes. He denies any fever or chills. He has no shortness of breath or cough. No nausea vomiting. He denies dysuria.  Review of systems complete and found to be negative unless listed above  Past Medical History  Diagnosis Date  . Hypertension   . Ocular migraine   . CAD (coronary artery disease)     a.  LHC 06/26/12:  pLAD 90%, prox/mid/dist CFS 20-30%, oRCA 100% (non dom), EF 55%;  b. Echo 12/13:  EF 55-60%, Gr 1 diast dysfn;  c. s/p CABG Dorris Fetch) 12/13: L-LAD/Dx    . HLD (hyperlipidemia)     Family History  Problem Relation Age of Onset  . Heart disease Brother     CABG age 78, redo bypass ~50  . Heart disease Paternal Grandmother   . Heart disease Cousin    Maternal side  . Heart attack Brother     History   Social History  . Marital Status: Legally Separated    Spouse Name: N/A    Number of Children: N/A  . Years of Education: N/A   Occupational History  . Not on file.   Social History Main Topics  . Smoking status: Never Smoker   . Smokeless tobacco: Not on file  . Alcohol Use: Yes     Comment: 1 drink/month  . Drug Use: No  . Sexually Active: Not on file   Other Topics Concern  . Not on file   Social History Narrative  . No narrative on file    Past Surgical History  Procedure Laterality Date  . Appendectomy    . Nasal septum surgery      For uncontrolled epistaxis early 2013  . Coronary artery bypass graft  06/29/2012    Procedure: CORONARY ARTERY BYPASS GRAFTING (CABG);  Surgeon: Loreli Slot, MD;  Location: [facility] OR;  Service: Open Heart Surgery;  Laterality: N/A;  times two using Left Internal Mammary Artery     Prescriptions prior to admission  Medication Sig Dispense Refill  . aspirin EC 81 MG tablet Take 1 tablet (81 mg total) by mouth daily.      Marland Kitchen atorvastatin (LIPITOR) 40 MG tablet Take 1 tablet (40 mg total) by mouth daily at 6 PM.  30 tablet  11  . Coenzyme Q10 200 MG capsule Take 200 mg by mouth every morning. disontinue while in hospital      . ibuprofen (ADVIL,MOTRIN) 200 MG tablet Take 600 mg by mouth every 6 (six) hours as needed. migraine headache      . lisinopril (PRINIVIL,ZESTRIL) 10 MG tablet Take 10 mg by mouth every morning.      . metoprolol (LOPRESSOR) 50 MG tablet Take 1 tablet (50 mg total) by mouth 2 (two) times daily.  60 tablet  11  . vitamin C (ASCORBIC ACID) 500 MG tablet Take 500 mg by mouth every morning.       . [DISCONTINUED] lisinopril (PRINIVIL,ZESTRIL) 10 MG tablet Take 1 tablet (10 mg total) by mouth daily.  30 tablet  11  . nitroGLYCERIN (NITROSTAT) 0.4 MG SL tablet Place 1 tablet (0.4 mg total) under the tongue every 5 (five) minutes as needed for chest pain.  25 tablet  3    . predniSONE (DELTASONE) 10 MG tablet Take 10 mg by mouth daily.        Physical Exam: Blood pressure 150/81, pulse 67, temperature 97.5 F (36.4 C), temperature source Oral, resp. rate 18, height 5\' 11"  (1.803 m), weight 181 lb (82.101 kg), SpO2 98.00%.  He is a pleasant, middle-aged white male in no acute distress. HEENT: Normocephalic, atraumatic. Pupils equal round and reactive to light accommodation. Extraocular movements are full. There is no nystagmus. Oropharynx is clear. Dentition is in good repair. Ear canals are clear bilaterally. TMs are clear bilaterally with good light reflexes. Neck: No adenopathy, thyromegaly, JVD, or bruits. Lungs: Clear Cardiovascular: Regular rate and rhythm. Normal S1 and S2. No gallop, murmur, or click. PMI is normal. Chest: Median sternotomy scar is well healed. Abdomen: Soft and nontender. No masses or hepatosplenomegaly. Bowel sounds are positive. Extremities: Femoral and pedal pulses are 2+. He has no edema or cyanosis. No phlebitis. Skin: Warm and dry Neuro: Alert and oriented x3. Cranial nerves II through XII are intact.   Labs:   Lab Results  Component Value Date   WBC 13.1* 03/01/2013   HGB 17.3* 03/01/2013   HCT 49.4 03/01/2013   MCV 84.7 03/01/2013   PLT 263 03/01/2013    Recent Labs Lab 03/01/13 1320  NA 140  K 3.0*  CL 100  CO2 29  BUN 16  CREATININE 1.00  CALCIUM 10.0  PROT 7.1  BILITOT 0.4  ALKPHOS 134*  ALT 29  AST 14  GLUCOSE 99    Troponin less than 0.3   Radiology:PORTABLE CHEST - 1 VIEW  Comparison: 08/12/2012.  Findings: Cardiopericardial silhouette appears within normal  limits. Monitoring leads are projected over the chest. Mediastinal  contours normal. No airspace disease. No effusion.  IMPRESSION:  Median sternotomy. No acute cardiopulmonary disease.  Original Report Authenticated By: Andreas Newport, M.D  EKG: [facility] from [facility] is not available to review. Physician note states that it shows  an incomplete right bundle branch block with nonspecific T-wave abnormality. Prior ECG in March 2014 also showed an incomplete right bundle branch block.  ASSESSMENT AND PLAN:  1. Atypical chest pain. Symptoms only lasted 2 minutes and resolved. I doubt a cardiac etiology. Will observe on telemetry overnight and cycle cardiac enzymes. We'll repeat ECG now and in the a.m. 2. Coronary disease status post CABG. 3. Weakness and fatigue. We'll check urinalysis. We'll check TSH. Mild white count elevation is consistent with steroid effect. We will replete his potassium. 4. Hypertension-continue metoprolol and  lisinopril 5. Hyperlipidemia-on chronic steroids. We will check CPK given his weakness. 6. Eustachian tube abnormality. Exam today is normal. Recent course of steroids. Will start antihistamine/ nonsedating.  SignedTheron Arista [facility] 03/01/2013, 5:09 PM

## 2013-03-01 NOTE — ED Provider Notes (Signed)
CSN: [id number]     Arrival date & time 03/01/13  1259 History     First MD Initiated Contact with Patient 03/01/13 1312     Chief Complaint  Patient presents with  . Chest Pain    HPI Patient with coronary artery bypass graft in December presents with 2 to three-day history of weakness and not feeling well followed by chest discomfort and pressure which started 15 minutes prior to arrival.  Patient denies shortness of breath nausea vomiting or diaphoresis. Past Medical History  Diagnosis Date  . Hypertension   . Ocular migraine   . CAD (coronary artery disease)     a.  LHC 06/26/12:  pLAD 90%, prox/mid/dist CFS 20-30%, oRCA 100% (non dom), EF 55%;  b. Echo 12/13:  EF 55-60%, Gr 1 diast dysfn;  c. s/p CABG Dorris Fetch) 12/13: L-LAD/Dx    . HLD (hyperlipidemia)    Past Surgical History  Procedure Laterality Date  . Appendectomy    . Nasal septum surgery      For uncontrolled epistaxis early 2013  . Coronary artery bypass graft  06/29/2012    Procedure: CORONARY ARTERY BYPASS GRAFTING (CABG);  Surgeon: Loreli Slot, MD;  Location[facility] OR;  Service: Open Heart Surgery;  Laterality: N/A;  times two using Left Internal Mammary Artery   Family History  Problem Relation Age of Onset  . Heart disease Brother     CABG age 48, redo bypass ~50  . Heart disease Paternal Grandmother   . Heart disease Cousin     Maternal side  . Heart attack Brother    History  Substance Use Topics  . Smoking status: Never Smoker   . Smokeless tobacco: Not on file  . Alcohol Use: Yes     Comment: 1 drink/month    Review of Systems All other systems reviewed and are negative Allergies  Review of patient's allergies indicates no known allergies.  Home Medications   Current Outpatient Rx  Name  Route  Sig  Dispense  Refill  . predniSONE (DELTASONE) 10 MG tablet   Oral   Take 10 mg by mouth daily.         Marland Kitchen aspirin EC 81 MG tablet   Oral   Take 1 tablet (81 mg total) by mouth  daily.         Marland Kitchen atorvastatin (LIPITOR) 40 MG tablet   Oral   Take 1 tablet (40 mg total) by mouth daily at 6 PM.   30 tablet   11   . Coenzyme Q10 200 MG capsule   Oral   Take 200 mg by mouth daily. disontinue while in hospital         . ibuprofen (ADVIL,MOTRIN) 200 MG tablet   Oral   Take 600 mg by mouth every 6 (six) hours as needed. migraine headache         . lisinopril (PRINIVIL,ZESTRIL) 10 MG tablet   Oral   Take 1 tablet (10 mg total) by mouth daily.   30 tablet   11   . metoprolol (LOPRESSOR) 50 MG tablet   Oral   Take 1 tablet (50 mg total) by mouth 2 (two) times daily.   60 tablet   11   . nitroGLYCERIN (NITROSTAT) 0.4 MG SL tablet   Sublingual   Place 1 tablet (0.4 mg total) under the tongue every 5 (five) minutes as needed for chest pain.   25 tablet   3   . vitamin C (ASCORBIC  ACID) 500 MG tablet   Oral   Take 500 mg by mouth daily.          BP 130/76  Pulse 61  Temp(Src) 98.1 F (36.7 C) (Oral)  Resp 20  Ht 5\' 11"  (1.803 m)  Wt 181 lb (82.101 kg)  BMI 25.26 kg/m2  SpO2 98% Physical Exam  Nursing note and vitals reviewed. Constitutional: He is oriented to person, place, and time. He appears well-developed and well-nourished. No distress.  HENT:  Head: Normocephalic and atraumatic.  Eyes: Pupils are equal, round, and reactive to light.  Neck: Normal range of motion.  Cardiovascular: Normal rate, regular rhythm and intact distal pulses.   Pulmonary/Chest: No respiratory distress.  Abdominal: Normal appearance. He exhibits no distension. There is tenderness. There is rebound.  Musculoskeletal: Normal range of motion.  Neurological: He is alert and oriented to person, place, and time. No cranial nerve deficit.  Skin: Skin is warm and dry. No rash noted.  Psychiatric: He has a normal mood and affect. His behavior is normal.    ED Course   Procedures (including critical care time)  Date: 03/01/2013  Rate: 59  Rhythm: normal sinus  rhythm  QRS Axis: normal  Intervals: normal  ST/T Wave abnormalities: Nonspecific T wave abnormalities  Conduction Disutrbances: Incomplete right bundle-branch block  Narrative Interpretation: Abnormal EKG     Labs Reviewed  CBC - Abnormal; Notable for the following:    WBC 13.1 (*)    RBC 5.83 (*)    Hemoglobin 17.3 (*)    All other components within normal limits  COMPREHENSIVE METABOLIC PANEL - Abnormal; Notable for the following:    Potassium 3.0 (*)    Alkaline Phosphatase 134 (*)    GFR calc non Af Amer 80 (*)    All other components within normal limits  TROPONIN I   Dg Chest Portable 1 View  03/01/2013   *RADIOLOGY REPORT*  Clinical Data: CABG.  Chest pressure.  History of hypertension.  PORTABLE CHEST - 1 VIEW  Comparison: 08/12/2012.  Findings: Cardiopericardial silhouette appears within normal limits. Monitoring leads are projected over the chest.  Mediastinal contours normal.  No airspace disease.  No effusion.  IMPRESSION: Median sternotomy.  No acute cardiopulmonary disease.   Original Report Authenticated By: Andreas Newport, M.D.   1. Chest pain     MDM  Discussed with Dr. Antoine Poche, [facility] cards, who accepts patient for transfer.  Nelia Shi, MD 03/01/13 [phone]

## 2013-03-02 ENCOUNTER — Encounter (HOSPITAL_COMMUNITY): Payer: Self-pay | Admitting: General Practice

## 2013-03-02 ENCOUNTER — Encounter (HOSPITAL_COMMUNITY): Payer: No Typology Code available for payment source

## 2013-03-02 LAB — BASIC METABOLIC PANEL
BUN: 17 mg/dL (ref 6–23)
Creatinine, Ser: 0.91 mg/dL (ref 0.50–1.35)
GFR calc Af Amer: >90 mL/min (ref >90)
GFR calc non Af Amer: >90 mL/min (ref >90)
Glucose, Bld: 81 mg/dL (ref 70–99)

## 2013-03-02 LAB — CBC WITH DIFFERENTIAL/PLATELET
Basophils Absolute: 0 10*3/uL (ref 0.0–0.1)
Basophils Relative: 0 % (ref 0–1)
Eosinophils Absolute: 0.2 10*3/uL (ref 0.0–0.7)
HCT: 46.5 % (ref 39.0–52.0)
Hemoglobin: 16.6 g/dL (ref 13.0–17.0)
MCH: 30.2 pg (ref 26.0–34.0)
MCHC: 35.7 g/dL (ref 30.0–36.0)
Monocytes Absolute: 0.8 10*3/uL (ref 0.1–1.0)
Monocytes Relative: 9 % (ref 3–12)
Neutro Abs: 4.4 10*3/uL (ref 1.7–7.7)
Neutrophils Relative %: 47 % (ref 43–77)
RDW: 12.9 % (ref 11.5–15.5)

## 2013-03-02 LAB — TSH: TSH: 0.736 u[IU]/mL (ref 0.350–4.500)

## 2013-03-02 NOTE — Progress Notes (Signed)
Pts assessment unchanged from this am. D/c'd to private vehicle in stable condition

## 2013-03-02 NOTE — Discharge Summary (Signed)
CARDIOLOGY DISCHARGE SUMMARY   Patient ID: Terry Hansen MRN: [id number] DOB/AGE: August 13, 1955 57 y.o.  Admit date: 03/01/2013 Discharge date: 03/02/2013  Primary Discharge Diagnosis:  Precordial pain Secondary Discharge Diagnosis:    Fatigue   Hypokalemia   Mixed hyperlipidemia   CAD (coronary artery disease)   HTN (hypertension)  Procedures:  CXR  Hospital Course: Terry Hansen is a 57 y.o. male with a history of CAD. He had an episode of chest pain associated with weakness. He also had been having fatigue and possible eustachian tube abnormality (?congestion). He went to [facility] and was transferred to [facility] where he was admitted for further evaluation.  His cardiac enzymes were negative for MI. His CXR did not show any acute abnormality. His potassium was low and was supplemented. He was monitored closely for symptoms and stayed pain-free. His ECG had no acute changes.  On 03/02/2013, Terry Hansen was seen by Dr. Eden Emms. He may have had some side effects or other reaction to prednisone. The prednisone may also have been responsible for his hypokalemia. He felt better once his potassium was supplemented. Dr. Eden Emms evaluated Terry Hansen and considered him stable for discharge, to follow up as an outpatient.  Labs:   Lab Results  Component Value Date   WBC 9.4 03/02/2013   HGB 16.6 03/02/2013   HCT 46.5 03/02/2013   MCV 84.5 03/02/2013   PLT 233 03/02/2013     Recent Labs Lab 03/01/13 1320 03/02/13 0410  NA 140 140  K 3.0* 3.8  CL 100 104  CO2 29 26  BUN 16 17  CREATININE 1.00 0.91  CALCIUM 10.0 8.9  PROT 7.1  --   BILITOT 0.4  --   ALKPHOS 134*  --   ALT 29  --   AST 14  --   GLUCOSE 99 81    Recent Labs  03/01/13 1320 03/01/13 1748  CKTOTAL  --  39  TROPONINI <0.30  --    Radiology: Dg Chest Portable 1 View 03/01/2013   *RADIOLOGY REPORT*  Clinical Data: CABG.  Chest pressure.  History of hypertension.  PORTABLE CHEST - 1 VIEW  Comparison:  08/12/2012.  Findings: Cardiopericardial silhouette appears within normal limits. Monitoring leads are projected over the chest.  Mediastinal contours normal.  No airspace disease.  No effusion.  IMPRESSION: Median sternotomy.  No acute cardiopulmonary disease.   Original Report Authenticated By: Andreas Newport, M.D.   EKG:  03/02/2013 Sinus brady Vent. rate 55 BPM PR interval 122 ms QRS duration 100 ms QT/QTc 420/401 ms P-R-T axes 44 45 64  FOLLOW UP PLANS AND APPOINTMENTS No Known Allergies   Medication List    STOP taking these medications       predniSONE 10 MG tablet  Commonly known as:  DELTASONE      TAKE these medications       aspirin EC 81 MG tablet  Take 1 tablet (81 mg total) by mouth daily.     atorvastatin 40 MG tablet  Commonly known as:  LIPITOR  Take 1 tablet (40 mg total) by mouth daily at 6 PM.     Coenzyme Q10 200 MG capsule  Take 200 mg by mouth every morning. disontinue while in hospital     ibuprofen 200 MG tablet  Commonly known as:  ADVIL,MOTRIN  Take 600 mg by mouth every 6 (six) hours as needed. migraine headache     lisinopril 10 MG tablet  Commonly known as:  PRINIVIL,ZESTRIL  Take 10 mg by mouth every morning.     metoprolol 50 MG tablet  Commonly known as:  LOPRESSOR  Take 1 tablet (50 mg total) by mouth 2 (two) times daily.     nitroGLYCERIN 0.4 MG SL tablet  Commonly known as:  NITROSTAT  Place 1 tablet (0.4 mg total) under the tongue every 5 (five) minutes as needed for chest pain.     vitamin C 500 MG tablet  Commonly known as:  ASCORBIC ACID  Take 500 mg by mouth every morning.        Discharge Orders   Future Appointments Provider Department Dept Phone   04/14/2013 9:30 AM Wendall Stade, MD [facility] Main Office [place]) [phone]   Future Orders Complete By Expires     Diet - low sodium heart healthy  As directed     Increase activity slowly  As directed         BRING ALL MEDICATIONS WITH YOU TO FOLLOW  UP APPOINTMENTS  Time spent with patient to include physician time: 35 min Signed: Theodore Demark, PA-C 03/02/2013, 9:41 AM Co-Sign MD

## 2013-03-02 NOTE — Progress Notes (Signed)
Patient ID: Terry Hansen, male   DOB: 03/09/1956, 57 y.o.   MRN: [id number]    Subjective:  Denies SSCP, palpitations or Dyspnea Thinks he was anxious and had reaction to prednisone  Objective:  Filed Vitals:   03/01/13 1520 03/01/13 1605 03/01/13 2145 03/02/13 0525  BP: 135/89 150/81 103/62 123/64  Pulse: 61 67 62 61  Temp:  97.5 F (36.4 C) 97.9 F (36.6 C) 98 F (36.7 C)  TempSrc:  Oral Oral Oral  Resp: 18 18 18 18   Height:      Weight:      SpO2: 98% 98% 98% 97%    Intake/Output from previous day:  Intake/Output Summary (Last 24 hours) at 03/02/13 0815 Last data filed at 03/01/13 1800  Gross per 24 hour  Intake      0 ml  Output    300 ml  Net   -300 ml    Physical Exam: Affect appropriate Healthy:  appears stated age HEENT: normal Neck supple with no adenopathy JVP normal no bruits no thyromegaly Lungs clear with no wheezing and good diaphragmatic motion Heart:  S1/S2 no murmur, no rub, gallop or click PMI normal Abdomen: benighn, BS positve, no tenderness, no AAA no bruit.  No HSM or HJR Distal pulses intact with no bruits No edema Neuro non-focal Skin warm and dry No muscular weakness   Lab Results: Basic Metabolic Panel:  Recent Labs  29/52/84 1320 03/02/13 0410  NA 140 140  K 3.0* 3.8  CL 100 104  CO2 29 26  GLUCOSE 99 81  BUN 16 17  CREATININE 1.00 0.91  CALCIUM 10.0 8.9   Liver Function Tests:  Recent Labs  03/01/13 1320  AST 14  ALT 29  ALKPHOS 134*  BILITOT 0.4  PROT 7.1  ALBUMIN 4.0    CBC:  Recent Labs  03/01/13 1320 03/02/13 0410  WBC 13.1* 9.4  NEUTROABS  --  4.4  HGB 17.3* 16.6  HCT 49.4 46.5  MCV 84.7 84.5  PLT 263 233   Cardiac Enzymes:  Recent Labs  03/01/13 1320 03/01/13 1748  CKTOTAL  --  39  TROPONINI <0.30  --     Recent Labs  03/01/13 1748  TSH 0.736    Imaging: Dg Chest Portable 1 View  03/01/2013   *RADIOLOGY REPORT*  Clinical Data: CABG.  Chest pressure.  History of  hypertension.  PORTABLE CHEST - 1 VIEW  Comparison: 08/12/2012.  Findings: Cardiopericardial silhouette appears within normal limits. Monitoring leads are projected over the chest.  Mediastinal contours normal.  No airspace disease.  No effusion.  IMPRESSION: Median sternotomy.  No acute cardiopulmonary disease.   Original Report Authenticated By: Andreas Newport, M.D.    Cardiac Studies:  ECG: NSR normal ECG no acute changes   Telemetry:  NSR no arrhythmia  Echo:   Medications:   . aspirin EC  81 mg Oral Daily  . atorvastatin  40 mg Oral q1800  . lisinopril  10 mg Oral q morning - 10a  . loratadine  10 mg Oral Daily  . metoprolol  50 mg Oral BID  . pantoprazole  40 mg Oral Daily  . vitamin C  500 mg Oral q morning - 10a       Assessment/Plan:  Chest Pain:  Atypical likely related to prednisone and axniety R/O no ECG changes D/C home with continued medical Rx F/U with me in 3 months Chol:  Post CABG continue statin HTN:  Continue ARB ENT:  Per primary  antihistamine  Charlton Haws 03/02/2013, 8:15 AM

## 2013-03-04 ENCOUNTER — Encounter (HOSPITAL_COMMUNITY): Payer: No Typology Code available for payment source

## 2013-03-05 ENCOUNTER — Encounter (HOSPITAL_COMMUNITY): Payer: No Typology Code available for payment source

## 2013-03-09 ENCOUNTER — Encounter (HOSPITAL_COMMUNITY): Payer: No Typology Code available for payment source

## 2013-03-11 ENCOUNTER — Encounter (HOSPITAL_COMMUNITY): Payer: No Typology Code available for payment source

## 2013-03-12 ENCOUNTER — Encounter (HOSPITAL_COMMUNITY): Payer: No Typology Code available for payment source

## 2013-03-16 ENCOUNTER — Encounter (HOSPITAL_COMMUNITY): Payer: No Typology Code available for payment source

## 2013-03-18 ENCOUNTER — Encounter (HOSPITAL_COMMUNITY): Payer: No Typology Code available for payment source

## 2013-03-19 ENCOUNTER — Encounter (HOSPITAL_COMMUNITY): Payer: No Typology Code available for payment source

## 2013-03-23 ENCOUNTER — Encounter (HOSPITAL_COMMUNITY): Payer: No Typology Code available for payment source

## 2013-03-25 ENCOUNTER — Encounter (HOSPITAL_COMMUNITY): Payer: No Typology Code available for payment source

## 2013-03-26 ENCOUNTER — Encounter (HOSPITAL_COMMUNITY): Payer: No Typology Code available for payment source

## 2013-03-30 ENCOUNTER — Encounter (HOSPITAL_COMMUNITY): Payer: No Typology Code available for payment source

## 2013-04-01 ENCOUNTER — Encounter (HOSPITAL_COMMUNITY): Payer: No Typology Code available for payment source

## 2013-04-02 ENCOUNTER — Encounter (HOSPITAL_COMMUNITY): Payer: No Typology Code available for payment source

## 2013-04-04 ENCOUNTER — Emergency Department (HOSPITAL_BASED_OUTPATIENT_CLINIC_OR_DEPARTMENT_OTHER): Payer: No Typology Code available for payment source

## 2013-04-04 ENCOUNTER — Emergency Department (HOSPITAL_BASED_OUTPATIENT_CLINIC_OR_DEPARTMENT_OTHER)
Admission: EM | Admit: 2013-04-04 | Discharge: 2013-04-04 | Disposition: A | Discharge status: Home / Self Care | Payer: No Typology Code available for payment source | Attending: Emergency Medicine | Admitting: Emergency Medicine

## 2013-04-04 ENCOUNTER — Encounter (HOSPITAL_BASED_OUTPATIENT_CLINIC_OR_DEPARTMENT_OTHER): Payer: Self-pay

## 2013-04-04 DIAGNOSIS — E785 Hyperlipidemia, unspecified: Secondary | ICD-10-CM | POA: Insufficient documentation

## 2013-04-04 DIAGNOSIS — R0789 Other chest pain: Secondary | ICD-10-CM | POA: Insufficient documentation

## 2013-04-04 DIAGNOSIS — R110 Nausea: Secondary | ICD-10-CM | POA: Insufficient documentation

## 2013-04-04 DIAGNOSIS — I10 Essential (primary) hypertension: Secondary | ICD-10-CM | POA: Insufficient documentation

## 2013-04-04 DIAGNOSIS — Z7982 Long term (current) use of aspirin: Secondary | ICD-10-CM | POA: Insufficient documentation

## 2013-04-04 DIAGNOSIS — R42 Dizziness and giddiness: Secondary | ICD-10-CM | POA: Insufficient documentation

## 2013-04-04 DIAGNOSIS — I2510 Atherosclerotic heart disease of native coronary artery without angina pectoris: Secondary | ICD-10-CM | POA: Insufficient documentation

## 2013-04-04 DIAGNOSIS — R5381 Other malaise: Secondary | ICD-10-CM | POA: Insufficient documentation

## 2013-04-04 DIAGNOSIS — Z79899 Other long term (current) drug therapy: Secondary | ICD-10-CM | POA: Insufficient documentation

## 2013-04-04 DIAGNOSIS — Z8679 Personal history of other diseases of the circulatory system: Secondary | ICD-10-CM | POA: Insufficient documentation

## 2013-04-04 DIAGNOSIS — Z951 Presence of aortocoronary bypass graft: Secondary | ICD-10-CM | POA: Insufficient documentation

## 2013-04-04 DIAGNOSIS — R079 Chest pain, unspecified: Secondary | ICD-10-CM

## 2013-04-04 LAB — BASIC METABOLIC PANEL
BUN: 10 mg/dL (ref 6–23)
Chloride: 104 mEq/L (ref 96–112)
GFR calc Af Amer: >90 mL/min (ref >90)
GFR calc non Af Amer: >90 mL/min (ref >90)
Potassium: 4.1 mEq/L (ref 3.5–5.1)
Sodium: 139 mEq/L (ref 135–145)

## 2013-04-04 LAB — CBC WITH DIFFERENTIAL/PLATELET
Basophils Absolute: 0 10*3/uL (ref 0.0–0.1)
Basophils Relative: 0 % (ref 0–1)
Eosinophils Absolute: 0.2 10*3/uL (ref 0.0–0.7)
Hemoglobin: 16.4 g/dL (ref 13.0–17.0)
MCH: 30.1 pg (ref 26.0–34.0)
MCHC: 35.6 g/dL (ref 30.0–36.0)
Neutro Abs: 5.2 10*3/uL (ref 1.7–7.7)
Neutrophils Relative %: 63 % (ref 43–77)
Platelets: 230 10*3/uL (ref 150–400)

## 2013-04-04 LAB — TROPONIN I
Troponin I: <0.3 ng/mL (ref <0.30)
Troponin I: <0.3 ng/mL (ref <0.30)

## 2013-04-04 MED ORDER — ASPIRIN 81 MG PO CHEW
324.0000 mg | CHEWABLE_TABLET | Freq: Once | ORAL | Status: AC
  Administered 2013-04-04: 324 mg via ORAL
  Filled 2013-04-04: qty 4

## 2013-04-04 NOTE — ED Notes (Signed)
Crackers and soda offered

## 2013-04-04 NOTE — ED Provider Notes (Signed)
CSN: [id number]     Arrival date & time 04/04/13  1344 History   First MD Initiated Contact with Patient 04/04/13 1353     Chief Complaint  Patient presents with  . Chest Pain   (Consider location/radiation/quality/duration/timing/severity/associated sxs/prior Treatment) HPI Comments: Patient presents with chest pain and fatigue. He has a history of coronary artery bypass graft in December of 2013. He states over the last 2 days she's been feeling more fatigued than normal. He states he just hasn't been feeling well. He's had some mild nausea and dizziness. He's had some intermittent discomfort in the center of his chest. He's had 2 episodes of a sharper pain in his left chest that radiates toward his left shoulder. These pains only last a few seconds. He does have some more ongoing discomfort to the Center of his chest. He denies any diaphoresis. He denies any shortness of breath. He was admitted in August for chest pain to [facility] cone. At that time it was felt to be related to a reaction to prednisone and some medications he was taking for eustachian tube clogging.  Patient is a 57 y.o. male presenting with chest pain.  Chest Pain Associated symptoms: fatigue and nausea   Associated symptoms: no abdominal pain, no back pain, no cough, no diaphoresis, no dizziness, no fever, no headache, no numbness, no shortness of breath, not vomiting and no weakness     Past Medical History  Diagnosis Date  . Hypertension   . Ocular migraine   . CAD (coronary artery disease)     a.  LHC 06/26/12:  pLAD 90%, prox/mid/dist CFS 20-30%, oRCA 100% (non dom), EF 55%;  b. Echo 12/13:  EF 55-60%, Gr 1 diast dysfn;  c. s/p CABG Dorris Fetch) 12/13: L-LAD/Dx    . HLD (hyperlipidemia)    Past Surgical History  Procedure Laterality Date  . Appendectomy    . Nasal septum surgery      For uncontrolled epistaxis early 2013  . Coronary artery bypass graft  06/29/2012    Procedure: CORONARY ARTERY BYPASS GRAFTING  (CABG);  Surgeon: Loreli Slot, MD;  Location: [facility] OR;  Service: Open Heart Surgery;  Laterality: N/A;  times two using Left Internal Mammary Artery   Family History  Problem Relation Age of Onset  . Heart disease Brother     CABG age 62, redo bypass ~50  . Heart disease Paternal Grandmother   . Heart disease Cousin     Maternal side  . Heart attack Brother    History  Substance Use Topics  . Smoking status: Never Smoker   . Smokeless tobacco: Never Used  . Alcohol Use: Yes     Comment: 1 drink/month    Review of Systems  Constitutional: Positive for fatigue. Negative for fever, chills and diaphoresis.  HENT: Negative for congestion, rhinorrhea and sneezing.   Eyes: Negative.   Respiratory: Negative for cough, chest tightness and shortness of breath.   Cardiovascular: Positive for chest pain. Negative for leg swelling.  Gastrointestinal: Positive for nausea. Negative for vomiting, abdominal pain, diarrhea and blood in stool.  Genitourinary: Negative for frequency, hematuria, flank pain and difficulty urinating.  Musculoskeletal: Negative for back pain and arthralgias.  Skin: Negative for rash.  Neurological: Positive for light-headedness. Negative for dizziness, speech difficulty, weakness, numbness and headaches.    Allergies  Review of patient's allergies indicates no known allergies.  Home Medications   Current Outpatient Rx  Name  Route  Sig  Dispense  Refill  .  aspirin EC 81 MG tablet   Oral   Take 1 tablet (81 mg total) by mouth daily.         Marland Kitchen atorvastatin (LIPITOR) 40 MG tablet   Oral   Take 1 tablet (40 mg total) by mouth daily at 6 PM.   30 tablet   11   . Coenzyme Q10 200 MG capsule   Oral   Take 200 mg by mouth every morning. disontinue while in hospital         . ibuprofen (ADVIL,MOTRIN) 200 MG tablet   Oral   Take 600 mg by mouth every 6 (six) hours as needed. migraine headache         . lisinopril (PRINIVIL,ZESTRIL) 10 MG tablet    Oral   Take 10 mg by mouth every morning.         . metoprolol (LOPRESSOR) 50 MG tablet   Oral   Take 1 tablet (50 mg total) by mouth 2 (two) times daily.   60 tablet   11   . nitroGLYCERIN (NITROSTAT) 0.4 MG SL tablet   Sublingual   Place 1 tablet (0.4 mg total) under the tongue every 5 (five) minutes as needed for chest pain.   25 tablet   3   . vitamin C (ASCORBIC ACID) 500 MG tablet   Oral   Take 500 mg by mouth every morning.           BP 158/87  Pulse 52  Temp(Src) 97.8 F (36.6 C) (Oral)  Resp 18  Ht 5\' 11"  (1.803 m)  Wt 180 lb (81.647 kg)  BMI 25.12 kg/m2  SpO2 100% Physical Exam  Constitutional: He is oriented to person, place, and time. He appears well-developed and well-nourished.  HENT:  Head: Normocephalic and atraumatic.  Eyes: Pupils are equal, round, and reactive to light.  Neck: Normal range of motion. Neck supple.  Cardiovascular: Normal rate, regular rhythm and normal heart sounds.   Pulmonary/Chest: Effort normal and breath sounds normal. No respiratory distress. He has no wheezes. He has no rales. He exhibits no tenderness.  Midline sternotomy scar that appears to be well healing.  Abdominal: Soft. Bowel sounds are normal. There is no tenderness. There is no rebound and no guarding.  Musculoskeletal: Normal range of motion. He exhibits no edema.  Lymphadenopathy:    He has no cervical adenopathy.  Neurological: He is alert and oriented to person, place, and time.  Skin: Skin is warm and dry. No rash noted.  Psychiatric: He has a normal mood and affect.    ED Course  Procedures (including critical care time) Labs Review Labs Reviewed  BASIC METABOLIC PANEL - Abnormal; Notable for the following:    Glucose, Bld 103 (*)    All other components within normal limits  CBC WITH DIFFERENTIAL  TROPONIN I    Date: 04/04/2013  Rate: 52  Rhythm: sinus bradycardia  QRS Axis: normal  Intervals: normal  ST/T Wave abnormalities: normal   Conduction Disutrbances:none  Narrative Interpretation:   Old EKG Reviewed: unchanged   Imaging Review Dg Chest 2 View  04/04/2013   *RADIOLOGY REPORT*  Clinical Data: Chest pain  CHEST - 2 VIEW  Comparison: 03/01/2013  Findings: The heart size and vascular pattern are normal.  The patient is status post median sternotomy.  Lungs are clear.  No pleural effusions.  IMPRESSION: Negative for acute findings.   Original Report Authenticated By: Esperanza Heir, M.D.    MDM   1. Chest pain  Patient presents with some atypical chest pain. He has no shortness of breath or diaphoresis but he does have some fatigue and dizziness associated with the pain. The pain is nonexertional. He status post recent bypass surgery in December. His EKG did not show any acute ischemic changes. History troponin is negative. I discussed the case with Dr. Myrtis Ser who is on call for [facility] cardiology and he recommends checking a second troponin if the second troponin is normal that he is okay with the patient being discharged. He will need some close followup with his cardiology office. Dr. Gwendolyn Grant is to follow up on second troponin and if it's negative the patient be discharged. If it is positive he will have to be admitted to [facility] cone for further cardiac evaluation.    Rolan Bucco, MD 04/04/13 [phone]

## 2013-04-04 NOTE — ED Notes (Signed)
MD at bedside. 

## 2013-04-04 NOTE — ED Notes (Signed)
Patient transported to X-ray 

## 2013-04-04 NOTE — ED Notes (Signed)
Pt reports yesterday was not feeling well.  Reports was watching tv and suddenly with substernal cp, intermittent nature since yesterday.  Has had lightheadedness and nausea.

## 2013-04-06 ENCOUNTER — Encounter (HOSPITAL_COMMUNITY): Payer: No Typology Code available for payment source

## 2013-04-08 ENCOUNTER — Encounter (HOSPITAL_COMMUNITY): Payer: No Typology Code available for payment source

## 2013-04-09 ENCOUNTER — Encounter (HOSPITAL_COMMUNITY): Payer: No Typology Code available for payment source

## 2013-04-13 ENCOUNTER — Encounter (HOSPITAL_COMMUNITY): Payer: No Typology Code available for payment source

## 2013-04-14 ENCOUNTER — Ambulatory Visit (INDEPENDENT_AMBULATORY_CARE_PROVIDER_SITE_OTHER): Payer: No Typology Code available for payment source | Admitting: Cardiovascular Disease

## 2013-04-14 ENCOUNTER — Encounter: Payer: Self-pay | Admitting: Cardiovascular Disease

## 2013-04-14 VITALS — BP 126/80 | HR 50 | Ht 71.0 in | Wt 184.0 lb

## 2013-04-14 DIAGNOSIS — E782 Mixed hyperlipidemia: Secondary | ICD-10-CM

## 2013-04-14 DIAGNOSIS — I10 Essential (primary) hypertension: Secondary | ICD-10-CM

## 2013-04-14 DIAGNOSIS — R072 Precordial pain: Secondary | ICD-10-CM

## 2013-04-14 DIAGNOSIS — I2510 Atherosclerotic heart disease of native coronary artery without angina pectoris: Secondary | ICD-10-CM

## 2013-04-14 MED ORDER — PANTOPRAZOLE SODIUM 40 MG PO TBEC: 40.0000 mg | DELAYED_RELEASE_TABLET | Freq: Every day | ORAL | Status: DC

## 2013-04-14 NOTE — Progress Notes (Signed)
Patient ID: Terry Hansen, male   DOB: 15-Mar-1956, 57 y.o.   MRN: [id number] Terry Hansen is transferred from med center [facility] for evaluation of chest pain. He is a 57 year old white male with history of coronary disease. He presented in December of 2013 with chest pain and cardiac catheterization at that time demonstrated a high-grade stenosis in the proximal LAD and an occluded right coronary. He underwent coronary bypass surgery  Recent ER evaluation for chest pain Thought to be side effect from prednisone given for ear issues.  R/O   Symptoms seem to be related to stress at work and GERD  Requesting protonix    ROS: Denies fever, malais, weight loss, blurry vision, decreased visual acuity, cough, sputum, SOB, hemoptysis, pleuritic pain, palpitaitons, heartburn, abdominal pain, melena, lower extremity edema, claudication, or rash.  All other systems reviewed and negative  General: Affect appropriate Healthy:  appears stated age HEENT: normal Neck supple with no adenopathy JVP normal no bruits no thyromegaly Lungs clear with no wheezing and good diaphragmatic motion Heart:  S1/S2 no murmur, no rub, gallop or click PMI normal Abdomen: benighn, BS positve, no tenderness, no AAA no bruit.  No HSM or HJR Distal pulses intact with no bruits No edema Neuro non-focal Skin warm and dry No muscular weakness   Current Outpatient Prescriptions  Medication Sig Dispense Refill  . aspirin EC 81 MG tablet Take 1 tablet (81 mg total) by mouth daily.      Marland Kitchen atorvastatin (LIPITOR) 40 MG tablet Take 1 tablet (40 mg total) by mouth daily at 6 PM.  30 tablet  11  . Coenzyme Q10 200 MG capsule Take 200 mg by mouth every morning. disontinue while in hospital      . ibuprofen (ADVIL,MOTRIN) 200 MG tablet Take 600 mg by mouth every 6 (six) hours as needed. migraine headache      . lisinopril (PRINIVIL,ZESTRIL) 10 MG tablet Take 10 mg by mouth every morning.      . metoprolol (LOPRESSOR) 50 MG tablet Take 1  tablet (50 mg total) by mouth 2 (two) times daily.  60 tablet  11  . nitroGLYCERIN (NITROSTAT) 0.4 MG SL tablet Place 1 tablet (0.4 mg total) under the tongue every 5 (five) minutes as needed for chest pain.  25 tablet  3  . vitamin C (ASCORBIC ACID) 500 MG tablet Take 500 mg by mouth every morning.        No current facility-administered medications for this visit.    Allergies  Review of patient's allergies indicates no known allergies.  Electrocardiogram:  9/16  SR rate 71  ICRBBB   Assessment and Plan

## 2013-04-14 NOTE — Assessment & Plan Note (Signed)
Well controlled.  Continue current medications and low sodium Dash type diet.    

## 2013-04-14 NOTE — Assessment & Plan Note (Signed)
Stable with no angina and good activity level.  Continue medical Rx  

## 2013-04-14 NOTE — Assessment & Plan Note (Signed)
Cholesterol is at goal.  Continue current dose of statin and diet Rx.  No myalgias or side effects.  F/U  LFT's in 6 months. Lab Results  Component Value Date   LDLCALC 48 09/01/2012

## 2013-04-14 NOTE — Addendum Note (Signed)
Addended by: Linzie Collin D on: 04/14/2013 10:14 AM   Modules accepted: Orders

## 2013-04-14 NOTE — Assessment & Plan Note (Signed)
Related to GERD Start protonix  Discussed diet and stress levels  F/U primary

## 2013-04-14 NOTE — Patient Instructions (Signed)
Your physician has recommended you make the following change in your medication:   1. Start Protonix 40 mg once daily.  Patient is being referred back to Cardiac Rehab. Over at [facility],  Your physician wants you to follow-up in: 6 months with Dr. Eden Emms. You will receive a reminder letter in the mail two months in advance. If you don't receive a letter, please call our office to schedule the follow-up appointment.

## 2013-04-15 ENCOUNTER — Encounter (HOSPITAL_COMMUNITY): Payer: No Typology Code available for payment source

## 2013-04-16 ENCOUNTER — Encounter (HOSPITAL_COMMUNITY): Payer: No Typology Code available for payment source

## 2013-04-20 ENCOUNTER — Encounter (HOSPITAL_COMMUNITY): Payer: No Typology Code available for payment source

## 2013-04-22 ENCOUNTER — Encounter (HOSPITAL_COMMUNITY): Payer: No Typology Code available for payment source

## 2013-04-23 ENCOUNTER — Encounter (HOSPITAL_COMMUNITY): Payer: No Typology Code available for payment source

## 2013-04-27 ENCOUNTER — Encounter (HOSPITAL_COMMUNITY): Payer: No Typology Code available for payment source

## 2013-04-29 ENCOUNTER — Encounter (HOSPITAL_COMMUNITY): Payer: No Typology Code available for payment source

## 2013-04-30 ENCOUNTER — Encounter (HOSPITAL_COMMUNITY): Payer: No Typology Code available for payment source

## 2013-05-04 ENCOUNTER — Encounter (HOSPITAL_COMMUNITY): Payer: No Typology Code available for payment source

## 2013-05-06 ENCOUNTER — Encounter (HOSPITAL_COMMUNITY): Payer: No Typology Code available for payment source

## 2013-05-07 ENCOUNTER — Encounter (HOSPITAL_COMMUNITY): Payer: No Typology Code available for payment source

## 2013-05-11 ENCOUNTER — Encounter (HOSPITAL_COMMUNITY): Payer: No Typology Code available for payment source

## 2013-05-13 ENCOUNTER — Encounter (HOSPITAL_COMMUNITY): Payer: No Typology Code available for payment source

## 2013-05-14 ENCOUNTER — Encounter (HOSPITAL_COMMUNITY): Payer: No Typology Code available for payment source

## 2013-05-18 ENCOUNTER — Encounter (HOSPITAL_COMMUNITY): Payer: No Typology Code available for payment source

## 2013-05-20 ENCOUNTER — Encounter (HOSPITAL_COMMUNITY): Payer: No Typology Code available for payment source

## 2013-05-21 ENCOUNTER — Encounter (HOSPITAL_COMMUNITY): Payer: No Typology Code available for payment source

## 2013-05-25 ENCOUNTER — Encounter (HOSPITAL_COMMUNITY): Payer: No Typology Code available for payment source

## 2013-05-27 ENCOUNTER — Encounter (HOSPITAL_COMMUNITY): Payer: No Typology Code available for payment source

## 2013-05-27 ENCOUNTER — Other Ambulatory Visit: Payer: Self-pay

## 2013-05-28 ENCOUNTER — Encounter (HOSPITAL_COMMUNITY): Payer: No Typology Code available for payment source

## 2013-06-01 ENCOUNTER — Encounter (HOSPITAL_COMMUNITY): Payer: No Typology Code available for payment source

## 2013-06-03 ENCOUNTER — Encounter (HOSPITAL_COMMUNITY): Payer: No Typology Code available for payment source

## 2013-06-04 ENCOUNTER — Encounter (HOSPITAL_COMMUNITY): Payer: No Typology Code available for payment source

## 2013-06-08 ENCOUNTER — Encounter (HOSPITAL_COMMUNITY): Payer: No Typology Code available for payment source

## 2013-06-10 ENCOUNTER — Encounter (HOSPITAL_COMMUNITY): Payer: No Typology Code available for payment source

## 2013-06-11 ENCOUNTER — Encounter (HOSPITAL_COMMUNITY): Payer: No Typology Code available for payment source

## 2013-06-15 ENCOUNTER — Encounter (HOSPITAL_COMMUNITY): Payer: No Typology Code available for payment source

## 2013-06-22 ENCOUNTER — Encounter (HOSPITAL_COMMUNITY): Payer: No Typology Code available for payment source

## 2013-06-24 ENCOUNTER — Encounter (HOSPITAL_COMMUNITY): Payer: No Typology Code available for payment source

## 2013-06-25 ENCOUNTER — Encounter (HOSPITAL_COMMUNITY): Payer: No Typology Code available for payment source

## 2013-06-29 ENCOUNTER — Encounter (HOSPITAL_COMMUNITY): Payer: No Typology Code available for payment source

## 2013-07-01 ENCOUNTER — Encounter (HOSPITAL_COMMUNITY): Payer: No Typology Code available for payment source

## 2013-07-02 ENCOUNTER — Encounter (HOSPITAL_COMMUNITY): Payer: No Typology Code available for payment source

## 2013-07-06 ENCOUNTER — Encounter (HOSPITAL_COMMUNITY): Payer: No Typology Code available for payment source

## 2013-07-08 ENCOUNTER — Encounter (HOSPITAL_COMMUNITY): Payer: No Typology Code available for payment source

## 2013-07-09 ENCOUNTER — Encounter (HOSPITAL_COMMUNITY): Payer: No Typology Code available for payment source

## 2013-07-13 ENCOUNTER — Encounter (HOSPITAL_COMMUNITY): Payer: No Typology Code available for payment source

## 2013-07-20 ENCOUNTER — Encounter (HOSPITAL_COMMUNITY): Payer: No Typology Code available for payment source

## 2013-07-27 ENCOUNTER — Other Ambulatory Visit: Payer: Self-pay | Admitting: Physician Assistant

## 2013-10-08 ENCOUNTER — Other Ambulatory Visit: Payer: Self-pay | Admitting: Cardiovascular Disease

## 2013-10-22 ENCOUNTER — Other Ambulatory Visit: Payer: Self-pay | Admitting: Cardiovascular Disease

## 2013-11-29 ENCOUNTER — Ambulatory Visit (INDEPENDENT_AMBULATORY_CARE_PROVIDER_SITE_OTHER): Payer: No Typology Code available for payment source | Admitting: Cardiovascular Disease

## 2013-11-29 ENCOUNTER — Encounter: Payer: Self-pay | Admitting: Cardiovascular Disease

## 2013-11-29 VITALS — BP 128/84 | HR 55 | Ht 71.0 in | Wt 186.0 lb

## 2013-11-29 DIAGNOSIS — Z79899 Other long term (current) drug therapy: Secondary | ICD-10-CM

## 2013-11-29 DIAGNOSIS — E785 Hyperlipidemia, unspecified: Secondary | ICD-10-CM

## 2013-11-29 DIAGNOSIS — Z0000 Encounter for general adult medical examination without abnormal findings: Secondary | ICD-10-CM

## 2013-11-29 MED ORDER — NITROGLYCERIN 0.4 MG SL SUBL: 0.4000 mg | SUBLINGUAL_TABLET | SUBLINGUAL | Status: AC | PRN

## 2013-11-29 NOTE — Progress Notes (Signed)
Patient ID: Terry Hansen, male   DOB: 17-May-1956, 58 y.o.   MRN: [id number] Terry Hansen is transferred from med center [facility] for evaluation of chest pain. He is a 58 year old white male with history of coronary disease. He presented in December of 2013 with chest pain and cardiac catheterization at that time demonstrated a high-grade stenosis in the proximal LAD and an occluded right coronary. He underwent coronary bypass surgery Recent ER evaluation for chest pain Thought to be side effect from prednisone given for ear issues. R/O Symptoms seem to be related to stress at work and GERD Requesting protonix   12.5.13 CABG [place] PROCEDURE: Median sternotomy, extracorporeal circulation, coronary  artery bypass grafting x2 (sequential left internal mammary artery to  the first diagonal and LAD).  Does not like his primary care doctor in [facility]  Has not had chol/LFTls checked in over a year.  No chest pain      ROS: Denies fever, malais, weight loss, blurry vision, decreased visual acuity, cough, sputum, SOB, hemoptysis, pleuritic pain, palpitaitons, heartburn, abdominal pain, melena, lower extremity edema, claudication, or rash.  All other systems reviewed and negative  General: Affect appropriate Healthy:  appears stated age 28: normal Neck supple with no adenopathy JVP normal no bruits no thyromegaly Lungs clear with no wheezing and good diaphragmatic motion Heart:  S1/S2 no murmur, no rub, gallop or click PMI normal Abdomen: benighn, BS positve, no tenderness, no AAA no bruit.  No HSM or HJR Distal pulses intact with no bruits No edema Neuro non-focal Skin warm and dry No muscular weakness   Current Outpatient Prescriptions  Medication Sig Dispense Refill  . aspirin EC 81 MG tablet Take 1 tablet (81 mg total) by mouth daily.      Marland Kitchen atorvastatin (LIPITOR) 40 MG tablet TAKE 1 TABLET BY MOUTH EVERY DAY AT 6 PM  30 tablet  2  . lisinopril (PRINIVIL,ZESTRIL) 10 MG  tablet TAKE 1 TABLET BY MOUTH EVERY DAY  30 tablet  2  . metoprolol (LOPRESSOR) 50 MG tablet TAKE 1 TABLET BY MOUTH TWICE DAILY  60 tablet  2  . vitamin C (ASCORBIC ACID) 500 MG tablet Take 500 mg by mouth every morning.       . Coenzyme Q10 200 MG capsule Take 200 mg by mouth every morning. disontinue while in hospital      . ibuprofen (ADVIL,MOTRIN) 200 MG tablet Take 600 mg by mouth every 6 (six) hours as needed. migraine headache      . nitroGLYCERIN (NITROSTAT) 0.4 MG SL tablet Place 1 tablet (0.4 mg total) under the tongue every 5 (five) minutes as needed for chest pain.  25 tablet  3  . pantoprazole (PROTONIX) 40 MG tablet TAKE 1 TABLET BY MOUTH ONCE DAILY  30 tablet  1   No current facility-administered medications for this visit.    Allergies  Review of patient's allergies indicates no known allergies.  Electrocardiogram: 9/16  SR ICRBBB   Assessment and Plan

## 2013-11-29 NOTE — Assessment & Plan Note (Signed)
Needs f/u lipid and liver profile  Will order for this week as he has already eaten this am

## 2013-11-29 NOTE — Patient Instructions (Addendum)
Your physician wants you to follow-up in:    [place] will receive a reminder letter in the mail two months in advance. If you don't receive a letter, please call our office to schedule the follow-up appointment. Your physician recommends that you continue on your current medications as directed. Please refer to the Current Medication list given to you today. Your physician recommends that you return for lab work in: [place] have been referred to [place]

## 2013-11-29 NOTE — Assessment & Plan Note (Signed)
Stable with no angina and good activity level.  Continue medical Rx  

## 2013-11-30 ENCOUNTER — Other Ambulatory Visit (INDEPENDENT_AMBULATORY_CARE_PROVIDER_SITE_OTHER): Payer: No Typology Code available for payment source

## 2013-11-30 DIAGNOSIS — Z79899 Other long term (current) drug therapy: Secondary | ICD-10-CM

## 2013-11-30 DIAGNOSIS — E785 Hyperlipidemia, unspecified: Secondary | ICD-10-CM

## 2013-11-30 LAB — HEPATIC FUNCTION PANEL
ALK PHOS: 99 U/L (ref 39–117)
ALT: 28 U/L (ref 0–53)
AST: 20 U/L (ref 0–37)
Albumin: 3.5 g/dL (ref 3.5–5.2)
BILIRUBIN TOTAL: 0.8 mg/dL (ref 0.2–1.2)
Bilirubin, Direct: 0.1 mg/dL (ref 0.0–0.3)
Total Protein: 6.2 g/dL (ref 6.0–8.3)

## 2013-11-30 LAB — LIPID PANEL
CHOL/HDL RATIO: 3
Cholesterol: 104 mg/dL (ref 0–200)
HDL: 34.8 mg/dL — ABNORMAL LOW (ref >39.00)
LDL Cholesterol: 58 mg/dL (ref 0–99)
TRIGLYCERIDES: 55 mg/dL (ref 0.0–149.0)
VLDL: 11 mg/dL (ref 0.0–40.0)

## 2013-12-10 ENCOUNTER — Other Ambulatory Visit: Payer: Self-pay | Admitting: Cardiovascular Disease

## 2014-01-21 ENCOUNTER — Other Ambulatory Visit: Payer: Self-pay | Admitting: Cardiovascular Disease

## 2014-01-31 ENCOUNTER — Encounter: Payer: Self-pay | Admitting: Internal Medicine

## 2014-01-31 ENCOUNTER — Ambulatory Visit (INDEPENDENT_AMBULATORY_CARE_PROVIDER_SITE_OTHER): Payer: No Typology Code available for payment source | Admitting: Internal Medicine

## 2014-01-31 VITALS — BP 142/84 | HR 68 | Temp 98.2°F | Ht 71.0 in | Wt 189.0 lb

## 2014-01-31 DIAGNOSIS — Z0000 Encounter for general adult medical examination without abnormal findings: Secondary | ICD-10-CM

## 2014-01-31 DIAGNOSIS — K219 Gastro-esophageal reflux disease without esophagitis: Secondary | ICD-10-CM

## 2014-01-31 DIAGNOSIS — I25810 Atherosclerosis of coronary artery bypass graft(s) without angina pectoris: Secondary | ICD-10-CM

## 2014-01-31 DIAGNOSIS — I10 Essential (primary) hypertension: Secondary | ICD-10-CM

## 2014-01-31 DIAGNOSIS — Z125 Encounter for screening for malignant neoplasm of prostate: Secondary | ICD-10-CM

## 2014-01-31 DIAGNOSIS — E782 Mixed hyperlipidemia: Secondary | ICD-10-CM

## 2014-01-31 LAB — CBC WITH DIFFERENTIAL/PLATELET
BASOS ABS: 0 10*3/uL (ref 0.0–0.1)
Basophils Relative: 0.3 % (ref 0.0–3.0)
EOS ABS: 0.4 10*3/uL (ref 0.0–0.7)
Eosinophils Relative: 4.6 % (ref 0.0–5.0)
HEMATOCRIT: 46.4 % (ref 39.0–52.0)
HEMOGLOBIN: 15.4 g/dL (ref 13.0–17.0)
LYMPHS ABS: 1.7 10*3/uL (ref 0.7–4.0)
Lymphocytes Relative: 21.9 % (ref 12.0–46.0)
MCHC: 33.3 g/dL (ref 30.0–36.0)
MCV: 87.7 fl (ref 78.0–100.0)
MONOS PCT: 8.4 % (ref 3.0–12.0)
Monocytes Absolute: 0.6 10*3/uL (ref 0.1–1.0)
NEUTROS ABS: 5 10*3/uL (ref 1.4–7.7)
Neutrophils Relative %: 64.8 % (ref 43.0–77.0)
Platelets: 268 10*3/uL (ref 150.0–400.0)
RBC: 5.3 Mil/uL (ref 4.22–5.81)
RDW: 12.7 % (ref 11.5–15.5)
WBC: 7.7 10*3/uL (ref 4.0–10.5)

## 2014-01-31 MED ORDER — NEBIVOLOL HCL 10 MG PO TABS: 10.0000 mg | ORAL_TABLET | Freq: Every day | ORAL | Status: DC

## 2014-01-31 NOTE — Progress Notes (Signed)
Subjective:    Patient ID: Terry Hansen, male    DOB: Jun 15, 1956, 58 y.o.   MRN: [id number]  HPI  58 year old white male with history of coronary artery disease, mixed hyperlipidemia and hypertension to establish. He should previously followed by primary care physician in [facility]. He has history of coronary artery disease status post CABG in December of 2013. Patient noted to have normal ejection fraction. He is followed by cardiology. Patient currently taking combination of ACE inhibitor, beta blocker and atorvastatin. Patient complains of chronic fatigue and lack of energy. This is been ongoing for greater than 1 year.    He denies any associated weakness or muscle pain.  Hypertension his blood pressure today is high normal. Patient reports taking his medication regularly. Patient reports she has been stressful situation. She is currently in the process of moving from her [place].  He has history of ocular migraine. His exacerbations are infrequent. His symptoms are with noticing squiggly lines in blurred vision which can then lead to migraine headache. Patient usually 02 abort headaches with using over-the-counter Advil.  Preventative health care-patient said last colonoscopy was 15 years ago. His maternal grandfather noted to have history of colon cancer.  Review of Systems  Constitutional: Negative for activity change, appetite change and unexpected weight change.  Eyes: Negative for visual disturbance.  Respiratory: Negative for cough, chest tightness and shortness of breath.   Cardiovascular: Negative for chest pain.  Genitourinary: Negative for difficulty urinating.  Neurological: Negative for headaches.  Gastrointestinal: Negative for abdominal pain, heartburn melena or hematochezia Psych: Negative for depression or anxiety Endo:  History of mild ED        Past Medical History  Diagnosis Date  . Hypertension   . Ocular migraine   . CAD (coronary artery  disease)     a.  LHC 06/26/12:  pLAD 90%, prox/mid/dist CFS 20-30%, oRCA 100% (non dom), EF 55%;  b. Echo 12/13:  EF 55-60%, Gr 1 diast dysfn;  c. s/p CABG Roxan Hockey) 12/13: L-LAD/Dx    . HLD (hyperlipidemia)     History   Social History  . Marital Status: Legally Separated    Spouse Name: N/A    Number of Children: N/A  . Years of Education: N/A   Occupational History  . Not on file.   Social History Main Topics  . Smoking status: Never Smoker   . Smokeless tobacco: Never Used  . Alcohol Use: Yes     Comment: 1 drink/month  . Drug Use: No  . Sexual Activity: Not on file   Other Topics Concern  . Not on file   Social History Narrative  . No narrative on file    Past Surgical History  Procedure Laterality Date  . Appendectomy    . Nasal septum surgery      For uncontrolled epistaxis early 2013  . Coronary artery bypass graft  06/29/2012    Procedure: CORONARY ARTERY BYPASS GRAFTING (CABG);  Surgeon: Melrose Nakayama, MD;  Location: [place];  Service: Open Heart Surgery;  Laterality: N/A;  times two using Left Internal Mammary Artery    Family History  Problem Relation Age of Onset  . Heart disease Brother     CABG age 53, redo bypass ~50  . Heart disease Paternal Grandmother   . Heart disease Cousin     Maternal side  . Heart attack Brother     No Known Allergies  Current Outpatient Prescriptions on File Prior to Visit  Medication Sig Dispense Refill  . aspirin EC 81 MG tablet Take 1 tablet (81 mg total) by mouth daily.      Marland Kitchen atorvastatin (LIPITOR) 40 MG tablet TAKE 1 TABLET BY MOUTH EVERY DAY AT 6 PM  30 tablet  5  . Coenzyme Q10 200 MG capsule Take 200 mg by mouth every morning. disontinue while in hospital      . ibuprofen (ADVIL,MOTRIN) 200 MG tablet Take 600 mg by mouth every 6 (six) hours as needed. migraine headache      . lisinopril (PRINIVIL,ZESTRIL) 10 MG tablet TAKE 1 TABLET BY MOUTH EVERY DAY  30 tablet  5  . metoprolol (LOPRESSOR) 50 MG  tablet TAKE 1 TABLET BY MOUTH TWICE DAILY  60 tablet  5  . nitroGLYCERIN (NITROSTAT) 0.4 MG SL tablet Place 1 tablet (0.4 mg total) under the tongue every 5 (five) minutes as needed for chest pain.  25 tablet  3  . pantoprazole (PROTONIX) 40 MG tablet TAKE 1 TABLET BY MOUTH DAILY  30 tablet  5   No current facility-administered medications on file prior to visit.    BP 142/84  Pulse 68  Temp(Src) 98.2 F (36.8 C) (Oral)  Ht 5\' 11"  (1.803 m)  Wt 189 lb (85.73 kg)  BMI 26.37 kg/m2    Objective:   Physical Exam  Constitutional: He is oriented to person, place, and time. He appears well-developed and well-nourished. No distress.  HENT:  Head: Normocephalic.  Right Ear: External ear normal.  Left Ear: External ear normal.  Mouth/Throat: Oropharynx is clear and moist.  Eyes: EOM are normal. Pupils are equal, round, and reactive to light.  Neck: Neck supple.  No carotid bruit  Cardiovascular: Normal rate, regular rhythm and normal heart sounds.   No murmur heard. Pulmonary/Chest: Effort normal and breath sounds normal. He has no wheezes.  Abdominal: Soft. Bowel sounds are normal. He exhibits no mass. There is no tenderness.  Genitourinary: Rectum normal, prostate normal and penis normal. Guaiac negative stool.  Musculoskeletal: He exhibits no edema.  Lymphadenopathy:    He has no cervical adenopathy.  Neurological: He is alert and oriented to person, place, and time. No cranial nerve deficit.  Psychiatric: He has a normal mood and affect. His behavior is normal.      Assessment & Plan:

## 2014-01-31 NOTE — Assessment & Plan Note (Addendum)
Reviewed adult health maintenance protocols.  Refer for colonoscopy (last exam 15 years ago).  Patient updated with Tdap.  We discussed pros and cons of prostate cancer screening.  DRE was normal.  Monitor PSA.

## 2014-01-31 NOTE — Assessment & Plan Note (Signed)
I doubt fatigue related to statin use.  Check CPK.  No appreciable weakness on physical exam.

## 2014-01-31 NOTE — Assessment & Plan Note (Addendum)
BP well controlled but patient complains of chronic fatigue.  He also has hx of mild ED.  Discontinue metoprolol. Switch to Bystolic 10 mg. Patient advised to monitor blood pressure and pulse at home. If pulse less than 60, patient advised to decrease Bystolic dose to 5 mg. BP: 142/84 mmHg

## 2014-01-31 NOTE — Progress Notes (Signed)
Pre visit review using our clinic review tool, if applicable. No additional management support is needed unless otherwise documented below in the visit note. 

## 2014-01-31 NOTE — Assessment & Plan Note (Signed)
Discussed possibly transitioning off PPI with ranitidine.

## 2014-01-31 NOTE — Assessment & Plan Note (Addendum)
Managed by cardiology.  Patient on moderate intensity statin therapy.

## 2014-01-31 NOTE — Patient Instructions (Signed)
Ask your insurance co about coverage for shingles vaccine

## 2014-02-01 ENCOUNTER — Telehealth: Payer: Self-pay | Admitting: Internal Medicine

## 2014-02-01 ENCOUNTER — Encounter: Payer: Self-pay | Admitting: Family Medicine

## 2014-02-01 ENCOUNTER — Ambulatory Visit (INDEPENDENT_AMBULATORY_CARE_PROVIDER_SITE_OTHER): Payer: No Typology Code available for payment source | Admitting: Family Medicine

## 2014-02-01 VITALS — BP 120/90 | Temp 98.1°F | Wt 188.0 lb

## 2014-02-01 DIAGNOSIS — R197 Diarrhea, unspecified: Secondary | ICD-10-CM

## 2014-02-01 DIAGNOSIS — I10 Essential (primary) hypertension: Secondary | ICD-10-CM

## 2014-02-01 LAB — BASIC METABOLIC PANEL
BUN: 14 mg/dL (ref 6–23)
CHLORIDE: 104 meq/L (ref 96–112)
CO2: 31 meq/L (ref 19–32)
CREATININE: 1 mg/dL (ref 0.4–1.5)
Calcium: 9 mg/dL (ref 8.4–10.5)
GFR: 79.97 mL/min (ref >60.00)
Glucose, Bld: 83 mg/dL (ref 70–99)
Potassium: 4.3 mEq/L (ref 3.5–5.1)
Sodium: 139 mEq/L (ref 135–145)

## 2014-02-01 LAB — T4, FREE: Free T4: 1.17 ng/dL (ref 0.60–1.60)

## 2014-02-01 LAB — TSH: TSH: 0.91 u[IU]/mL (ref 0.35–4.50)

## 2014-02-01 LAB — PSA: PSA: 0.78 ng/mL (ref 0.10–4.00)

## 2014-02-01 LAB — CK: Total CK: 129 U/L (ref 7–232)

## 2014-02-01 NOTE — Telephone Encounter (Signed)
Patient Information:  Caller Name: Marden Noble  Phone: [phone]  Patient: Terry Hansen, Terry Hansen  Gender: Male  DOB: [birth year]  Age: 58 Years  PCP: Shawna Orleans Doe-Hyun Herbie [place]) (Adults only)  Office Follow Up:  Does the office need to follow up with this patient?: No  Instructions For The Office: N/A   Symptoms  Reason For Call & Symptoms: Bodyaches, Weakness followed by diarrhea that started this AM with only one episode around 7:30am. Patient describes weakness as extreme and keeping him from his normal routine. When asked if patient could walk across the room without assistance he stated "I could but I don't know for how long." Patient had annual physcial exam yesterday, 7/13, with tetanus shot.  Reviewed Health History In EMR: Yes  Reviewed Medications In EMR: Yes  Reviewed Allergies In EMR: Yes  Reviewed Surgeries / Procedures: Yes  Date of Onset of Symptoms: 01/31/2014  Guideline(s) Used:  Weakness (Generalized) and Fatigue  Immunization Reactions  Disposition Per Guideline:   See Today in Office  Reason For Disposition Reached:   Taking a medicine that could cause weakness (e.g., blood pressure medications, diuretics)  Advice Given:  N/A  Patient Will Follow Care Advice:  YES  Appointment Scheduled:  02/01/2014 14:30:00 Appointment Scheduled Provider:  Stevie Kern (Family Practice)

## 2014-02-01 NOTE — Patient Instructions (Signed)
Purchase a pump up digital blood pressure cuff......... check your blood pressure once daily in the morning,,,,,,,, followup with your doctor as outlined

## 2014-02-01 NOTE — Telephone Encounter (Signed)
Relevant patient education assigned to patient using Emmi. ° °

## 2014-02-01 NOTE — Telephone Encounter (Signed)
Noted  

## 2014-02-01 NOTE — Progress Notes (Signed)
   Subjective:    Patient ID: Terry Hansen, male    DOB: 01-11-1956, 58 y.o.   MRN: [id number]  HPI Terry Hansen is a 58 year old male nonsmoker who comes in today for evaluation of a loose bowel movement  He was seen here yesterday by Dr. Lorelee New as a new patient. He had elevated blood pressure and was started on by systolic 10 mg daily. Last night he developed some crampy abdominal pain and one loose bowel movement. No fever or chills he did have some aching all over. However no vomiting and only one bowel movement in today he's back to normal. He thinks his symptoms may related to his tetanus shot that he had yesterday  No urinary tract symptoms   Review of Systems    review of systems otherwise negative Objective:   Physical Exam  Well-developed and nourished male no acute distress vital signs stable he is afebrile BP today 120/90 pulse 70 and regular  Abdominal exam the abdomen is flat bowel sounds are normal no palpable masses no tenderness      Assessment & Plan:  One episode of loose bowel movement question viral......Marland Kitchen reassured  Hypertension.....Marland Kitchen continue current medications BP check daily followup with Dr. Darius Bump as outlined

## 2014-02-05 ENCOUNTER — Emergency Department (HOSPITAL_COMMUNITY)
Admission: EM | Admit: 2014-02-05 | Discharge: 2014-02-05 | Disposition: A | Discharge status: Home / Self Care | Payer: No Typology Code available for payment source | Attending: Emergency Medicine | Admitting: Emergency Medicine

## 2014-02-05 ENCOUNTER — Encounter (HOSPITAL_COMMUNITY): Payer: Self-pay | Admitting: Emergency Medicine

## 2014-02-05 ENCOUNTER — Emergency Department (HOSPITAL_COMMUNITY): Payer: No Typology Code available for payment source

## 2014-02-05 DIAGNOSIS — Z8669 Personal history of other diseases of the nervous system and sense organs: Secondary | ICD-10-CM | POA: Insufficient documentation

## 2014-02-05 DIAGNOSIS — Z7982 Long term (current) use of aspirin: Secondary | ICD-10-CM | POA: Insufficient documentation

## 2014-02-05 DIAGNOSIS — Z79899 Other long term (current) drug therapy: Secondary | ICD-10-CM | POA: Insufficient documentation

## 2014-02-05 DIAGNOSIS — R0789 Other chest pain: Secondary | ICD-10-CM | POA: Insufficient documentation

## 2014-02-05 DIAGNOSIS — Z791 Long term (current) use of non-steroidal anti-inflammatories (NSAID): Secondary | ICD-10-CM | POA: Insufficient documentation

## 2014-02-05 DIAGNOSIS — H538 Other visual disturbances: Secondary | ICD-10-CM | POA: Insufficient documentation

## 2014-02-05 DIAGNOSIS — I2510 Atherosclerotic heart disease of native coronary artery without angina pectoris: Secondary | ICD-10-CM | POA: Insufficient documentation

## 2014-02-05 DIAGNOSIS — E785 Hyperlipidemia, unspecified: Secondary | ICD-10-CM | POA: Insufficient documentation

## 2014-02-05 DIAGNOSIS — I10 Essential (primary) hypertension: Secondary | ICD-10-CM | POA: Insufficient documentation

## 2014-02-05 DIAGNOSIS — Z951 Presence of aortocoronary bypass graft: Secondary | ICD-10-CM | POA: Insufficient documentation

## 2014-02-05 LAB — CBC WITH DIFFERENTIAL/PLATELET
BASOS PCT: 0 % (ref 0–1)
Basophils Absolute: 0 10*3/uL (ref 0.0–0.1)
EOS ABS: 0.5 10*3/uL (ref 0.0–0.7)
EOS PCT: 5 % (ref 0–5)
HCT: 43.2 % (ref 39.0–52.0)
HEMOGLOBIN: 15.1 g/dL (ref 13.0–17.0)
Lymphocytes Relative: 28 % (ref 12–46)
Lymphs Abs: 2.6 10*3/uL (ref 0.7–4.0)
MCH: 29.7 pg (ref 26.0–34.0)
MCHC: 35 g/dL (ref 30.0–36.0)
MCV: 85 fL (ref 78.0–100.0)
MONOS PCT: 7 % (ref 3–12)
Monocytes Absolute: 0.7 10*3/uL (ref 0.1–1.0)
NEUTROS PCT: 60 % (ref 43–77)
Neutro Abs: 5.6 10*3/uL (ref 1.7–7.7)
Platelets: 215 10*3/uL (ref 150–400)
RBC: 5.08 MIL/uL (ref 4.22–5.81)
RDW: 12.3 % (ref 11.5–15.5)
WBC: 9.3 10*3/uL (ref 4.0–10.5)

## 2014-02-05 LAB — COMPREHENSIVE METABOLIC PANEL
ALBUMIN: 3.7 g/dL (ref 3.5–5.2)
ALK PHOS: 121 U/L — AB (ref 39–117)
ALT: 37 U/L (ref 0–53)
ANION GAP: 16 — AB (ref 5–15)
AST: 25 U/L (ref 0–37)
BUN: 12 mg/dL (ref 6–23)
CALCIUM: 9 mg/dL (ref 8.4–10.5)
CO2: 23 mEq/L (ref 19–32)
CREATININE: 0.89 mg/dL (ref 0.50–1.35)
Chloride: 100 mEq/L (ref 96–112)
GFR calc Af Amer: >90 mL/min (ref >90)
GFR calc non Af Amer: >90 mL/min (ref >90)
Glucose, Bld: 89 mg/dL (ref 70–99)
POTASSIUM: 3.5 meq/L — AB (ref 3.7–5.3)
Sodium: 139 mEq/L (ref 137–147)
TOTAL PROTEIN: 6.5 g/dL (ref 6.0–8.3)
Total Bilirubin: 0.5 mg/dL (ref 0.3–1.2)

## 2014-02-05 LAB — I-STAT TROPONIN, ED: TROPONIN I, POC: 0 ng/mL (ref 0.00–0.08)

## 2014-02-05 LAB — TROPONIN I

## 2014-02-05 NOTE — ED Provider Notes (Signed)
CSN: [id number]     Arrival date & time 02/05/14  0300 History   First MD Initiated Contact with Patient 02/05/14 [phone]     Chief Complaint  Patient presents with  . Hypertension     (Consider location/radiation/quality/duration/timing/severity/associated sxs/prior Treatment) The history is provided by the patient. No language interpreter was used.   Terry Hansen Is a 58 year old male who presents emergency Department with chief complaint of chest tightness and hypertension. He has a past medical history of coronary artery disease status post CABG in 2013, hyperlipidemia, hypertension, and ocular migraine. Patient states that last night at his house he had sudden onset bilateral blurred vision which lasted just a few minutes.Denies, HA, unilateral weakness, facial asymmetry, difficulty with speech, change in gait, or vertigo. Patient states just after that he felt something "pop in my chest." He states it made him feel anxious, he noticed chest tightening, took his blood pressure and got a reading of 179/108. The patient became more nervous and came to the emergency department for evaluation. Patient denies any current symptoms. He denies any chest pain, pain in the left shoulder or jaw, nausea, vomiting, diaphoresis. Patient states that he has had a recent change in his medications from metoprolol to nebivolol and states that his hypertension has not been well-controlled. Patient states that he is anxious about his hypertension being elevated. He states that he has not been taking his daily log as prescribed by his physician because it makes him obsess about his blood pressure. Denies fevers, chills, myalgias, arthralgias. Denies DOE, SOB, chest tightness or pressure, radiation to left arm, jaw or back, or diaphoresis. Denies dysuria, flank pain, suprapubic pain, frequency, urgency, or hematuria. Denies headaches, light headedness, weakness, visual disturbances. Denies abdominal pain, nausea,  vomiting, diarrhea or constipation.    Past Medical History  Diagnosis Date  . Hypertension   . Ocular migraine   . CAD (coronary artery disease)     a.  LHC 06/26/12:  pLAD 90%, prox/mid/dist CFS 20-30%, oRCA 100% (non dom), EF 55%;  b. Echo 12/13:  EF 55-60%, Gr 1 diast dysfn;  c. s/p CABG Roxan Hockey) 12/13: L-LAD/Dx    . HLD (hyperlipidemia)    Past Surgical History  Procedure Laterality Date  . Appendectomy    . Nasal septum surgery      For uncontrolled epistaxis early 2013  . Coronary artery bypass graft  06/29/2012    Procedure: CORONARY ARTERY BYPASS GRAFTING (CABG);  Surgeon: Melrose Nakayama, MD;  Location: [facility];  Service: Open Heart Surgery;  Laterality: N/A;  times two using Left Internal Mammary Artery   Family History  Problem Relation Age of Onset  . Heart disease Brother     CABG age 24, redo bypass ~50  . Heart disease Paternal Grandmother   . Heart disease Cousin     Maternal side  . Heart attack Brother   . Colon cancer Maternal Grandfather    History  Substance Use Topics  . Smoking status: Never Smoker   . Smokeless tobacco: Never Used  . Alcohol Use: Yes     Comment: 1 drink/month    Review of Systems  Ten systems reviewed and are negative for acute change, except as noted in the HPI.    Allergies  Review of patient's allergies indicates no known allergies.  Home Medications   Prior to Admission medications   Medication Sig Start Date End Date Taking? Authorizing Provider  aspirin EC 81 MG tablet Take 1 tablet (81 mg  total) by mouth daily. 07/21/12  Yes Scott Joylene Draft, PA-C  atorvastatin (LIPITOR) 40 MG tablet Take 40 mg by mouth daily.   Yes Historical Provider, MD  Coenzyme Q10 200 MG capsule Take 200 mg by mouth every morning. disontinue while in hospital   Yes Historical Provider, MD  ibuprofen (ADVIL,MOTRIN) 200 MG tablet Take 600 mg by mouth every 6 (six) hours as needed. migraine headache   Yes Historical Provider, MD  lisinopril  (PRINIVIL,ZESTRIL) 10 MG tablet Take 10 mg by mouth daily.   Yes Historical Provider, MD  nebivolol (BYSTOLIC) 10 MG tablet Take 1 tablet (10 mg total) by mouth daily. 01/31/14  Yes Doe-Hyun R Shawna Orleans, DO  nitroGLYCERIN (NITROSTAT) 0.4 MG SL tablet Place 1 tablet (0.4 mg total) under the tongue every 5 (five) minutes as needed for chest pain. 11/29/13   Josue Hector, MD   BP 149/89  Pulse 57  Temp(Src) 97.5 F (36.4 C) (Oral)  Resp 15  Ht 5\' 11"  (1.803 m)  Wt 185 lb (83.915 kg)  BMI 25.81 kg/m2  SpO2 98% Physical Exam   Physical Exam  Nursing note and vitals reviewed. Constitutional: He appears well-developed and well-nourished. No distress.  HENT:  Head: Normocephalic and atraumatic.  Eyes: Conjunctivae normal are normal. No scleral icterus. PERRL, EOMI Neck: Normal range of motion. Neck supple.  Cardiovascular: Normal rate, regular rhythm and normal heart sounds.   Pulmonary/Chest: Effort normal and breath sounds normal. No respiratory distress.  No chest tenderness. Abdominal: Soft. There is no tenderness.  Musculoskeletal: He exhibits no edema.  Neurological: He is alert. Oriented. GCS 15 Skin: Skin is warm and dry. He is not diaphoretic.  Psychiatric: His behavior is normal.    ED Course  Procedures (including critical care time) Labs Review Labs Reviewed  COMPREHENSIVE METABOLIC PANEL - Abnormal; Notable for the following:    Potassium 3.5 (*)    Alkaline Phosphatase 121 (*)    Anion gap 16 (*)    All other components within normal limits  CBC WITH DIFFERENTIAL  TROPONIN I  Randolm Idol, ED    Imaging Review Dg Chest 2 View  02/05/2014   CLINICAL DATA:  Hypertension, blurry vision.  EXAM: CHEST  2 VIEW  COMPARISON:  Chest radiograph April 04, 2013  FINDINGS: Cardiomediastinal silhouette is normal, status post median sternotomy for apparent CABG. The lungs are clear without pleural effusions or focal consolidations. Trachea projects midline and there is no  pneumothorax. Soft tissue planes and included osseous structures are non-suspicious.  IMPRESSION: No active cardiopulmonary disease.   Electronically Signed   By: Elon Alas   On: 02/05/2014 04:08     EKG Interpretation None      MDM   Final diagnoses:  Chest tightness  Blurred vision  Essential hypertension   7:54 AM Filed Vitals:   02/05/14 0545 02/05/14 0645 02/05/14 0700 02/05/14 0733  BP: 147/95 148/85 141/75 149/89  Pulse: 53 54 57   Temp:      TempSrc:      Resp: 13 18  15   Height:      Weight:      SpO2: 98% 100% 100% 98%    Patient with mild hypertension here in the emergency department.  His symptoms are resolved. Chest tightness was nonexertional, and not associated with dyspnea or other concerning symptoms. Labs show mild hypokalemia and elevated alkaline phosphatase however otherwise not concerning for acute abnormality. Negative chest x-ray, EKG is not concerning for ischemia and unchanged from  previous. Negative troponin. Patient has no focal neurologic abnormalities.  ? Ocular migraine, vs TIA. ABCD2 score of 2,(low risk) Will repeat a troponin at this time.   Filed Vitals:   02/05/14 0700 02/05/14 0730 02/05/14 0733 02/05/14 0815  BP: 141/75 149/89 149/89 136/73  Pulse: 57 71  68  Temp:      TempSrc:      Resp:  16 15 11   Height:      Weight:      SpO2: 100% 98% 98% 97%   The patient has 2 negative troponin, A chart review shows a history of precordial pain. Patient has had no repeat episodes of pain here in the ED.  Patient is to be discharged with recommendation to follow up with PCP in regards to today's hospital visit. Chest pain is not likely of cardiac or pulmonary etiology d/t presentation,Well's low  Risk, VSS, no tracheal deviation, no JVD or new murmur, RRR, breath sounds equal bilaterally, EKG without acute abnormalities, negative troponin, and negative CXR. Pt has been advised to return to the ED is CP becomes exertional, associated  with diaphoresis or nausea, radiates to left jaw/arm, worsens or becomes concerning in any way. Pt appears reliable for follow up and is agreeable to discharge.      Margarita Mail, PA-C 02/05/14 1955

## 2014-02-05 NOTE — ED Provider Notes (Signed)
Medical screening examination/treatment/procedure(s) were performed by non-physician practitioner and as supervising physician I was immediately available for consultation/collaboration.   EKG Interpretation   Date/Time:  Saturday February 05 2014 03:05:07 EDT Ventricular Rate:  62 PR Interval:  116 QRS Duration: 100 QT Interval:  424 QTC Calculation: 430 R Axis:   56 Text Interpretation:  Normal sinus rhythm RSR' or QR pattern in V1  suggests right ventricular conduction delay Borderline ECG Confirmed by  OTTER  MD, OLGA ([id number]) on 02/05/2014 8:11:18 AM        Neta Ehlers, MD 02/05/14 2041

## 2014-02-05 NOTE — Discharge Instructions (Signed)
Your work up here in the ED today was negative for an acute abnormality. You do not appear to be having a stroke or heart attack at this time. It is possible that your blurred vision was from a TIA (see below) amongst other causes of blurred vision, however your stroke risk scores are very low risk. Please follow up with your primary care doctor as soon as possible regarding your ongoing hypertension.  Read the information below regarding your diagnosis and reasons to seek immediate medical care at the Emergency department.  Your caregiver has diagnosed you as having chest pain that is not specific for one problem, but does not require admission.  You are at low risk for an acute heart condition or other serious illness. Chest pain comes from many different causes.  SEEK IMMEDIATE MEDICAL ATTENTION IF: You have severe chest pain, especially if the pain is crushing or pressure-like and spreads to the arms, back, neck, or jaw, or if you have sweating, nausea (feeling sick to your stomach), or shortness of breath. THIS IS AN EMERGENCY. Don't wait to see if the pain will go away. Get medical help at once. Call 911 or [phone] (operator). DO NOT drive yourself to the hospital.  Your chest pain gets worse and does not go away with rest.  You have an attack of chest pain lasting longer than usual, despite rest and treatment with the medications your caregiver has prescribed.  You wake from sleep with chest pain or shortness of breath.  You feel dizzy or faint.  You have chest pain not typical of your usual pain for which you originally saw your caregiver.   Blurred Vision You have been seen today complaining of blurred vision. This means you have a loss of ability to see small details.  CAUSES  Blurred vision can be a symptom of underlying eye problems, such as:  Aging of the eye (presbyopia).  Glaucoma.  Cataracts.  Eye infection.  Eye-related migraine.  Diabetes mellitus.  Fatigue.  Migraine  headaches.  High blood pressure.  Breakdown of the back of the eye (macular degeneration).  Problems caused by some medications. The most common cause of blurred vision is the need for eyeglasses or a new prescription. Today in the emergency department, no cause for your blurred vision can be found. SYMPTOMS  Blurred vision is the loss of visual sharpness and detail (acuity). DIAGNOSIS  Should blurred vision continue, you should see your caregiver. If your caregiver is your primary care physician, he or she may choose to refer you to another specialist.  TREATMENT  Do not ignore your blurred vision. Make sure to have it checked out to see if further treatment or referral is necessary. SEEK MEDICAL CARE IF:  You are unable to get into a specialist so we can help you with a referral. SEEK IMMEDIATE MEDICAL CARE IF: You have severe eye pain, severe headache, or sudden loss of vision. MAKE SURE YOU:   Understand these instructions.  Will watch your condition.  Will get help right away if you are not doing well or get worse. Document Released: 07/11/2003 Document Revised: 09/30/2011 Document Reviewed: 02/10/2008 [facility] Patient Information 2015 [place], [place]. This information is not intended to replace advice given to you by your health care provider. Make sure you discuss any questions you have with your health care provider.  Hypertension Hypertension, commonly called high blood pressure, is when the force of blood pumping through your arteries is too strong. Your arteries are the  blood vessels that carry blood from your heart throughout your body. A blood pressure reading consists of a higher number over a lower number, such as 110/72. The higher number (systolic) is the pressure inside your arteries when your heart pumps. The lower number (diastolic) is the pressure inside your arteries when your heart relaxes. Ideally you want your blood pressure below 120/80. Hypertension forces  your heart to work harder to pump blood. Your arteries may become narrow or stiff. Having hypertension puts you at risk for heart disease, stroke, and other problems.  RISK FACTORS Some risk factors for high blood pressure are controllable. Others are not.  Risk factors you cannot control include:   Race. You may be at higher risk if you are African American.  Age. Risk increases with age.  Gender. Men are at higher risk than women before age 43 years. After age 49, women are at higher risk than men. Risk factors you can control include:  Not getting enough exercise or physical activity.  Being overweight.  Getting too much fat, sugar, calories, or salt in your diet.  Drinking too much alcohol. SIGNS AND SYMPTOMS Hypertension does not usually cause signs or symptoms. Extremely high blood pressure (hypertensive crisis) may cause headache, anxiety, shortness of breath, and nosebleed. DIAGNOSIS  To check if you have hypertension, your health care provider will measure your blood pressure while you are seated, with your arm held at the level of your heart. It should be measured at least twice using the same arm. Certain conditions can cause a difference in blood pressure between your right and left arms. A blood pressure reading that is higher than normal on one occasion does not mean that you need treatment. If one blood pressure reading is high, ask your health care provider about having it checked again. TREATMENT  Treating high blood pressure includes making lifestyle changes and possibly taking medication. Living a healthy lifestyle can help lower high blood pressure. You may need to change some of your habits. Lifestyle changes may include:  Following the DASH diet. This diet is high in fruits, vegetables, and whole grains. It is low in salt, red meat, and added sugars.  Getting at least 2 1/2 hours of brisk physical activity every week.  Losing weight if necessary.  Not  smoking.  Limiting alcoholic beverages.  Learning ways to reduce stress. If lifestyle changes are not enough to get your blood pressure under control, your health care provider may prescribe medicine. You may need to take more than one. Work closely with your health care provider to understand the risks and benefits. HOME CARE INSTRUCTIONS  Have your blood pressure rechecked as directed by your health care provider.   Only take medicine as directed by your health care provider. Follow the directions carefully. Blood pressure medicines must be taken as prescribed. The medicine does not work as well when you skip doses. Skipping doses also puts you at risk for problems.   Do not smoke.   Monitor your blood pressure at home as directed by your health care provider. SEEK MEDICAL CARE IF:   You think you are having a reaction to medicines taken.  You have recurrent headaches or feel dizzy.  You have swelling in your ankles.  You have trouble with your vision. SEEK IMMEDIATE MEDICAL CARE IF:  You develop a severe headache or confusion.  You have unusual weakness, numbness, or feel faint.  You have severe chest or abdominal pain.  You vomit repeatedly.  You have trouble breathing. MAKE SURE YOU:   Understand these instructions.  Will watch your condition.  Will get help right away if you are not doing well or get worse. Document Released: 07/08/2005 Document Revised: 07/13/2013 Document Reviewed: 04/30/2013 [facility] Patient Information 2015 [place], [place]. This information is not intended to replace advice given to you by your health care provider. Make sure you discuss any questions you have with your health care provider.  Managing Your High Blood Pressure Blood pressure is a measurement of how forceful your blood is pressing against the walls of the arteries. Arteries are muscular tubes within the circulatory system. Blood pressure does not stay the same. Blood  pressure rises when you are active, excited, or nervous; and it lowers during sleep and relaxation. If the numbers measuring your blood pressure stay above normal most of the time, you are at risk for health problems. High blood pressure (hypertension) is a long-term (chronic) condition in which blood pressure is elevated. A blood pressure reading is recorded as two numbers, such as 120 over 80 (or 120/80). The first, higher number is called the systolic pressure. It is a measure of the pressure in your arteries as the heart beats. The second, lower number is called the diastolic pressure. It is a measure of the pressure in your arteries as the heart relaxes between beats.  Keeping your blood pressure in a normal range is important to your overall health and prevention of health problems, such as heart disease and stroke. When your blood pressure is uncontrolled, your heart has to work harder than normal. High blood pressure is a very common condition in adults because blood pressure tends to rise with age. Men and women are equally likely to have hypertension but at different times in life. Before age 106, men are more likely to have hypertension. After 60 years of age, women are more likely to have it. Hypertension is especially common in African Americans. This condition often has no signs or symptoms. The cause of the condition is usually not known. Your caregiver can help you come up with a plan to keep your blood pressure in a normal, healthy range. BLOOD PRESSURE STAGES Blood pressure is classified into four stages: normal, prehypertension, stage 1, and stage 2. Your blood pressure reading will be used to determine what type of treatment, if any, is necessary. Appropriate treatment options are tied to these four stages:  Normal  Systolic pressure (mm Hg): below 120.  Diastolic pressure (mm Hg): below 80. Prehypertension  Systolic pressure (mm Hg): 120 to 139.  Diastolic pressure (mm Hg): 80 to  89. Stage1  Systolic pressure (mm Hg): 140 to 159.  Diastolic pressure (mm Hg): 90 to 99. Stage2  Systolic pressure (mm Hg): 160 or above.  Diastolic pressure (mm Hg): 100 or above. RISKS RELATED TO HIGH BLOOD PRESSURE Managing your blood pressure is an important responsibility. Uncontrolled high blood pressure can lead to:  A heart attack.  A stroke.  A weakened blood vessel (aneurysm).  Heart failure.  Kidney damage.  Eye damage.  Metabolic syndrome.  Memory and concentration problems. HOW TO MANAGE YOUR BLOOD PRESSURE Blood pressure can be managed effectively with lifestyle changes and medicines (if needed). Your caregiver will help you come up with a plan to bring your blood pressure within a normal range. Your plan should include the following: Education  Read all information provided by your caregivers about how to control blood pressure.  Educate yourself on the latest guidelines  and treatment recommendations. New research is always being done to further define the risks and treatments for high blood pressure. Lifestylechanges  Control your weight.  Avoid smoking.  Stay physically active.  Reduce the amount of salt in your diet.  Reduce stress.  Control any chronic conditions, such as high cholesterol or diabetes.  Reduce your alcohol intake. Medicines  Several medicines (antihypertensive medicines) are available, if needed, to bring blood pressure within a normal range. Communication  Review all the medicines you take with your caregiver because there may be side effects or interactions.  Talk with your caregiver about your diet, exercise habits, and other lifestyle factors that may be contributing to high blood pressure.  See your caregiver regularly. Your caregiver can help you create and adjust your plan for managing high blood pressure. RECOMMENDATIONS FOR TREATMENT AND FOLLOW-UP  The following recommendations are based on current  guidelines for managing high blood pressure in nonpregnant adults. Use these recommendations to identify the proper follow-up period or treatment option based on your blood pressure reading. You can discuss these options with your caregiver.  Systolic pressure of 502 to 774 or diastolic pressure of 80 to 89: Follow up with your caregiver as directed.  Systolic pressure of 128 to 786 or diastolic pressure of 90 to 100: Follow up with your caregiver within 2 months.  Systolic pressure above 767 or diastolic pressure above 209: Follow up with your caregiver within 1 month.  Systolic pressure above 470 or diastolic pressure above 962: Consider antihypertensive therapy; follow up with your caregiver within 1 week.  Systolic pressure above 836 or diastolic pressure above 629: Begin antihypertensive therapy; follow up with your caregiver within 1 week. Document Released: 04/01/2012 Document Reviewed: 04/01/2012 [facility] Patient Information 2015 [place]. This information is not intended to replace advice given to you by your health care provider. Make sure you discuss any questions you have with your health care provider. Transient Ischemic Attack A transient ischemic attack (TIA) is a "warning stroke" that causes stroke-like symptoms. Unlike a stroke, a TIA does not cause permanent damage to the brain. The symptoms of a TIA can happen very fast and do not last long. It is important to know the symptoms of a TIA and what to do. This can help prevent a major stroke or death. CAUSES   A TIA is caused by a temporary blockage in an artery in the brain or neck (carotid artery). The blockage does not allow the brain to get the blood supply it needs and can cause different symptoms. The blockage can be caused by either:  A blood clot.  Fatty buildup (plaque) in a neck or brain artery. RISK FACTORS  High blood pressure (hypertension).  High cholesterol.  Diabetes mellitus.  Heart  disease.  The build up of plaque in the blood vessels (peripheral artery disease or atherosclerosis).  The build up of plaque in the blood vessels providing blood and oxygen to the brain (carotid artery stenosis).  An abnormal heart rhythm (atrial fibrillation).  Obesity.  Smoking.  Taking oral contraceptives (especially in combination with smoking).  Physical inactivity.  A diet high in fats, salt (sodium), and calories.  Alcohol use.  Use of illegal drugs (especially cocaine and methamphetamine).  Being male.  Being African American.  Being over the age of 5.  Family history of stroke.  Previous history of blood clots, stroke, TIA, or heart attack.  Sickle cell disease. SYMPTOMS  TIA symptoms are the same as a stroke  but are temporary. These symptoms usually develop suddenly, or may be newly present upon awakening from sleep:  Sudden weakness or numbness of the face, arm, or leg, especially on one side of the body.  Sudden trouble walking or difficulty moving arms or legs.  Sudden confusion.  Sudden personality changes.  Trouble speaking (aphasia) or understanding.  Difficulty swallowing.  Sudden trouble seeing in one or both eyes.  Double vision.  Dizziness.  Loss of balance or coordination.  Sudden severe headache with no known cause.  Trouble reading or writing.  Loss of bowel or bladder control.  Loss of consciousness. DIAGNOSIS  Your caregiver may be able to determine the presence or absence of a TIA based on your symptoms, history, and physical exam. Computed tomography (CT scan) of the brain is usually performed to help identify a TIA. Other tests may be done to diagnose a TIA. These tests may include:  Electrocardiography.  Continuous heart monitoring.  Echocardiography.  Carotid ultrasonography.  Magnetic resonance imaging (MRI).  A scan of the brain circulation.  Blood tests. PREVENTION  The risk of a TIA can be decreased by  appropriately treating high blood pressure, high cholesterol, diabetes, heart disease, and obesity and by quitting smoking, limiting alcohol, and staying physically active. TREATMENT  Time is of the essence. Since the symptoms of TIA are the same as a stroke, it is important to seek treatment as soon as possible because you may need a medicine to dissolve the clot (thrombolytic) that cannot be given if too much time has passed. Treatment options vary. Treatment options may include rest, oxygen, intravenous (IV) fluids, and medicines to thin the blood (anticoagulants). Medicines and diet may be used to address diabetes, high blood pressure, and other risk factors. Measures will be taken to prevent short-term and long-term complications, including infection from breathing foreign material into the lungs (aspiration pneumonia), blood clots in the legs, and falls. Treatment options include procedures to either remove plaque in the carotid arteries or dilate carotid arteries that have narrowed due to plaque. Those procedures are:  Carotid endarterectomy.  Carotid angioplasty and stenting. HOME CARE INSTRUCTIONS   Take all medicines prescribed by your caregiver. Follow the directions carefully. Medicines may be used to control risk factors for a stroke. Be sure you understand all your medicine instructions.  You may be told to take aspirin or the anticoagulant warfarin. Warfarin needs to be taken exactly as instructed.  Taking too much or too little warfarin is dangerous. Too much warfarin increases the risk of bleeding. Too little warfarin continues to allow the risk for blood clots. While taking warfarin, you will need to have regular blood tests to measure your blood clotting time. A PT blood test measures how long it takes for blood to clot. Your PT is used to calculate another value called an INR. Your PT and INR help your caregiver to adjust your dose of warfarin. The dose can change for many reasons.  It is critically important that you take warfarin exactly as prescribed.  Many foods, especially foods high in vitamin K can interfere with warfarin and affect the PT and INR. Foods high in vitamin K include spinach, kale, broccoli, cabbage, collard and turnip greens, brussels sprouts, peas, cauliflower, seaweed, and parsley as well as beef and pork liver, green tea, and soybean oil. You should eat a consistent amount of foods high in vitamin K. Avoid major changes in your diet, or notify your caregiver before changing your diet. Arrange a visit  with a dietitian to answer your questions.  Many medicines can interfere with warfarin and affect the PT and INR. You must tell your caregiver about any and all medicines you take, this includes all vitamins and supplements. Be especially cautious with aspirin and anti-inflammatory medicines. Do not take or discontinue any prescribed or over-the-counter medicine except on the advice of your caregiver or pharmacist.  Warfarin can have side effects, such as excessive bruising or bleeding. You will need to hold pressure over cuts for longer than usual. Your caregiver or pharmacist will discuss other potential side effects.  Avoid sports or activities that may cause injury or bleeding.  Be mindful when shaving, flossing your teeth, or handling sharp objects.  Alcohol can change the body's ability to handle warfarin. It is best to avoid alcoholic drinks or consume only very small amounts while taking warfarin. Notify your caregiver if you change your alcohol intake.  Notify your dentist or other caregivers before procedures.  Eat a diet that includes 5 or more servings of fruits and vegetables each day. This may reduce the risk of stroke. Certain diets may be prescribed to address high blood pressure, high cholesterol, diabetes, or obesity.  A low-sodium, low-saturated fat, low-trans fat, low-cholesterol diet is recommended to manage high blood pressure.  A  low-saturated fat, low-trans fat, low-cholesterol, and high-fiber diet may control cholesterol levels.  A controlled-carbohydrate, controlled-sugar diet is recommended to manage diabetes.  A reduced-calorie, low-sodium, low-saturated fat, low-trans fat, low-cholesterol diet is recommended to manage obesity.  Maintain a healthy weight.  Stay physically active. It is recommended that you get at least 30 minutes of activity on most or all days.  Do not smoke.  Limit alcohol use even if you are not taking warfarin. Moderate alcohol use is considered to be:  No more than 2 drinks each day for men.  No more than 1 drink each day for nonpregnant women.  Stop drug abuse.  Home safety. A safe home environment is important to reduce the risk of falls. Your caregiver may arrange for specialists to evaluate your home. Having grab bars in the bedroom and bathroom is often important. Your caregiver may arrange for equipment to be used at home, such as raised toilets and a seat for the shower.  Follow all instructions for follow-up with your caregiver. This is very important. This includes any referrals and lab tests. Proper follow up can prevent a stroke or another TIA from occurring. SEEK MEDICAL CARE IF:  You have personality changes.  You have difficulty swallowing.  You are seeing double.  You have dizziness.  You have a fever.  You have skin breakdown. SEEK IMMEDIATE MEDICAL CARE IF:  Any of these symptoms may represent a serious problem that is an emergency. Do not wait to see if the symptoms will go away. Get medical help right away. Call your local emergency services (911 in [place] Do not drive yourself to the hospital.  You have sudden weakness or numbness of the face, arm, or leg, especially on one side of the body.  You have sudden trouble walking or difficulty moving arms or legs.  You have sudden confusion.  You have trouble speaking (aphasia) or understanding.  You have  sudden trouble seeing in one or both eyes.  You have a loss of balance or coordination.  You have a sudden, severe headache with no known cause.  You have new chest pain or an irregular heartbeat.  You have a partial or total loss  of consciousness. MAKE SURE YOU:   Understand these instructions.  Will watch your condition.  Will get help right away if you are not doing well or get worse. Document Released: 04/17/2005 Document Revised: 07/13/2013 Document Reviewed: 08/31/2009 [facility] Patient Information 2015 [place], [place]. This information is not intended to replace advice given to you by your health care provider. Make sure you discuss any questions you have with your health care provider.

## 2014-02-05 NOTE — ED Notes (Signed)
Pt states that he has had his BP med changed to metoprolol 50mg  BID to Bystolic 10mg  once a day.

## 2014-02-05 NOTE — ED Notes (Signed)
The pt is c/o some chest tightness  Tonight and his bp has been higher than normal

## 2014-02-10 ENCOUNTER — Telehealth: Payer: Self-pay | Admitting: Internal Medicine

## 2014-02-10 NOTE — Telephone Encounter (Signed)
Patient Information:  Caller Name: Marden Noble  Phone: [phone]  Patient: Terry Hansen, Terry Hansen  Gender: Male  DOB: 06/01/1956  Age: 58 Years  PCP: Shawna Orleans Doe-Hyun Herbie [place]) (Adults only)  Office Follow Up:  Does the office need to follow up with this patient?: Yes  Instructions For The Office: YES PLEASE SEE REASON FOR CALL & SYMPTOMS AND FOLLOW UP WITH PATIENT. THANK YOU.  RN Note:  Patient was advised to be seen today in office. Patient does not have access to a car today and would be unable to be seen due to transportation issues. Patient states he is willing and able to be seen in the AM. [place]. THANK YOU.  Symptoms  Reason For Call & Symptoms: Patient's BP meds were recently changed to Bystolic. Patient was instructed to monitor BP and Puls at home and if pulse < 60 then cut doese of Bystolic 10mg  in half to 5mg . Patient now questions whether or not  to descrease Bystolic to half of the original dose as instructed for a one time pulse reading of 54 this morning Patient states he overall does not feel well today and describes himself as "a little lethargic"  Reviewed Health History In EMR: Yes  Reviewed Medications In EMR: Yes  Reviewed Allergies In EMR: Yes  Reviewed Surgeries / Procedures: Yes  Date of Onset of Symptoms: 02/10/2014  Guideline(s) Used:  Weakness (Generalized) and Fatigue  Disposition Per Guideline:   See Today in Office  Reason For Disposition Reached:   Taking a medicine that could cause weakness (e.g., blood pressure medications, diuretics)  Advice Given:  Call Back If:  Unable to stand or walk  Passes out  Breathing difficulty occurs  You become worse.  Patient Will Follow Care Advice:  YES

## 2014-02-10 NOTE — Telephone Encounter (Signed)
Advised pt that Dr Shawna Orleans was not in the office this week.  He agreed to see Dr Maudie Mercury.  Appt scheduled for 9 am 02/11/14

## 2014-02-11 ENCOUNTER — Ambulatory Visit (INDEPENDENT_AMBULATORY_CARE_PROVIDER_SITE_OTHER): Payer: No Typology Code available for payment source | Admitting: Family Medicine

## 2014-02-11 ENCOUNTER — Encounter: Payer: Self-pay | Admitting: Family Medicine

## 2014-02-11 VITALS — BP 130/70 | HR 70 | Temp 98.0°F | Ht 71.0 in | Wt 185.0 lb

## 2014-02-11 DIAGNOSIS — I10 Essential (primary) hypertension: Secondary | ICD-10-CM

## 2014-02-11 NOTE — Patient Instructions (Signed)
-  check you blood pressure and pulse after sitting calmly for 5 minutes about 3 times per week  -keep a log - let Dr. Shawna Orleans know if any concerns  -follow up with Dr. Shawna Orleans in 1 month

## 2014-02-11 NOTE — Progress Notes (Signed)
No chief complaint on file.   HPI:  Acute visit for HTN:  HTN in setting of CAD, HLD: -meds: lisinopril 10, nebivolol 10, statin, asa -reports nebivolol recently added by PCP because metoprolol was making him tired and told to decrease dose if heart rate <60 -had pulse of 58 once at home -denies: CP, SOB, swelling, palpitations, syncope, DOE, lightheaded with exercise or standing  ROS: See pertinent positives and negatives per HPI.  Past Medical History  Diagnosis Date  . Hypertension   . Ocular migraine   . CAD (coronary artery disease)     a.  LHC 06/26/12:  pLAD 90%, prox/mid/dist CFS 20-30%, oRCA 100% (non dom), EF 55%;  b. Echo 12/13:  EF 55-60%, Gr 1 diast dysfn;  c. s/p CABG R[facility] Hockey) 12/13: L-LAD/Dx    . HLD (hyperlipidemia)     Past Surgical History  Procedure Laterality Date  . Appendectomy    . Nasal septum surgery      For uncontrolled epistaxis early 2013  . Coronary artery bypass graft  06/29/2012    Procedure: CORONARY ARTERY BYPASS GRAFTING (CABG);  Surgeon: Melrose Nakayama, MD;  Location: [facility];  Service: Open Heart Surgery;  Laterality: N/A;  times two using Left Internal Mammary Artery    Family History  Problem Relation Age of Onset  . Heart disease Brother     CABG age 97, redo bypass ~50  . Heart disease Paternal Grandmother   . Heart disease Cousin     Maternal side  . Heart attack Brother   . Colon cancer Maternal Grandfather     History   Social History  . Marital Status: Divorced    Spouse Name: N/A    Number of Children: N/A  . Years of Education: N/A   Social History Main Topics  . Smoking status: Never Smoker   . Smokeless tobacco: Never Used  . Alcohol Use: Yes     Comment: 1 drink/month  . Drug Use: No  . Sexual Activity: None   Other Topics Concern  . None   Social History Narrative   Divorced 7 years ago   3 children   Works in Aeronautical engineer    Current outpatient prescriptions:aspirin EC 81 MG  tablet, Take 1 tablet (81 mg total) by mouth daily., Disp: , Rfl: ;  atorvastatin (LIPITOR) 40 MG tablet, Take 40 mg by mouth daily., Disp: , Rfl: ;  Coenzyme Q10 200 MG capsule, Take 200 mg by mouth every morning. disontinue while in hospital, Disp: , Rfl: ;  ibuprofen (ADVIL,MOTRIN) 200 MG tablet, Take 600 mg by mouth every 6 (six) hours as needed. migraine headache, Disp: , Rfl:  lisinopril (PRINIVIL,ZESTRIL) 10 MG tablet, Take 10 mg by mouth daily., Disp: , Rfl: ;  nebivolol (BYSTOLIC) 10 MG tablet, Take 1 tablet (10 mg total) by mouth daily., Disp: 30 tablet, Rfl: 2;  nitroGLYCERIN (NITROSTAT) 0.4 MG SL tablet, Place 1 tablet (0.4 mg total) under the tongue every 5 (five) minutes as needed for chest pain., Disp: 25 tablet, Rfl: 3  EXAM:  Filed Vitals:   02/11/14 0902  BP: 130/70  Pulse: 70  Temp: 98 F (36.7 C)    Body mass index is 25.81 kg/(m^2).  GENERAL: vitals reviewed and listed above, alert, oriented, appears well hydrated and in no acute distress  HEENT: atraumatic, conjunttiva clear, no obvious abnormalities on inspection of external nose and ears  NECK: no obvious masses on inspection  LUNGS: clear to auscultation bilaterally,  no wheezes, rales or rhonchi, good air movement  CV: HRRR, no peripheral edema  MS: moves all extremities without noticeable abnormality  PSYCH: pleasant and cooperative, no obvious depression or anxiety  ASSESSMENT AND PLAN:  Discussed the following assessment and plan:  Essential hypertension  -BP and pulse great here -continue to monitor on 10mg  dose and follow up in 1 month -Patient advised to return or notify a doctor immediately if symptoms worsen or persist or new concerns arise.  Patient Instructions  -check you blood pressure and pulse after sitting calmly for 5 minutes about 3 times per week  -keep a log - let Dr. Shawna Orleans know if any concerns  -follow up with Dr. Shawna Orleans in 1 month     Terry Hansen, Nickola Major.

## 2014-02-11 NOTE — Progress Notes (Signed)
Pre visit review using our clinic review tool, if applicable. No additional management support is needed unless otherwise documented below in the visit note. 

## 2014-03-08 ENCOUNTER — Ambulatory Visit: Payer: No Typology Code available for payment source | Admitting: Internal Medicine

## 2014-03-08 ENCOUNTER — Encounter: Payer: Self-pay | Admitting: Internal Medicine

## 2014-03-17 ENCOUNTER — Encounter: Payer: Self-pay | Admitting: Internal Medicine

## 2014-03-17 ENCOUNTER — Ambulatory Visit (INDEPENDENT_AMBULATORY_CARE_PROVIDER_SITE_OTHER): Payer: No Typology Code available for payment source | Admitting: Internal Medicine

## 2014-03-17 VITALS — BP 131/82 | HR 85 | Temp 97.9°F | Ht 71.0 in | Wt 188.0 lb

## 2014-03-17 DIAGNOSIS — I10 Essential (primary) hypertension: Secondary | ICD-10-CM

## 2014-03-17 MED ORDER — NEBIVOLOL HCL 10 MG PO TABS: 10.0000 mg | ORAL_TABLET | Freq: Every day | ORAL | Status: DC

## 2014-03-17 NOTE — Progress Notes (Signed)
Subjective:    Patient ID: Terry Hansen, male    DOB: August 05, 1955, 58 y.o.   MRN: [id number]  HPI  59 year old white male with history of coronary artery disease, hypertension and hyperlipidemia for routine followup. Patient reports feeling much better since switching from metoprolol to Bystolic.  He has been monitoring his blood pressure and pulse at home. Systolic blood pressure readings range from 785Y systolic to 850Y. He recently decreased his Bystolic from 10 mg to 5 mg after noticing mild bradycardia (2 days ago). His heart rate decreased into the low 60s.  Patient is asymptomatic bradycardia. He denies any dizziness or lightheadedness.  Review of Systems Negative for chest pain, negative for shortness of breath     Past Medical History  Diagnosis Date  . Hypertension   . Ocular migraine   . CAD (coronary artery disease)     a.  LHC 06/26/12:  pLAD 90%, prox/mid/dist CFS 20-30%, oRCA 100% (non dom), EF 55%;  b. Echo 12/13:  EF 55-60%, Gr 1 diast dysfn;  c. s/p CABG Roxan Hockey) 12/13: L-LAD/Dx    . HLD (hyperlipidemia)     History   Social History  . Marital Status: Divorced    Spouse Name: N/A    Number of Children: N/A  . Years of Education: N/A   Occupational History  . Not on file.   Social History Main Topics  . Smoking status: Never Smoker   . Smokeless tobacco: Never Used  . Alcohol Use: Yes     Comment: 1 drink/month  . Drug Use: No  . Sexual Activity: Not on file   Other Topics Concern  . Not on file   Social History Narrative   Divorced 7 years ago   3 children   Works in Aeronautical engineer    Past Surgical History  Procedure Laterality Date  . Appendectomy    . Nasal septum surgery      For uncontrolled epistaxis early 2013  . Coronary artery bypass graft  06/29/2012    Procedure: CORONARY ARTERY BYPASS GRAFTING (CABG);  Surgeon: Melrose Nakayama, MD;  Location: [place] [place];  Service: Open Heart Surgery;  Laterality: N/A;  times two using  Left Internal Mammary Artery    Family History  Problem Relation Age of Onset  . Heart disease Brother     CABG age 13, redo bypass ~50  . Heart disease Paternal Grandmother   . Heart disease Cousin     Maternal side  . Heart attack Brother   . Colon cancer Maternal Grandfather     No Known Allergies  Current Outpatient Prescriptions on File Prior to Visit  Medication Sig Dispense Refill  . aspirin EC 81 MG tablet Take 1 tablet (81 mg total) by mouth daily.      Marland Kitchen atorvastatin (LIPITOR) 40 MG tablet Take 40 mg by mouth daily.      . Coenzyme Q10 200 MG capsule Take 200 mg by mouth every morning. disontinue while in hospital      . ibuprofen (ADVIL,MOTRIN) 200 MG tablet Take 600 mg by mouth every 6 (six) hours as needed. migraine headache      . lisinopril (PRINIVIL,ZESTRIL) 10 MG tablet Take 10 mg by mouth daily.      . nitroGLYCERIN (NITROSTAT) 0.4 MG SL tablet Place 1 tablet (0.4 mg total) under the tongue every 5 (five) minutes as needed for chest pain.  25 tablet  3   No current facility-administered medications on file prior to  visit.    BP 131/82  Pulse 85  Temp(Src) 97.9 F (36.6 C) (Oral)  Ht 5\' 11"  (1.803 m)  Wt 188 lb (85.276 kg)  BMI 26.23 kg/m2  SpO2 95%    Objective:   Physical Exam  Constitutional: He is oriented to person, place, and time. He appears well-developed and well-nourished. No distress.  Cardiovascular: Normal rate, regular rhythm and normal heart sounds.   No murmur heard. Pulmonary/Chest: Effort normal and breath sounds normal. He has no wheezes.  Musculoskeletal: He exhibits no edema.  Neurological: He is alert and oriented to person, place, and time. No cranial nerve deficit.  Psychiatric: He has a normal mood and affect. His behavior is normal.          Assessment & Plan:

## 2014-03-17 NOTE — Assessment & Plan Note (Signed)
Complaints of fatigue much improved with switch to Bystolic.  His pulse occasionally in high 50's.  He is asymptomatic.  Continue 10 mg. BP: 131/82 mmHg

## 2014-03-17 NOTE — Progress Notes (Signed)
Pre visit review using our clinic review tool, if applicable. No additional management support is needed unless otherwise documented below in the visit note. 

## 2014-04-24 ENCOUNTER — Other Ambulatory Visit: Payer: Self-pay | Admitting: Internal Medicine

## 2014-06-01 ENCOUNTER — Encounter (HOSPITAL_COMMUNITY): Payer: Self-pay | Admitting: Emergency Medicine

## 2014-06-01 ENCOUNTER — Telehealth: Payer: Self-pay | Admitting: Internal Medicine

## 2014-06-01 ENCOUNTER — Emergency Department (HOSPITAL_COMMUNITY): Payer: BC Managed Care – PPO

## 2014-06-01 ENCOUNTER — Emergency Department (HOSPITAL_COMMUNITY)
Admission: EM | Admit: 2014-06-01 | Discharge: 2014-06-01 | Disposition: A | Discharge status: Home / Self Care | Payer: BC Managed Care – PPO | Attending: Emergency Medicine | Admitting: Emergency Medicine

## 2014-06-01 DIAGNOSIS — Z9889 Other specified postprocedural states: Secondary | ICD-10-CM | POA: Insufficient documentation

## 2014-06-01 DIAGNOSIS — I10 Essential (primary) hypertension: Secondary | ICD-10-CM | POA: Insufficient documentation

## 2014-06-01 DIAGNOSIS — I2510 Atherosclerotic heart disease of native coronary artery without angina pectoris: Secondary | ICD-10-CM | POA: Insufficient documentation

## 2014-06-01 DIAGNOSIS — E785 Hyperlipidemia, unspecified: Secondary | ICD-10-CM | POA: Diagnosis not present

## 2014-06-01 DIAGNOSIS — Z951 Presence of aortocoronary bypass graft: Secondary | ICD-10-CM | POA: Diagnosis not present

## 2014-06-01 DIAGNOSIS — R079 Chest pain, unspecified: Secondary | ICD-10-CM

## 2014-06-01 DIAGNOSIS — R531 Weakness: Secondary | ICD-10-CM | POA: Diagnosis not present

## 2014-06-01 DIAGNOSIS — Z79899 Other long term (current) drug therapy: Secondary | ICD-10-CM | POA: Insufficient documentation

## 2014-06-01 DIAGNOSIS — R072 Precordial pain: Secondary | ICD-10-CM | POA: Insufficient documentation

## 2014-06-01 DIAGNOSIS — G43809 Other migraine, not intractable, without status migrainosus: Secondary | ICD-10-CM | POA: Insufficient documentation

## 2014-06-01 DIAGNOSIS — R5383 Other fatigue: Secondary | ICD-10-CM | POA: Diagnosis not present

## 2014-06-01 DIAGNOSIS — Z7982 Long term (current) use of aspirin: Secondary | ICD-10-CM | POA: Diagnosis not present

## 2014-06-01 DIAGNOSIS — R0789 Other chest pain: Secondary | ICD-10-CM

## 2014-06-01 LAB — CBC WITH DIFFERENTIAL/PLATELET
BASOS PCT: 0 % (ref 0–1)
Basophils Absolute: 0 10*3/uL (ref 0.0–0.1)
EOS PCT: 2 % (ref 0–5)
Eosinophils Absolute: 0.1 10*3/uL (ref 0.0–0.7)
HCT: 44.8 % (ref 39.0–52.0)
Hemoglobin: 15.5 g/dL (ref 13.0–17.0)
LYMPHS PCT: 18 % (ref 12–46)
Lymphs Abs: 1.3 10*3/uL (ref 0.7–4.0)
MCH: 30.1 pg (ref 26.0–34.0)
MCHC: 34.6 g/dL (ref 30.0–36.0)
MCV: 87 fL (ref 78.0–100.0)
Monocytes Absolute: 0.4 10*3/uL (ref 0.1–1.0)
Monocytes Relative: 6 % (ref 3–12)
Neutro Abs: 5.2 10*3/uL (ref 1.7–7.7)
Neutrophils Relative %: 74 % (ref 43–77)
PLATELETS: 229 10*3/uL (ref 150–400)
RBC: 5.15 MIL/uL (ref 4.22–5.81)
RDW: 12.4 % (ref 11.5–15.5)
WBC: 7 10*3/uL (ref 4.0–10.5)

## 2014-06-01 LAB — COMPREHENSIVE METABOLIC PANEL
ALBUMIN: 3.6 g/dL (ref 3.5–5.2)
ALT: 29 U/L (ref 0–53)
AST: 20 U/L (ref 0–37)
Alkaline Phosphatase: 118 U/L — ABNORMAL HIGH (ref 39–117)
Anion gap: 10 (ref 5–15)
BUN: 9 mg/dL (ref 6–23)
CO2: 30 meq/L (ref 19–32)
CREATININE: 0.94 mg/dL (ref 0.50–1.35)
Calcium: 9.3 mg/dL (ref 8.4–10.5)
Chloride: 101 mEq/L (ref 96–112)
GFR calc Af Amer: >90 mL/min (ref >90)
GFR, EST NON AFRICAN AMERICAN: 89 mL/min — AB (ref >90)
Glucose, Bld: 74 mg/dL (ref 70–99)
Potassium: 4.3 mEq/L (ref 3.7–5.3)
SODIUM: 141 meq/L (ref 137–147)
TOTAL PROTEIN: 6.4 g/dL (ref 6.0–8.3)
Total Bilirubin: 0.4 mg/dL (ref 0.3–1.2)

## 2014-06-01 LAB — TROPONIN I

## 2014-06-01 NOTE — ED Notes (Signed)
Pt presents via EMS from work complaining of chest pain x 3 days. Pt took 2 nitro at home with relief but chest pain returned. Pt states he was very tired at work and the chest pain and back pain started to get worse. Pt called doctor and the office recommended him come to ED. No n/v, SOB, dizziness. Pt is very tired. EMS gave pt 1 nitro and 325 of aspirin.

## 2014-06-01 NOTE — Telephone Encounter (Signed)
Pt went to the ER.

## 2014-06-01 NOTE — ED Provider Notes (Signed)
CSN: [id number]     Arrival date & time 06/01/14  1123 History   First MD Initiated Contact with Patient 06/01/14 1150     Chief Complaint  Patient presents with  . Chest Pain     (Consider location/radiation/quality/duration/timing/severity/associated sxs/prior Treatment) Patient is a 58 y.o. male presenting with chest pain. The history is provided by the patient.  Chest Pain Chest pain location: sternum. Pain quality: aching   Pain radiates to:  Upper back Pain radiates to the back: yes   Pain severity:  Mild Onset quality:  Gradual Duration:  3 days Timing:  Intermittent Progression:  Unchanged Chronicity:  Recurrent Context: at rest   Relieved by:  Nothing Worsened by:  Nothing tried Ineffective treatments:  Aspirin and nitroglycerin Associated symptoms: fatigue and weakness (generalized)   Associated symptoms: no abdominal pain, no cough, no fever, no headache, no nausea, no numbness, no shortness of breath and not vomiting     Past Medical History  Diagnosis Date  . Hypertension   . Ocular migraine   . CAD (coronary artery disease)     a.  LHC 06/26/12:  pLAD 90%, prox/mid/dist CFS 20-30%, oRCA 100% (non dom), EF 55%;  b. Echo 12/13:  EF 55-60%, Gr 1 diast dysfn;  c. s/p CABG Roxan Hockey) 12/13: L-LAD/Dx    . HLD (hyperlipidemia)    Past Surgical History  Procedure Laterality Date  . Appendectomy    . Nasal septum surgery      For uncontrolled epistaxis early 2013  . Coronary artery bypass graft  06/29/2012    Procedure: CORONARY ARTERY BYPASS GRAFTING (CABG);  Surgeon: Melrose Nakayama, MD;  Location: [facility];  Service: Open Heart Surgery;  Laterality: N/A;  times two using Left Internal Mammary Artery   Family History  Problem Relation Age of Onset  . Heart disease Brother     CABG age 37, redo bypass ~50  . Heart disease Paternal Grandmother   . Heart disease Cousin     Maternal side  . Heart attack Brother   . Colon cancer Maternal Grandfather     History  Substance Use Topics  . Smoking status: Never Smoker   . Smokeless tobacco: Never Used  . Alcohol Use: Yes     Comment: 1 drink/month    Review of Systems  Constitutional: Positive for fatigue. Negative for fever.  HENT: Negative for drooling and rhinorrhea.   Eyes: Negative for pain.  Respiratory: Negative for cough and shortness of breath.   Cardiovascular: Positive for chest pain. Negative for leg swelling.  Gastrointestinal: Negative for nausea, vomiting, abdominal pain and diarrhea.  Genitourinary: Negative for dysuria and hematuria.  Musculoskeletal: Negative for gait problem and neck pain.  Skin: Negative for color change.  Neurological: Positive for weakness (generalized). Negative for numbness and headaches.  Hematological: Negative for adenopathy.  Psychiatric/Behavioral: Negative for behavioral problems.  All other systems reviewed and are negative.     Allergies  Review of patient's allergies indicates no known allergies.  Home Medications   Prior to Admission medications   Medication Sig Start Date End Date Taking? Authorizing Provider  aspirin EC 81 MG tablet Take 1 tablet (81 mg total) by mouth daily. 07/21/12   Liliane Shi, PA-C  atorvastatin (LIPITOR) 40 MG tablet Take 40 mg by mouth daily.    Historical Provider, MD  BYSTOLIC 10 MG tablet TAKE 1 TABLET BY MOUTH DAILY 04/25/14   Doe-Hyun R Shawna Orleans, DO  Coenzyme Q10 200 MG capsule Take 200  mg by mouth every morning. disontinue while in hospital    Historical Provider, MD  ibuprofen (ADVIL,MOTRIN) 200 MG tablet Take 600 mg by mouth every 6 (six) hours as needed. migraine headache    Historical Provider, MD  lisinopril (PRINIVIL,ZESTRIL) 10 MG tablet Take 10 mg by mouth daily.    Historical Provider, MD  nitroGLYCERIN (NITROSTAT) 0.4 MG SL tablet Place 1 tablet (0.4 mg total) under the tongue every 5 (five) minutes as needed for chest pain. 11/29/13   Josue Hector, MD   BP 114/68 mmHg  Pulse 50   Temp(Src) 97.7 F (36.5 C) (Oral)  Resp 15  Ht 5\' 11"  (1.803 m)  Wt 180 lb (81.647 kg)  BMI 25.12 kg/m2  SpO2 99% Physical Exam  Constitutional: He is oriented to person, place, and time. He appears well-developed and well-nourished.  HENT:  Head: Normocephalic and atraumatic.  Right Ear: External ear normal.  Left Ear: External ear normal.  Nose: Nose normal.  Mouth/Throat: Oropharynx is clear and moist. No oropharyngeal exudate.  Eyes: Conjunctivae and EOM are normal. Pupils are equal, round, and reactive to light.  Neck: Normal range of motion. Neck supple.  Cardiovascular: Normal rate, regular rhythm, normal heart sounds and intact distal pulses.  Exam reveals no gallop and no friction rub.   No murmur heard. Pulmonary/Chest: Effort normal and breath sounds normal. No respiratory distress. He has no wheezes. He exhibits tenderness (mild to mod ttp of sternum).  Abdominal: Soft. Bowel sounds are normal. He exhibits no distension. There is no tenderness. There is no rebound and no guarding.  Musculoskeletal: Normal range of motion. He exhibits no edema or tenderness.  Neurological: He is alert and oriented to person, place, and time.  Skin: Skin is warm and dry.  Psychiatric: He has a normal mood and affect. His behavior is normal.  Nursing note and vitals reviewed.   ED Course  Procedures (including critical care time) Labs Review Labs Reviewed  COMPREHENSIVE METABOLIC PANEL - Abnormal; Notable for the following:    Alkaline Phosphatase 118 (*)    GFR calc non Af Amer 89 (*)    All other components within normal limits  CBC WITH DIFFERENTIAL  TROPONIN I  TROPONIN I    Imaging Review Dg Chest 2 View  06/01/2014   CLINICAL DATA:  Chest pain and dizziness  EXAM: CHEST  2 VIEW  COMPARISON:  01/26/2014  FINDINGS: Previous median sternotomy. The heart size and mediastinal contours are within normal limits. Both lungs are clear. The visualized skeletal structures are  unremarkable.  IMPRESSION: No active cardiopulmonary disease.   Electronically Signed   By: Kerby Moors M.D.   On: 06/01/2014 13:17     EKG Interpretation   Date/Time:  Wednesday June 01 2014 11:29:05 EST Ventricular Rate:  60 PR Interval:  119 QRS Duration: 112 QT Interval:  428 QTC Calculation: 428 R Axis:   50 Text Interpretation:  Sinus rhythm Borderline short PR interval Incomplete  right bundle branch block Low voltage, precordial leads No significant  change since last tracing Confirmed by Hermenia Fritcher  MD, Fernie Grimm ([id number]) on  06/01/2014 12:00:58 PM      MDM   Final diagnoses:  Sternal pain  Other fatigue    12:56 PM 58 y.o. male w hx of CAD s/p CABG who presents with sternal aching and fatigue worsening over the last 3 days. He states that he feels weak all over. He denies any fevers, vomiting, or shortness of breath. He notes  that he has had this sternal pain previously. He is afebrile and vital signs are unremarkable here.we'll get screening labs and imaging. CP currently 1/10. He refuses tx. He states that he took a full aspirin and 2 nitroglycerin earlier. He states that this mildly helped.  Labs/cxr thus far unremarkable. Pt cp free, would like to go home. I offered cards consultation. He would prefer to f/u as outpt. Given the fact that his cp is gone, atypical in that he feels it in his sternum and this area is ttp, in setting of non-contrib workup, I thought this was reasonable despite his cardiac hx.   4:14 PM: Delta trop neg. Pt remains asx.  I have discussed the diagnosis/risks/treatment options with the patient and believe the pt to be eligible for discharge home to follow-up with his cardiologist, call for appt. We also discussed returning to the ED immediately if new or worsening sx occur. We discussed the sx which are most concerning (e.g., return of cp, sob) that necessitate immediate return. Medications administered to the patient during their visit and any  new prescriptions provided to the patient are listed below.  Medications given during this visit Medications - No data to display  New Prescriptions   No medications on file       Pamella Pert, MD 06/01/14 1615

## 2014-06-01 NOTE — Discharge Instructions (Signed)

## 2014-06-01 NOTE — Telephone Encounter (Signed)
Patient Information:  Caller Name: Marden Noble  Phone: [phone]  Patient: Terry Hansen, Terry Hansen  Gender: Male  DOB: [birth year]  Age: 58 Years  PCP: Shawna Orleans Doe-Hyun Herbie [place]) (Adults only)  Office Follow Up:  Does the office need to follow up with this patient?: Yes  Instructions For The Office: Patient is at work.  Mid sternal chest pain x2 days intermittent pain with constant dull ache. Radiating to back.  Nitro x2 .ASA taken Directed to ED/911 Understanding expressed  RN Note:  Patient is at work.  Mid sternal chest pain x2 days intermittent pain with constant dull ache. Radiating to back.  Nitro x2 .ASA taken Directed to ED/911 Understanding expressed.  Symptoms  Reason For Call & Symptoms: Patient states he had CABG  2 years ago.  He states complaint of chest pain x2 days. Intermittent pain but describes constant ache at sterum. "Dull ache". Radiates to back .  No shortness of breath. He describes being lightheaded and lethargic.  He just took a Nitro with no relief and is directed now to take another . Patient states pain is coming back. voice is clear and purposeful. ASA taken.  Reviewed Health History In EMR: Yes  Reviewed Medications In EMR: Yes  Reviewed Allergies In EMR: Yes  Reviewed Surgeries / Procedures: Yes  Date of Onset of Symptoms: 05/30/2014  Treatments Tried: Nitroglycerin  Treatments Tried Worked: Yes  Guideline(s) Used:  Chest Pain  Disposition Per Guideline:   Call EMS 911 Now  Reason For Disposition Reached:   Chest pain lasting longer than 5 minutes and ANY of the following:  Over 26 years old Over 69 years old and at least one cardiac risk factor (i.e., high blood pressure, diabetes, high cholesterol, obesity, smoker or strong family history of heart disease) Pain is crushing, pressure-like, or heavy  Took nitroglycerin and chest pain was not relieved History of heart disease (i.e., angina, heart attack, bypass surgery, angioplasty, CHF)  Advice Given:  Call  Back If:  Severe chest pain  Constant chest pain lasting longer than 5 minutes  Difficulty breathing  You become worse.  RN Overrode Recommendation:  Go To ED  Patient is at work.  Mid sternal chest pain x2 days intermittent pain with constant dull ache. Radiating to back.  Nitro x2 .ASA taken Directed to ED/911 Understanding expressed

## 2014-06-01 NOTE — ED Notes (Signed)
Pt states he noticed in the past that with movement and position change that his pain was muscular, however today he can tell it's "inside of him" and position change doesn't help him. Pt pain free at this time.

## 2014-06-02 ENCOUNTER — Telehealth: Payer: Self-pay | Admitting: Cardiovascular Disease

## 2014-06-02 ENCOUNTER — Encounter (HOSPITAL_COMMUNITY): Payer: Self-pay | Admitting: Emergency Medicine

## 2014-06-02 ENCOUNTER — Observation Stay (HOSPITAL_COMMUNITY)
Admission: EM | Admit: 2014-06-02 | Discharge: 2014-06-03 | Disposition: A | Discharge status: Home / Self Care | Payer: BC Managed Care – PPO | Attending: Cardiovascular Disease | Admitting: Cardiovascular Disease

## 2014-06-02 DIAGNOSIS — Z8249 Family history of ischemic heart disease and other diseases of the circulatory system: Secondary | ICD-10-CM | POA: Insufficient documentation

## 2014-06-02 DIAGNOSIS — R0789 Other chest pain: Secondary | ICD-10-CM | POA: Diagnosis present

## 2014-06-02 DIAGNOSIS — I10 Essential (primary) hypertension: Secondary | ICD-10-CM | POA: Diagnosis not present

## 2014-06-02 DIAGNOSIS — R079 Chest pain, unspecified: Secondary | ICD-10-CM

## 2014-06-02 DIAGNOSIS — K219 Gastro-esophageal reflux disease without esophagitis: Secondary | ICD-10-CM | POA: Diagnosis present

## 2014-06-02 DIAGNOSIS — R072 Precordial pain: Secondary | ICD-10-CM | POA: Diagnosis present

## 2014-06-02 DIAGNOSIS — E782 Mixed hyperlipidemia: Secondary | ICD-10-CM | POA: Diagnosis present

## 2014-06-02 DIAGNOSIS — I200 Unstable angina: Secondary | ICD-10-CM | POA: Diagnosis not present

## 2014-06-02 DIAGNOSIS — Z7982 Long term (current) use of aspirin: Secondary | ICD-10-CM | POA: Diagnosis not present

## 2014-06-02 DIAGNOSIS — I25110 Atherosclerotic heart disease of native coronary artery with unstable angina pectoris: Secondary | ICD-10-CM | POA: Diagnosis not present

## 2014-06-02 HISTORY — DX: Gastro-esophageal reflux disease without esophagitis: K21.9

## 2014-06-02 HISTORY — DX: Unstable angina: I20.0

## 2014-06-02 LAB — BASIC METABOLIC PANEL
ANION GAP: 15 (ref 5–15)
BUN: 10 mg/dL (ref 6–23)
CHLORIDE: 100 meq/L (ref 96–112)
CO2: 25 meq/L (ref 19–32)
Calcium: 9.6 mg/dL (ref 8.4–10.5)
Creatinine, Ser: 0.97 mg/dL (ref 0.50–1.35)
GFR calc non Af Amer: 88 mL/min — ABNORMAL LOW (ref >90)
Glucose, Bld: 95 mg/dL (ref 70–99)
POTASSIUM: 4 meq/L (ref 3.7–5.3)
SODIUM: 140 meq/L (ref 137–147)

## 2014-06-02 LAB — CBC
HCT: 49.8 % (ref 39.0–52.0)
Hemoglobin: 17.4 g/dL — ABNORMAL HIGH (ref 13.0–17.0)
MCH: 30.4 pg (ref 26.0–34.0)
MCHC: 34.9 g/dL (ref 30.0–36.0)
MCV: 86.9 fL (ref 78.0–100.0)
PLATELETS: 252 10*3/uL (ref 150–400)
RBC: 5.73 MIL/uL (ref 4.22–5.81)
RDW: 12.6 % (ref 11.5–15.5)
WBC: 9.4 10*3/uL (ref 4.0–10.5)

## 2014-06-02 LAB — I-STAT TROPONIN, ED
Troponin i, poc: 0 ng/mL (ref 0.00–0.08)
Troponin i, poc: 0 ng/mL (ref 0.00–0.08)

## 2014-06-02 MED ORDER — NEBIVOLOL HCL 5 MG PO TABS
5.0000 mg | ORAL_TABLET | Freq: Every day | ORAL | Status: DC
Start: 2014-06-03 — End: 2014-06-03
  Administered 2014-06-03: 5 mg via ORAL
  Filled 2014-06-02: qty 1

## 2014-06-02 MED ORDER — NITROGLYCERIN 0.4 MG SL SUBL: 0.4000 mg | SUBLINGUAL_TABLET | SUBLINGUAL | Status: DC | PRN

## 2014-06-02 MED ORDER — SODIUM CHLORIDE 0.9 % IJ SOLN
3.0000 mL | Freq: Two times a day (BID) | INTRAMUSCULAR | Status: DC
  Administered 2014-06-02 – 2014-06-03 (×2): 3 mL via INTRAVENOUS

## 2014-06-02 MED ORDER — ACETAMINOPHEN 325 MG PO TABS: 650.0000 mg | ORAL_TABLET | ORAL | Status: DC | PRN

## 2014-06-02 MED ORDER — ASPIRIN EC 81 MG PO TBEC
81.0000 mg | DELAYED_RELEASE_TABLET | Freq: Every day | ORAL | Status: DC
  Filled 2014-06-02: qty 1

## 2014-06-02 MED ORDER — ASPIRIN 300 MG RE SUPP: 300.0000 mg | RECTAL | Status: DC

## 2014-06-02 MED ORDER — PANTOPRAZOLE SODIUM 40 MG PO TBEC
40.0000 mg | DELAYED_RELEASE_TABLET | Freq: Two times a day (BID) | ORAL | Status: DC
  Administered 2014-06-02 – 2014-06-03 (×2): 40 mg via ORAL
  Filled 2014-06-02 (×2): qty 1

## 2014-06-02 MED ORDER — ASPIRIN 81 MG PO CHEW: 324.0000 mg | CHEWABLE_TABLET | ORAL | Status: DC

## 2014-06-02 MED ORDER — ATORVASTATIN CALCIUM 40 MG PO TABS
40.0000 mg | ORAL_TABLET | Freq: Every day | ORAL | Status: DC
  Administered 2014-06-02: 40 mg via ORAL
  Filled 2014-06-02 (×2): qty 1

## 2014-06-02 MED ORDER — SODIUM CHLORIDE 0.9 % IJ SOLN
3.0000 mL | INTRAMUSCULAR | Status: DC | PRN
Start: 2014-06-02 — End: 2014-06-03
  Administered 2014-06-03: 3 mL via INTRAVENOUS
  Filled 2014-06-02: qty 3

## 2014-06-02 MED ORDER — GI COCKTAIL ~~LOC~~
30.0000 mL | Freq: Once | ORAL | Status: AC
  Administered 2014-06-02: 30 mL via ORAL
  Filled 2014-06-02: qty 30

## 2014-06-02 MED ORDER — ASPIRIN EC 81 MG PO TBEC: 81.0000 mg | DELAYED_RELEASE_TABLET | Freq: Every day | ORAL | Status: DC

## 2014-06-02 MED ORDER — ASPIRIN 81 MG PO CHEW
81.0000 mg | CHEWABLE_TABLET | ORAL | Status: AC
  Administered 2014-06-03: 81 mg via ORAL
  Filled 2014-06-02: qty 1

## 2014-06-02 MED ORDER — ANGIOPLASTY BOOK
Freq: Once | Status: AC
  Administered 2014-06-02: 1
  Filled 2014-06-02: qty 1

## 2014-06-02 MED ORDER — SODIUM CHLORIDE 0.9 % IV SOLN: 250.0000 mL | INTRAVENOUS | Status: DC | PRN

## 2014-06-02 MED ORDER — LISINOPRIL 10 MG PO TABS
10.0000 mg | ORAL_TABLET | Freq: Every day | ORAL | Status: DC
Start: 2014-06-03 — End: 2014-06-03
  Administered 2014-06-03: 10 mg via ORAL
  Filled 2014-06-02: qty 1

## 2014-06-02 MED ORDER — EXERCISE FOR HEART AND HEALTH BOOK
Freq: Once | Status: AC
  Administered 2014-06-02: 1
  Filled 2014-06-02 (×2): qty 1

## 2014-06-02 MED ORDER — ASPIRIN 81 MG PO CHEW
243.0000 mg | CHEWABLE_TABLET | Freq: Once | ORAL | Status: AC
  Administered 2014-06-02: 243 mg via ORAL
  Filled 2014-06-02: qty 3

## 2014-06-02 MED ORDER — ACTIVE PARTNERSHIP FOR HEALTH OF YOUR HEART BOOK
Freq: Once | Status: AC
  Administered 2014-06-02: 1
  Filled 2014-06-02: qty 1

## 2014-06-02 MED ORDER — ONDANSETRON HCL 4 MG/2ML IJ SOLN: 4.0000 mg | Freq: Four times a day (QID) | INTRAMUSCULAR | Status: DC | PRN

## 2014-06-02 NOTE — Plan of Care (Signed)
Problem: Phase I Progression Outcomes Goal: Hemodynamically stable Outcome: Completed/Met Date Met:  06/02/14     

## 2014-06-02 NOTE — Telephone Encounter (Signed)
New message     Pt went to ER yesterday with chest pain.  His heart checked out ok--however, pt had bypass surgery 73yrs ago.  This am---still chest pain

## 2014-06-02 NOTE — ED Notes (Signed)
Pt c/o mid sternal CP and through to back; pt seen here yesterday for same and sent home but having continued pain; pt sts had CABG 2 years ago; pt sts pain in intermittent

## 2014-06-02 NOTE — Telephone Encounter (Signed)
Patient phoned c/o having chest pains this morning, pain 2 out of 10 with 10 being most painful Has taken NTG and thinks is may have helped  Seen in ED yesterday for CP for evaluation. Patient stated everything checked out ok but something just is not right Patient c/o decreased energy Discussed with Gerald Stabs B PA and he recommended patient go to ED if having active chest pain Advised patient that if he was having active chest pain needed to go back to the ED, verbalized understanding.

## 2014-06-02 NOTE — ED Notes (Signed)
Cards into see pt

## 2014-06-02 NOTE — ED Provider Notes (Signed)
CSN: [id number]     Arrival date & time 06/02/14  1042 History   First MD Initiated Contact with Patient 06/02/14 1117     Chief Complaint  Patient presents with  . Chest Pain     (Consider location/radiation/quality/duration/timing/severity/associated sxs/prior Treatment) HPI  Deago Burruss is a 58 y.o. male with PMH of CABG 2 years ago presenting with with recurrent mid sternal burning chest pain. He was seen in ED yesterday for chest pain. He had negative troponins and was sent home with follow-up with cardiology. He started having similar chest pain that started at 2 AM last night resolved after 30 minutes. He had another episode of chest pain around 7 or 8 this morning that resolved after 30 minutes. Patient was at rest. He has not eaten anything. He denies deep breathing, eating, exertion making the pain worse. He noted lightheadedness, generalized weakness, nausea, "not feeling right". No diaphoresis, emesis, SOB or leg swelling, dyspnea. Called cardiology today who told him to come to the ED. He is in no pain in ED. Patient has had chest pain since his surgery 2 years ago but it was associated with movement. Was different than the pain he is having today. He has taken a 81mg  aspirin today and no other medication.    Past Medical History  Diagnosis Date  . Hypertension   . CAD (coronary artery disease)     a.  LHC 06/26/12:  pLAD 90%, prox/mid/dist CFS 20-30%, oRCA 100% (non dom), EF 55%;  b. Echo 12/13:  EF 55-60%, Gr 1 diast dysfn;  c. s/p CABG Roxan Hockey) 12/13: L-LAD/Dx    . HLD (hyperlipidemia)   . GERD (gastroesophageal reflux disease)   . Ocular migraine     "maybe once q couple months" (06/02/2014)   Past Surgical History  Procedure Laterality Date  . Nasal septum surgery      For uncontrolled epistaxis early 2013  . Coronary artery bypass graft  06/29/2012    Procedure: CORONARY ARTERY BYPASS GRAFTING (CABG);  Surgeon: Melrose Nakayama, MD;  Location: [facility];   Service: Open Heart Surgery;  Laterality: N/A;  times two using Left Internal Mammary Artery  . Appendectomy  1970's  . Inguinal hernia repair Bilateral [phone]  . Cardiac catheterization  06/2012   Family History  Problem Relation Age of Onset  . Heart disease Brother     CABG age 1, redo bypass ~50  . Heart disease Paternal Grandmother   . Heart disease Cousin     Maternal side  . Heart attack Brother   . Colon cancer Maternal Grandfather    History  Substance Use Topics  . Smoking status: Never Smoker   . Smokeless tobacco: Never Used  . Alcohol Use: 0.6 oz/week    1 Glasses of wine per week    Review of Systems  Constitutional: Negative for fever and chills.  HENT: Negative for congestion and rhinorrhea.   Eyes: Negative for visual disturbance.  Respiratory: Negative for cough and shortness of breath.   Cardiovascular: Positive for chest pain. Negative for leg swelling.  Gastrointestinal: Positive for nausea. Negative for vomiting and diarrhea.  Genitourinary: Negative for dysuria and hematuria.  Musculoskeletal: Negative for back pain and gait problem.  Skin: Negative for rash.  Neurological: Negative for weakness and headaches.      Allergies  Review of patient's allergies indicates no known allergies.  Home Medications   Prior to Admission medications   Medication Sig Start Date End Date Taking? Authorizing Provider  aspirin EC 81 MG tablet Take 1 tablet (81 mg total) by mouth daily. 07/21/12  Yes Scott Joylene Draft, PA-C  atorvastatin (LIPITOR) 40 MG tablet Take 40 mg by mouth daily.   Yes Historical Provider, MD  BYSTOLIC 10 MG tablet TAKE 1 TABLET BY MOUTH DAILY 04/25/14  Yes Doe-Hyun R Yoo, DO  Coenzyme Q10 200 MG capsule Take 200 mg by mouth every morning. disontinue while in hospital   Yes Historical Provider, MD  ibuprofen (ADVIL,MOTRIN) 200 MG tablet Take 600 mg by mouth every 6 (six) hours as needed. migraine headache   Yes Historical Provider, MD   lisinopril (PRINIVIL,ZESTRIL) 10 MG tablet Take 10 mg by mouth daily.   Yes Historical Provider, MD  nitroGLYCERIN (NITROSTAT) 0.4 MG SL tablet Place 1 tablet (0.4 mg total) under the tongue every 5 (five) minutes as needed for chest pain. 11/29/13  Yes Josue Hector, MD   BP 131/74 mmHg  Pulse 54  Temp(Src) 98 F (36.7 C) (Oral)  Resp 18  SpO2 99% Physical Exam  Constitutional: He appears well-developed and well-nourished. No distress.  HENT:  Head: Normocephalic and atraumatic.  Eyes: Conjunctivae and EOM are normal. Right eye exhibits no discharge. Left eye exhibits no discharge.  Cardiovascular: Normal rate, regular rhythm and normal heart sounds.   No leg swelling or tenderness. Negative Homan's sign. No chest wall tenderness.  Pulmonary/Chest: Effort normal and breath sounds normal. No respiratory distress. He has no wheezes.  Abdominal: Soft. Bowel sounds are normal. He exhibits no distension. There is no tenderness.  Neurological: He is alert. He exhibits normal muscle tone. Coordination normal.  Skin: Skin is warm and dry. He is not diaphoretic.  Nursing note and vitals reviewed.   ED Course  Procedures (including critical care time) Labs Review Labs Reviewed  BASIC METABOLIC PANEL - Abnormal; Notable for the following:    GFR calc non Af Amer 88 (*)    All other components within normal limits  CBC - Abnormal; Notable for the following:    Hemoglobin 17.4 (*)    All other components within normal limits  BASIC METABOLIC PANEL  I-STAT TROPOININ, ED  Randolm Idol, ED    Imaging Review Dg Chest 2 View  06/01/2014   CLINICAL DATA:  Chest pain and dizziness  EXAM: CHEST  2 VIEW  COMPARISON:  01/26/2014  FINDINGS: Previous median sternotomy. The heart size and mediastinal contours are within normal limits. Both lungs are clear. The visualized skeletal structures are unremarkable.  IMPRESSION: No active cardiopulmonary disease.   Electronically Signed   By: Kerby Moors M.D.   On: 06/01/2014 13:17     EKG Interpretation   Date/Time:  Thursday June 02 2014 10:44:19 EST Ventricular Rate:  63 PR Interval:  116 QRS Duration: 114 QT Interval:  404 QTC Calculation: 413 R Axis:   -11 Text Interpretation:  Normal sinus rhythm Incomplete right bundle branch  block Borderline ECG since last tracing no significant change Confirmed by  Eulis Foster  MD, ELLIOTT ([id number]) on 06/02/2014 11:17:46 AM      MDM   Final diagnoses:  Other chest pain   Concern for cardiac etiology of Chest Pain. Cardiology has been consulted and will see patient in the ED for likely admit. Pt with atypical chest pain but his pain before CABG was atypical.  Pt has been re-evaluated prior to consult and VSS, NAD, heart RRR, pain 0/10, lungs CTAB. Pt given full dose aspirin in ED. No acute abnormalities found on  EKG and first round of cardiac enzymes negative. This case was discussed with Dr. Eulis Foster who has seen the patient and agrees with plan to admit. Dr. Johnsie Cancel seen patient with plan for cath due to circumflex and RCA not being grafted.  Discussed return precautions with patient. Discussed all results and patient verbalizes understanding and agrees with plan.  This is a shared patient. This patient was discussed with the physician, Dr. Eulis Foster who saw and evaluated the patient and agrees with the plan.       Pura Spice, PA-C 06/02/14 [place], MD 06/08/14 [phone]

## 2014-06-02 NOTE — ED Notes (Signed)
Waiting on cards dr to come and see

## 2014-06-02 NOTE — ED Provider Notes (Signed)
  Face-to-face evaluation   History: intermittent chest pain for 2 days, both pressure-like and burning sensation in nature.  He has associated belching.  Symptoms present for several days.  Prior evaluation last evening with obstipation and troponin.  Today he called his cardiologist to arrange follow-up, but they told him to come here, instead.  He has had several episodes of ER evaluations since his CABG, 2 years ago, for various problems.  Physical exam:alert, calm, cooperative.  No respiratory distress.  Neurologic exam is normal.  Medical screening examination/treatment/procedure(s) were conducted as a shared visit with non-physician practitioner(s) and myself.  I personally evaluated the patient during the encounter  Richarda Blade, MD 06/08/14 1022

## 2014-06-02 NOTE — H&P (Signed)
Physician History and Physical     Patient ID: Terry Hansen MRN: [id number] DOB/AGE: 1956/05/22 58 y.o. Admit date: 06/02/2014  Primary Care Physician: Drema Pry, DO Primary Cardiologist:  Johnsie Cancel  Active Problems:   * No active hospital problems. *   HPI:   58 yo with known CAD.  Seen in ER for SSCP.  R/O no acute ECG changes normal CXR  D/C home  Called office with recurrent pains and told to come to ER.  12/13 had CABG with Dr Roxan Hockey.  Complex LAD/D1 disease.  Had sequential LIMA to LAD/D1.  Non dominant RCA and circumflex not grafted.  Interestingly did not have classic symptoms then  Had URI, migraine , nausea and fatigue.  Has had SSCP mid radiating to both sides Associated with nausea Sometimes exertional  No great help with nitro.  Occasionally positional but different than muscular post CABG pains.  Denies history of GERD, PUD or esophageal disease.  Compliant with meds  CRF premature family history with brother CABG in 30's  , HTN and elevated lipids.  No recent URI, vomiting, diarea or other associated GI symptoms  Have asked nurse to give him GI cocktail  No dyspnea palpitations or syncope Previous EF normal  Still having atypical pains in ER   Review of systems complete and found to be negative unless listed above   Past Medical History  Diagnosis Date  . Hypertension   . Ocular migraine   . CAD (coronary artery disease)     a.  LHC 06/26/12:  pLAD 90%, prox/mid/dist CFS 20-30%, oRCA 100% (non dom), EF 55%;  b. Echo 12/13:  EF 55-60%, Gr 1 diast dysfn;  c. s/p CABG Roxan Hockey) 12/13: L-LAD/Dx    . HLD (hyperlipidemia)     Family History  Problem Relation Age of Onset  . Heart disease Brother     CABG age 1, redo bypass ~50  . Heart disease Paternal Grandmother   . Heart disease Cousin     Maternal side  . Heart attack Brother   . Colon cancer Maternal Grandfather     History   Social History  . Marital Status: Divorced    Spouse Name: N/A    Number of  Children: N/A  . Years of Education: N/A   Occupational History  . Not on file.   Social History Main Topics  . Smoking status: Never Smoker   . Smokeless tobacco: Never Used  . Alcohol Use: Yes     Comment: 1 drink/month  . Drug Use: No  . Sexual Activity: Not on file   Other Topics Concern  . Not on file   Social History Narrative   Divorced 7 years ago   3 children   Works in Aeronautical engineer    Past Surgical History  Procedure Laterality Date  . Appendectomy    . Nasal septum surgery      For uncontrolled epistaxis early 2013  . Coronary artery bypass graft  06/29/2012    Procedure: CORONARY ARTERY BYPASS GRAFTING (CABG);  Surgeon: Melrose Nakayama, MD;  Location: [facility];  Service: Open Heart Surgery;  Laterality: N/A;  times two using Left Internal Mammary Artery      (Not in a hospital admission)  Physical Exam: Blood pressure 137/71, pulse 55, temperature 98 F (36.7 C), temperature source Oral, resp. rate 21, SpO2 97 %.   Affect appropriate Healthy:  appears stated age 65: normal Neck supple with no adenopathy JVP normal no bruits no  thyromegaly Lungs clear with no wheezing and good diaphragmatic motion Heart:  S1/S2 no murmur, no rub, gallop or click Post sternotomy  PMI normal Abdomen: benighn, BS positve, no tenderness, no AAA no bruit.  No HSM or HJR Distal pulses intact with no bruits No edema Neuro non-focal Skin warm and dry No muscular weakness  No current facility-administered medications on file prior to encounter.   Current Outpatient Prescriptions on File Prior to Encounter  Medication Sig Dispense Refill  . aspirin EC 81 MG tablet Take 1 tablet (81 mg total) by mouth daily.    Marland Kitchen atorvastatin (LIPITOR) 40 MG tablet Take 40 mg by mouth daily.    Marland Kitchen BYSTOLIC 10 MG tablet TAKE 1 TABLET BY MOUTH DAILY 30 tablet 5  . Coenzyme Q10 200 MG capsule Take 200 mg by mouth every morning. disontinue while in hospital    . ibuprofen  (ADVIL,MOTRIN) 200 MG tablet Take 600 mg by mouth every 6 (six) hours as needed. migraine headache    . lisinopril (PRINIVIL,ZESTRIL) 10 MG tablet Take 10 mg by mouth daily.    . nitroGLYCERIN (NITROSTAT) 0.4 MG SL tablet Place 1 tablet (0.4 mg total) under the tongue every 5 (five) minutes as needed for chest pain. 25 tablet 3    Labs:   Lab Results  Component Value Date   WBC 9.4 06/02/2014   HGB 17.4* 06/02/2014   HCT 49.8 06/02/2014   MCV 86.9 06/02/2014   PLT 252 06/02/2014    Recent Labs Lab 06/01/14 1200 06/02/14 1059  NA 141 140  K 4.3 4.0  CL 101 100  CO2 30 25  BUN 9 10  CREATININE 0.94 0.97  CALCIUM 9.3 9.6  PROT 6.4  --   BILITOT 0.4  --   ALKPHOS 118*  --   ALT 29  --   AST 20  --   GLUCOSE 74 95   Lab Results  Component Value Date   CKTOTAL 129 01/31/2014   CKTOTAL 39 03/01/2013   TROPONINI <0.30 06/01/2014   TROPONINI <0.30 06/01/2014   TROPONINI <0.30 02/05/2014     Lab Results  Component Value Date   CHOL 104 11/30/2013   CHOL 98 09/01/2012   CHOL 145 06/26/2012   Lab Results  Component Value Date   HDL 34.80* 11/30/2013   HDL 34.70* 09/01/2012   HDL 40 06/26/2012   Lab Results  Component Value Date   LDLCALC 58 11/30/2013   LDLCALC 48 09/01/2012   LDLCALC 88 06/26/2012   Lab Results  Component Value Date   TRIG 55.0 11/30/2013   TRIG 78.0 09/01/2012   TRIG 83 06/26/2012   Lab Results  Component Value Date   CHOLHDL 3 11/30/2013   CHOLHDL 3 09/01/2012   CHOLHDL 3.6 06/26/2012   No results found for: LDLDIRECT     Radiology: Dg Chest 2 View  06/01/2014   CLINICAL DATA:  Chest pain and dizziness  EXAM: CHEST  2 VIEW  COMPARISON:  01/26/2014  FINDINGS: Previous median sternotomy. The heart size and mediastinal contours are within normal limits. Both lungs are clear. The visualized skeletal structures are unremarkable.  IMPRESSION: No active cardiopulmonary disease.   Electronically Signed   By: Kerby Moors M.D.   On:  06/01/2014 13:17    EKG:  Today SR [id number] no acute change from 06/01/14    ASSESSMENT AND PLAN:   Chest Pain:  Atypical but recurrent requiring 2 ER visits in 48 hrs.  Previous "angina" very atypical  as well before CABG and nausea common to both.  Will give GI cocktail and start proton pump inhibitor Admit for cath since circumflex and RCA not grafted.  Also higher incidence of graft failure when LIMA used as jump graft.  No heparin for now Continue aspirin and beta blocker HTN:  Stable continue ACE and beta blocker Chol:  On statin check LFTls  GERD:  ?  Start proton pump inhibitor and antacids  Consider GI consult for EGD if coronary arteries ok   Signed: Collier Salina Nishan11/06/2014, 1:50 PM

## 2014-06-03 ENCOUNTER — Encounter (HOSPITAL_COMMUNITY)
Admission: EM | Disposition: A | Discharge status: Home / Self Care | Payer: Self-pay | Source: Home / Self Care | Attending: Cardiovascular Disease

## 2014-06-03 ENCOUNTER — Encounter (HOSPITAL_COMMUNITY): Payer: Self-pay | Admitting: Interventional Cardiology

## 2014-06-03 DIAGNOSIS — I10 Essential (primary) hypertension: Secondary | ICD-10-CM | POA: Diagnosis not present

## 2014-06-03 DIAGNOSIS — I25110 Atherosclerotic heart disease of native coronary artery with unstable angina pectoris: Secondary | ICD-10-CM

## 2014-06-03 DIAGNOSIS — I200 Unstable angina: Secondary | ICD-10-CM | POA: Diagnosis not present

## 2014-06-03 DIAGNOSIS — E782 Mixed hyperlipidemia: Secondary | ICD-10-CM | POA: Diagnosis not present

## 2014-06-03 DIAGNOSIS — K219 Gastro-esophageal reflux disease without esophagitis: Secondary | ICD-10-CM | POA: Diagnosis not present

## 2014-06-03 HISTORY — PX: LEFT HEART CATHETERIZATION WITH CORONARY/GRAFT ANGIOGRAM: SHX5450

## 2014-06-03 LAB — BASIC METABOLIC PANEL
ANION GAP: 12 (ref 5–15)
BUN: 8 mg/dL (ref 6–23)
CHLORIDE: 102 meq/L (ref 96–112)
CO2: 26 meq/L (ref 19–32)
CREATININE: 1.03 mg/dL (ref 0.50–1.35)
Calcium: 8.9 mg/dL (ref 8.4–10.5)
GFR calc Af Amer: 89 mL/min — ABNORMAL LOW (ref >90)
GFR calc non Af Amer: 77 mL/min — ABNORMAL LOW (ref >90)
GLUCOSE: 90 mg/dL (ref 70–99)
Potassium: 4.1 mEq/L (ref 3.7–5.3)
Sodium: 140 mEq/L (ref 137–147)

## 2014-06-03 LAB — PROTIME-INR
INR: 1.17 (ref 0.00–1.49)
PROTHROMBIN TIME: 15 s (ref 11.6–15.2)

## 2014-06-03 SURGERY — LEFT HEART CATHETERIZATION WITH CORONARY/GRAFT ANGIOGRAM
Anesthesia: LOCAL

## 2014-06-03 MED ORDER — MIDAZOLAM HCL 2 MG/2ML IJ SOLN
INTRAMUSCULAR | Status: AC
  Filled 2014-06-03: qty 2

## 2014-06-03 MED ORDER — HEPARIN (PORCINE) IN NACL 2-0.9 UNIT/ML-% IJ SOLN
INTRAMUSCULAR | Status: AC
  Filled 2014-06-03: qty 1000

## 2014-06-03 MED ORDER — FENTANYL CITRATE 0.05 MG/ML IJ SOLN
INTRAMUSCULAR | Status: AC
  Filled 2014-06-03: qty 2

## 2014-06-03 MED ORDER — LIDOCAINE HCL (PF) 1 % IJ SOLN
INTRAMUSCULAR | Status: AC
  Filled 2014-06-03: qty 30

## 2014-06-03 MED ORDER — ACETAMINOPHEN 325 MG PO TABS: 650.0000 mg | ORAL_TABLET | ORAL | Status: DC | PRN

## 2014-06-03 MED ORDER — ATORVASTATIN CALCIUM 40 MG PO TABS: 40.0000 mg | ORAL_TABLET | Freq: Every day | ORAL | Status: DC

## 2014-06-03 MED ORDER — ONDANSETRON HCL 4 MG/2ML IJ SOLN: 4.0000 mg | Freq: Four times a day (QID) | INTRAMUSCULAR | Status: DC | PRN

## 2014-06-03 MED ORDER — NITROGLYCERIN 1 MG/10 ML FOR IR/CATH LAB
INTRA_ARTERIAL | Status: AC
  Filled 2014-06-03: qty 10

## 2014-06-03 MED ORDER — ATORVASTATIN CALCIUM 40 MG PO TABS
40.0000 mg | ORAL_TABLET | Freq: Every day | ORAL | Status: DC
  Administered 2014-06-03: 40 mg via ORAL
  Filled 2014-06-03: qty 1

## 2014-06-03 MED ORDER — PANTOPRAZOLE SODIUM 40 MG PO TBEC: 40.0000 mg | DELAYED_RELEASE_TABLET | Freq: Two times a day (BID) | ORAL | Status: DC

## 2014-06-03 MED ORDER — SODIUM CHLORIDE 0.9 % IV SOLN
1.0000 mL/kg/h | INTRAVENOUS | Status: AC
  Administered 2014-06-03: 1 mL/kg/h via INTRAVENOUS

## 2014-06-03 NOTE — Interval H&P Note (Signed)
Cath Lab Visit (complete for each Cath Lab visit)  Clinical Evaluation Leading to the Procedure:   ACS: Yes.    Non-ACS:    Anginal Classification: CCS IV  Anti-ischemic medical therapy: Minimal Therapy (1 class of medications)  Non-Invasive Test Results: No non-invasive testing performed  Prior CABG: Previous CABG      History and Physical Interval Note:  06/03/2014 12:15 PM  Terry Hansen  has presented today for surgery, with the diagnosis of cp  The various methods of treatment have been discussed with the patient and family. After consideration of risks, benefits and other options for treatment, the patient has consented to  Procedure(s): LEFT HEART CATHETERIZATION WITH CORONARY/GRAFT ANGIOGRAM (N/A) as a surgical intervention .  The patient's history has been reviewed, patient examined, no change in status, stable for surgery.  I have reviewed the patient's chart and labs.  Questions were answered to the patient's satisfaction.     Terry Hansen S.

## 2014-06-03 NOTE — Progress Notes (Signed)
Appropriate Use Criteria Documentation  TIMI SCORE  Patient Information:  TIMI Score is 3  UA/NSTEMI and intermediate-risk features (e.g., TIMI score 3?4) for short-term risk of death or nonfatal MI  Revascularization of the presumed culprit artery   A (9)  Indication: 10; Score: 9  Terry Barbar S.

## 2014-06-03 NOTE — Progress Notes (Signed)
5 fr sheath remove from rt femoral artery and hemostasis was achieved with 15 minutes direct pressure.  Groin level on before and after sheath removal.  Distal pulses easily palpatable. Instructions given and voiced back. Dressing applied.   Transported to 3 [place].

## 2014-06-03 NOTE — Discharge Instructions (Signed)
PLEASE REMEMBER TO BRING ALL OF YOUR MEDICATIONS TO EACH OF YOUR FOLLOW-UP OFFICE VISITS. ° °PLEASE ATTEND ALL SCHEDULED FOLLOW-UP APPOINTMENTS.  ° °Activity: Increase activity slowly as tolerated. You may shower, but no soaking baths (or swimming) for 1 week. No driving for 2 days. No lifting over 5 lbs for 1 week. No sexual activity for 1 week.  ° °You May Return to Work: in 1 week (if applicable) ° °Wound Care: You may wash cath site gently with soap and water. Keep cath site clean and dry. If you notice pain, swelling, bleeding or pus at your cath site, please call [phone]. ° ° ° °Cardiac Cath Site Care °Refer to this sheet in the next few weeks. These instructions provide you with information on caring for yourself after your procedure. Your caregiver may also give you more specific instructions. Your treatment has been planned according to current medical practices, but problems sometimes occur. Call your caregiver if you have any problems or questions after your procedure. °HOME CARE INSTRUCTIONS °· You may shower 24 hours after the procedure. Remove the bandage (dressing) and gently wash the site with plain soap and water. Gently pat the site dry.  °· Do not apply powder or lotion to the site.  °· Do not sit in a bathtub, swimming pool, or whirlpool for 5 to 7 days.  °· No bending, squatting, or lifting anything over 10 pounds (4.5 kg) as directed by your caregiver.  °· Inspect the site at least twice daily.  °· Do not drive home if you are discharged the same day of the procedure. Have someone else drive you.  °· You may drive 24 hours after the procedure unless otherwise instructed by your caregiver.  °What to expect: °· Any bruising will usually fade within 1 to 2 weeks.  °· Blood that collects in the tissue (hematoma) may be painful to the touch. It should usually decrease in size and tenderness within 1 to 2 weeks.  °SEEK IMMEDIATE MEDICAL CARE IF: °· You have unusual pain at the site or down the  affected limb.  °· You have redness, warmth, swelling, or pain at the site.  °· You have drainage (other than a small amount of blood on the dressing).  °· You have chills.  °· You have a fever or persistent symptoms for more than 72 hours.  °· You have a fever and your symptoms suddenly get worse.  °· Your leg becomes pale, cool, tingly, or numb.  °· You have heavy bleeding from the site. Hold pressure on the site.  °Document Released: 08/10/2010 Document Revised: 06/27/2011 Document Reviewed: 08/10/2010 °ExitCare® Patient Information ©2012 [employer]. ° °Cardiac Diet °This diet can help prevent heart disease and stroke. Many factors influence your heart health, including eating and exercise habits. Coronary risk rises a lot with abnormal blood fat (lipid) levels. Cardiac meal planning includes limiting unhealthy fats, increasing healthy fats, and making other small dietary changes. General guidelines are as follows: °· Adjust calorie intake to reach and maintain desirable body weight. °· Limit total fat intake to less than 30% of total calories. Saturated fat should be less than 7% of calories. °· Saturated fats are found in animal products and in some vegetable products. Saturated vegetable fats are found in coconut oil, cocoa butter, palm oil, and palm kernel oil. Read labels carefully to avoid these products as much as possible. Use butter in moderation. Choose tub margarines and oils that have 2 grams of fat or less.   Good cooking oils are canola and olive oils. °· Practice low-fat cooking techniques. Do not fry food. Instead, broil, bake, boil, steam, grill, roast on a rack, stir-fry, or microwave it. Other fat reducing suggestions include: °¨ Remove the skin from poultry. °¨ Remove all visible fat from meats. °¨ Skim the fat off stews, soups, and gravies before serving them. °¨ Steam vegetables in water or broth instead of sautéing them in fat. °· Avoid foods with trans fat (or hydrogenated oils), such as  commercially fried foods and commercially baked goods. Commercial shortening and deep-frying fats will contain trans fat. °· Increase intake of fruits, vegetables, whole grains, and legumes to replace foods high in fat. °¨ Increase consumption of nuts, legumes, and seeds to at least 4 servings weekly. One serving of a legume equals ½ cup, and 1 serving of nuts or seeds equals ¼ cup. °¨ Choose whole grains more often. Have 3 servings per day (a serving is 1 ounce [oz]). °¨ Eat 4 to 5 servings of vegetables per day. A serving of vegetables is 1 cup of raw leafy vegetables; ½ cup of raw or cooked cut-up vegetables; ½ cup of vegetable juice. °¨ Eat 4 to 5 servings of fruit per day. A serving of fruit is 1 medium whole fruit; ¼ cup of dried fruit; ½ cup of fresh, frozen, or canned fruit; ½ cup of 100% fruit juice. °· Increase your intake of dietary fiber to 20 to 30 grams per day. Insoluble fiber may help lower your risk of heart disease and may help curb your appetite.  °Soluble fiber binds cholesterol to be removed from the blood. Foods high in soluble fiber are dried beans, citrus fruits, oats, apples, bananas, broccoli, Brussels sprouts, and eggplant. °· Try to include foods fortified with plant sterols or stanols, such as yogurt, breads, juices, or margarines. Choose several fortified foods to achieve a daily intake of 2 to 3 grams of plant sterols or stanols. °· Foods with omega-3 fats can help reduce your risk of heart disease. Aim to have a 3.5 oz portion of fatty fish twice per week, such as salmon, mackerel, albacore tuna, sardines, lake trout, or herring. If you wish to take a fish oil supplement, choose one that contains 1 gram of both DHA and EPA. °· Limit processed meats to 2 servings (3 oz portion) weekly. °· Limit the sodium in your diet to 1500 milligrams (mg) per day. If you have high blood pressure, talk to a registered dietitian about a DASH (Dietary Approaches to Stop Hypertension) eating  plan. °· Limit sweets and beverages with added sugar, such as soda, to no more than 5 servings per week. One serving is:   °¨ 1 tablespoon sugar. °¨ 1 tablespoon jelly or jam. °¨ ½ cup sorbet. °¨ 1 cup lemonade. °¨ ½ cup regular soda. °CHOOSING FOODS °Starches °· Allowed: Breads: All kinds (wheat, rye, raisin, white, oatmeal, Italian, French, and English muffin bread). Low-fat rolls: English muffins, frankfurter and hamburger buns, bagels, pita bread, tortillas (not fried). Pancakes, waffles, biscuits, and muffins made with recommended oil. °· Avoid: Products made with saturated or trans fats, oils, or whole milk products. Butter rolls, cheese breads, croissants. Commercial doughnuts, muffins, sweet rolls, biscuits, waffles, pancakes, store-bought mixes. °Crackers °· Allowed: Low-fat crackers and snacks: Animal, graham, rye, saltine (with recommended oil, no lard), oyster, and matzo crackers. Bread sticks, melba toast, rusks, flatbread, pretzels, and light popcorn. °· Avoid: High-fat crackers: cheese crackers, butter crackers, and those made with coconut, palm oil,   or trans fat (hydrogenated oils). Buttered popcorn. °Cereals °· Allowed: Hot or cold whole-grain cereals. °· Avoid: Cereals containing coconut, hydrogenated vegetable fat, or animal fat. °Potatoes / Pasta / Rice °· Allowed: All kinds of potatoes, rice, and pasta (such as macaroni, spaghetti, and noodles). °· Avoid: Pasta or rice prepared with cream sauce or high-fat cheese. Chow mein noodles, French fries. °Vegetables °· Allowed: All vegetables and vegetable juices. °· Avoid: Fried vegetables. Vegetables in cream, butter, or high-fat cheese sauces. Limit coconut. Fruit in cream or custard. °Protein °· Allowed: Limit your intake of meat, seafood, and poultry to no more than 6 oz (cooked weight) per day. All lean, well-trimmed beef, veal, pork, and lamb. All chicken and turkey without skin. All fish and shellfish. Wild game: wild duck, rabbit, pheasant, and  venison. Egg whites or low-cholesterol egg substitutes may be used as desired. Meatless dishes: recipes with dried beans, peas, lentils, and tofu (soybean curd). Seeds and nuts: all seeds and most nuts. °· Avoid: Prime grade and other heavily marbled and fatty meats, such as short ribs, spare ribs, rib eye roast or steak, frankfurters, sausage, bacon, and high-fat luncheon meats, mutton. Caviar. Commercially fried fish. Domestic duck, goose, venison sausage. Organ meats: liver, gizzard, heart, chitterlings, brains, kidney, sweetbreads. °Dairy °· Allowed: Low-fat cheeses: nonfat or low-fat cottage cheese (1% or 2% fat), cheeses made with part skim milk, such as mozzarella, farmers, string, or ricotta. (Cheeses should be labeled no more than 2 to 6 grams fat per oz.). Skim (or 1%) milk: liquid, powdered, or evaporated. Buttermilk made with low-fat milk. Drinks made with skim or low-fat milk or cocoa. Chocolate milk or cocoa made with skim or low-fat (1%) milk. Nonfat or low-fat yogurt. °· Avoid: Whole milk cheeses, including colby, cheddar, muenster, Monterey Jack, Havarti, Brie, Camembert, American, Swiss, and blue. Creamed cottage cheese, cream cheese. Whole milk and whole milk products, including buttermilk or yogurt made from whole milk, drinks made from whole milk. Condensed milk, evaporated whole milk, and 2% milk. °Soups and Combination Foods °· Allowed: Low-fat low-sodium soups: broth, dehydrated soups, homemade broth, soups with the fat removed, homemade cream soups made with skim or low-fat milk. Low-fat spaghetti, lasagna, chili, and Spanish rice if low-fat ingredients and low-fat cooking techniques are used. °· Avoid: Cream soups made with whole milk, cream, or high-fat cheese. All other soups. °Desserts and Sweets °· Allowed: Sherbet, fruit ices, gelatins, meringues, and angel food cake. Homemade desserts with recommended fats, oils, and milk products. Jam, jelly, honey, marmalade, sugars, and syrups.  Pure sugar candy, such as gum drops, hard candy, jelly beans, marshmallows, mints, and small amounts of dark chocolate. °· Avoid: Commercially prepared cakes, pies, cookies, frosting, pudding, or mixes for these products. Desserts containing whole milk products, chocolate, coconut, lard, palm oil, or palm kernel oil. Ice cream or ice cream drinks. Candy that contains chocolate, coconut, butter, hydrogenated fat, or unknown ingredients. Buttered syrups. °Fats and Oils °· Allowed: Vegetable oils: safflower, sunflower, corn, soybean, cottonseed, sesame, canola, olive, or peanut. Non-hydrogenated margarines. Salad dressing or mayonnaise: homemade or commercial, made with a recommended oil. Low or nonfat salad dressing or mayonnaise. °¨ Limit added fats and oils to 6 to 8 tsp per day (includes fats used in cooking, baking, salads, and spreads on bread). Remember to count the "hidden fats" in foods. °· Avoid: Solid fats and shortenings: butter, lard, salt pork, bacon drippings. Gravy containing meat fat, shortening, or suet. Cocoa butter, coconut. Coconut oil, palm oil, palm kernel oil,   or hydrogenated oils: these ingredients are often used in bakery products, nondairy creamers, whipped toppings, candy, and commercially fried foods. Read labels carefully. Salad dressings made of unknown oils, sour cream, or cheese, such as blue cheese and Roquefort. Cream, all kinds: half-and-half, light, heavy, or whipping. Sour cream or cream cheese (even if "light" or low-fat). Nondairy cream substitutes: coffee creamers and sour cream substitutes made with palm, palm kernel, hydrogenated oils, or coconut oil. °Beverages °· Allowed: Coffee (regular or decaffeinated), tea. Diet carbonated beverages, mineral water. Alcohol: Check with your caregiver. Moderation is recommended. °· Avoid: Whole milk, regular sodas, and juice drinks with added sugar. °Condiments °· Allowed: All seasonings and condiments. Cocoa powder. "Cream" sauces made  with recommended ingredients. °· Avoid: Carob powder made with hydrogenated fats. °SAMPLE MENU °Breakfast °· ½ cup orange juice °· ½ cup oatmeal °· 1 slice toast °· 1 tsp margarine °· 1 cup skim milk °Lunch °· Turkey sandwich with 2 oz turkey, 2 slices bread °· Lettuce and tomato slices °· Fresh fruit °· Carrot sticks °· Coffee or tea °Snack °· Fresh fruit or low-fat crackers °Dinner °· 3 oz lean ground beef °· 1 baked potato °· 1 tsp margarine °· ½ cup asparagus °· Lettuce salad °· 1 tbs non-creamy dressing °· ½ cup peach slices °· 1 cup skim milk °Document Released: 04/16/2008 Document Revised: 01/07/2012 Document Reviewed: 09/07/2013 °ExitCare® Patient Information ©2015 [employer]. This information is not intended to replace advice given to you by your health care provider. Make sure you discuss any questions you have with your health care provider. ° ° ° °

## 2014-06-03 NOTE — H&P (View-Only) (Signed)
Patient ID: Terry Hansen, male   DOB: 08/09/1955, 58 y.o.   MRN: [id number]    Subjective:  Denies SSCP, palpitations or Dyspnea   Objective:  Filed Vitals:   06/02/14 1615 06/02/14 1645 06/02/14 2013 06/03/14 0551  BP: 128/91 131/74 110/70 111/75  Pulse: 63 54 54 62  Temp:   98 F (36.7 C) 97.9 F (36.6 C)  TempSrc:   Oral Oral  Resp: 14 18 18 18   Height:   5\' 11"  (1.803 m)   Weight:   81.647 kg (180 lb) 81.693 kg (180 lb 1.6 oz)  SpO2: 97% 99% 97% 97%    Intake/Output from previous day:  Intake/Output Summary (Last 24 hours) at 06/03/14 0913 Last data filed at 06/03/14 7903  Gross per 24 hour  Intake    243 ml  Output    700 ml  Net   -457 ml    Physical Exam: Affect appropriate Healthy:  appears stated age HEENT: normal Neck supple with no adenopathy JVP normal no bruits no thyromegaly Lungs clear with no wheezing and good diaphragmatic motion Heart:  S1/S2 no murmur, no rub, gallop or click PMI normal Abdomen: benighn, BS positve, no tenderness, no AAA no bruit.  No HSM or HJR Distal pulses intact with no bruits No edema Neuro non-focal Skin warm and dry No muscular weakness   Lab Results: Basic Metabolic Panel:  Recent Labs  06/02/14 1059 06/03/14 0538  NA 140 140  K 4.0 4.1  CL 100 102  CO2 25 26  GLUCOSE 95 90  BUN 10 8  CREATININE 0.97 1.03  CALCIUM 9.6 8.9   Liver Function Tests:  Recent Labs  06/01/14 1200  AST 20  ALT 29  ALKPHOS 118*  BILITOT 0.4  PROT 6.4  ALBUMIN 3.6   CBC:  Recent Labs  06/01/14 1200 06/02/14 1059  WBC 7.0 9.4  NEUTROABS 5.2  --   HGB 15.5 17.4*  HCT 44.8 49.8  MCV 87.0 86.9  PLT 229 252   Cardiac Enzymes:  Recent Labs  06/01/14 1200 06/01/14 1519  TROPONINI <0.30 <0.30    Imaging: Dg Chest 2 View  06/01/2014   CLINICAL DATA:  Chest pain and dizziness  EXAM: CHEST  2 VIEW  COMPARISON:  01/26/2014  FINDINGS: Previous median sternotomy. The heart size and mediastinal contours are  within normal limits. Both lungs are clear. The visualized skeletal structures are unremarkable.  IMPRESSION: No active cardiopulmonary disease.   Electronically Signed   By: Kerby Moors M.D.   On: 06/01/2014 13:17    Cardiac Studies:  ECG:  NSR no acute ST/ Twave changes   Telemetry:  NSR no arrhythmia  Echo:   Medications:   . aspirin EC  81 mg Oral Daily  . atorvastatin  40 mg Oral Daily  . lisinopril  10 mg Oral Daily  . nebivolol  5 mg Oral Daily  . pantoprazole  40 mg Oral BID  . sodium chloride  3 mL Intravenous Q12H       Assessment/Plan:  HTN:  Well controlled continue ACE and beta blocker Chol: continue statin target LDL less than 70 GERD:  Some relief of pain with GI cocktail in ER d/c with proton pump inhibitor  If cath non obstructive consider outpatient EGD CAD:  Chest pain recurrent with 2 ER visits in 2 days.  RCA and circ not bypassed  LIMA jump graft LAD/D1  Cath today  Jenkins Rouge 06/03/2014, 9:13 AM

## 2014-06-03 NOTE — Discharge Summary (Signed)
CARDIOLOGY DISCHARGE SUMMARY   Patient ID: Terry Hansen MRN: [id number] DOB/AGE: September 15, 1955 58 y.o.  Admit date: 06/02/2014 Discharge date: 06/03/2014  PCP: Drema Pry, DO Primary Cardiologist: Dr. Johnsie Cancel  Primary Discharge Diagnosis:   Chest pain, mid sternal Secondary Discharge Diagnosis:    Mixed hyperlipidemia   Precordial pain   HTN (hypertension)   GERD (gastroesophageal reflux disease)   Unstable angina pectoris  Procedures: Left heart catheterization with selective coronary angiography, left ventriculogram. Bypass graft angiography  Hospital Course: Terry Hansen is a 58 y.o. male with a history of CAD. He had bypass surgery with LIMA to LAD-D1 and 2013. He had chest pain and came to the emergency room where he was admitted for further evaluation and treatment.  His cardiac enzymes were negative for MI. He was treated with medications including aspirin and a GI cocktail with improvement in his symptoms. It was unclear whether his symptoms were cardiac or possibly GI in origin so cardiac catheterization was scheduled.  He had a cardiac catheterization on 11-13. Results are below. Previously placed bypass grafts are patent and there was no other critical disease. Medical therapy is recommended for possible small vessel disease.  There was concern for possible GI etiology of his pain and he was started on oral Protonix.  On 11/13, he was seen by Dr. Beau Fanny and all data were reviewed. No further inpatient workup is indicated and he is considered stable for discharge, to follow up as an outpatient.   Labs:   Lab Results  Component Value Date   WBC 9.4 06/02/2014   HGB 17.4* 06/02/2014   HCT 49.8 06/02/2014   MCV 86.9 06/02/2014   PLT 252 06/02/2014    Recent Labs Lab 06/01/14 1200  06/03/14 0538  NA 141  < > 140  K 4.3  < > 4.1  CL 101  < > 102  CO2 30  < > 26  BUN 9  < > 8  CREATININE 0.94  < > 1.03  CALCIUM 9.3  < > 8.9  PROT 6.4  --   --     BILITOT 0.4  --   --   ALKPHOS 118*  --   --   ALT 29  --   --   AST 20  --   --   GLUCOSE 74  < > 90  < > = values in this interval not displayed.  Recent Labs  06/01/14 1200 06/01/14 1519  TROPONINI <0.30 <0.30    Recent Labs  06/02/14 2335  INR 1.17      Radiology: Dg Chest 2 View 06/01/2014   CLINICAL DATA:  Chest pain and dizziness  EXAM: CHEST  2 VIEW  COMPARISON:  01/26/2014  FINDINGS: Previous median sternotomy. The heart size and mediastinal contours are within normal limits. Both lungs are clear. The visualized skeletal structures are unremarkable.  IMPRESSION: No active cardiopulmonary disease.   Electronically Signed   By: Kerby Moors M.D.   On: 06/01/2014 13:17   Cardiac Cath: 06/03/2014 ANGIOGRAPHIC DATA: The left main coronary artery is absent. There are separate ostia for the LAD and circumflex. The left anterior descending artery is patent proximally. After a small diagonal branch, there is an 80% stenosis. There is mild disease in the mid LAD. The insertion of the LIMA is well seen. There is competitive flow in the distal LAD. The left circumflex artery is a large vessel. The first obtuse marginal is large and widely patent. In the mid  circumflex, there is a 30-40% lesion at the origin of an atrial branch, which was present on prior angiogram. The OM 3 and OM for are medium size and widely patent. The right coronary artery is a small, nondominant vessel. There is mild disease in this vessel. The LIMA to LAD and diagonal is widely patent. LEFT VENTRICULOGRAM: Left ventricular angiogram was done in the 30 RAO projection and revealed normal left ventricular wall motion and systolic function with an estimated ejection fraction of 55%. LVEDP was 6 mmHg. IMPRESSIONS: 1. Absent left main coronary artery. 2. 80% mid left anterior descending artery stenosis. Patent LIMA to LAD/Diagonal.  3. Mild disease left circumflex artery and its branches. 4. Patent  nondominant right coronary artery. 5. Normal left ventricular systolic function. LVEDP 6 mmHg. Ejection fraction 55%. RECOMMENDATION: Continue medical therapy. Cardiology follow-up with Dr. Johnsie Cancel. Per the progress note from today, consider GI w/u.  EKG: 06/03/2014 Sinus bradycardia Vent. rate 52 BPM PR interval 118 ms QRS duration 102 ms QT/QTc 438/407 ms P-R-T axes 44 42 57  FOLLOW UP PLANS AND APPOINTMENTS No Known Allergies   Medication List    TAKE these medications        aspirin EC 81 MG tablet  Take 1 tablet (81 mg total) by mouth daily.     atorvastatin 40 MG tablet  Commonly known as:  LIPITOR  Take 40 mg by mouth daily.     BYSTOLIC 10 MG tablet  Generic drug:  nebivolol  TAKE 1 TABLET BY MOUTH DAILY     Coenzyme Q10 200 MG capsule  Take 200 mg by mouth every morning. disontinue while in hospital     ibuprofen 200 MG tablet  Commonly known as:  ADVIL,MOTRIN  Take 600 mg by mouth every 6 (six) hours as needed. migraine headache     lisinopril 10 MG tablet  Commonly known as:  PRINIVIL,ZESTRIL  Take 10 mg by mouth daily.     nitroGLYCERIN 0.4 MG SL tablet  Commonly known as:  NITROSTAT  Place 1 tablet (0.4 mg total) under the tongue every 5 (five) minutes as needed for chest pain.     pantoprazole 40 MG tablet  Commonly known as:  PROTONIX  Take 1 tablet (40 mg total) by mouth 2 (two) times daily before a meal.        Discharge Instructions    Diet - low sodium heart healthy    Complete by:  As directed      Increase activity slowly    Complete by:  As directed           Follow-up Information    Follow up with Jenkins Rouge, MD.   Specialty:  Cardiology   Why:  The office will call with appointment date and time.   Contact information:   [address] [address] [phone]       BRING ALL MEDICATIONS WITH YOU TO FOLLOW UP APPOINTMENTS  Time spent with patient to include physician time: 34  min Signed: Rosaria Ferries, PA-C 06/03/2014, 3:51 PM Co-Sign MD  I have examined the patient and reviewed assessment and plan and discussed with patient.  Agree with above as stated.  Bypass graft to D1 and LAD was patent.  No change in circumflex.  Plan for medical therapy for coronary artery disease.  Looking into GI causes.  Starting Protonix.  F/u with Dr. Johnsie Cancel.   Mikhael Hendriks S.

## 2014-06-03 NOTE — Plan of Care (Signed)
Problem: Consults Goal: Chest Pain Patient Education (See Patient Education module for education specifics.)  Outcome: Progressing  Problem: Phase I Progression Outcomes Goal: Anginal pain relieved Outcome: Completed/Met Date Met:  06/03/14     

## 2014-06-03 NOTE — CV Procedure (Signed)
    PROCEDURE:  Left heart catheterization with selective coronary angiography, left ventriculogram.  Bypass graft angiography  INDICATIONS:  Unstable angina, CAD  The risks, benefits, and details of the procedure were explained to the patient.  The patient verbalized understanding and wanted to proceed.  Informed written consent was obtained.  PROCEDURE TECHNIQUE:  After Xylocaine anesthesia a 39F sheath was placed in the right femoral artery with a single anterior needle wall stick.   Left coronary angiography was done using a Judkins L4 guide catheter.  Right coronary angiography was done using a Judkins R4 guide catheter.  LIMA was engaged with an IMA catheter. Left ventriculography was done using a pigtail catheter. Manual compression will be used for hemostasis.     CONTRAST:  Total of 80 cc.  COMPLICATIONS:  None.    HEMODYNAMICS:  Aortic pressure was 115/63; LV pressure was 118/1; LVEDP 6.  There was no gradient between the left ventricle and aorta.    ANGIOGRAPHIC DATA:   The left main coronary artery is absent. There are separate ostia for the LAD and circumflex.  The left anterior descending artery is patent proximally. After a small diagonal branch, there is an 80% stenosis. There is mild disease in the mid LAD. The insertion of the LIMA is well seen. There is competitive flow in the distal LAD.  The left circumflex artery is a large vessel. The first obtuse marginal is large and widely patent. In the mid circumflex, there is a 30-40% lesion at the origin of an atrial branch, which was present on prior angiogram.  The OM 3 and OM for are medium size and widely patent.  The right coronary artery is a small, nondominant vessel. There is mild disease in this vessel.  The LIMA to LAD and diagonal is widely patent.  LEFT VENTRICULOGRAM:  Left ventricular angiogram was done in the 30 RAO projection and revealed normal left ventricular wall motion and systolic function with an estimated  ejection fraction of 55%.  LVEDP was 6 mmHg.  IMPRESSIONS:  1. Absent left main coronary artery. 2. 80% mid left anterior descending artery stenosis.  Patent LIMA to LAD/Diagonal.  3. Mild disease left circumflex artery and its branches. 4. Patent nondominant right coronary artery. 5. Normal left ventricular systolic function.  LVEDP 6 mmHg.  Ejection fraction 55%.  RECOMMENDATION:  Continue medical therapy.  Cardiology follow-up with Dr. Johnsie Cancel.  Per the progress note from today, consider GI w/u.

## 2014-06-03 NOTE — Progress Notes (Signed)
Patient ID: Marice Angelino, male   DOB: 13-Jan-1956, 58 y.o.   MRN: [id number]    Subjective:  Denies SSCP, palpitations or Dyspnea   Objective:  Filed Vitals:   06/02/14 1615 06/02/14 1645 06/02/14 2013 06/03/14 0551  BP: 128/91 131/74 110/70 111/75  Pulse: 63 54 54 62  Temp:   98 F (36.7 C) 97.9 F (36.6 C)  TempSrc:   Oral Oral  Resp: 14 18 18 18   Height:   5\' 11"  (1.803 m)   Weight:   81.647 kg (180 lb) 81.693 kg (180 lb 1.6 oz)  SpO2: 97% 99% 97% 97%    Intake/Output from previous day:  Intake/Output Summary (Last 24 hours) at 06/03/14 0913 Last data filed at 06/03/14 7124  Gross per 24 hour  Intake    243 ml  Output    700 ml  Net   -457 ml    Physical Exam: Affect appropriate Healthy:  appears stated age HEENT: normal Neck supple with no adenopathy JVP normal no bruits no thyromegaly Lungs clear with no wheezing and good diaphragmatic motion Heart:  S1/S2 no murmur, no rub, gallop or click PMI normal Abdomen: benighn, BS positve, no tenderness, no AAA no bruit.  No HSM or HJR Distal pulses intact with no bruits No edema Neuro non-focal Skin warm and dry No muscular weakness   Lab Results: Basic Metabolic Panel:  Recent Labs  06/02/14 1059 06/03/14 0538  NA 140 140  K 4.0 4.1  CL 100 102  CO2 25 26  GLUCOSE 95 90  BUN 10 8  CREATININE 0.97 1.03  CALCIUM 9.6 8.9   Liver Function Tests:  Recent Labs  06/01/14 1200  AST 20  ALT 29  ALKPHOS 118*  BILITOT 0.4  PROT 6.4  ALBUMIN 3.6   CBC:  Recent Labs  06/01/14 1200 06/02/14 1059  WBC 7.0 9.4  NEUTROABS 5.2  --   HGB 15.5 17.4*  HCT 44.8 49.8  MCV 87.0 86.9  PLT 229 252   Cardiac Enzymes:  Recent Labs  06/01/14 1200 06/01/14 1519  TROPONINI <0.30 <0.30    Imaging: Dg Chest 2 View  06/01/2014   CLINICAL DATA:  Chest pain and dizziness  EXAM: CHEST  2 VIEW  COMPARISON:  01/26/2014  FINDINGS: Previous median sternotomy. The heart size and mediastinal contours are  within normal limits. Both lungs are clear. The visualized skeletal structures are unremarkable.  IMPRESSION: No active cardiopulmonary disease.   Electronically Signed   By: Kerby Moors M.D.   On: 06/01/2014 13:17    Cardiac Studies:  ECG:  NSR no acute ST/ Twave changes   Telemetry:  NSR no arrhythmia  Echo:   Medications:   . aspirin EC  81 mg Oral Daily  . atorvastatin  40 mg Oral Daily  . lisinopril  10 mg Oral Daily  . nebivolol  5 mg Oral Daily  . pantoprazole  40 mg Oral BID  . sodium chloride  3 mL Intravenous Q12H       Assessment/Plan:  HTN:  Well controlled continue ACE and beta blocker Chol: continue statin target LDL less than 70 GERD:  Some relief of pain with GI cocktail in ER d/c with proton pump inhibitor  If cath non obstructive consider outpatient EGD CAD:  Chest pain recurrent with 2 ER visits in 2 days.  RCA and circ not bypassed  LIMA jump graft LAD/D1  Cath today  Jenkins Rouge 06/03/2014, 9:13 AM

## 2014-06-03 NOTE — Progress Notes (Signed)
Utilization review completed. Jinny Sweetland, RN, BSN. 

## 2014-06-27 DIAGNOSIS — I200 Unstable angina: Secondary | ICD-10-CM | POA: Diagnosis present

## 2014-06-29 ENCOUNTER — Encounter: Payer: Self-pay | Admitting: Physician Assistant

## 2014-06-29 ENCOUNTER — Ambulatory Visit (INDEPENDENT_AMBULATORY_CARE_PROVIDER_SITE_OTHER): Payer: No Typology Code available for payment source | Admitting: Physician Assistant

## 2014-06-29 VITALS — BP 124/66 | HR 69 | Ht 71.0 in | Wt 190.0 lb

## 2014-06-29 DIAGNOSIS — R0789 Other chest pain: Secondary | ICD-10-CM

## 2014-06-29 DIAGNOSIS — R072 Precordial pain: Secondary | ICD-10-CM

## 2014-06-29 DIAGNOSIS — I10 Essential (primary) hypertension: Secondary | ICD-10-CM

## 2014-06-29 MED ORDER — LISINOPRIL 20 MG PO TABS: 20.0000 mg | ORAL_TABLET | Freq: Every day | ORAL | Status: DC

## 2014-06-29 NOTE — Assessment & Plan Note (Signed)
Recent cardiac catheterization showed patent grafts and no progression of disease. Patient has had no further chest pain.

## 2014-06-29 NOTE — Progress Notes (Signed)
HPI: This is a 58 year old male patient of Dr.Nishan who has history of coronary artery disease status post CABG in 2013 with a LIMA to the LAD and diagonal. He presented to the emergency room with chest pain or cardiac enzymes are negative. He underwent cardiac catheterization on 06/03/14. Previously placed bypass grafts were patent and there was no other critical disease. Medical therapy was recommended for possible small vessel disease. There was also concern for possible GI etiology of his pain and was started on Protonix. Patient also has a history of hypertension and hyperlipidemia.  Patient comes into for post hospital follow-up after recent cardiac catheterization. He says he feels better than he's had since his bypass surgery. He denies any further chest pain, palpitations, dyspnea, dyspnea on exertion, dizziness or presyncope. The Protonix is really helped his GERD. He still gets occasional indigestion but it's short-lived. His main complaint is decreased libido on Bystolic. He had the same problem on metoprolol and that did not control his blood pressure as well.  No Known Allergies   Current Outpatient Prescriptions  Medication Sig Dispense Refill  . aspirin EC 81 MG tablet Take 1 tablet (81 mg total) by mouth daily.    Marland Kitchen atorvastatin (LIPITOR) 40 MG tablet Take 40 mg by mouth daily.    Marland Kitchen BYSTOLIC 10 MG tablet TAKE 1 TABLET BY MOUTH DAILY 30 tablet 5  . Coenzyme Q10 200 MG capsule Take 200 mg by mouth every morning. disontinue while in hospital    . ibuprofen (ADVIL,MOTRIN) 200 MG tablet Take 600 mg by mouth every 6 (six) hours as needed. migraine headache    . lisinopril (PRINIVIL,ZESTRIL) 10 MG tablet Take 10 mg by mouth daily.    . nitroGLYCERIN (NITROSTAT) 0.4 MG SL tablet Place 1 tablet (0.4 mg total) under the tongue every 5 (five) minutes as needed for chest pain. 25 tablet 3  . pantoprazole (PROTONIX) 40 MG tablet Take 1 tablet (40 mg total) by mouth 2 (two) times  daily before a meal. 60 tablet 3   No current facility-administered medications for this visit.    Past Medical History  Diagnosis Date  . Hypertension   . CAD (coronary artery disease)     a.  LHC 06/26/12:  pLAD 90%, prox/mid/dist CFS 20-30%, oRCA 100% (non dom), EF 55%;  b. Echo 12/13:  EF 55-60%, Gr 1 diast dysfn;  c. s/p CABG [facility]) 12/13: L-LAD/Dx    . HLD (hyperlipidemia)   . GERD (gastroesophageal reflux disease)   . Ocular migraine     "maybe once q couple months" (06/02/2014)  . Unstable angina pectoris     Past Surgical History  Procedure Laterality Date  . Nasal septum surgery      For uncontrolled epistaxis early 2013  . Coronary artery bypass graft  06/29/2012    Procedure: CORONARY ARTERY BYPASS GRAFTING (CABG);  Surgeon: Melrose Nakayama, MD;  Location: [facility];  Service: Open Heart Surgery;  Laterality: N/A;  times two using Left Internal Mammary Artery  . Appendectomy  1970's  . Inguinal hernia repair Bilateral [phone]  . Cardiac catheterization  06/2012    Family History  Problem Relation Age of Onset  . Heart disease Brother     CABG age 29, redo bypass ~50  . Heart disease Paternal Grandmother   . Heart disease Cousin     Maternal side  . Heart attack Brother   . Colon cancer Maternal Grandfather     History  Social History  . Marital Status: Divorced    Spouse Name: N/A    Number of Children: N/A  . Years of Education: N/A   Occupational History  . Not on file.   Social History Main Topics  . Smoking status: Never Smoker   . Smokeless tobacco: Never Used  . Alcohol Use: 0.6 oz/week    1 Glasses of wine per week  . Drug Use: No  . Sexual Activity: Not Currently   Other Topics Concern  . Not on file   Social History Narrative   Divorced 7 years ago   3 children   Works in Aeronautical engineer    ROS:  Ht 5\' 11"  (1.803 m)  Wt 190 lb (86.183 kg)  BMI 26.51 kg/m2  PHYSICAL EXAM: Well-nournished, in no acute  distress. Neck: No JVD, HJR, Bruit, or thyroid enlargement  Lungs: No tachypnea, clear without wheezing, rales, or rhonchi  Cardiovascular: RRR, PMI not displaced, Normal S1 and S2, no murmurs, gallops, bruit, thrill, or heave.  Abdomen: BS normal. Soft without organomegaly, masses, lesions or tenderness.  Extremities: without cyanosis, clubbing or edema. Good distal pulses bilateral  SKin: Warm, no lesions or rashes   Musculoskeletal: No deformities  Neuro: no focal signs   Wt Readings from Last 3 Encounters:  06/29/14 190 lb (86.183 kg)  06/03/14 180 lb 1.6 oz (81.693 kg)  06/01/14 180 lb (81.647 kg)    Lab Results  Component Value Date   WBC 9.4 06/02/2014   HGB 17.4* 06/02/2014   HCT 49.8 06/02/2014   PLT 252 06/02/2014   GLUCOSE 90 06/03/2014   CHOL 104 11/30/2013   TRIG 55.0 11/30/2013   HDL 34.80* 11/30/2013   LDLCALC 58 11/30/2013   ALT 29 06/01/2014   AST 20 06/01/2014   NA 140 06/03/2014   K 4.1 06/03/2014   CL 102 06/03/2014   CREATININE 1.03 06/03/2014   BUN 8 06/03/2014   CO2 26 06/03/2014   TSH 0.91 01/31/2014   PSA 0.78 01/31/2014   INR 1.17 06/02/2014   HGBA1C 5.6 06/25/2012    EKG: Normal sinus rhythm, no acute change  Cardiac Cath: 06/03/2014 ANGIOGRAPHIC DATA:   The left main coronary artery is absent. There are separate ostia for the LAD and circumflex. The left anterior descending artery is patent proximally. After a small diagonal branch, there is an 80% stenosis. There is mild disease in the mid LAD. The insertion of the LIMA is well seen. There is competitive flow in the distal LAD. The left circumflex artery is a large vessel. The first obtuse marginal is large and widely patent. In the mid circumflex, there is a 30-40% lesion at the origin of an atrial branch, which was present on prior angiogram.  The OM 3 and OM for are medium size and widely patent. The right coronary artery is a small, nondominant vessel. There is mild disease in  this vessel. The LIMA to LAD and diagonal is widely patent. LEFT VENTRICULOGRAM:  Left ventricular angiogram was done in the 30 RAO projection and revealed normal left ventricular wall motion and systolic function with an estimated ejection fraction of 55%.  LVEDP was 6 mmHg. IMPRESSIONS:   1. Absent left main coronary artery. 2. 80% mid left anterior descending artery stenosis.  Patent LIMA to LAD/Diagonal.   3. Mild disease left circumflex artery and its branches. 4. Patent nondominant right coronary artery. 5. Normal left ventricular systolic function.  LVEDP 6 mmHg.  Ejection fraction 55%. RECOMMENDATION:  Continue medical therapy.  Cardiology follow-up with Dr. Johnsie Cancel.  Per the progress note from today, consider GI w/u.

## 2014-06-29 NOTE — Patient Instructions (Signed)
Your physician has recommended you make the following change in your medication:    START TAKING LISINOPRIL 20MG  ONCE A DAY   CHECK BLOOD PRESSURE AT HOME   Your physician recommends that you schedule a follow-up appointment in:  WITH DR Johnsie Cancel IN 4 MONTSH

## 2014-06-29 NOTE — Assessment & Plan Note (Signed)
I discussed this patient with Dr. Johnsie Cancel who agrees to stop the patient's bystolic because of libido issues. We will increase his lisinopril to 20 mg once daily. He will monitor his blood pressures at home and call for problems. Follow-up with Dr. Johnsie Cancel in 4 months.

## 2014-06-30 ENCOUNTER — Encounter (HOSPITAL_COMMUNITY): Payer: Self-pay | Admitting: Cardiovascular Disease

## 2014-07-10 ENCOUNTER — Telehealth: Payer: Self-pay | Admitting: Internal Medicine

## 2014-07-10 NOTE — Telephone Encounter (Signed)
Patient called regarding BP being persistently elevated over the weekend.  He dropped Bystolic due to side effects, and increased lisinopril to 20mg  as monotherapy 3 days ago.  BP 160s/90s and up, HR 100s.  I recommended restarting Bystolic and calling back on a weekday to discuss switching from bystolic to an alternative second agent.  Patient agreed with the plan.  Stephani Police, MD

## 2014-07-11 ENCOUNTER — Telehealth: Payer: Self-pay | Admitting: Cardiovascular Disease

## 2014-07-11 NOTE — Telephone Encounter (Signed)
New Msg     Pt changed began taking one of his BP medications again per recommendations of dr on call yesterday.   Pt would like to follow up to see what he should do at this point, how many BP meds does he need to take and how often.    Please contact pt [phone].

## 2014-07-11 NOTE — Telephone Encounter (Signed)
PT  DID NOT TOLERATE  BYSTOLIC  AND  STOPPED MED  AND  IN CREASED LISINOPRIL ON  OWN PER ON CALL  MD  PT  NEEDED TO CALL  AND  GET  DIFFER  MED   SEE    DOCUMENTATION   BELOW  FROM WEEKEND.  Expand All Collapse All   Patient called regarding BP being persistently elevated over the weekend. He dropped Bystolic due to side effects, and increased lisinopril to 20mg  as monotherapy 3 days ago. BP 160s/90s and up, HR 100s. I recommended restarting Bystolic and calling back on a weekday to discuss switching from bystolic to an alternative second agent. Patient agreed with the plan.  Stephani Police, MD

## 2014-07-11 NOTE — Telephone Encounter (Signed)
Increase lisinopril to 40 mg and try atenolol 25 mg Needs to be on beta blocker if possible with tachycardia and history of CABG/CAD

## 2014-07-12 NOTE — Telephone Encounter (Signed)
PER PT  PT HAS  RESTARTED   BYSTOLIC   AND  SEEMS  TO BE TOLERATING MED  AS   WELL IS  TAKING  LISINOPRIL   20 MG    PT  WOULD  LIKE  T O  MONITOR   B/P AND  HEART  RATE FOR    7- 10 - DAYS   AND  CALL WITH  RESULTS IS  AFRAID  WILL BE OVER MEDICATED  WITH CHANGES  WOULD  RATHER  STAY  ON  MEDS ON NOW   WILL FORWARD TO  DR Johnsie Cancel  FOR REVIEW .Adonis Housekeeper

## 2014-07-13 NOTE — Telephone Encounter (Signed)
That's fine

## 2014-07-19 NOTE — Telephone Encounter (Signed)
LEFT VOICE MAIL  THAT  DR Johnsie Cancel  IS  OKAY WITH CONTINUING ON MEDS  AS  DISCUSSED  WILL AWAIT  PHONE  CALL FROM PT  WITH  B/P AND  HEART  READINGS .Adonis Housekeeper

## 2014-07-23 ENCOUNTER — Other Ambulatory Visit: Payer: Self-pay | Admitting: Cardiovascular Disease

## 2014-07-25 ENCOUNTER — Telehealth: Payer: Self-pay | Admitting: Cardiovascular Disease

## 2014-07-25 ENCOUNTER — Other Ambulatory Visit: Payer: Self-pay | Admitting: Cardiovascular Disease

## 2014-07-25 NOTE — Telephone Encounter (Signed)
Continue current meds 

## 2014-07-25 NOTE — Telephone Encounter (Signed)
New message      Pt c/o BP issue:  1. What are your last 5 BP readings? 07-20-14 142/85 pulse 72; 07-21-14 148/89 pulse 60; 07-23-14 143/86 pulse 59 2. Are you having any other symptoms (ex. Dizziness, headache, blurred vision, passed out)? Pt is a little lightheaded but have been for a few days 3. What is your medication issue?  Still having medication adjusted

## 2014-07-25 NOTE — Telephone Encounter (Signed)
Advised patient to continue current medications. Informed him to call office next month if SBP stays in 140s, to see if Dr. Johnsie Cancel recommends any changes of sustained numbers. Patient verbalized understanding and agreeable to plan.

## 2014-07-25 NOTE — Telephone Encounter (Signed)
Informed patient Dr. Johnsie Cancel out of the office today but will forward information to him for recommendations. Patient is currently taking Bystolic and Lisinopril. See 12/21 telephone note for further info.

## 2014-07-26 ENCOUNTER — Telehealth: Payer: Self-pay

## 2014-07-28 NOTE — Telephone Encounter (Signed)
refill 

## 2014-09-26 ENCOUNTER — Ambulatory Visit (INDEPENDENT_AMBULATORY_CARE_PROVIDER_SITE_OTHER): Payer: BLUE CROSS/BLUE SHIELD | Admitting: Internal Medicine

## 2014-09-26 ENCOUNTER — Encounter: Payer: Self-pay | Admitting: Internal Medicine

## 2014-09-26 VITALS — BP 150/90 | HR 73 | Resp 20 | Ht 71.0 in | Wt 190.0 lb

## 2014-09-26 DIAGNOSIS — I10 Essential (primary) hypertension: Secondary | ICD-10-CM

## 2014-09-26 DIAGNOSIS — G4700 Insomnia, unspecified: Secondary | ICD-10-CM

## 2014-09-26 DIAGNOSIS — I2510 Atherosclerotic heart disease of native coronary artery without angina pectoris: Secondary | ICD-10-CM

## 2014-09-26 MED ORDER — ESCITALOPRAM OXALATE 10 MG PO TABS: 10.0000 mg | ORAL_TABLET | Freq: Every day | ORAL | Status: DC

## 2014-09-26 MED ORDER — ZOLPIDEM TARTRATE 5 MG PO TABS: 5.0000 mg | ORAL_TABLET | Freq: Every evening | ORAL | Status: DC | PRN

## 2014-09-26 NOTE — Patient Instructions (Signed)
Insomnia Insomnia is frequent trouble falling and/or staying asleep. Insomnia can be a long term problem or a short term problem. Both are common. Insomnia can be a short term problem when the wakefulness is related to a certain stress or worry. Long term insomnia is often related to ongoing stress during waking hours and/or poor sleeping habits. Overtime, sleep deprivation itself can make the problem worse. Every little thing feels more severe because you are overtired and your ability to cope is decreased. CAUSES   Stress, anxiety, and depression.  Poor sleeping habits.  Distractions such as TV in the bedroom.  Naps close to bedtime.  Engaging in emotionally charged conversations before bed.  Technical reading before sleep.  Alcohol and other sedatives. They may make the problem worse. They can hurt normal sleep patterns and normal dream activity.  Stimulants such as caffeine for several hours prior to bedtime.  Pain syndromes and shortness of breath can cause insomnia.  Exercise late at night.  Changing time zones may cause sleeping problems (jet lag). It is sometimes helpful to have someone observe your sleeping patterns. They should look for periods of not breathing during the night (sleep apnea). They should also look to see how long those periods last. If you live alone or observers are uncertain, you can also be observed at a sleep clinic where your sleep patterns will be professionally monitored. Sleep apnea requires a checkup and treatment. Give your caregivers your medical history. Give your caregivers observations your family has made about your sleep.  SYMPTOMS   Not feeling rested in the morning.  Anxiety and restlessness at bedtime.  Difficulty falling and staying asleep. TREATMENT   Your caregiver may prescribe treatment for an underlying medical disorders. Your caregiver can give advice or help if you are using alcohol or other drugs for self-medication. Treatment  of underlying problems will usually eliminate insomnia problems.  Medications can be prescribed for short time use. They are generally not recommended for lengthy use.  Over-the-counter sleep medicines are not recommended for lengthy use. They can be habit forming.  You can promote easier sleeping by making lifestyle changes such as:  Using relaxation techniques that help with breathing and reduce muscle tension.  Exercising earlier in the day.  Changing your diet and the time of your last meal. No night time snacks.  Establish a regular time to go to bed.  Counseling can help with stressful problems and worry.  Soothing music and white noise may be helpful if there are background noises you cannot remove.  Stop tedious detailed work at least one hour before bedtime. HOME CARE INSTRUCTIONS   Keep a diary. Inform your caregiver about your progress. This includes any medication side effects. See your caregiver regularly. Take note of:  Times when you are asleep.  Times when you are awake during the night.  The quality of your sleep.  How you feel the next day. This information will help your caregiver care for you.  Get out of bed if you are still awake after 15 minutes. Read or do some quiet activity. Keep the lights down. Wait until you feel sleepy and go back to bed.  Keep regular sleeping and waking hours. Avoid naps.  Exercise regularly.  Avoid distractions at bedtime. Distractions include watching television or engaging in any intense or detailed activity like attempting to balance the household checkbook.  Develop a bedtime ritual. Keep a familiar routine of bathing, brushing your teeth, climbing into bed at the same   time each night, listening to soothing music. Routines increase the success of falling to sleep faster.  Use relaxation techniques. This can be using breathing and muscle tension release routines. It can also include visualizing peaceful scenes. You can  also help control troubling or intruding thoughts by keeping your mind occupied with boring or repetitive thoughts like the old concept of counting sheep. You can make it more creative like imagining planting one beautiful flower after another in your backyard garden.  During your day, work to eliminate stress. When this is not possible use some of the previous suggestions to help reduce the anxiety that accompanies stressful situations. MAKE SURE YOU:   Understand these instructions.  Will watch your condition.  Will get help right away if you are not doing well or get worse. Document Released: 07/05/2000 Document Revised: 09/30/2011 Document Reviewed: 08/05/2007 [employer] Patient Information 2015 [employer]. This information is not intended to replace advice given to you by your health care provider. Make sure you discuss any questions you have with your health care provider.  

## 2014-09-26 NOTE — Progress Notes (Signed)
Subjective:    Patient ID: Terry Hansen, male    DOB: 07-24-1955, 59 y.o.   MRN: [id number]  HPI  59 year old patient who has a history of coronary artery disease and essential hypertension.  He presents with a one-month history of insomnia.  He does describe some situational stress at work.  He also states that he feels like he may be depressed.  He complains of poor sleep and decreased energy throughout the day. He also describes some discomfort involving the hands and arms, especially the right hand.  These symptoms seem to be improving  Past Medical History  Diagnosis Date  . Hypertension   . CAD (coronary artery disease)     a.  LHC 06/26/12:  pLAD 90%, prox/mid/dist CFS 20-30%, oRCA 100% (non dom), EF 55%;  b. Echo 12/13:  EF 55-60%, Gr 1 diast dysfn;  c. s/p CABG Roxan Hockey) 12/13: L-LAD/Dx    . HLD (hyperlipidemia)   . GERD (gastroesophageal reflux disease)   . Ocular migraine     "maybe once q couple months" (06/02/2014)  . Unstable angina pectoris     History   Social History  . Marital Status: Divorced    Spouse Name: N/A  . Number of Children: N/A  . Years of Education: N/A   Occupational History  . Not on file.   Social History Main Topics  . Smoking status: Never Smoker   . Smokeless tobacco: Never Used  . Alcohol Use: 0.6 oz/week    1 Glasses of wine per week  . Drug Use: No  . Sexual Activity: Not Currently   Other Topics Concern  . Not on file   Social History Narrative   Divorced 7 years ago   3 children   Works in Aeronautical engineer    Past Surgical History  Procedure Laterality Date  . Nasal septum surgery      For uncontrolled epistaxis early 2013  . Coronary artery bypass graft  06/29/2012    Procedure: CORONARY ARTERY BYPASS GRAFTING (CABG);  Surgeon: Melrose Nakayama, MD;  Location: [place];  Service: Open Heart Surgery;  Laterality: N/A;  times two using Left Internal Mammary Artery  . Appendectomy  1970's  . Inguinal hernia  repair Bilateral [phone]  . Cardiac catheterization  06/2012  . Left heart catheterization with coronary angiogram N/A 06/26/2012    Procedure: LEFT HEART CATHETERIZATION WITH CORONARY ANGIOGRAM;  Surgeon: Josue Hector, MD;  Location: [facility] CATH LAB;  Service: Cardiovascular;  Laterality: N/A;  . Left heart catheterization with coronary/graft angiogram N/A 06/03/2014    Procedure: LEFT HEART CATHETERIZATION WITH Beatrix Fetters;  Surgeon: Jettie Booze, MD;  Location: [facility] CATH LAB;  Service: Cardiovascular;  Laterality: N/A;    Family History  Problem Relation Age of Onset  . Heart disease Brother     CABG age 76, redo bypass ~50  . Heart disease Paternal Grandmother   . Heart disease Cousin     Maternal side  . Heart attack Brother   . Colon cancer Maternal Grandfather     No Known Allergies  Current Outpatient Prescriptions on File Prior to Visit  Medication Sig Dispense Refill  . aspirin EC 81 MG tablet Take 1 tablet (81 mg total) by mouth daily.    Marland Kitchen atorvastatin (LIPITOR) 40 MG tablet TAKE 1 TABLET BY MOUTH EVERY DAY AT 6 PM 30 tablet 0  . Coenzyme Q10 200 MG capsule Take 200 mg by mouth every morning. disontinue while in hospital    .  ibuprofen (ADVIL,MOTRIN) 200 MG tablet Take 600 mg by mouth every 6 (six) hours as needed. migraine headache    . lisinopril (PRINIVIL,ZESTRIL) 20 MG tablet Take 1 tablet (20 mg total) by mouth daily. 30 tablet 6  . nitroGLYCERIN (NITROSTAT) 0.4 MG SL tablet Place 1 tablet (0.4 mg total) under the tongue every 5 (five) minutes as needed for chest pain. 25 tablet 3  . pantoprazole (PROTONIX) 40 MG tablet Take 1 tablet (40 mg total) by mouth 2 (two) times daily before a meal. 60 tablet 3   No current facility-administered medications on file prior to visit.    BP 150/90 mmHg  Pulse 73  Resp 20  Ht 5\' 11"  (1.803 m)  Wt 190 lb (86.183 kg)  BMI 26.51 kg/m2  SpO2 98%     Review of Systems  Constitutional: Positive for fatigue.  Negative for fever, chills and appetite change.  HENT: Negative for congestion, dental problem, ear pain, hearing loss, sore throat, tinnitus, trouble swallowing and voice change.   Eyes: Negative for pain, discharge and visual disturbance.  Respiratory: Negative for cough, chest tightness, wheezing and stridor.   Cardiovascular: Negative for chest pain, palpitations and leg swelling.  Gastrointestinal: Negative for nausea, vomiting, abdominal pain, diarrhea, constipation, blood in stool and abdominal distention.  Genitourinary: Negative for urgency, hematuria, flank pain, discharge, difficulty urinating and genital sores.  Musculoskeletal: Positive for myalgias. Negative for back pain, joint swelling, arthralgias, gait problem and neck stiffness.  Skin: Negative for rash.  Neurological: Negative for dizziness, syncope, speech difficulty, weakness, numbness and headaches.  Hematological: Negative for adenopathy. Does not bruise/bleed easily.  Psychiatric/Behavioral: Positive for sleep disturbance and dysphoric mood. Negative for behavioral problems. The patient is not nervous/anxious.        Objective:   Physical Exam  Constitutional: He is oriented to person, place, and time. He appears well-developed.  HENT:  Head: Normocephalic.  Right Ear: External ear normal.  Left Ear: External ear normal.  Eyes: Conjunctivae and EOM are normal.  Neck: Normal range of motion.  Cardiovascular: Normal rate and normal heart sounds.   Pulmonary/Chest: Breath sounds normal.  Abdominal: Bowel sounds are normal.  Musculoskeletal: Normal range of motion. He exhibits no edema or tenderness.  Right arm appeared normal  Neurological: He is alert and oriented to person, place, and time.  Psychiatric: He has a normal mood and affect. His behavior is normal.          Assessment & Plan:   Insomnia.  Sleep hygiene issues discussed.  Patient information dispensed Situational stress/possible mild  depression.  Options discussed.  Will place on Lexapro 10 and when necessary Ambien.  Recheck one month with PCP

## 2014-09-26 NOTE — Progress Notes (Signed)
Pre visit review using our clinic review tool, if applicable. No additional management support is needed unless otherwise documented below in the visit note. 

## 2014-09-29 ENCOUNTER — Emergency Department (HOSPITAL_COMMUNITY): Payer: BLUE CROSS/BLUE SHIELD

## 2014-09-29 ENCOUNTER — Emergency Department (HOSPITAL_COMMUNITY)
Admission: EM | Admit: 2014-09-29 | Discharge: 2014-09-29 | Disposition: A | Discharge status: Home / Self Care | Payer: BLUE CROSS/BLUE SHIELD | Attending: Emergency Medicine | Admitting: Emergency Medicine

## 2014-09-29 ENCOUNTER — Encounter (HOSPITAL_COMMUNITY): Payer: Self-pay | Admitting: Emergency Medicine

## 2014-09-29 ENCOUNTER — Other Ambulatory Visit (HOSPITAL_COMMUNITY): Payer: Self-pay

## 2014-09-29 DIAGNOSIS — E785 Hyperlipidemia, unspecified: Secondary | ICD-10-CM | POA: Insufficient documentation

## 2014-09-29 DIAGNOSIS — Z79899 Other long term (current) drug therapy: Secondary | ICD-10-CM | POA: Insufficient documentation

## 2014-09-29 DIAGNOSIS — Z7982 Long term (current) use of aspirin: Secondary | ICD-10-CM | POA: Diagnosis not present

## 2014-09-29 DIAGNOSIS — I10 Essential (primary) hypertension: Secondary | ICD-10-CM | POA: Diagnosis not present

## 2014-09-29 DIAGNOSIS — R079 Chest pain, unspecified: Secondary | ICD-10-CM | POA: Insufficient documentation

## 2014-09-29 DIAGNOSIS — I2510 Atherosclerotic heart disease of native coronary artery without angina pectoris: Secondary | ICD-10-CM | POA: Diagnosis not present

## 2014-09-29 DIAGNOSIS — K219 Gastro-esophageal reflux disease without esophagitis: Secondary | ICD-10-CM | POA: Diagnosis not present

## 2014-09-29 DIAGNOSIS — Z951 Presence of aortocoronary bypass graft: Secondary | ICD-10-CM | POA: Diagnosis not present

## 2014-09-29 LAB — COMPREHENSIVE METABOLIC PANEL
ALBUMIN: 3.8 g/dL (ref 3.5–5.2)
ALT: 26 U/L (ref 0–53)
AST: 22 U/L (ref 0–37)
Alkaline Phosphatase: 102 U/L (ref 39–117)
Anion gap: 6 (ref 5–15)
BUN: 14 mg/dL (ref 6–23)
CALCIUM: 9 mg/dL (ref 8.4–10.5)
CO2: 29 mmol/L (ref 19–32)
Chloride: 101 mmol/L (ref 96–112)
Creatinine, Ser: 1.13 mg/dL (ref 0.50–1.35)
GFR, EST AFRICAN AMERICAN: 79 mL/min — AB (ref >90)
GFR, EST NON AFRICAN AMERICAN: 68 mL/min — AB (ref >90)
GLUCOSE: 85 mg/dL (ref 70–99)
Potassium: 4 mmol/L (ref 3.5–5.1)
Sodium: 136 mmol/L (ref 135–145)
Total Bilirubin: 0.8 mg/dL (ref 0.3–1.2)
Total Protein: 6.5 g/dL (ref 6.0–8.3)

## 2014-09-29 LAB — CBC
HCT: 43.6 % (ref 39.0–52.0)
HEMOGLOBIN: 15.3 g/dL (ref 13.0–17.0)
MCH: 29.4 pg (ref 26.0–34.0)
MCHC: 35.1 g/dL (ref 30.0–36.0)
MCV: 83.8 fL (ref 78.0–100.0)
Platelets: 217 10*3/uL (ref 150–400)
RBC: 5.2 MIL/uL (ref 4.22–5.81)
RDW: 12.2 % (ref 11.5–15.5)
WBC: 7.7 10*3/uL (ref 4.0–10.5)

## 2014-09-29 LAB — I-STAT TROPONIN, ED: Troponin i, poc: 0 ng/mL (ref 0.00–0.08)

## 2014-09-29 LAB — TROPONIN I
Troponin I: <0.03 ng/mL (ref <0.031)
Troponin I: <0.03 ng/mL (ref <0.031)

## 2014-09-29 MED ORDER — KETOROLAC TROMETHAMINE 30 MG/ML IJ SOLN: 30.0000 mg | Freq: Once | INTRAMUSCULAR | Status: DC

## 2014-09-29 MED ORDER — ONDANSETRON HCL 4 MG/2ML IJ SOLN: 4.0000 mg | Freq: Once | INTRAMUSCULAR | Status: DC

## 2014-09-29 MED ORDER — ONDANSETRON HCL 4 MG/2ML IJ SOLN
4.0000 mg | Freq: Once | INTRAMUSCULAR | Status: AC
  Administered 2014-09-29: 4 mg via INTRAVENOUS
  Filled 2014-09-29: qty 2

## 2014-09-29 MED ORDER — NITROGLYCERIN 0.4 MG SL SUBL: 0.4000 mg | SUBLINGUAL_TABLET | SUBLINGUAL | Status: DC | PRN

## 2014-09-29 MED ORDER — SODIUM CHLORIDE 0.9 % IV SOLN: Freq: Once | INTRAVENOUS | Status: DC

## 2014-09-29 MED ORDER — SODIUM CHLORIDE 0.9 % IV BOLUS (SEPSIS): 1000.0000 mL | Freq: Once | INTRAVENOUS | Status: DC

## 2014-09-29 NOTE — Discharge Instructions (Signed)
Contact Dr. physician's office for recheck appointment.  Continue your Ambien as needed for sleep, and your Lexapro as prescribed.  Return here with any worsening symptoms.

## 2014-09-29 NOTE — ED Provider Notes (Signed)
CSN: [id number]     Arrival date & time 09/29/14  83 History   First MD Initiated Contact with Patient 09/29/14 1604     Chief Complaint  Patient presents with  . Chest Pain      HPI  Patient presents for evaluation of an episode of chest tightness and nausea started approximately 3:00 this afternoon. He ate lunch about 1. Nothing unusual. Oxygen hours later while at work he developed tightness across his anterior chest more towards his right shoulder. Since that also seem to present between his shoulder blades. He felt weak all over and mild nausea. Symptoms persisted. Called 911. Was brought in by paramedics. Given aspirin 4, nitroglycerin 1. He states the tightness seems to be coming and going. States it is coming and going with less intensity but continues intermittently.  Patient states that for the last week or 2 he is had difficulty sleeping. Doesn't he has some general daily anxiety about his heart.  Seen by his primary care physician on Monday. Started on Ambien for sleep, and Lexapro for depression and anxiety.   Has a history of coronary bypass grafting in 2013. Had an episode of atypical pain in November and had repeat catheterization. This showed patent grafts to his LAD, proximally and distally, with a stable mid 80% lesion.Marland Kitchen He did not have graft to his right, or circumflex.  Past Medical History  Diagnosis Date  . Hypertension   . CAD (coronary artery disease)     a.  LHC 06/26/12:  pLAD 90%, prox/mid/dist CFS 20-30%, oRCA 100% (non dom), EF 55%;  b. Echo 12/13:  EF 55-60%, Gr 1 diast dysfn;  c. s/p CABG Roxan Hockey) 12/13: L-LAD/Dx    . HLD (hyperlipidemia)   . GERD (gastroesophageal reflux disease)   . Ocular migraine     "maybe once q couple months" (06/02/2014)  . Unstable angina pectoris    Past Surgical History  Procedure Laterality Date  . Nasal septum surgery      For uncontrolled epistaxis early 2013  . Coronary artery bypass graft  06/29/2012   Procedure: CORONARY ARTERY BYPASS GRAFTING (CABG);  Surgeon: Melrose Nakayama, MD;  Location: [place];  Service: Open Heart Surgery;  Laterality: N/A;  times two using Left Internal Mammary Artery  . Appendectomy  1970's  . Inguinal hernia repair Bilateral [phone]  . Cardiac catheterization  06/2012  . Left heart catheterization with coronary angiogram N/A 06/26/2012    Procedure: LEFT HEART CATHETERIZATION WITH CORONARY ANGIOGRAM;  Surgeon: Josue Hector, MD;  Location[facility] CATH LAB;  Service: Cardiovascular;  Laterality: N/A;  . Left heart catheterization with coronary/graft angiogram N/A 06/03/2014    Procedure: LEFT HEART CATHETERIZATION WITH Beatrix Fetters;  Surgeon: Jettie Booze, MD;  Location: [facility] CATH LAB;  Service: Cardiovascular;  Laterality: N/A;   Family History  Problem Relation Age of Onset  . Heart disease Brother     CABG age 90, redo bypass ~50  . Heart disease Paternal Grandmother   . Heart disease Cousin     Maternal side  . Heart attack Brother   . Colon cancer Maternal Grandfather    History  Substance Use Topics  . Smoking status: Never Smoker   . Smokeless tobacco: Never Used  . Alcohol Use: 0.6 oz/week    1 Glasses of wine per week    Review of Systems  Constitutional: Negative for fever, chills, diaphoresis, appetite change and fatigue.  HENT: Negative for mouth sores, sore throat and trouble  swallowing.   Eyes: Negative for visual disturbance.  Respiratory: Negative for cough, chest tightness, shortness of breath and wheezing.   Cardiovascular: Positive for chest pain.  Gastrointestinal: Positive for nausea. Negative for vomiting, abdominal pain, diarrhea and abdominal distention.  Endocrine: Negative for polydipsia, polyphagia and polyuria.  Genitourinary: Negative for dysuria, frequency and hematuria.  Musculoskeletal: Negative for gait problem.  Skin: Negative for color change, pallor and rash.  Neurological: Negative for  dizziness, syncope, light-headedness and headaches.  Hematological: Does not bruise/bleed easily.  Psychiatric/Behavioral: Negative for behavioral problems and confusion.      Allergies  Review of patient's allergies indicates no known allergies.  Home Medications   Prior to Admission medications   Medication Sig Start Date End Date Taking? Authorizing Provider  aspirin EC 81 MG tablet Take 1 tablet (81 mg total) by mouth daily. 07/21/12   Liliane Shi, PA-C  atorvastatin (LIPITOR) 40 MG tablet TAKE 1 TABLET BY MOUTH EVERY DAY AT 6 PM 07/26/14   Thayer Headings, MD  Coenzyme Q10 200 MG capsule Take 200 mg by mouth every morning. disontinue while in hospital    Historical Provider, MD  escitalopram (LEXAPRO) 10 MG tablet Take 1 tablet (10 mg total) by mouth daily. 09/26/14   Marletta Lor, MD  ibuprofen (ADVIL,MOTRIN) 200 MG tablet Take 600 mg by mouth every 6 (six) hours as needed. migraine headache    Historical Provider, MD  lisinopril (PRINIVIL,ZESTRIL) 20 MG tablet Take 1 tablet (20 mg total) by mouth daily. 06/29/14   Imogene Burn, PA-C  nebivolol (BYSTOLIC) 10 MG tablet Take 10 mg by mouth daily. TAKE 1 TABLET BY MOUTH DAILY 04/25/14   Historical Provider, MD  nitroGLYCERIN (NITROSTAT) 0.4 MG SL tablet Place 1 tablet (0.4 mg total) under the tongue every 5 (five) minutes as needed for chest pain. 11/29/13   Josue Hector, MD  pantoprazole (PROTONIX) 40 MG tablet Take 1 tablet (40 mg total) by mouth 2 (two) times daily before a meal. 06/03/14   Rhonda G Barrett, PA-C  zolpidem (AMBIEN) 5 MG tablet Take 1 tablet (5 mg total) by mouth at bedtime as needed for sleep. 09/26/14   Marletta Lor, MD   BP 135/75 mmHg  Pulse 58  Temp(Src) 97.8 F (36.6 C) (Oral)  Resp 15  Ht 5\' 11"  (1.803 m)  SpO2 95% Physical Exam  Constitutional: He is oriented to person, place, and time. He appears well-developed and well-nourished. No distress.  HENT:  Head: Normocephalic.  Eyes:  Conjunctivae are normal. Pupils are equal, round, and reactive to light. No scleral icterus.  Neck: Normal range of motion. Neck supple. No thyromegaly present.  Cardiovascular: Normal rate and regular rhythm.  Exam reveals no gallop and no friction rub.   No murmur heard. Pulmonary/Chest: Effort normal and breath sounds normal. No respiratory distress. He has no wheezes. He has no rales.  Abdominal: Soft. Bowel sounds are normal. He exhibits no distension. There is no tenderness. There is no rebound.  Musculoskeletal: Normal range of motion.  Neurological: He is alert and oriented to person, place, and time.  Skin: Skin is warm and dry. No rash noted.  Psychiatric: He has a normal mood and affect. His behavior is normal.    ED Course  Procedures (including critical care time) Labs Review Labs Reviewed  CBC  COMPREHENSIVE METABOLIC PANEL  TROPONIN I  I-STAT TROPOININ, ED    Imaging Review No results found.   EKG Interpretation None  MDM   Final diagnoses:  Chest pain    Patient with very reassuring half months ago. Symptoms today seemed to be more triggered by his preoccupation anxiety with his cardiac history as well as his brothers difficult pass. His repeat troponin is normal. He remains asymptomatic. He seems to get great relief of symptoms with reassurance of his initial normal studies. I discussed the case with on-call for the patient's relative's Dr. condition. Follow-up appointment will be arranged. Asked patient to call the office tomorrow for the exact time.    Tanna Furry, MD 09/29/14 2017

## 2014-09-29 NOTE — ED Notes (Signed)
Sitting at work when had sudden chest tightness, started in center, radiated across entire chest, went into right shoulder and back between shoulder blades; felt weak and nauseated. Took 325mg  ASA; resolved on EMS arrival. Given nitro x1 in route; currently denies any CP, but feels weak. History of CABG 2 years ago. Blockages found prior to any MI. No prior experience with CP.

## 2014-09-30 ENCOUNTER — Encounter: Payer: Self-pay | Admitting: Cardiovascular Disease

## 2014-09-30 ENCOUNTER — Ambulatory Visit (INDEPENDENT_AMBULATORY_CARE_PROVIDER_SITE_OTHER): Payer: BLUE CROSS/BLUE SHIELD | Admitting: Cardiovascular Disease

## 2014-09-30 VITALS — BP 140/82 | HR 64 | Ht 71.0 in | Wt 189.0 lb

## 2014-09-30 DIAGNOSIS — I2510 Atherosclerotic heart disease of native coronary artery without angina pectoris: Secondary | ICD-10-CM

## 2014-09-30 DIAGNOSIS — I2583 Coronary atherosclerosis due to lipid rich plaque: Principal | ICD-10-CM

## 2014-09-30 DIAGNOSIS — I10 Essential (primary) hypertension: Secondary | ICD-10-CM

## 2014-09-30 DIAGNOSIS — E782 Mixed hyperlipidemia: Secondary | ICD-10-CM

## 2014-09-30 NOTE — Progress Notes (Signed)
Patient ID: Terry Hansen, male   DOB: 12/26/1955, 59 y.o.   MRN: [id number]             HPI: This is a 59 y.o. male patient who has history of coronary artery disease status post CABG in 2013 with a LIMA to the LAD and diagonal. He presented to the emergency room with chest pain or cardiac enzymes are negative. He underwent cardiac catheterization on 06/03/14. Previously placed bypass grafts were patent and there was no other critical disease. Medical therapy was recommended for possible small vessel disease. There was also concern for possible GI etiology of his pain and was started on Protonix. Patient also has a history of hypertension and hyperlipidemia.  Seen in ER 3/10 for atypical pain r/o no ECG changes and thought pain non cardiac and symptoms related to anxiety He has been on ambien and lexapro for this per primary MD  12/15 beta blocker stopped due to libido and fatigue issues and lisinopril increased   However he restarted bystolic when BP elevated   Cardiac Cath: 06/03/2014 Reviewed on CV Image   ANGIOGRAPHIC DATA:   The left main coronary artery is absent. There are separate ostia for the LAD and circumflex. The left anterior descending artery is patent proximally. After a small diagonal branch, there is an 80% stenosis. There is mild disease in the mid LAD. The insertion of the LIMA is well seen. There is competitive flow in the distal LAD. The left circumflex artery is a large vessel. The first obtuse marginal is large and widely patent. In the mid circumflex, there is a 30-40% lesion at the origin of an atrial branch, which was present on prior angiogram.  The OM 3 and OM for are medium size and widely patent. The right coronary artery is a small, nondominant vessel. There is mild disease in this vessel. The LIMA to LAD and diagonal is widely patent. LEFT VENTRICULOGRAM:  Left ventricular angiogram was done in the 30 RAO projection and revealed normal left ventricular wall motion  and systolic function with an estimated ejection fraction of 55%.  LVEDP was 6 mmHg. IMPRESSIONS:   1. Absent left main coronary artery. 2. 80% mid left anterior descending artery stenosis.  Patent LIMA to LAD/Diagonal.   3. Mild disease left circumflex artery and its branches. 4. Patent nondominant right coronary artery. 5. Normal left ventricular systolic function.  LVEDP 6 mmHg.  Ejection fraction 55%. RECOMMENDATION:  Continue medical therapy.  Cardiology follow-up with Dr. Johnsie Cancel.  Per the progress note from today, consider GI w/u.   No Known Allergies   Current Outpatient Prescriptions  Medication Sig Dispense Refill  . aspirin EC 81 MG tablet Take 1 tablet (81 mg total) by mouth daily.    Marland Kitchen atorvastatin (LIPITOR) 40 MG tablet TAKE 1 TABLET BY MOUTH EVERY DAY AT 6 PM 30 tablet 0  . Coenzyme Q10 200 MG capsule Take 200 mg by mouth every morning. disontinue while in hospital    . escitalopram (LEXAPRO) 10 MG tablet Take 1 tablet (10 mg total) by mouth daily. 60 tablet 2  . ibuprofen (ADVIL,MOTRIN) 200 MG tablet Take 600 mg by mouth every 6 (six) hours as needed. migraine headache    . lisinopril (PRINIVIL,ZESTRIL) 20 MG tablet Take 1 tablet (20 mg total) by mouth daily. 30 tablet 6  . nebivolol (BYSTOLIC) 10 MG tablet Take 10 mg by mouth daily. TAKE 1 TABLET BY MOUTH DAILY    . nitroGLYCERIN (NITROSTAT) 0.4 MG SL  tablet Place 1 tablet (0.4 mg total) under the tongue every 5 (five) minutes as needed for chest pain. 25 tablet 3  . pantoprazole (PROTONIX) 40 MG tablet Take 1 tablet (40 mg total) by mouth 2 (two) times daily before a meal. 60 tablet 3  . zolpidem (AMBIEN) 5 MG tablet Take 1 tablet (5 mg total) by mouth at bedtime as needed for sleep. 30 tablet 1   No current facility-administered medications for this visit.    Past Medical History  Diagnosis Date  . Hypertension   . CAD (coronary artery disease)     a.  LHC 06/26/12:  pLAD 90%, prox/mid/dist CFS 20-30%, oRCA 100% (non  dom), EF 55%;  b. Echo 12/13:  EF 55-60%, Gr 1 diast dysfn;  c. s/p CABG Roxan Hockey) 12/13: L-LAD/Dx    . HLD (hyperlipidemia)   . GERD (gastroesophageal reflux disease)   . Ocular migraine     "maybe once q couple months" (06/02/2014)  . Unstable angina pectoris     Past Surgical History  Procedure Laterality Date  . Nasal septum surgery      For uncontrolled epistaxis early 2013  . Coronary artery bypass graft  06/29/2012    Procedure: CORONARY ARTERY BYPASS GRAFTING (CABG);  Surgeon: Melrose Nakayama, MD;  Location: [place];  Service: Open Heart Surgery;  Laterality: N/A;  times two using Left Internal Mammary Artery  . Appendectomy  1970's  . Inguinal hernia repair Bilateral [phone]  . Cardiac catheterization  06/2012  . Left heart catheterization with coronary angiogram N/A 06/26/2012    Procedure: LEFT HEART CATHETERIZATION WITH CORONARY ANGIOGRAM;  Surgeon: Josue Hector, MD;  Location: [facility] CATH LAB;  Service: Cardiovascular;  Laterality: N/A;  . Left heart catheterization with coronary/graft angiogram N/A 06/03/2014    Procedure: LEFT HEART CATHETERIZATION WITH Beatrix Fetters;  Surgeon: Jettie Booze, MD;  Location: [facility] CATH LAB;  Service: Cardiovascular;  Laterality: N/A;    Family History  Problem Relation Age of Onset  . Heart disease Brother     CABG age 31, redo bypass ~50  . Heart disease Paternal Grandmother   . Heart disease Cousin     Maternal side  . Heart attack Brother   . Colon cancer Maternal Grandfather     History   Social History  . Marital Status: Divorced    Spouse Name: N/A  . Number of Children: N/A  . Years of Education: N/A   Occupational History  . Not on file.   Social History Main Topics  . Smoking status: Never Smoker   . Smokeless tobacco: Never Used  . Alcohol Use: 0.6 oz/week    1 Glasses of wine per week  . Drug Use: No  . Sexual Activity: Not Currently   Other Topics Concern  . Not on file   Social  History Narrative   Divorced 7 years ago   3 children   Works in Aeronautical engineer    ROS:  insomnia and anxiety mild GERD all other symptoms reviewed and negative   Filed Vitals:   09/30/14 1426  BP: 140/82  Pulse: 64     Affect appropriate Healthy:  appears stated age HEENT: normal Neck supple with no adenopathy JVP normal no bruits no thyromegaly Lungs clear with no wheezing and good diaphragmatic motion Heart:  S1/S2 no murmur, no rub, gallop or click PMI normal Abdomen: benighn, BS positve, no tenderness, no AAA no bruit.  No HSM or HJR Distal pulses intact  with no bruits No edema Neuro non-focal Skin warm and dry No muscular weakness    Wt Readings from Last 3 Encounters:  09/30/14 85.73 kg (189 lb)  09/26/14 86.183 kg (190 lb)  06/29/14 86.183 kg (190 lb)    Lab Results  Component Value Date   WBC 7.7 09/29/2014   HGB 15.3 09/29/2014   HCT 43.6 09/29/2014   PLT 217 09/29/2014   GLUCOSE 85 09/29/2014   CHOL 104 11/30/2013   TRIG 55.0 11/30/2013   HDL 34.80* 11/30/2013   LDLCALC 58 11/30/2013   ALT 26 09/29/2014   AST 22 09/29/2014   NA 136 09/29/2014   K 4.0 09/29/2014   CL 101 09/29/2014   CREATININE 1.13 09/29/2014   BUN 14 09/29/2014   CO2 29 09/29/2014   TSH 0.91 01/31/2014   PSA 0.78 01/31/2014   INR 1.17 06/02/2014   HGBA1C 5.6 06/25/2012    EKG: Normal sinus rhythm, no acute change  09/29/14

## 2014-09-30 NOTE — Assessment & Plan Note (Signed)
Cholesterol is at goal.  Continue current dose of statin and diet Rx.  No myalgias or side effects.  F/U  LFT's in 6 months. Lab Results  Component Value Date   LDLCALC 58 11/30/2013

## 2014-09-30 NOTE — Patient Instructions (Signed)
Your physician wants you to follow-up in:  6 MONTHS WITH DR NISHAN  You will receive a reminder letter in the mail two months in advance. If you don't receive a letter, please call our office to schedule the follow-up appointment. Your physician recommends that you continue on your current medications as directed. Please refer to the Current Medication list given to you today. 

## 2014-09-30 NOTE — Assessment & Plan Note (Signed)
F/U 6 months may need to increase lisinopril to 40 mg or add diuretic  Monitor at home continue current meds

## 2014-09-30 NOTE — Assessment & Plan Note (Signed)
Stable Symptoms are very atypical with musclular soreness, GERD fatigue and anxiety  Cath 11/15 with patent LIMA and no significant disease In RCA/Circumflex.  His brother has more advanced disease on disability with [employer] and he is concerned that he will end up there  Continue medical Rx.  Hopefully Lexapro will help  F/U 6 months

## 2014-10-02 ENCOUNTER — Other Ambulatory Visit: Payer: Self-pay | Admitting: Cardiovascular Disease

## 2014-10-03 ENCOUNTER — Encounter: Payer: Self-pay | Admitting: Internal Medicine

## 2014-10-03 ENCOUNTER — Ambulatory Visit (INDEPENDENT_AMBULATORY_CARE_PROVIDER_SITE_OTHER): Payer: BLUE CROSS/BLUE SHIELD | Admitting: Internal Medicine

## 2014-10-03 ENCOUNTER — Telehealth: Payer: Self-pay | Admitting: Internal Medicine

## 2014-10-03 VITALS — BP 150/90 | HR 65 | Temp 98.1°F | Resp 18 | Ht 71.0 in | Wt 187.0 lb

## 2014-10-03 DIAGNOSIS — G4700 Insomnia, unspecified: Secondary | ICD-10-CM

## 2014-10-03 DIAGNOSIS — R072 Precordial pain: Secondary | ICD-10-CM

## 2014-10-03 DIAGNOSIS — R0789 Other chest pain: Secondary | ICD-10-CM

## 2014-10-03 DIAGNOSIS — F4323 Adjustment disorder with mixed anxiety and depressed mood: Secondary | ICD-10-CM

## 2014-10-03 NOTE — Telephone Encounter (Signed)
Noted  

## 2014-10-03 NOTE — Progress Notes (Signed)
Subjective:    Patient ID: Terry Hansen, male    DOB: [birth year], 59 y.o.   MRN: [id number]  HPI  59 year old patient who was seen 1 week ago with insomnia and some situational stress and depressed mood.  He was placed on Ambien, which has been quite helpful for sleep and also on Lexapro 10 mg daily.  Last week he was seen in the ED for nonspecific chest and epigastric pain.  He did have a heart catheterization in November of last year.  He was seen by cardiology in follow-up.  3 days ago. Pain is described as a pressure sensation in the sternal and epigastric area with wide radiation.  He does have a history of GERD on Protonix.  Today he feels well.  Only minimal discomfort yesterday.  No exertional symptoms.  No exercise program, but no pain or difficulty walking up stairs  Past Medical History  Diagnosis Date  . Hypertension   . CAD (coronary artery disease)     a.  LHC 06/26/12:  pLAD 90%, prox/mid/dist CFS 20-30%, oRCA 100% (non dom), EF 55%;  b. Echo 12/13:  EF 55-60%, Gr 1 diast dysfn;  c. s/p CABG Roxan Hockey) 12/13: L-LAD/Dx    . HLD (hyperlipidemia)   . GERD (gastroesophageal reflux disease)   . Ocular migraine     "maybe once q couple months" (06/02/2014)  . Unstable angina pectoris     History   Social History  . Marital Status: Divorced    Spouse Name: N/A  . Number of Children: N/A  . Years of Education: N/A   Occupational History  . Not on file.   Social History Main Topics  . Smoking status: Never Smoker   . Smokeless tobacco: Never Used  . Alcohol Use: 0.6 oz/week    1 Glasses of wine per week  . Drug Use: No  . Sexual Activity: Not Currently   Other Topics Concern  . Not on file   Social History Narrative   Divorced 7 years ago   3 children   Works in Aeronautical engineer    Past Surgical History  Procedure Laterality Date  . Nasal septum surgery      For uncontrolled epistaxis early 2013  . Coronary artery bypass graft  06/29/2012   Procedure: CORONARY ARTERY BYPASS GRAFTING (CABG);  Surgeon: Melrose Nakayama, MD;  Location: [place];  Service: Open Heart Surgery;  Laterality: N/A;  times two using Left Internal Mammary Artery  . Appendectomy  1970's  . Inguinal hernia repair Bilateral [phone]  . Cardiac catheterization  06/2012  . Left heart catheterization with coronary angiogram N/A 06/26/2012    Procedure: LEFT HEART CATHETERIZATION WITH CORONARY ANGIOGRAM;  Surgeon: Josue Hector, MD;  Location: [facility] CATH LAB;  Service: Cardiovascular;  Laterality: N/A;  . Left heart catheterization with coronary/graft angiogram N/A 06/03/2014    Procedure: LEFT HEART CATHETERIZATION WITH Beatrix Fetters;  Surgeon: Jettie Booze, MD;  Location: [facility] CATH LAB;  Service: Cardiovascular;  Laterality: N/A;    Family History  Problem Relation Age of Onset  . Heart disease Brother     CABG age 18, redo bypass ~50  . Heart disease Paternal Grandmother   . Heart disease Cousin     Maternal side  . Heart attack Brother   . Colon cancer Maternal Grandfather     No Known Allergies  Current Outpatient Prescriptions on File Prior to Visit  Medication Sig Dispense Refill  . aspirin EC 81 MG  tablet Take 1 tablet (81 mg total) by mouth daily.    Marland Kitchen atorvastatin (LIPITOR) 40 MG tablet TAKE 1 TABLET BY MOUTH EVERY DAY AT 6 PM 30 tablet 5  . Coenzyme Q10 200 MG capsule Take 200 mg by mouth every morning. disontinue while in hospital    . escitalopram (LEXAPRO) 10 MG tablet Take 1 tablet (10 mg total) by mouth daily. 60 tablet 2  . ibuprofen (ADVIL,MOTRIN) 200 MG tablet Take 600 mg by mouth every 6 (six) hours as needed. migraine headache    . lisinopril (PRINIVIL,ZESTRIL) 20 MG tablet Take 1 tablet (20 mg total) by mouth daily. 30 tablet 6  . nebivolol (BYSTOLIC) 10 MG tablet Take 10 mg by mouth daily. TAKE 1 TABLET BY MOUTH DAILY    . nitroGLYCERIN (NITROSTAT) 0.4 MG SL tablet Place 1 tablet (0.4 mg total) under the tongue every  5 (five) minutes as needed for chest pain. 25 tablet 3  . pantoprazole (PROTONIX) 40 MG tablet Take 1 tablet (40 mg total) by mouth 2 (two) times daily before a meal. 60 tablet 3  . zolpidem (AMBIEN) 5 MG tablet Take 1 tablet (5 mg total) by mouth at bedtime as needed for sleep. 30 tablet 1   No current facility-administered medications on file prior to visit.    BP 150/90 mmHg  Pulse 65  Temp(Src) 98.1 F (36.7 C) (Oral)  Resp 18  Ht 5\' 11"  (1.803 m)  Wt 187 lb (84.823 kg)  BMI 26.09 kg/m2  SpO2 98%     Review of Systems  Constitutional: Negative for fever, chills, appetite change and fatigue.  HENT: Negative for congestion, dental problem, ear pain, hearing loss, sore throat, tinnitus, trouble swallowing and voice change.   Eyes: Negative for pain, discharge and visual disturbance.  Respiratory: Negative for cough, chest tightness, wheezing and stridor.   Cardiovascular: Positive for chest pain. Negative for palpitations and leg swelling.  Gastrointestinal: Negative for nausea, vomiting, abdominal pain, diarrhea, constipation, blood in stool and abdominal distention.  Genitourinary: Negative for urgency, hematuria, flank pain, discharge, difficulty urinating and genital sores.  Musculoskeletal: Negative for myalgias, back pain, joint swelling, arthralgias, gait problem and neck stiffness.  Skin: Negative for rash.  Neurological: Negative for dizziness, syncope, speech difficulty, weakness, numbness and headaches.  Hematological: Negative for adenopathy. Does not bruise/bleed easily.  Psychiatric/Behavioral: Positive for sleep disturbance. Negative for behavioral problems and dysphoric mood. The patient is nervous/anxious.        Objective:   Physical Exam  Constitutional: He is oriented to person, place, and time. He appears well-developed.  HENT:  Head: Normocephalic.  Right Ear: External ear normal.  Left Ear: External ear normal.  Eyes: Conjunctivae and EOM are normal.   Neck: Normal range of motion.  Cardiovascular: Normal rate and normal heart sounds.   Pulmonary/Chest: Effort normal and breath sounds normal. He exhibits no tenderness.  Abdominal: Bowel sounds are normal. He exhibits no distension. There is no tenderness. There is no rebound.  Musculoskeletal: Normal range of motion. He exhibits no edema or tenderness.  Neurological: He is alert and oriented to person, place, and time.  Psychiatric: He has a normal mood and affect. His behavior is normal.          Assessment & Plan:   Adjustment disorder with mixed anxiety and depressed mood.  Doubtful the Lexapro is causing side effects.  Will hold for 2 weeks and rechallenge at that time.  We'll continue when necessary Ambien, which has been  quite helpful Atypical chest pain Hypertension History of GERD.  Continue Protonix  Follow-up as scheduled or as needed

## 2014-10-03 NOTE — Patient Instructions (Signed)
Avoids foods high in acid such as tomatoes citrus juices, and spicy foods.  Avoid eating within two hours of lying down or before exercising.  Do not overheat.  Try smaller more frequent meals.  If symptoms persist, elevate the head of her bed 12 inches while sleeping.  Hold Lexapro for 2 weeks and then resume if no further difficulties  Return as scheduled for follow-up

## 2014-10-03 NOTE — Telephone Encounter (Signed)
[facility] - Client [facility] Patient Name: CHAVIS TESSLER DOB: 12-Sep-1955 Initial Comment Caller states he was seen last week, He was put on Lexapro. Bottom of sterum gets warm, gets pins and needles on his chest and all over his body. Nurse Assessment Nurse: Markus Daft, RN, Sherre Poot Date/Time (Eastern Time): 10/03/2014 11:59:49 AM Confirm and document reason for call. If symptomatic, describe symptoms. ---Caller states he was seen last week by Dr. Estevan Oaks, and he was started on Lexapro d/t depression, anxiety and also given Ambien for insomnia. C/O lower part of sternum gets warm sensation that radiates into his chest 2-3 times a day, but sternum aches all the time, and tender to touch which started Friday. Nausea with it. Lasts about 10 - 15 min. He had one episode pins and needles on his chest and all over his body on Saturday that lasted one minute. -- Currently only his sternum is aching. -- Also seen in ER Thursday for chest pressure. And told nothing r/t his heart. And f/u with cardiologist on Friday and they agreed. Has the patient traveled out of the country within the last 30 days? ---Not Applicable Does the patient require triage? ---Yes Related visit to physician within the last 2 weeks? ---Yes Does the PT have any chronic conditions? (i.e. diabetes, asthma, etc.) ---Yes List chronic conditions. ---CABG 2 years ago, HTN Guidelines Guideline Title Affirmed Question Affirmed Notes Chest Pain Chest pain lasts > 5 minutes (Exceptions: chest pain occurring > 3 days ago and now asymptomatic; same as previously diagnosed heartburn and has accompanying sour taste in mouth) rates 3-4/10 Final Disposition User Go to ED Now (or PCP triage) Markus Daft, RN, Sherre Poot Comments Per directive: office is open, appt scheduled vs sending patient to the ED. -- Joycelyn Schmid at office made appt for pt at 1:45 pm today  with  Dr. Estevan Oaks. Pt aware.

## 2014-10-19 ENCOUNTER — Telehealth: Payer: Self-pay | Admitting: Internal Medicine

## 2014-10-19 MED ORDER — NEBIVOLOL HCL 10 MG PO TABS: 10.0000 mg | ORAL_TABLET | Freq: Every day | ORAL | Status: DC

## 2014-10-19 NOTE — Telephone Encounter (Signed)
Pt needs refill on bystolic 10 mg #34 w/refills

## 2014-10-19 NOTE — Telephone Encounter (Signed)
rx sent in electronically 

## 2014-10-24 ENCOUNTER — Encounter: Payer: Self-pay | Admitting: Internal Medicine

## 2014-10-24 ENCOUNTER — Ambulatory Visit (INDEPENDENT_AMBULATORY_CARE_PROVIDER_SITE_OTHER): Payer: BLUE CROSS/BLUE SHIELD | Admitting: Internal Medicine

## 2014-10-24 VITALS — BP 140/90 | HR 68 | Temp 98.6°F | Wt 188.7 lb

## 2014-10-24 DIAGNOSIS — F32A Depression, unspecified: Secondary | ICD-10-CM | POA: Insufficient documentation

## 2014-10-24 DIAGNOSIS — F419 Anxiety disorder, unspecified: Principal | ICD-10-CM

## 2014-10-24 DIAGNOSIS — F418 Other specified anxiety disorders: Secondary | ICD-10-CM | POA: Diagnosis not present

## 2014-10-24 DIAGNOSIS — F329 Major depressive disorder, single episode, unspecified: Secondary | ICD-10-CM | POA: Insufficient documentation

## 2014-10-24 MED ORDER — ESCITALOPRAM OXALATE 10 MG PO TABS: 10.0000 mg | ORAL_TABLET | Freq: Every day | ORAL | Status: DC

## 2014-10-24 MED ORDER — ZOLPIDEM TARTRATE 5 MG PO TABS: 5.0000 mg | ORAL_TABLET | Freq: Every evening | ORAL | Status: DC | PRN

## 2014-10-24 NOTE — Progress Notes (Signed)
Subjective:    Patient ID: Terry Hansen, male    DOB: September 18, 1955, 59 y.o.   MRN: [id number]  HPI  59 year old white male for follow-up regarding anxiety/depression and chest pain. Previous records reviewed. Patient evaluated by cardiology. Their workup negative for ischemia.    Patient seen by Dr. Burnice Logan for symptoms of anxiety and depression. Patient started on Lexapro 10 mg and Ambien 5 mg at bedtime. Patient reports his anxiety symptoms and upper chest discomfort has significantly improved. He denies any weight gain since starting Lexapro. He denies any sexual dysfunction.   Review of Systems Negative for significant weight gain. No change in appetite.    Past Medical History  Diagnosis Date  . Hypertension   . CAD (coronary artery disease)     a.  LHC 06/26/12:  pLAD 90%, prox/mid/dist CFS 20-30%, oRCA 100% (non dom), EF 55%;  b. Echo 12/13:  EF 55-60%, Gr 1 diast dysfn;  c. s/p CABG Roxan Hockey) 12/13: L-LAD/Dx    . HLD (hyperlipidemia)   . GERD (gastroesophageal reflux disease)   . Ocular migraine     "maybe once q couple months" (06/02/2014)  . Unstable angina pectoris     History   Social History  . Marital Status: Divorced    Spouse Name: N/A  . Number of Children: N/A  . Years of Education: N/A   Occupational History  . Not on file.   Social History Main Topics  . Smoking status: Never Smoker   . Smokeless tobacco: Never Used  . Alcohol Use: 0.6 oz/week    1 Glasses of wine per week  . Drug Use: No  . Sexual Activity: Not Currently   Other Topics Concern  . Not on file   Social History Narrative   Divorced 7 years ago   3 children   Works in Aeronautical engineer    Past Surgical History  Procedure Laterality Date  . Nasal septum surgery      For uncontrolled epistaxis early 2013  . Coronary artery bypass graft  06/29/2012    Procedure: CORONARY ARTERY BYPASS GRAFTING (CABG);  Surgeon: Melrose Nakayama, MD;  Location: [facility];  Service:  Open Heart Surgery;  Laterality: N/A;  times two using Left Internal Mammary Artery  . Appendectomy  1970's  . Inguinal hernia repair Bilateral [phone]  . Cardiac catheterization  06/2012  . Left heart catheterization with coronary angiogram N/A 06/26/2012    Procedure: LEFT HEART CATHETERIZATION WITH CORONARY ANGIOGRAM;  Surgeon: Josue Hector, MD;  Location: [facility] CATH LAB;  Service: Cardiovascular;  Laterality: N/A;  . Left heart catheterization with coronary/graft angiogram N/A 06/03/2014    Procedure: LEFT HEART CATHETERIZATION WITH Beatrix Fetters;  Surgeon: Jettie Booze, MD;  Location: [facility] CATH LAB;  Service: Cardiovascular;  Laterality: N/A;    Family History  Problem Relation Age of Onset  . Heart disease Brother     CABG age 85, redo bypass ~50  . Heart disease Paternal Grandmother   . Heart disease Cousin     Maternal side  . Heart attack Brother   . Colon cancer Maternal Grandfather     No Known Allergies  Current Outpatient Prescriptions on File Prior to Visit  Medication Sig Dispense Refill  . aspirin EC 81 MG tablet Take 1 tablet (81 mg total) by mouth daily.    Marland Kitchen atorvastatin (LIPITOR) 40 MG tablet TAKE 1 TABLET BY MOUTH EVERY DAY AT 6 PM 30 tablet 5  . Coenzyme Q10 200  MG capsule Take 200 mg by mouth every morning. disontinue while in hospital    . ibuprofen (ADVIL,MOTRIN) 200 MG tablet Take 600 mg by mouth every 6 (six) hours as needed. migraine headache    . lisinopril (PRINIVIL,ZESTRIL) 20 MG tablet Take 1 tablet (20 mg total) by mouth daily. 30 tablet 6  . nebivolol (BYSTOLIC) 10 MG tablet Take 1 tablet (10 mg total) by mouth daily. TAKE 1 TABLET BY MOUTH DAILY 30 tablet 3  . nitroGLYCERIN (NITROSTAT) 0.4 MG SL tablet Place 1 tablet (0.4 mg total) under the tongue every 5 (five) minutes as needed for chest pain. 25 tablet 3  . pantoprazole (PROTONIX) 40 MG tablet Take 1 tablet (40 mg total) by mouth 2 (two) times daily before a meal. 60 tablet 3    No current facility-administered medications on file prior to visit.    BP 140/90 mmHg  Pulse 68  Temp(Src) 98.6 F (37 C) (Oral)  Wt 188 lb 11.2 oz (85.594 kg)    Objective:   Physical Exam  Constitutional: He is oriented to person, place, and time. He appears well-developed and well-nourished.  Cardiovascular: Normal rate, regular rhythm and normal heart sounds.   No murmur heard. Pulmonary/Chest: Effort normal and breath sounds normal. He has no wheezes.  Neurological: He is alert and oriented to person, place, and time. No cranial nerve deficit.  Psychiatric: He has a normal mood and affect. His behavior is normal.          Assessment & Plan:

## 2014-10-24 NOTE — Assessment & Plan Note (Addendum)
Patient has been experiencing anxiety and depression ever since his CABG.  His symptoms exacerbated by work stress. Good response to Lexapro 10 mg. His PHQ9 - 9.  Continue same dose of Lexapro. Use ambien as needed.  Reassess in 4 months.

## 2014-10-24 NOTE — Progress Notes (Signed)
Pre visit review using our clinic review tool, if applicable. No additional management support is needed unless otherwise documented below in the visit note. 

## 2014-10-27 ENCOUNTER — Ambulatory Visit: Payer: Self-pay | Admitting: Cardiovascular Disease

## 2014-11-17 ENCOUNTER — Ambulatory Visit (INDEPENDENT_AMBULATORY_CARE_PROVIDER_SITE_OTHER): Payer: BLUE CROSS/BLUE SHIELD | Admitting: Family Medicine

## 2014-11-17 ENCOUNTER — Encounter: Payer: Self-pay | Admitting: Family Medicine

## 2014-11-17 VITALS — BP 130/80 | HR 57 | Temp 97.5°F | Wt 187.0 lb

## 2014-11-17 DIAGNOSIS — R102 Pelvic and perineal pain: Secondary | ICD-10-CM

## 2014-11-17 DIAGNOSIS — N509 Disorder of male genital organs, unspecified: Secondary | ICD-10-CM | POA: Diagnosis not present

## 2014-11-17 MED ORDER — CIPROFLOXACIN HCL 500 MG PO TABS: 500.0000 mg | ORAL_TABLET | Freq: Two times a day (BID) | ORAL | Status: DC

## 2014-11-17 NOTE — Progress Notes (Signed)
Pre visit review using our clinic review tool, if applicable. No additional management support is needed unless otherwise documented below in the visit note. 

## 2014-11-17 NOTE — Patient Instructions (Signed)

## 2014-11-17 NOTE — Progress Notes (Signed)
   Subjective:    Patient ID: Terry Hansen, male    DOB: [birth year], 59 y.o.   MRN: [id number]  HPI  Patient seen with vague 2 week history of somewhat nondescript achy pain back behind his scrotal region. Has not noted any scrotal pain and no hernia. No dysuria. No pain with bowel movement. No bloody stools. His pain is better with walking and somewhat worse with prolonged sitting. He had PSA last summer 0.78. No dysuria. No fevers or chills. He has some skin tags but no painful external hemorrhoids.  Past Medical History  Diagnosis Date  . Hypertension   . CAD (coronary artery disease)     a.  LHC 06/26/12:  pLAD 90%, prox/mid/dist CFS 20-30%, oRCA 100% (non dom), EF 55%;  b. Echo 12/13:  EF 55-60%, Gr 1 diast dysfn;  c. s/p CABG Roxan Hockey) 12/13: L-LAD/Dx    . HLD (hyperlipidemia)   . GERD (gastroesophageal reflux disease)   . Ocular migraine     "maybe once q couple months" (06/02/2014)  . Unstable angina pectoris    Past Surgical History  Procedure Laterality Date  . Nasal septum surgery      For uncontrolled epistaxis early 2013  . Coronary artery bypass graft  06/29/2012    Procedure: CORONARY ARTERY BYPASS GRAFTING (CABG);  Surgeon: Melrose Nakayama, MD;  Location: [place];  Service: Open Heart Surgery;  Laterality: N/A;  times two using Left Internal Mammary Artery  . Appendectomy  1970's  . Inguinal hernia repair Bilateral [phone]  . Cardiac catheterization  06/2012  . Left heart catheterization with coronary angiogram N/A 06/26/2012    Procedure: LEFT HEART CATHETERIZATION WITH CORONARY ANGIOGRAM;  Surgeon: Josue Hector, MD;  Location: [facility] CATH LAB;  Service: Cardiovascular;  Laterality: N/A;  . Left heart catheterization with coronary/graft angiogram N/A 06/03/2014    Procedure: LEFT HEART CATHETERIZATION WITH Beatrix Fetters;  Surgeon: Jettie Booze, MD;  Location: [facility] CATH LAB;  Service: Cardiovascular;  Laterality: N/A;    reports that he has never  smoked. He has never used smokeless tobacco. He reports that he drinks about 0.6 oz of alcohol per week. He reports that he does not use illicit drugs. family history includes Colon cancer in his maternal grandfather; Heart attack in his brother; Heart disease in his brother, cousin, and paternal grandmother. No Known Allergies   Review of Systems  Constitutional: Negative for fever, chills, appetite change and unexpected weight change.  Gastrointestinal: Negative for diarrhea, constipation and blood in stool.  Genitourinary: Negative for dysuria.       Objective:   Physical Exam  Constitutional: He appears well-developed.  Cardiovascular: Normal rate and regular rhythm.   Pulmonary/Chest: Effort normal and breath sounds normal. No respiratory distress. He has no wheezes. He has no rales.  Genitourinary:  Nonspecific external skin tags but no thrombosed hemorrhoid. No evidence for perianal abscess. Rectal exam prostate is normal size. Nonenlarged. Slightly tender to palpation. Not boggy. No hernia  Skin: No rash noted.          Assessment & Plan:  Patient presents with vague pain behind the scrotal region. Question early prostatitis. No evidence for perianal abscess or thrombosed hemorrhoids. Cipro 500 milligrams twice a day for 10 days. Warm sitz baths once or twice daily. Follow-up in 2 weeks if symptoms not resolving.

## 2015-01-27 ENCOUNTER — Other Ambulatory Visit: Payer: Self-pay | Admitting: Physician Assistant

## 2015-02-20 ENCOUNTER — Encounter: Payer: Self-pay | Admitting: Internal Medicine

## 2015-02-20 ENCOUNTER — Ambulatory Visit (INDEPENDENT_AMBULATORY_CARE_PROVIDER_SITE_OTHER): Payer: BLUE CROSS/BLUE SHIELD | Admitting: Internal Medicine

## 2015-02-20 VITALS — BP 130/90 | Temp 98.9°F | Wt 191.0 lb

## 2015-02-20 DIAGNOSIS — F329 Major depressive disorder, single episode, unspecified: Secondary | ICD-10-CM

## 2015-02-20 DIAGNOSIS — F418 Other specified anxiety disorders: Secondary | ICD-10-CM | POA: Diagnosis not present

## 2015-02-20 DIAGNOSIS — F32A Depression, unspecified: Secondary | ICD-10-CM

## 2015-02-20 DIAGNOSIS — I10 Essential (primary) hypertension: Secondary | ICD-10-CM | POA: Diagnosis not present

## 2015-02-20 DIAGNOSIS — F419 Anxiety disorder, unspecified: Principal | ICD-10-CM

## 2015-02-20 MED ORDER — ATORVASTATIN CALCIUM 40 MG PO TABS: ORAL_TABLET | ORAL | Status: DC

## 2015-02-20 MED ORDER — NEBIVOLOL HCL 10 MG PO TABS: 10.0000 mg | ORAL_TABLET | Freq: Every day | ORAL | Status: DC

## 2015-02-20 MED ORDER — ESCITALOPRAM OXALATE 10 MG PO TABS: 10.0000 mg | ORAL_TABLET | Freq: Every day | ORAL | Status: DC

## 2015-02-20 MED ORDER — LISINOPRIL 20 MG PO TABS: 20.0000 mg | ORAL_TABLET | Freq: Every day | ORAL | Status: DC

## 2015-02-20 NOTE — Progress Notes (Signed)
Pre visit review using our clinic review tool, if applicable. No additional management support is needed unless otherwise documented below in the visit note. 

## 2015-02-20 NOTE — Progress Notes (Signed)
Subjective:    Patient ID: Terry Hansen, male    DOB: May 23, 1956, 59 y.o.   MRN: [id number]  HPI  59 year old white male with history of coronary artery disease, hypertension, anxiety/depression for follow-up.  Interval medical history patient seen by Dr. Elease Hashimoto in late April 2016 for possible prosatitis. Patient reports his symptoms improved after course of Cipro.  Anxiety/depression - Patient still struggling with stress at work however his symptoms are less intense with use of Lexapro. He rarely has panic symptoms.  He uses ambien 2-3 times per week.  Review of Systems Mild weight gain    Past Medical History  Diagnosis Date  . Hypertension   . CAD (coronary artery disease)     a.  LHC 06/26/12:  pLAD 90%, prox/mid/dist CFS 20-30%, oRCA 100% (non dom), EF 55%;  b. Echo 12/13:  EF 55-60%, Gr 1 diast dysfn;  c. s/p CABG Roxan Hockey) 12/13: L-LAD/Dx    . HLD (hyperlipidemia)   . GERD (gastroesophageal reflux disease)   . Ocular migraine     "maybe once q couple months" (06/02/2014)  . Unstable angina pectoris     History   Social History  . Marital Status: Divorced    Spouse Name: N/A  . Number of Children: N/A  . Years of Education: N/A   Occupational History  . Not on file.   Social History Main Topics  . Smoking status: Never Smoker   . Smokeless tobacco: Never Used  . Alcohol Use: 0.6 oz/week    1 Glasses of wine per week  . Drug Use: No  . Sexual Activity: Not Currently   Other Topics Concern  . Not on file   Social History Narrative   Divorced 7 years ago   3 children   Works in Aeronautical engineer    Past Surgical History  Procedure Laterality Date  . Nasal septum surgery      For uncontrolled epistaxis early 2013  . Coronary artery bypass graft  06/29/2012    Procedure: CORONARY ARTERY BYPASS GRAFTING (CABG);  Surgeon: Melrose Nakayama, MD;  Location: [place];  Service: Open Heart Surgery;  Laterality: N/A;  times two using Left Internal  Mammary Artery  . Appendectomy  1970's  . Inguinal hernia repair Bilateral [phone]  . Cardiac catheterization  06/2012  . Left heart catheterization with coronary angiogram N/A 06/26/2012    Procedure: LEFT HEART CATHETERIZATION WITH CORONARY ANGIOGRAM;  Surgeon: Josue Hector, MD;  Location: [facility] CATH LAB;  Service: Cardiovascular;  Laterality: N/A;  . Left heart catheterization with coronary/graft angiogram N/A 06/03/2014    Procedure: LEFT HEART CATHETERIZATION WITH Beatrix Fetters;  Surgeon: Jettie Booze, MD;  Location: [facility] CATH LAB;  Service: Cardiovascular;  Laterality: N/A;    Family History  Problem Relation Age of Onset  . Heart disease Brother     CABG age 40, redo bypass ~50  . Heart disease Paternal Grandmother   . Heart disease Cousin     Maternal side  . Heart attack Brother   . Colon cancer Maternal Grandfather     No Known Allergies  Current Outpatient Prescriptions on File Prior to Visit  Medication Sig Dispense Refill  . aspirin EC 81 MG tablet Take 1 tablet (81 mg total) by mouth daily.    . Coenzyme Q10 200 MG capsule Take 200 mg by mouth every morning. disontinue while in hospital    . ibuprofen (ADVIL,MOTRIN) 200 MG tablet Take 600 mg by mouth every 6 (  six) hours as needed. migraine headache    . nitroGLYCERIN (NITROSTAT) 0.4 MG SL tablet Place 1 tablet (0.4 mg total) under the tongue every 5 (five) minutes as needed for chest pain. 25 tablet 3  . zolpidem (AMBIEN) 5 MG tablet Take 1 tablet (5 mg total) by mouth at bedtime as needed for sleep. 30 tablet 3   No current facility-administered medications on file prior to visit.    BP 130/90 mmHg  Temp(Src) 98.9 F (37.2 C) (Oral)  Wt 191 lb (86.637 kg)    Objective:   Physical Exam  Constitutional: He is oriented to person, place, and time. He appears well-developed and well-nourished. No distress.  Cardiovascular: Normal rate, regular rhythm and normal heart sounds.   No murmur  heard. Pulmonary/Chest: Effort normal and breath sounds normal. He has no wheezes.  Neurological: He is alert and oriented to person, place, and time. No cranial nerve deficit.  Skin: Skin is warm and dry.  Psychiatric: He has a normal mood and affect. His behavior is normal.       Assessment & Plan:   1. Anxiety and depression 2. Hypertension  Patient symptoms are stable. Continue same dose of Lexapro. Patient advised to monitor his weight. We discussed weight loss strategies. He understands possible side effect of mild weight gain with use of SSRIs.  Patient's blood pressure is well-controlled. Continue same dose of lisinopril and Bystolic.

## 2015-02-20 NOTE — Patient Instructions (Signed)
Please complete the following lab tests before your next follow up appointment: BMET, CBCD - 401.9 FLP, LFTs, TSH - 272.4 

## 2015-04-27 IMAGING — CR DG CHEST 1V PORT
1 series · 1 of 1 positions shown · non-contrast
Comparison: 08/12/2012.

CLINICAL DATA: CABG.  Chest pressure.  History of hypertension.

PORTABLE CHEST - 1 VIEW

[view not recorded]
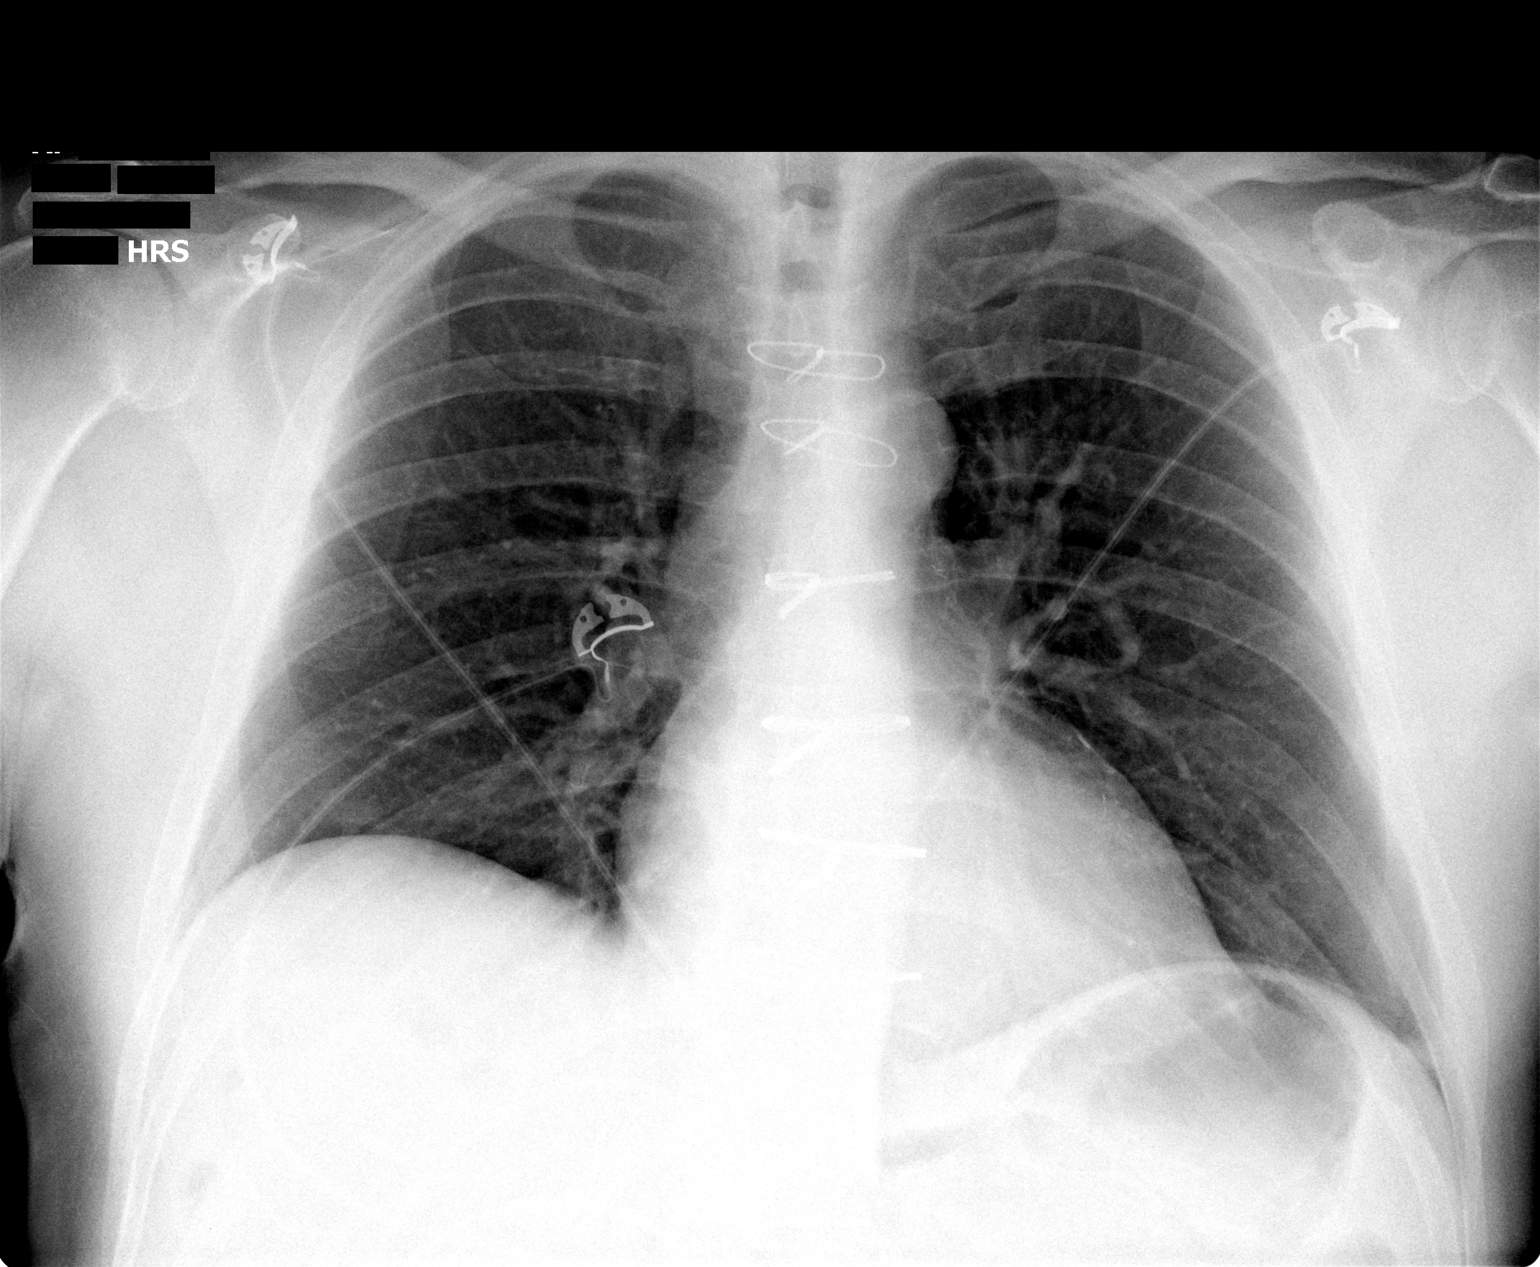

[1 of 1 positions shown; findings below may reference images not displayed]

FINDINGS: Cardiopericardial silhouette appears within normal
limits. Monitoring leads are projected over the chest.  Mediastinal
contours normal.  No airspace disease.  No effusion.
IMPRESSION: Median sternotomy.  No acute cardiopulmonary disease.

## 2015-05-11 ENCOUNTER — Ambulatory Visit (INDEPENDENT_AMBULATORY_CARE_PROVIDER_SITE_OTHER): Payer: 59 | Admitting: Family Medicine

## 2015-05-11 ENCOUNTER — Encounter: Payer: Self-pay | Admitting: Family Medicine

## 2015-05-11 VITALS — BP 126/88 | HR 64 | Temp 97.8°F | Ht 71.0 in | Wt 193.6 lb

## 2015-05-11 DIAGNOSIS — F329 Major depressive disorder, single episode, unspecified: Secondary | ICD-10-CM

## 2015-05-11 DIAGNOSIS — F32A Depression, unspecified: Secondary | ICD-10-CM

## 2015-05-11 DIAGNOSIS — J069 Acute upper respiratory infection, unspecified: Secondary | ICD-10-CM | POA: Diagnosis not present

## 2015-05-11 DIAGNOSIS — F418 Other specified anxiety disorders: Secondary | ICD-10-CM | POA: Diagnosis not present

## 2015-05-11 DIAGNOSIS — F419 Anxiety disorder, unspecified: Secondary | ICD-10-CM

## 2015-05-11 NOTE — Progress Notes (Signed)
HPI:  Acute visit for:  1) URI: -started 4-5 days ago -symptoms: sore throat, PND, sneezing, ear pressure -denies: SOB, fevers, sinus pain, tooth pain, body aches, malaise, NVD -has tried NSAID which helps -sick contacts: multiple with "cold"  2)GAD: -reports started after open heart surgery -on lexapro and now reosolved and wants to wean off this medication -denies depression, panic  ROS: See pertinent positives and negatives per HPI.  Past Medical History  Diagnosis Date  . Hypertension   . CAD (coronary artery disease)     a.  LHC 06/26/12:  pLAD 90%, prox/mid/dist CFS 20-30%, oRCA 100% (non dom), EF 55%;  b. Echo 12/13:  EF 55-60%, Gr 1 diast dysfn;  c. s/p CABG Roxan Hockey) 12/13: L-LAD/Dx    . HLD (hyperlipidemia)   . GERD (gastroesophageal reflux disease)   . Ocular migraine     "maybe once q couple months" (06/02/2014)  . Unstable angina pectoris [facility])     Past Surgical History  Procedure Laterality Date  . Nasal septum surgery      For uncontrolled epistaxis early 2013  . Coronary artery bypass graft  06/29/2012    Procedure: CORONARY ARTERY BYPASS GRAFTING (CABG);  Surgeon: Melrose Nakayama, MD;  Location: [place];  Service: Open Heart Surgery;  Laterality: N/A;  times two using Left Internal Mammary Artery  . Appendectomy  1970's  . Inguinal hernia repair Bilateral [phone]  . Cardiac catheterization  06/2012  . Left heart catheterization with coronary angiogram N/A 06/26/2012    Procedure: LEFT HEART CATHETERIZATION WITH CORONARY ANGIOGRAM;  Surgeon: Josue Hector, MD;  Location: [facility] CATH LAB;  Service: Cardiovascular;  Laterality: N/A;  . Left heart catheterization with coronary/graft angiogram N/A 06/03/2014    Procedure: LEFT HEART CATHETERIZATION WITH Beatrix Fetters;  Surgeon: Jettie Booze, MD;  Location: [facility] CATH LAB;  Service: Cardiovascular;  Laterality: N/A;    Family History  Problem Relation Age of Onset  . Heart disease Brother      CABG age 65, redo bypass ~50  . Heart disease Paternal Grandmother   . Heart disease Cousin     Maternal side  . Heart attack Brother   . Colon cancer Maternal Grandfather     Social History   Social History  . Marital Status: Divorced    Spouse Name: N/A  . Number of Children: N/A  . Years of Education: N/A   Social History Main Topics  . Smoking status: Never Smoker   . Smokeless tobacco: Never Used  . Alcohol Use: 0.6 oz/week    1 Glasses of wine per week  . Drug Use: No  . Sexual Activity: Not Currently   Other Topics Concern  . None   Social History Narrative   Divorced 7 years ago   3 children   Works in Aeronautical engineer     Current outpatient prescriptions:  .  aspirin EC 81 MG tablet, Take 1 tablet (81 mg total) by mouth daily., Disp: , Rfl:  .  atorvastatin (LIPITOR) 40 MG tablet, TAKE 1 TABLET BY MOUTH EVERY DAY AT 6 PM, Disp: 90 tablet, Rfl: 1 .  Coenzyme Q10 200 MG capsule, Take 200 mg by mouth every morning. disontinue while in hospital, Disp: , Rfl:  .  escitalopram (LEXAPRO) 10 MG tablet, Take 1 tablet (10 mg total) by mouth daily., Disp: 90 tablet, Rfl: 1 .  ibuprofen (ADVIL,MOTRIN) 200 MG tablet, Take 600 mg by mouth every 6 (six) hours as needed. migraine headache,  Disp: , Rfl:  .  lisinopril (PRINIVIL,ZESTRIL) 20 MG tablet, Take 1 tablet (20 mg total) by mouth daily., Disp: 90 tablet, Rfl: 1 .  nebivolol (BYSTOLIC) 10 MG tablet, Take 1 tablet (10 mg total) by mouth daily. TAKE 1 TABLET BY MOUTH DAILY, Disp: 90 tablet, Rfl: 1 .  nitroGLYCERIN (NITROSTAT) 0.4 MG SL tablet, Place 1 tablet (0.4 mg total) under the tongue every 5 (five) minutes as needed for chest pain., Disp: 25 tablet, Rfl: 3 .  zolpidem (AMBIEN) 5 MG tablet, Take 1 tablet (5 mg total) by mouth at bedtime as needed for sleep., Disp: 30 tablet, Rfl: 3  EXAM:  Filed Vitals:   05/11/15 1406  BP: 126/88  Pulse: 64  Temp: 97.8 F (36.6 C)    Body mass index is 27.01  kg/(m^2).  GENERAL: vitals reviewed and listed above, alert, oriented, appears well hydrated and in no acute distress  HEENT: atraumatic, conjunttiva clear, no obvious abnormalities on inspection of external nose and ears,   HPI:  ROS: See pertinent positives and negatives per HPI.  Past Medical History  Diagnosis Date  . Hypertension   . CAD (coronary artery disease)     a.  LHC 06/26/12:  pLAD 90%, prox/mid/dist CFS 20-30%, oRCA 100% (non dom), EF 55%;  b. Echo 12/13:  EF 55-60%, Gr 1 diast dysfn;  c. s/p CABG [facility] Hockey) 12/13: L-LAD/Dx    . HLD (hyperlipidemia)   . GERD (gastroesophageal reflux disease)   . Ocular migraine     "maybe once q couple months" (06/02/2014)  . Unstable angina pectoris [facility])     Past Surgical History  Procedure Laterality Date  . Nasal septum surgery      For uncontrolled epistaxis early 2013  . Coronary artery bypass graft  06/29/2012    Procedure: CORONARY ARTERY BYPASS GRAFTING (CABG);  Surgeon: Melrose Nakayama, MD;  Location: [place];  Service: Open Heart Surgery;  Laterality: N/A;  times two using Left Internal Mammary Artery  . Appendectomy  1970's  . Inguinal hernia repair Bilateral [phone]  . Cardiac catheterization  06/2012  . Left heart catheterization with coronary angiogram N/A 06/26/2012    Procedure: LEFT HEART CATHETERIZATION WITH CORONARY ANGIOGRAM;  Surgeon: Josue Hector, MD;  Location: [facility] CATH LAB;  Service: Cardiovascular;  Laterality: N/A;  . Left heart catheterization with coronary/graft angiogram N/A 06/03/2014    Procedure: LEFT HEART CATHETERIZATION WITH Beatrix Fetters;  Surgeon: Jettie Booze, MD;  Location: [facility] CATH LAB;  Service: Cardiovascular;  Laterality: N/A;    Family History  Problem Relation Age of Onset  . Heart disease Brother     CABG age 63, redo bypass ~50  . Heart disease Paternal Grandmother   . Heart disease Cousin     Maternal side  . Heart attack Brother   . Colon cancer  Maternal Grandfather     Social History   Social History  . Marital Status: Divorced    Spouse Name: N/A  . Number of Children: N/A  . Years of Education: N/A   Social History Main Topics  . Smoking status: Never Smoker   . Smokeless tobacco: Never Used  . Alcohol Use: 0.6 oz/week    1 Glasses of wine per week  . Drug Use: No  . Sexual Activity: Not Currently   Other Topics Concern  . None   Social History Narrative   Divorced 7 years ago   3 children   Works in Aeronautical engineer  Current outpatient prescriptions:  .  aspirin EC 81 MG tablet, Take 1 tablet (81 mg total) by mouth daily., Disp: , Rfl:  .  atorvastatin (LIPITOR) 40 MG tablet, TAKE 1 TABLET BY MOUTH EVERY DAY AT 6 PM, Disp: 90 tablet, Rfl: 1 .  Coenzyme Q10 200 MG capsule, Take 200 mg by mouth every morning. disontinue while in hospital, Disp: , Rfl:  .  escitalopram (LEXAPRO) 10 MG tablet, Take 1 tablet (10 mg total) by mouth daily., Disp: 90 tablet, Rfl: 1 .  ibuprofen (ADVIL,MOTRIN) 200 MG tablet, Take 600 mg by mouth every 6 (six) hours as needed. migraine headache, Disp: , Rfl:  .  lisinopril (PRINIVIL,ZESTRIL) 20 MG tablet, Take 1 tablet (20 mg total) by mouth daily., Disp: 90 tablet, Rfl: 1 .  nebivolol (BYSTOLIC) 10 MG tablet, Take 1 tablet (10 mg total) by mouth daily. TAKE 1 TABLET BY MOUTH DAILY, Disp: 90 tablet, Rfl: 1 .  nitroGLYCERIN (NITROSTAT) 0.4 MG SL tablet, Place 1 tablet (0.4 mg total) under the tongue every 5 (five) minutes as needed for chest pain., Disp: 25 tablet, Rfl: 3 .  zolpidem (AMBIEN) 5 MG tablet, Take 1 tablet (5 mg total) by mouth at bedtime as needed for sleep., Disp: 30 tablet, Rfl: 3  EXAM:  Filed Vitals:   05/11/15 1406  BP: 126/88  Pulse: 64  Temp: 97.8 F (36.6 C)    Body mass index is 27.01 kg/(m^2).  GENERAL: vitals reviewed and listed above, alert, oriented, appears well hydrated and in no acute distress  HEENT: atraumatic, conjunttiva clear, no  obvious abnormalities on inspection of external nose and ears  NECK: no obvious masses on inspection  LUNGS: clear to auscultation bilaterally, no wheezes, rales or rhonchi, good air movement  CV: HRRR, no peripheral edema  MS: moves all extremities without noticeable abnormality  PSYCH: pleasant and cooperative, no obvious depression or anxiety  ASSESSMENT AND PLAN:  Discussed the following assessment and plan:  Acute upper respiratory infection  Anxiety and depression  -Patient advised to return or notify a doctor immediately if symptoms worsen or persist or new concerns arise.  There are no Patient Instructions on file for this visit.   Colin Benton R.    NECK: no obvious masses on inspection  LUNGS: clear to auscultation bilaterally, no wheezes, rales or rhonchi, good air movement  CV: HRRR, no peripheral edema  MS: moves all extremities without noticeable abnormality  PSYCH: pleasant and cooperative, no obvious depression or anxiety  ASSESSMENT AND PLAN:  Discussed the following assessment and plan:  Acute upper respiratory infection -suspect vuri, other etiologies and return precautions discussed, supportive care  Anxiety and depression -taper off lexapro slowly, follow up as needed  -Patient advised to return or notify a doctor immediately if symptoms worsen or persist or new concerns arise.  There are no Patient Instructions on file for this visit.   Colin Benton R.

## 2015-05-11 NOTE — Patient Instructions (Signed)
INSTRUCTIONS FOR UPPER RESPIRATORY INFECTION:  -plenty of rest and fluids  -nasal saline wash 2-3 times daily (use prepackaged nasal saline or bottled/distilled water if making your own)   -can use AFRIN nasal spray for drainage and nasal congestion - but do NOT use longer then 3-4 days  -can use tylenol (in no history of liver disease) or ibuprofen (if no history of kidney disease, bowel bleeding or significant heart disease) as directed for aches and sorethroat  -in the winter time, using a humidifier at night is helpful (please follow cleaning instructions)  -if you are taking a cough medication - use only as directed, may also try a teaspoon of honey to coat the throat and throat lozenges. If given a cough medication with codeine or hydrocodone or other narcotic please be advised that this contains a strong and  potentially addicting medication. Please follow instructions carefully, take as little as possible and only use AS NEEDED for severe cough. Discuss potential side effects with your pharmacy. Please do not drive or operate machinery while taking these types of medications. Please do not take other sedating medications, drugs or alcohol while taking this medication without discussing with your doctor.  -for sore throat, salt water gargles can help  -follow up if you have fevers, facial pain, tooth pain, difficulty breathing or are worsening or symptoms persist longer then expected  Upper Respiratory Infection, Adult An upper respiratory infection (URI) is also known as the common cold. It is often caused by a type of germ (virus). Colds are easily spread (contagious). You can pass it to others by kissing, coughing, sneezing, or drinking out of the same glass. Usually, you get better in 1 to 3  weeks.  However, the cough can last for even longer. HOME CARE   Only take medicine as told by your doctor. Follow instructions provided above.  Drink enough water and fluids to keep your pee  (urine) clear or pale yellow.  Get plenty of rest.  Return to work when your temperature is < 100 for 24 hours or as told by your doctor. You may use a face mask and wash your hands to stop your cold from spreading. GET HELP RIGHT AWAY IF:   After the first few days, you feel you are getting worse.  You have questions about your medicine.  You have chills, shortness of breath, or red spit (mucus).  You have pain in the face for more then 1-2 days, especially when you bend forward.  You have a fever, puffy (swollen) neck, pain when you swallow, or white spots in the back of your throat.  You have a bad headache, ear pain, sinus pain, or chest pain.  You have a high-pitched whistling sound when you breathe in and out (wheezing).  You cough up blood.  You have sore muscles or a stiff neck. MAKE SURE YOU:   Understand these instructions.  Will watch your condition.  Will get help right away if you are not doing well or get worse. Document Released: 12/25/2007 Document Revised: 09/30/2011 Document Reviewed: 10/13/2013 [facility] Patient Information 2015 [place], [place]. This information is not intended to replace advice given to you by your health care provider. Make sure you discuss any questions you have with your health care provider.   Taper off of the lexapro slowly as discussed and follow up as needed if any concerns.

## 2015-05-11 NOTE — Progress Notes (Signed)
Pre visit review using our clinic review tool, if applicable. No additional management support is needed unless otherwise documented below in the visit note. 

## 2015-06-01 ENCOUNTER — Telehealth: Payer: Self-pay | Admitting: Internal Medicine

## 2015-06-01 NOTE — Telephone Encounter (Signed)
FYI

## 2015-06-01 NOTE — Telephone Encounter (Signed)
[facility] - Client [facility] Patient Name: Terry Hansen DOB: 11/27/1955 Initial Comment Caller states saw Dr Maudie Mercury 10/20 for sore throat, stuffy nose; fighting it since; how long to get over? Nurse Assessment Nurse: Markus Daft, RN, Sherre Poot Date/Time Eilene Ghazi Time): 06/01/2015 11:20:41 AM Confirm and document reason for call. If symptomatic, describe symptoms. ---Caller states sinus and throat irritation/scratchiness, nasal stuffiness, mild productive (clear phlegm) cough. Denies sore throat, sinus pain. No fever. He was getting better, but now seems to be moving back into his head instead of his chest. He has cont. to have s/s, and wondered how long to get over? This is unusual for him. His s/s started as the [place] came thru and worse after it left. Negative for strep when seen by Dr. Maudie Mercury on 05/11/15 for sore throat, mild cough, and stuffy nose. Has the patient traveled out of the country within the last 30 days? ---No Does the patient have any new or worsening symptoms? ---Yes Will a triage be completed? ---Yes Related visit to physician within the last 2 weeks? ---No Does the PT have any chronic conditions? (i.e. diabetes, asthma, etc.) ---Yes List chronic conditions. ---CAD, HTN Guidelines Guideline Title Affirmed Question Affirmed Notes Nasal Allergies (Hay Fever) [1] Nasal allergies AND [id number] only certain times of year AND [3] hay fever diagnosis has never been confirmed by a HCP Final Disposition User See PCP within [place], RN, [employer] states that he would like to try these suggestions first and then he will call back to schedule appt in a couple of days if no improvement. He was using [employer] with saline, stopped using it, and then s/s returned so he will resume. Disagree/Comply: Comply

## 2015-06-01 NOTE — Telephone Encounter (Signed)
Agree, should follow up if persistent symptoms this long.

## 2015-06-22 ENCOUNTER — Telehealth: Payer: Self-pay | Admitting: Internal Medicine

## 2015-06-22 NOTE — Telephone Encounter (Signed)
Call in viagra 100 mg take 1/2 to one tablet daily as needed for ED.  #10.  RF x 1.  Remind pt that he should start with 50 mg dose and also remember that he can not take nitroglycerin with viagra.  He can continue taking his other BP meds.  Avoid alcohol intake when taking viagra

## 2015-06-22 NOTE — Telephone Encounter (Signed)
Pt saw dr Shawna Orleans in aug 2016 and at that time dr Shawna Orleans offered to write rx for ED med. Pt would like rx now send to [facility] on [address]. Pt is on bp med.

## 2015-06-22 NOTE — Telephone Encounter (Signed)
Medication and directions please

## 2015-06-23 MED ORDER — SILDENAFIL CITRATE 100 MG PO TABS: 50.0000 mg | ORAL_TABLET | Freq: Every day | ORAL | Status: DC | PRN

## 2015-06-23 NOTE — Telephone Encounter (Signed)
Spoke with patient and Rx sent °

## 2015-07-09 ENCOUNTER — Other Ambulatory Visit: Payer: Self-pay | Admitting: Physician Assistant

## 2015-08-17 ENCOUNTER — Other Ambulatory Visit (INDEPENDENT_AMBULATORY_CARE_PROVIDER_SITE_OTHER): Payer: 59

## 2015-08-17 DIAGNOSIS — E039 Hypothyroidism, unspecified: Secondary | ICD-10-CM

## 2015-08-17 DIAGNOSIS — I10 Essential (primary) hypertension: Secondary | ICD-10-CM

## 2015-08-17 LAB — CBC WITH DIFFERENTIAL/PLATELET
Basophils Absolute: 0 10*3/uL (ref 0.0–0.1)
Basophils Relative: 0.3 % (ref 0.0–3.0)
EOS ABS: 0.1 10*3/uL (ref 0.0–0.7)
Eosinophils Relative: 1.7 % (ref 0.0–5.0)
HEMATOCRIT: 51.1 % (ref 39.0–52.0)
Hemoglobin: 17.1 g/dL — ABNORMAL HIGH (ref 13.0–17.0)
LYMPHS PCT: 20.6 % (ref 12.0–46.0)
Lymphs Abs: 1.8 10*3/uL (ref 0.7–4.0)
MCHC: 33.4 g/dL (ref 30.0–36.0)
MCV: 89.1 fl (ref 78.0–100.0)
MONOS PCT: 6.4 % (ref 3.0–12.0)
Monocytes Absolute: 0.5 10*3/uL (ref 0.1–1.0)
Neutro Abs: 6 10*3/uL (ref 1.4–7.7)
Neutrophils Relative %: 71 % (ref 43.0–77.0)
PLATELETS: 251 10*3/uL (ref 150.0–400.0)
RBC: 5.74 Mil/uL (ref 4.22–5.81)
RDW: 13.5 % (ref 11.5–15.5)
WBC: 8.5 10*3/uL (ref 4.0–10.5)

## 2015-08-17 LAB — BASIC METABOLIC PANEL
BUN: 15 mg/dL (ref 6–23)
CALCIUM: 9.1 mg/dL (ref 8.4–10.5)
CO2: 28 mEq/L (ref 19–32)
CREATININE: 0.94 mg/dL (ref 0.40–1.50)
Chloride: 103 mEq/L (ref 96–112)
GFR: 86.44 mL/min (ref >60.00)
Glucose, Bld: 85 mg/dL (ref 70–99)
Potassium: 4.5 mEq/L (ref 3.5–5.1)
SODIUM: 139 meq/L (ref 135–145)

## 2015-08-17 LAB — HEPATIC FUNCTION PANEL
ALK PHOS: 91 U/L (ref 39–117)
ALT: 23 U/L (ref 0–53)
AST: 19 U/L (ref 0–37)
Albumin: 4.2 g/dL (ref 3.5–5.2)
BILIRUBIN DIRECT: 0.2 mg/dL (ref 0.0–0.3)
Total Bilirubin: 0.7 mg/dL (ref 0.2–1.2)
Total Protein: 6.5 g/dL (ref 6.0–8.3)

## 2015-08-17 LAB — LIPID PANEL
CHOL/HDL RATIO: 2
Cholesterol: 106 mg/dL (ref 0–200)
HDL: 46.1 mg/dL (ref >39.00)
LDL CALC: 50 mg/dL (ref 0–99)
NonHDL: 60.3
TRIGLYCERIDES: 52 mg/dL (ref 0.0–149.0)
VLDL: 10.4 mg/dL (ref 0.0–40.0)

## 2015-08-17 LAB — TSH: TSH: 1.45 u[IU]/mL (ref 0.35–4.50)

## 2015-08-23 ENCOUNTER — Ambulatory Visit: Payer: 59 | Admitting: Internal Medicine

## 2015-08-28 ENCOUNTER — Encounter: Payer: Self-pay | Admitting: *Deleted

## 2015-08-29 ENCOUNTER — Ambulatory Visit (INDEPENDENT_AMBULATORY_CARE_PROVIDER_SITE_OTHER): Payer: 59 | Admitting: Internal Medicine

## 2015-08-29 ENCOUNTER — Encounter: Payer: Self-pay | Admitting: *Deleted

## 2015-08-29 ENCOUNTER — Encounter: Payer: Self-pay | Admitting: Internal Medicine

## 2015-08-29 VITALS — BP 140/90 | HR 74 | Temp 98.5°F | Resp 20 | Ht 71.0 in | Wt 192.0 lb

## 2015-08-29 DIAGNOSIS — Z0000 Encounter for general adult medical examination without abnormal findings: Secondary | ICD-10-CM

## 2015-08-29 DIAGNOSIS — I10 Essential (primary) hypertension: Secondary | ICD-10-CM

## 2015-08-29 DIAGNOSIS — I2510 Atherosclerotic heart disease of native coronary artery without angina pectoris: Secondary | ICD-10-CM

## 2015-08-29 DIAGNOSIS — I2583 Coronary atherosclerosis due to lipid rich plaque: Principal | ICD-10-CM

## 2015-08-29 DIAGNOSIS — E782 Mixed hyperlipidemia: Secondary | ICD-10-CM | POA: Diagnosis not present

## 2015-08-29 MED ORDER — NEBIVOLOL HCL 10 MG PO TABS: 10.0000 mg | ORAL_TABLET | Freq: Every day | ORAL | Status: DC

## 2015-08-29 NOTE — Patient Instructions (Signed)
Limit your sodium (Salt) intake    It is important that you exercise regularly, at least 20 minutes 3 to 4 times per week.  If you develop chest pain or shortness of breath seek  medical attention.  Please check your blood pressure on a regular basis.  If it is consistently greater than 140/90, please make an office appointment.  Return in 6 months for follow-up    

## 2015-08-29 NOTE — Progress Notes (Signed)
Subjective:    Patient ID: Terry Hansen, male    DOB: 1955/10/29, 60 y.o.   MRN: [id number]  HPI  60 year old patient who is seen today for follow-up. He has history of coronary artery disease status post CABG.  He has hypertension and dyslipidemia.  Clinically doing quite well.   Preventive health.  Has been over 10 years since his last screening colonoscopy   Laboratory studies reviewed  Past Medical History  Diagnosis Date  . Hypertension   . CAD (coronary artery disease)     a.  LHC 06/26/12:  pLAD 90%, prox/mid/dist CFS 20-30%, oRCA 100% (non dom), EF 55%;  b. Echo 12/13:  EF 55-60%, Gr 1 diast dysfn;  c. s/p CABG [facility]ox[facility] Hockey) 12/13: L-LAD/Dx    . HLD (hyperlipidemia)   . GERD (gastroesophageal reflux disease)   . Ocular migraine     "maybe once q couple months" (06/02/2014)  . Unstable angina pectoris [facility])     Social History   Social History  . Marital Status: Divorced    Spouse Name: N/A  . Number of Children: N/A  . Years of Education: N/A   Occupational History  . Not on file.   Social History Main Topics  . Smoking status: Never Smoker   . Smokeless tobacco: Never Used  . Alcohol Use: 0.6 oz/week    1 Glasses of wine per week  . Drug Use: No  . Sexual Activity: Not Currently   Other Topics Concern  . Not on file   Social History Narrative   Divorced 7 years ago   3 children   Works in Aeronautical engineer    Past Surgical History  Procedure Laterality Date  . Nasal septum surgery      For uncontrolled epistaxis early 2013  . Coronary artery bypass graft  06/29/2012    Procedure: CORONARY ARTERY BYPASS GRAFTING (CABG);  Surgeon: Melrose Nakayama, MD;  Location: [place];  Service: Open Heart Surgery;  Laterality: N/A;  times two using Left Internal Mammary Artery  . Appendectomy  1970's  . Inguinal hernia repair Bilateral [phone]  . Cardiac catheterization  06/2012  . Left heart catheterization with coronary angiogram N/A 06/26/2012   Procedure: LEFT HEART CATHETERIZATION WITH CORONARY ANGIOGRAM;  Surgeon: Josue Hector, MD;  Location: [place] CATH LAB;  Service: Cardiovascular;  Laterality: N/A;  . Left heart catheterization with coronary/graft angiogram N/A 06/03/2014    Procedure: LEFT HEART CATHETERIZATION WITH Beatrix Fetters;  Surgeon: Jettie Booze, MD;  Location: [facility] CATH LAB;  Service: Cardiovascular;  Laterality: N/A;    Family History  Problem Relation Age of Onset  . Heart disease Brother     CABG age 65, redo bypass ~50  . Heart disease Paternal Grandmother   . Heart disease Cousin     Maternal side  . Heart attack Brother   . Colon cancer Maternal Grandfather     No Known Allergies  Current Outpatient Prescriptions on File Prior to Visit  Medication Sig Dispense Refill  . aspirin EC 81 MG tablet Take 1 tablet (81 mg total) by mouth daily.    Marland Kitchen atorvastatin (LIPITOR) 40 MG tablet TAKE 1 TABLET BY MOUTH EVERY DAY AT 6 PM 90 tablet 1  . Coenzyme Q10 200 MG capsule Take 200 mg by mouth every morning. disontinue while in hospital    . ibuprofen (ADVIL,MOTRIN) 200 MG tablet Take 600 mg by mouth every 6 (six) hours as needed. migraine headache    . lisinopril (  PRINIVIL,ZESTRIL) 20 MG tablet Take 1 tablet (20 mg total) by mouth daily. 90 tablet 1  . nitroGLYCERIN (NITROSTAT) 0.4 MG SL tablet Place 1 tablet (0.4 mg total) under the tongue every 5 (five) minutes as needed for chest pain. 25 tablet 3  . sildenafil (VIAGRA) 100 MG tablet Take 0.5-1 tablets (50-100 mg total) by mouth daily as needed for erectile dysfunction. 10 tablet 1  . zolpidem (AMBIEN) 5 MG tablet Take 1 tablet (5 mg total) by mouth at bedtime as needed for sleep. 30 tablet 3   No current facility-administered medications on file prior to visit.    BP 140/90 mmHg  Pulse 74  Temp(Src) 98.5 F (36.9 C) (Oral)  Resp 20  Ht 5\' 11"  (1.803 m)  Wt 192 lb (87.091 kg)  BMI 26.79 kg/m2  SpO2 98%      Review of Systems    Constitutional: Negative for fever, chills, appetite change and fatigue.  HENT: Negative for congestion, dental problem, ear pain, hearing loss, sore throat, tinnitus, trouble swallowing and voice change.   Eyes: Negative for pain, discharge and visual disturbance.  Respiratory: Negative for cough, chest tightness, wheezing and stridor.   Cardiovascular: Negative for chest pain, palpitations and leg swelling.  Gastrointestinal: Negative for nausea, vomiting, abdominal pain, diarrhea, constipation, blood in stool and abdominal distention.  Genitourinary: Negative for urgency, hematuria, flank pain, discharge, difficulty urinating and genital sores.  Musculoskeletal: Negative for myalgias, back pain, joint swelling, arthralgias, gait problem and neck stiffness.  Skin: Negative for rash.  Neurological: Negative for dizziness, syncope, speech difficulty, weakness, numbness and headaches.  Hematological: Negative for adenopathy. Does not bruise/bleed easily.  Psychiatric/Behavioral: Negative for behavioral problems and dysphoric mood. The patient is not nervous/anxious.        Objective:   Physical Exam  Constitutional: He appears well-developed and well-nourished.  HENT:  Head: Normocephalic and atraumatic.  Right Ear: External ear normal.  Left Ear: External ear normal.  Nose: Nose normal.  Mouth/Throat: Oropharynx is clear and moist.  Eyes: Conjunctivae and EOM are normal. Pupils are equal, round, and reactive to light. No scleral icterus.  Neck: Normal range of motion. Neck supple. No JVD present. No thyromegaly present.  Cardiovascular: Regular rhythm, normal heart sounds and intact distal pulses.  Exam reveals no gallop and no friction rub.   No murmur heard. Pulmonary/Chest: Effort normal and breath sounds normal. He exhibits no tenderness.  Status post sternotomy  Abdominal: Soft. Bowel sounds are normal. He exhibits no distension and no mass. There is no tenderness.   Musculoskeletal: Normal range of motion. He exhibits no edema or tenderness.  Lymphadenopathy:    He has no cervical adenopathy.  Neurological: He is alert. He has normal reflexes. No cranial nerve deficit. Coordination normal.  Skin: Skin is warm and dry. No rash noted.  Psychiatric: He has a normal mood and affect. His behavior is normal.          Assessment & Plan:   Coronary artery disease status post CABG Hypertension.  Will continue home blood pressure monitoring.  Will call the office of blood pressures consistently greater than 140 over 90 Dyslipidemia.  LDL cholesterol at goal.  Continue statin therapy  Recheck 6 months

## 2015-08-29 NOTE — Progress Notes (Signed)
Pre visit review using our clinic review tool, if applicable. No additional management support is needed unless otherwise documented below in the visit note. 

## 2015-08-31 ENCOUNTER — Other Ambulatory Visit: Payer: Self-pay | Admitting: Physician Assistant

## 2015-08-31 ENCOUNTER — Other Ambulatory Visit: Payer: Self-pay | Admitting: Internal Medicine

## 2015-10-16 ENCOUNTER — Emergency Department (HOSPITAL_COMMUNITY)
Admission: EM | Admit: 2015-10-16 | Discharge: 2015-10-16 | Disposition: A | Discharge status: Home / Self Care | Payer: 59 | Attending: Emergency Medicine | Admitting: Emergency Medicine

## 2015-10-16 ENCOUNTER — Ambulatory Visit (AMBULATORY_SURGERY_CENTER): Payer: Self-pay | Admitting: *Deleted

## 2015-10-16 ENCOUNTER — Emergency Department (HOSPITAL_COMMUNITY): Payer: 59

## 2015-10-16 ENCOUNTER — Encounter (HOSPITAL_COMMUNITY): Payer: Self-pay | Admitting: *Deleted

## 2015-10-16 ENCOUNTER — Telehealth: Payer: Self-pay | Admitting: *Deleted

## 2015-10-16 ENCOUNTER — Telehealth: Payer: Self-pay | Admitting: Cardiovascular Disease

## 2015-10-16 VITALS — Ht 71.0 in | Wt 191.0 lb

## 2015-10-16 DIAGNOSIS — I25119 Atherosclerotic heart disease of native coronary artery with unspecified angina pectoris: Secondary | ICD-10-CM | POA: Diagnosis not present

## 2015-10-16 DIAGNOSIS — Z1211 Encounter for screening for malignant neoplasm of colon: Secondary | ICD-10-CM

## 2015-10-16 DIAGNOSIS — R002 Palpitations: Secondary | ICD-10-CM | POA: Diagnosis present

## 2015-10-16 DIAGNOSIS — I10 Essential (primary) hypertension: Secondary | ICD-10-CM | POA: Diagnosis not present

## 2015-10-16 DIAGNOSIS — Z79899 Other long term (current) drug therapy: Secondary | ICD-10-CM | POA: Insufficient documentation

## 2015-10-16 DIAGNOSIS — R079 Chest pain, unspecified: Secondary | ICD-10-CM | POA: Insufficient documentation

## 2015-10-16 DIAGNOSIS — Z7982 Long term (current) use of aspirin: Secondary | ICD-10-CM | POA: Insufficient documentation

## 2015-10-16 DIAGNOSIS — E785 Hyperlipidemia, unspecified: Secondary | ICD-10-CM | POA: Insufficient documentation

## 2015-10-16 LAB — CBC
HEMATOCRIT: 48.5 % (ref 39.0–52.0)
HEMOGLOBIN: 16.7 g/dL (ref 13.0–17.0)
MCH: 29.7 pg (ref 26.0–34.0)
MCHC: 34.4 g/dL (ref 30.0–36.0)
MCV: 86.1 fL (ref 78.0–100.0)
Platelets: 213 10*3/uL (ref 150–400)
RBC: 5.63 MIL/uL (ref 4.22–5.81)
RDW: 12.4 % (ref 11.5–15.5)
WBC: 8.6 10*3/uL (ref 4.0–10.5)

## 2015-10-16 LAB — BASIC METABOLIC PANEL
ANION GAP: 7 (ref 5–15)
BUN: 10 mg/dL (ref 6–20)
CO2: 27 mmol/L (ref 22–32)
Calcium: 9.3 mg/dL (ref 8.9–10.3)
Chloride: 105 mmol/L (ref 101–111)
Creatinine, Ser: 1.04 mg/dL (ref 0.61–1.24)
GFR calc Af Amer: >60 mL/min (ref >60)
GLUCOSE: 94 mg/dL (ref 65–99)
POTASSIUM: 4.4 mmol/L (ref 3.5–5.1)
Sodium: 139 mmol/L (ref 135–145)

## 2015-10-16 LAB — MAGNESIUM: MAGNESIUM: 1.8 mg/dL (ref 1.7–2.4)

## 2015-10-16 LAB — I-STAT TROPONIN, ED
TROPONIN I, POC: 0 ng/mL (ref 0.00–0.08)
Troponin i, poc: 0 ng/mL (ref 0.00–0.08)

## 2015-10-16 NOTE — Telephone Encounter (Signed)
Pt called back and said his cardiologist could not get him in until the end of May

## 2015-10-16 NOTE — Discharge Instructions (Signed)

## 2015-10-16 NOTE — Telephone Encounter (Signed)
Pt calling re trouble he had this weekend, heartbeat and pulse has been irregular, SOB, lightheaded, dizziness, light chest pressure--pls call

## 2015-10-16 NOTE — Telephone Encounter (Signed)
He should have cardiology evaluation completed before we do a routine preventive colonoscopy so cancel the colonoscopy appts and advise him to reschedule after cardiology finishes

## 2015-10-16 NOTE — Progress Notes (Signed)
Patient denies any allergies to eggs or soy. Patient denies any problems with anesthesia/sedation. Patient denies any oxygen use at home and does not take any diet/weight loss medications. Patient declined EMMI education. See phone note sent to Dr.Gessner.

## 2015-10-16 NOTE — Telephone Encounter (Signed)
Spoke with patient. Notified him of Dr's recommendations. Colonoscopy cancelled at this time. Pt aware. Encouraged pt to call back his heart dr and let them know what happen this Sat. He said he would tell them.

## 2015-10-16 NOTE — Telephone Encounter (Signed)
Called patient back. Patient complained of SOB, lightheaded, dizziness, light chest pressure, racing heart, and skipped beats that have been going on since Saturday. Patient's last office visit was 09/30/14. Will consult DOD.  Dr. Meda Coffee (DOD) advised patient to go to ED for evaluation.   Called patient back and informed him of Dr. Francesca Oman recommendation. Patient agreed to go to ED.

## 2015-10-16 NOTE — ED Notes (Signed)
MD at bedside. 

## 2015-10-16 NOTE — ED Notes (Signed)
Pt reports having cardiac hx. Having palpitations, chest pressure and some sob since Saturday. No resp distress noted at triage, ekg done.

## 2015-10-16 NOTE — Telephone Encounter (Signed)
Patient is scheduled for direct screening colonoscopy on 10/30/15. His last colon was 10-15 years ago per pt. Patient has had heart bypass 2013, no heart problems since then per pt. Patient states Saturday while cleaning out his Aunt's house he started having irregular heart beats, he was checking his on pulse and felt this. He started drinking lots of water and rested and it did stop. He felt bad thru Sunday morning. He feels fine today. Patient had last heart OV w/Dr.Nishan on 09/30/14. I did encourage patient to call heart dr and let them know this happened and try to make OV before colonoscopy. He said he will call today. Should patient's colon be held until heart clearance or OK for direct colon at this time? Please advise. Thank you,Liannah Yarbough PV

## 2015-10-16 NOTE — ED Provider Notes (Signed)
CSN: [id number]     Arrival date & time 10/16/15  1219 History   First MD Initiated Contact with Patient 10/16/15 1341     Chief Complaint  Patient presents with  . Chest Pain  . Palpitations   HPI Pt noticed over the weekend he was having some episodes of his heart beating irregularly.  It has been fast at times but also irregular.  Over the last few weeks he has felt more fatigued than usual.  This weekend it was worse.  He has had some intermittent chest pressure but it is mild and he thinks it has been more related to his emotional state.  No radiation of the discomfort.  No nausea or vomiting.  No diaphoresis.  He called Dr Johnsie Cancel and was told to come to the ED. Past Medical History  Diagnosis Date  . Hypertension   . CAD (coronary artery disease)     a.  LHC 06/26/12:  pLAD 90%, prox/mid/dist CFS 20-30%, oRCA 100% (non dom), EF 55%;  b. Echo 12/13:  EF 55-60%, Gr 1 diast dysfn;  c. s/p CABG [facility]) 12/13: L-LAD/Dx    . HLD (hyperlipidemia)   . GERD (gastroesophageal reflux disease)   . Ocular migraine     "maybe once q couple months" (06/02/2014)  . Unstable angina pectoris [facility])    Past Surgical History  Procedure Laterality Date  . Nasal septum surgery      For uncontrolled epistaxis early 2013  . Coronary artery bypass graft  06/29/2012    Procedure: CORONARY ARTERY BYPASS GRAFTING (CABG);  Surgeon: Melrose Nakayama, MD;  Location: [facility];  Service: Open Heart Surgery;  Laterality: N/A;  times two using Left Internal Mammary Artery  . Appendectomy  1970's  . Inguinal hernia repair Bilateral [phone]  . Cardiac catheterization  06/2012  . Left heart catheterization with coronary angiogram N/A 06/26/2012    Procedure: LEFT HEART CATHETERIZATION WITH CORONARY ANGIOGRAM;  Surgeon: Josue Hector, MD;  Location: [facility] CATH LAB;  Service: Cardiovascular;  Laterality: N/A;  . Left heart catheterization with coronary/graft angiogram N/A 06/03/2014    Procedure: LEFT HEART  CATHETERIZATION WITH Beatrix Fetters;  Surgeon: Jettie Booze, MD;  Location: [facility] CATH LAB;  Service: Cardiovascular;  Laterality: N/A;  . Colonoscopy  10-15 yrs ago    in [place]   Family History  Problem Relation Age of Onset  . Heart disease Brother     CABG age 16, redo bypass ~50  . Heart disease Paternal Grandmother   . Heart disease Cousin     Maternal side  . Heart attack Brother   . Colon cancer Maternal Grandfather 3   Social History  Substance Use Topics  . Smoking status: Never Smoker   . Smokeless tobacco: Never Used  . Alcohol Use: 0.6 oz/week    1 Glasses of wine per week    Review of Systems    Allergies  Review of patient's allergies indicates no known allergies.  Home Medications   Prior to Admission medications   Medication Sig Start Date End Date Taking? Authorizing Provider  aspirin EC 81 MG tablet Take 1 tablet (81 mg total) by mouth daily. 07/21/12  Yes Liliane Shi, PA-C  atorvastatin (LIPITOR) 40 MG tablet TAKE 1 TABLET BY MOUTH EVERY DAY AT 6 PM 08/31/15  Yes Doe-Hyun R Shawna Orleans, DO  Coenzyme Q10 200 MG capsule Take 200 mg by mouth every morning. disontinue while in hospital   Yes Historical Provider, MD  ibuprofen (ADVIL,MOTRIN) 200 MG tablet Take 600 mg by mouth every 6 (six) hours as needed. migraine headache   Yes Historical Provider, MD  lisinopril (PRINIVIL,ZESTRIL) 20 MG tablet Take 1 tablet (20 mg total) by mouth daily. 02/20/15  Yes Doe-Hyun Kyra Searles, DO  Multiple Vitamin (MULTIVITAMIN) tablet Take 1 tablet by mouth daily.   Yes Historical Provider, MD  nebivolol (BYSTOLIC) 10 MG tablet Take 1 tablet (10 mg total) by mouth daily. TAKE 1 TABLET BY MOUTH DAILY 08/29/15  Yes Marletta Lor, MD  nitroGLYCERIN (NITROSTAT) 0.4 MG SL tablet Place 1 tablet (0.4 mg total) under the tongue every 5 (five) minutes as needed for chest pain. 11/29/13  Yes Josue Hector, MD  sildenafil (VIAGRA) 100 MG tablet Take 0.5-1 tablets (50-100 mg  total) by mouth daily as needed for erectile dysfunction. 06/23/15  Yes Doe-Hyun R Shawna Orleans, DO  zolpidem (AMBIEN) 5 MG tablet Take 1 tablet (5 mg total) by mouth at bedtime as needed for sleep. 10/24/14  Yes Doe-Hyun R Shawna Orleans, DO   BP 139/80 mmHg  Pulse 52  Temp(Src) 97.7 F (36.5 C) (Oral)  Resp 12  SpO2 95% Physical Exam  Constitutional: He appears well-developed and well-nourished. No distress.  HENT:  Head: Normocephalic and atraumatic.  Right Ear: External ear normal.  Left Ear: External ear normal.  Eyes: Conjunctivae are normal. Right eye exhibits no discharge. Left eye exhibits no discharge. No scleral icterus.  Neck: Neck supple. No tracheal deviation present.  Cardiovascular: Normal rate, regular rhythm and intact distal pulses.   Pulmonary/Chest: Effort normal and breath sounds normal. No stridor. No respiratory distress. He has no wheezes. He has no rales.  Abdominal: Soft. Bowel sounds are normal. He exhibits no distension. There is no tenderness. There is no rebound and no guarding.  Musculoskeletal: He exhibits no edema or tenderness.  Neurological: He is alert. He has normal strength. No cranial nerve deficit (no facial droop, extraocular movements intact, no slurred speech) or sensory deficit. He exhibits normal muscle tone. He displays no seizure activity. Coordination normal.  Skin: Skin is warm and dry. No rash noted.  Psychiatric: He has a normal mood and affect.  Nursing note and vitals reviewed.   ED Course  Procedures (including critical care time) Labs Review Labs Reviewed  BASIC METABOLIC PANEL  CBC  MAGNESIUM  I-STAT [place], ED  [facility], ED    Imaging Review Dg Chest 2 View  10/16/2015  CLINICAL DATA:  Intermittent chest pain and weakness for 2 weeks. Worsening symptoms over the last 2 days with tachycardia and tightness. History of CABG. EXAM: CHEST  2 VIEW COMPARISON:  09/29/2014 and 06/01/2014. FINDINGS: The heart size and mediastinal contours  are stable status post median sternotomy and CABG. The lungs are clear. There is no pleural effusion. No acute osseous findings are seen. IMPRESSION: Stable postoperative chest.  No active cardiopulmonary process. Electronically Signed   By: Richardean Sale M.D.   On: 10/16/2015 13:58   I have personally reviewed and evaluated these images and lab results as part of my medical decision-making.   EKG Interpretation   Date/Time:  Monday October 16 2015 12:36:57 EDT Ventricular Rate:  59 PR Interval:  122 QRS Duration: 108 QT Interval:  394 QTC Calculation: 390 R Axis:   37 Text Interpretation:  Sinus bradycardia with Premature atrial complexes  Incomplete right bundle branch block Borderline ECG agree.  no sig change  Confirmed by Johnney Killian, MD, Jeannie Done [phone]) on 10/16/2015 12:42:10 PM  MDM   Final diagnoses:  Palpitations    Discussed with Dr Marlou Porch.  Reviewed the tele strip in the ED with the narrow complex tachycardia.  Doubt cardiac ischemia.  Pt is feeling well at this point,.  Will continue current medical regimen.   Follow up in the cardiology office.    Dorie Rank, MD 10/16/15 [phone]

## 2015-10-19 ENCOUNTER — Emergency Department (HOSPITAL_COMMUNITY): Payer: 59

## 2015-10-19 ENCOUNTER — Encounter (HOSPITAL_COMMUNITY): Payer: Self-pay

## 2015-10-19 ENCOUNTER — Observation Stay (HOSPITAL_COMMUNITY)
Admission: EM | Admit: 2015-10-19 | Discharge: 2015-10-23 | Disposition: A | Discharge status: Home / Self Care | Payer: 59 | Attending: Cardiovascular Disease | Admitting: Cardiovascular Disease

## 2015-10-19 DIAGNOSIS — Z951 Presence of aortocoronary bypass graft: Secondary | ICD-10-CM | POA: Insufficient documentation

## 2015-10-19 DIAGNOSIS — R0789 Other chest pain: Secondary | ICD-10-CM | POA: Insufficient documentation

## 2015-10-19 DIAGNOSIS — E782 Mixed hyperlipidemia: Secondary | ICD-10-CM | POA: Diagnosis not present

## 2015-10-19 DIAGNOSIS — R0602 Shortness of breath: Secondary | ICD-10-CM | POA: Diagnosis not present

## 2015-10-19 DIAGNOSIS — Z8249 Family history of ischemic heart disease and other diseases of the circulatory system: Secondary | ICD-10-CM | POA: Insufficient documentation

## 2015-10-19 DIAGNOSIS — I959 Hypotension, unspecified: Secondary | ICD-10-CM | POA: Insufficient documentation

## 2015-10-19 DIAGNOSIS — I25110 Atherosclerotic heart disease of native coronary artery with unstable angina pectoris: Secondary | ICD-10-CM | POA: Insufficient documentation

## 2015-10-19 DIAGNOSIS — R5383 Other fatigue: Secondary | ICD-10-CM | POA: Diagnosis not present

## 2015-10-19 DIAGNOSIS — I2510 Atherosclerotic heart disease of native coronary artery without angina pectoris: Secondary | ICD-10-CM | POA: Diagnosis not present

## 2015-10-19 DIAGNOSIS — Z7982 Long term (current) use of aspirin: Secondary | ICD-10-CM | POA: Insufficient documentation

## 2015-10-19 DIAGNOSIS — Z79899 Other long term (current) drug therapy: Secondary | ICD-10-CM | POA: Diagnosis not present

## 2015-10-19 DIAGNOSIS — I495 Sick sinus syndrome: Secondary | ICD-10-CM | POA: Diagnosis not present

## 2015-10-19 DIAGNOSIS — E785 Hyperlipidemia, unspecified: Secondary | ICD-10-CM | POA: Insufficient documentation

## 2015-10-19 DIAGNOSIS — I498 Other specified cardiac arrhythmias: Principal | ICD-10-CM | POA: Diagnosis present

## 2015-10-19 DIAGNOSIS — I4510 Unspecified right bundle-branch block: Secondary | ICD-10-CM | POA: Diagnosis not present

## 2015-10-19 DIAGNOSIS — R002 Palpitations: Secondary | ICD-10-CM | POA: Diagnosis not present

## 2015-10-19 DIAGNOSIS — I10 Essential (primary) hypertension: Secondary | ICD-10-CM

## 2015-10-19 DIAGNOSIS — R531 Weakness: Secondary | ICD-10-CM | POA: Diagnosis not present

## 2015-10-19 DIAGNOSIS — I4891 Unspecified atrial fibrillation: Secondary | ICD-10-CM | POA: Insufficient documentation

## 2015-10-19 DIAGNOSIS — R9431 Abnormal electrocardiogram [ECG] [EKG]: Secondary | ICD-10-CM | POA: Diagnosis not present

## 2015-10-19 DIAGNOSIS — I083 Combined rheumatic disorders of mitral, aortic and tricuspid valves: Secondary | ICD-10-CM | POA: Diagnosis not present

## 2015-10-19 DIAGNOSIS — I4581 Long QT syndrome: Secondary | ICD-10-CM | POA: Insufficient documentation

## 2015-10-19 DIAGNOSIS — I951 Orthostatic hypotension: Secondary | ICD-10-CM | POA: Diagnosis not present

## 2015-10-19 DIAGNOSIS — R001 Bradycardia, unspecified: Secondary | ICD-10-CM | POA: Insufficient documentation

## 2015-10-19 DIAGNOSIS — N289 Disorder of kidney and ureter, unspecified: Secondary | ICD-10-CM | POA: Insufficient documentation

## 2015-10-19 DIAGNOSIS — K219 Gastro-esophageal reflux disease without esophagitis: Secondary | ICD-10-CM | POA: Diagnosis not present

## 2015-10-19 DIAGNOSIS — Z800 Family history of malignant neoplasm of digestive organs: Secondary | ICD-10-CM | POA: Diagnosis not present

## 2015-10-19 DIAGNOSIS — I471 Supraventricular tachycardia: Secondary | ICD-10-CM

## 2015-10-19 DIAGNOSIS — I4719 Other supraventricular tachycardia: Secondary | ICD-10-CM

## 2015-10-19 DIAGNOSIS — F419 Anxiety disorder, unspecified: Secondary | ICD-10-CM | POA: Diagnosis not present

## 2015-10-19 DIAGNOSIS — I491 Atrial premature depolarization: Secondary | ICD-10-CM

## 2015-10-19 LAB — COMPREHENSIVE METABOLIC PANEL
ALT: 31 U/L (ref 17–63)
AST: 22 U/L (ref 15–41)
Albumin: 3.9 g/dL (ref 3.5–5.0)
Alkaline Phosphatase: 106 U/L (ref 38–126)
Anion gap: 8 (ref 5–15)
BILIRUBIN TOTAL: 0.9 mg/dL (ref 0.3–1.2)
CO2: 26 mmol/L (ref 22–32)
CREATININE: 0.93 mg/dL (ref 0.61–1.24)
Calcium: 9.3 mg/dL (ref 8.9–10.3)
Chloride: 104 mmol/L (ref 101–111)
GFR calc Af Amer: >60 mL/min (ref >60)
Glucose, Bld: 110 mg/dL — ABNORMAL HIGH (ref 65–99)
Potassium: 4.4 mmol/L (ref 3.5–5.1)
Sodium: 138 mmol/L (ref 135–145)
TOTAL PROTEIN: 6.8 g/dL (ref 6.5–8.1)

## 2015-10-19 LAB — PROTIME-INR
INR: 1.05 (ref 0.00–1.49)
PROTHROMBIN TIME: 13.9 s (ref 11.6–15.2)

## 2015-10-19 LAB — CBC
HCT: 47.3 % (ref 39.0–52.0)
HEMATOCRIT: 48.7 % (ref 39.0–52.0)
HEMOGLOBIN: 16.4 g/dL (ref 13.0–17.0)
Hemoglobin: 16.8 g/dL (ref 13.0–17.0)
MCH: 29.6 pg (ref 26.0–34.0)
MCH: 29.5 pg (ref 26.0–34.0)
MCHC: 34.5 g/dL (ref 30.0–36.0)
MCHC: 34.7 g/dL (ref 30.0–36.0)
MCV: 85.9 fL (ref 78.0–100.0)
MCV: 85.1 fL (ref 78.0–100.0)
Platelets: 219 10*3/uL (ref 150–400)
Platelets: 203 10*3/uL (ref 150–400)
RBC: 5.67 MIL/uL (ref 4.22–5.81)
RBC: 5.56 MIL/uL (ref 4.22–5.81)
RDW: 12.4 % (ref 11.5–15.5)
RDW: 12.3 % (ref 11.5–15.5)
WBC: 7 10*3/uL (ref 4.0–10.5)
WBC: 8.9 10*3/uL (ref 4.0–10.5)

## 2015-10-19 LAB — TROPONIN I: Troponin I: <0.03 ng/mL (ref <0.031)

## 2015-10-19 LAB — I-STAT TROPONIN, ED: Troponin i, poc: 0.01 ng/mL (ref 0.00–0.08)

## 2015-10-19 LAB — MRSA PCR SCREENING: MRSA BY PCR: NEGATIVE

## 2015-10-19 LAB — MAGNESIUM: MAGNESIUM: 1.9 mg/dL (ref 1.7–2.4)

## 2015-10-19 LAB — APTT: aPTT: 34 seconds (ref 24–37)

## 2015-10-19 LAB — TSH: TSH: 1.1 u[IU]/mL (ref 0.350–4.500)

## 2015-10-19 MED ORDER — VERAPAMIL HCL 80 MG PO TABS
80.0000 mg | ORAL_TABLET | Freq: Three times a day (TID) | ORAL | Status: DC
  Administered 2015-10-19 – 2015-10-20 (×3): 80 mg via ORAL
  Filled 2015-10-19 (×8): qty 1

## 2015-10-19 MED ORDER — HEPARIN SODIUM (PORCINE) 5000 UNIT/ML IJ SOLN
5000.0000 [IU] | Freq: Three times a day (TID) | INTRAMUSCULAR | Status: DC
  Administered 2015-10-19 – 2015-10-23 (×10): 5000 [IU] via SUBCUTANEOUS
  Filled 2015-10-19 (×11): qty 1

## 2015-10-19 MED ORDER — TRAZODONE HCL 50 MG PO TABS
50.0000 mg | ORAL_TABLET | Freq: Every evening | ORAL | Status: DC | PRN
  Administered 2015-10-20 – 2015-10-21 (×3): 50 mg via ORAL
  Filled 2015-10-19 (×3): qty 1

## 2015-10-19 MED ORDER — ASPIRIN EC 81 MG PO TBEC
81.0000 mg | DELAYED_RELEASE_TABLET | Freq: Every day | ORAL | Status: DC
  Administered 2015-10-20 – 2015-10-23 (×4): 81 mg via ORAL
  Filled 2015-10-19 (×4): qty 1

## 2015-10-19 MED ORDER — SODIUM CHLORIDE 0.9% FLUSH
3.0000 mL | Freq: Two times a day (BID) | INTRAVENOUS | Status: DC
  Administered 2015-10-19 – 2015-10-23 (×7): 3 mL via INTRAVENOUS

## 2015-10-19 MED ORDER — NEBIVOLOL HCL 10 MG PO TABS
10.0000 mg | ORAL_TABLET | Freq: Every day | ORAL | Status: DC
  Administered 2015-10-20: 10 mg via ORAL
  Filled 2015-10-19 (×2): qty 1

## 2015-10-19 MED ORDER — ATORVASTATIN CALCIUM 40 MG PO TABS
40.0000 mg | ORAL_TABLET | Freq: Every day | ORAL | Status: DC
  Administered 2015-10-19 – 2015-10-22 (×4): 40 mg via ORAL
  Filled 2015-10-19 (×4): qty 1

## 2015-10-19 NOTE — ED Notes (Signed)
Pt presents with continued palpitations and chest tightness since Monday.  Pt was seen here for same, discharged with PVCs.  Pt reports symptoms are worse, reports mid-sternal chest tightness and shortness of breath.  -nausea, pt reports extreme weakness.

## 2015-10-19 NOTE — Progress Notes (Signed)
Pt SBP now in the low 90s after being in the 140s before shift change.  MD on call notified about low pressures and irregular HR that is not sustained, advised to hold verapamil, also EKG if sustained high HR and call back.  Will continue to monitor.

## 2015-10-19 NOTE — ED Provider Notes (Signed)
Patient presents to emergency room with recurrent palpitations. Actually saw the patient myself 3 days ago in the emergency room for similar symptoms. He was noted to have episodes of a narrow complex tachycardia. Plan was for him to follow up with his cardiologist. Patient had another episode at work today and said to come back to the emergency room to get reevaluated because he was feeling weak and fatigued Physical Exam  BP 157/105 mmHg  Pulse 85  Temp(Src) 97.4 F (36.3 C) (Oral)  Resp 22  Wt 83.915 kg  SpO2 100%  Physical Exam  Constitutional: He appears well-developed and well-nourished. No distress.  HENT:  Head: Normocephalic and atraumatic.  Right Ear: External ear normal.  Left Ear: External ear normal.  Eyes: Conjunctivae are normal. Right eye exhibits no discharge. Left eye exhibits no discharge. No scleral icterus.  Neck: Neck supple. No tracheal deviation present.  Cardiovascular: Normal rate.   Pulmonary/Chest: Effort normal. No stridor. No respiratory distress.  Musculoskeletal: He exhibits no edema.  Neurological: He is alert. Cranial nerve deficit: no gross deficits.  Skin: Skin is warm and dry. No rash noted.  Psychiatric: He has a normal mood and affect.  Nursing note and vitals reviewed.   ED Course  Procedures  EKG Interpretation  Date/Time:  Thursday October 19 2015 11:36:40 EDT Ventricular Rate:  109 PR Interval:  168 QRS Duration: 94 QT Interval:  324 QTC Calculation: 436 R Axis:   57 Text Interpretation:  sinus rhtyhm, ?intermittent a fib Nonspecific ST abnormality Abnormal ECG tachycardic rythm is new since last tracing Confirmed by Shanita Kanan  MD-J, Gerhardt Gleed ([id number]) on 10/19/2015 12:03:32 PM       MDM Patient's EKG showed a few beats of a narrow complex tachycardia.  Unclear whether he is having episodes of SVT versus A. fib. Patient denies any chest pain at this point however he does have significant cardiac disease. Plan On checking cardiac enzymes and  asking cardiology to evaluate him in the ED considering his recurrent visits over the last few days      Dorie Rank, MD 10/19/15 1255

## 2015-10-19 NOTE — H&P (Signed)
Cardiologist: Terry Hansen is an 60 y.o. male.   Chief Complaint: Palpitations  HPI:  Terry Hansen is a 61 year old male who has history of coronary artery disease status post CABG in 2013 with a LIMA to the LAD and diagonal. He presented to the emergency room with chest pain and cardiac enzymes were negative. He underwent cardiac catheterization on 06/03/14. Previously placed bypass grafts were patent and there was no other critical disease. Medical therapy was recommended for possible small vessel disease. There was also concern for possible GI etiology of his pain and was started on Protonix. Patient also has a history of hypertension and hyperlipidemia. Seen in ER 09/29/14 for atypical pain r/o no ECG changes and thought pain non cardiac and symptoms related to anxiety He has been on ambien and lexapro for this per primary MD.  12/15 beta blocker stopped due to libido and fatigue issues and lisinopril increased,however he restarted bystolic when BP elevated.  Last 2-D echocardiogram was December 2013 revealed an ejection fraction of 09-81% grade 1 diastolic dysfunction.  Patient was seen in the emergency room 3 days ago for palpitations and chest pain. He reported his heart was beating irregularly and fast at times.  EKG was showed sinus rhythm.  EKG today shows atrial fibrillation/SVT with rapid ventricular response.  The patient reports having having palpitations starting this Saturday. He feels wiped out, fatigued. He's had some chest pressure, approximately 2 out of 10 in intensity. Said very little shortness of breath more than usual. He's not been sleeping well since that time.  The patient currently denies nausea, vomiting, fever, chills, orthopnea, dizziness, PND, cough, congestion, abdominal pain, hematochezia, melena, lower extremity edema, claudication.    Medications Medication Sig  aspirin EC 81 MG tablet Take 1 tablet (81 mg total) by mouth daily.  atorvastatin (LIPITOR)  40 MG tablet TAKE 1 TABLET BY MOUTH EVERY DAY AT 6 PM  Coenzyme Q10 200 MG capsule Take 200 mg by mouth every morning. disontinue while in hospital  ibuprofen (ADVIL,MOTRIN) 200 MG tablet Take 600 mg by mouth every 6 (six) hours as needed. migraine headache  lisinopril (PRINIVIL,ZESTRIL) 20 MG tablet Take 1 tablet (20 mg total) by mouth daily.  Multiple Vitamin (MULTIVITAMIN) tablet Take 1 tablet by mouth daily.  nebivolol (BYSTOLIC) 10 MG tablet Take 1 tablet (10 mg total) by mouth daily. TAKE 1 TABLET BY MOUTH DAILY  zolpidem (AMBIEN) 5 MG tablet Take 1 tablet (5 mg total) by mouth at bedtime as needed for sleep.  nitroGLYCERIN (NITROSTAT) 0.4 MG SL tablet Place 1 tablet (0.4 mg total) under the tongue every 5 (five) minutes as needed for chest pain.  sildenafil (VIAGRA) 100 MG tablet Take 0.5-1 tablets (50-100 mg total) by mouth daily as needed for erectile dysfunction.     Past Medical History  Diagnosis Date  . Hypertension   . CAD (coronary artery disease)     a.  LHC 06/26/12:  pLAD 90%, prox/mid/dist CFS 20-30%, oRCA 100% (non dom), EF 55%;  b. Echo 12/13:  EF 55-60%, Gr 1 diast dysfn;  c. s/p CABG [facility]) 12/13: L-LAD/Dx    . HLD (hyperlipidemia)   . GERD (gastroesophageal reflux disease)   . Ocular migraine     "maybe once q couple months" (06/02/2014)  . Unstable angina pectoris [facility])     Past Surgical History  Procedure Laterality Date  . Nasal septum surgery      For uncontrolled epistaxis early 2013  . Coronary artery  bypass graft  06/29/2012    Procedure: CORONARY ARTERY BYPASS GRAFTING (CABG);  Surgeon: Melrose Nakayama, MD;  Location: [place];  Service: Open Heart Surgery;  Laterality: N/A;  times two using Left Internal Mammary Artery  . Appendectomy  1970's  . Inguinal hernia repair Bilateral [phone]  . Cardiac catheterization  06/2012  . Left heart catheterization with coronary angiogram N/A 06/26/2012    Procedure: LEFT HEART CATHETERIZATION WITH CORONARY  ANGIOGRAM;  Surgeon: Josue Hector, MD;  Location: [facility] CATH LAB;  Service: Cardiovascular;  Laterality: N/A;  . Left heart catheterization with coronary/graft angiogram N/A 06/03/2014    Procedure: LEFT HEART CATHETERIZATION WITH Beatrix Fetters;  Surgeon: Jettie Booze, MD;  Location: [facility] CATH LAB;  Service: Cardiovascular;  Laterality: N/A;  . Colonoscopy  10-15 yrs ago    in [place]    Family History  Problem Relation Age of Onset  . Heart disease Brother     CABG age 43, redo bypass ~50  . Heart disease Paternal Grandmother   . Heart disease Cousin     Maternal side  . Heart attack Brother   . Colon cancer Maternal Grandfather 48   Social History:  reports that he has never smoked. He has never used smokeless tobacco. He reports that he drinks about 0.6 oz of alcohol per week. He reports that he does not use illicit drugs.  Allergies: No Known Allergies   (Not in a hospital admission)  Results for orders placed or performed during the hospital encounter of 10/19/15 (from the past 48 hour(s))  CBC     Status: None   Collection Time: 10/19/15 11:44 AM  Result Value Ref Range   WBC 7.0 4.0 - 10.5 K/uL   RBC 5.67 4.22 - 5.81 MIL/uL   Hemoglobin 16.8 13.0 - 17.0 g/dL   HCT 48.7 39.0 - 52.0 %   MCV 85.9 78.0 - 100.0 fL   MCH 29.6 26.0 - 34.0 pg   MCHC 34.5 30.0 - 36.0 g/dL   RDW 12.4 11.5 - 15.5 %   Platelets 219 150 - 400 K/uL  Comprehensive metabolic panel     Status: Abnormal   Collection Time: 10/19/15 11:44 AM  Result Value Ref Range   Sodium 138 135 - 145 mmol/L   Potassium 4.4 3.5 - 5.1 mmol/L   Chloride 104 101 - 111 mmol/L   CO2 26 22 - 32 mmol/L   Glucose, Bld 110 (H) 65 - 99 mg/dL   BUN <5 (L) 6 - 20 mg/dL   Creatinine, Ser 0.93 0.61 - 1.24 mg/dL   Calcium 9.3 8.9 - 10.3 mg/dL   Total Protein 6.8 6.5 - 8.1 g/dL   Albumin 3.9 3.5 - 5.0 g/dL   AST 22 15 - 41 U/L   ALT 31 17 - 63 U/L   Alkaline Phosphatase 106 38 - 126 U/L   Total  Bilirubin 0.9 0.3 - 1.2 mg/dL   GFR calc non Af Amer >60 >60 mL/min   GFR calc Af Amer >60 >60 mL/min    Comment: (NOTE) The eGFR has been calculated using the CKD EPI equation. This calculation has not been validated in all clinical situations. eGFR's persistently <60 mL/min signify possible Chronic Kidney Disease.    Anion gap 8 5 - 15  Protime-INR     Status: None   Collection Time: 10/19/15 11:44 AM  Result Value Ref Range   Prothrombin Time 13.9 11.6 - 15.2 seconds   INR 1.05 0.00 -  1.49  APTT     Status: None   Collection Time: 10/19/15 11:44 AM  Result Value Ref Range   aPTT 34 24 - 37 seconds  I-stat troponin, ED (not at [facility])     Status: None   Collection Time: 10/19/15 12:28 PM  Result Value Ref Range   Troponin i, poc 0.01 0.00 - 0.08 ng/mL   Comment 3            Comment: Due to the release kinetics of cTnI, a negative result within the first hours of the onset of symptoms does not rule out myocardial infarction with certainty. If myocardial infarction is still suspected, repeat the test at appropriate intervals.    No results found.  Review of Systems  Constitutional: Positive for malaise/fatigue. Negative for fever and diaphoresis.  HENT: Negative for congestion and sore throat.   Respiratory: Positive for shortness of breath (Very little). Negative for wheezing.   Cardiovascular: Positive for chest pain and palpitations. Negative for orthopnea, leg swelling and PND.  Gastrointestinal: Negative for nausea, vomiting, abdominal pain, blood in stool and melena.  Musculoskeletal: Negative for myalgias and neck pain.  Neurological: Negative for dizziness.  All other systems reviewed and are negative.   Blood pressure 134/58, pulse 65, temperature 97.9 F (36.6 C), temperature source Oral, resp. rate 17, weight 185 lb (83.915 kg), SpO2 98 %. Physical Exam  Nursing note and vitals reviewed. Constitutional: He is oriented to person, place, and time. He  appears well-developed and well-nourished. No distress.  HENT:  Head: Normocephalic and atraumatic.  Mouth/Throat: No oropharyngeal exudate.  Eyes: EOM are normal. Pupils are equal, round, and reactive to light. No scleral icterus.  Neck: Normal range of motion. Neck supple. No JVD present.  Cardiovascular: An irregularly irregular rhythm present. Tachycardia present.   No murmur heard. Pulses:      Radial pulses are 2+ on the right side, and 2+ on the left side.       Dorsalis pedis pulses are 2+ on the right side, and 2+ on the left side.  Respiratory: Effort normal and breath sounds normal. He has no wheezes. He has no rales.  GI: Soft. Bowel sounds are normal. He exhibits no distension. There is no tenderness.  Musculoskeletal: He exhibits no edema.  Lymphadenopathy:    He has no cervical adenopathy.  Neurological: He is alert and oriented to person, place, and time. He exhibits normal muscle tone.  Skin: Skin is warm and dry.  Psychiatric: He has a normal mood and affect.     Assessment/Plan Principal Problem:   Chronotropic incompetence with sinus node dysfunction (HCC) Active Problems:   Mixed hyperlipidemia   CAD (coronary artery disease)   HTN (hypertension)   Palpitations  60 year old male who has history of coronary artery disease status post CABG in 2013 with a LIMA to the LAD and diagonal.  He presents with palpitations which started Saturday. He feels wiped out, fatigued. He's had some chest pressure-2 out of 10.  Telemetry and EKG reveal sinus node dysfunction.  He is rapidly alternating between a normal rate and tachycardia.  No TWI inversions.  Troponin POC negative x3.  We will admit to stepdown.  Cycle troponin.  May need left heart cath to assess ischemic etiology for sinus node dysfunction.  Will leave bystolic and add verapamil 80 TID for better rate control.  Monitor BP and HR.  Consider EP consult if no cause found.  He is currently stable.  BP mildly  elevated.   Hold lisinopril while we adjust BB.  Thyroid etiology?  Check TSH.  Recheck mag.   Was 1.8 on 3/27  Terry Fuller, PA-C 10/19/2015, 2:29 PM   Patient seen and examined. Agree with assessment and plan.  They're Terry Hansen is a 60 year old Caucasian male who underwent CABG surgery in 2013 and had a LIMA placed was LAD and diagonal vessels.  At that time, he had a totally occluded nondominant RCA and had mild nonobstructive plaque in his left circumflex vessel.  He has a history of hypertension and hyperlipidemia.  An echo Doppler study in 2013 showed an EF of 55-60% with grade 1 diastolic dysfunction.  The patient has continued to be active and continues to work, cuts his grass and plays golf.  Several days ago he began to experience palpitations and vague chest sensation.  He has felt fatigued.  He underwent initial evaluation in the emergency room.  His ECG showed sinus bradycardia 59 bpm with PACs, incomplete right bundle branch block.  He was sent home.  Today he again has experienced recurrent episodes of palpitations. Telemetry has shown sinus rhythm with frequent PACs followed by pauses as well as bursts of SVT , heart rates in the low 120s..  At times there does appear to be mild PR prolongation, but definitive Mobitz 1 block was not demonstrated.  I suspect there may be a component of sinus node dysfunction contributing to his episodes.  He is not having any anginal symptomatology.  Electrolytes are normal with a K of 4.4 and MG 1.8.  He is not anemic with a hemoglobin of 16.8 and hematocrit 48.7.  His chest x-ray was unremarkable without active cardiopulmonary disease.  At present, will admit the patient for further observation and management.  He is hypertensive in the emergency room and admits to some anxiety.  He has been on Bystolic 10 mg in addition to lisinopril 20 mg.  Will attempt to add low-dose verapamil to his medical regimen, initially at 80 mg twice a day rather than 3 times a day to see  if this can play a role in reducing his short bursts of SVT.  Will need to follow his rhythm closely to make certain he does not develop significant bradyarrythmia or AV block. .  I will schedule the patient for follow-up echo Doppler study and will check thyroid function studies.  Further recommendations will be forthcoming upon additional data collection.   Terry Sine, MD, [facility] 10/19/2015 3:17 PM

## 2015-10-19 NOTE — ED Provider Notes (Signed)
CSN: [id number]     Arrival date & time 10/19/15  1130 History   First MD Initiated Contact with Patient 10/19/15 1206     Chief Complaint  Patient presents with  . Palpitations     (Consider location/radiation/quality/duration/timing/severity/associated sxs/prior Treatment) Patient is a 60 y.o. male presenting with palpitations. The history is provided by the patient.  Palpitations Palpitations quality:  Irregular Onset quality:  Sudden Duration:  1 week Timing:  Intermittent Progression:  Waxing and waning Chronicity:  Recurrent Context: not caffeine and not stimulant use   Context comment:  Woke him up out of sleep today and has been intermittant ever since. Patient with hx of CAD, HTN, and recent visit for same palpitations Relieved by:  Nothing Worsened by:  Nothing Ineffective treatments:  None tried Associated symptoms: chest pressure and shortness of breath (only during palpitations)   Associated symptoms: no back pain, no diaphoresis, no dizziness, no nausea and no vomiting   Associated symptoms comment:  Fatigued.   Past Medical History  Diagnosis Date  . Hypertension   . CAD (coronary artery disease)     a.  LHC 06/26/12:  pLAD 90%, prox/mid/dist CFS 20-30%, oRCA 100% (non dom), EF 55%;  b. Echo 12/13:  EF 55-60%, Gr 1 diast dysfn;  c. s/p CABG [facility]) 12/13: L-LAD/Dx    . HLD (hyperlipidemia)   . GERD (gastroesophageal reflux disease)   . Ocular migraine     "maybe once q couple months" (06/02/2014)  . Unstable angina pectoris [facility])    Past Surgical History  Procedure Laterality Date  . Nasal septum surgery      For uncontrolled epistaxis early 2013  . Coronary artery bypass graft  06/29/2012    Procedure: CORONARY ARTERY BYPASS GRAFTING (CABG);  Surgeon: Melrose Nakayama, MD;  Location: [facility];  Service: Open Heart Surgery;  Laterality: N/A;  times two using Left Internal Mammary Artery  . Appendectomy  1970's  . Inguinal hernia repair Bilateral  [phone]  . Cardiac catheterization  06/2012  . Left heart catheterization with coronary angiogram N/A 06/26/2012    Procedure: LEFT HEART CATHETERIZATION WITH CORONARY ANGIOGRAM;  Surgeon: Josue Hector, MD;  Location: [facility] C[facility] LAB;  Service: Cardiovascular;  Laterality: N/A;  . Left heart catheterization with coronary/graft angiogram N/A 06/03/2014    Procedure: LEFT HEART CATHETERIZATION WITH Beatrix Fetters;  Surgeon: Jettie Booze, MD;  Location: [facility] CATH LAB;  Service: Cardiovascular;  Laterality: N/A;  . Colonoscopy  10-15 yrs ago    in [place]   Family History  Problem Relation Age of Onset  . Heart disease Brother     CABG age 14, redo bypass ~50  . Heart disease Paternal Grandmother   . Heart disease Cousin     Maternal side  . Heart attack Brother   . Colon cancer Maternal Grandfather 35   Social History  Substance Use Topics  . Smoking status: Never Smoker   . Smokeless tobacco: Never Used  . Alcohol Use: 0.6 oz/week    1 Glasses of wine per week    Review of Systems  Constitutional: Positive for fatigue. Negative for fever, diaphoresis, activity change and appetite change.  HENT: Negative for facial swelling, sore throat, tinnitus, trouble swallowing and voice change.   Eyes: Negative for pain, redness and visual disturbance.  Respiratory: Positive for shortness of breath (only during palpitations). Negative for chest tightness and wheezing.   Cardiovascular: Positive for palpitations. Negative for leg swelling.  Gastrointestinal: Negative for  nausea, vomiting, abdominal pain, diarrhea, constipation and abdominal distention.  Endocrine: Negative.   Genitourinary: Negative.  Negative for dysuria, decreased urine volume, scrotal swelling and testicular pain.  Musculoskeletal: Negative for myalgias, back pain and gait problem.  Skin: Negative.  Negative for rash.  Neurological: Negative.  Negative for dizziness, tremors and headaches.   Psychiatric/Behavioral: Negative for suicidal ideas, hallucinations and self-injury. The patient is not nervous/anxious.       Allergies  Review of patient's allergies indicates no known allergies.  Home Medications   Prior to Admission medications   Medication Sig Start Date End Date Taking? Authorizing Provider  aspirin EC 81 MG tablet Take 1 tablet (81 mg total) by mouth daily. 07/21/12  Yes Liliane Shi, PA-C  atorvastatin (LIPITOR) 40 MG tablet TAKE 1 TABLET BY MOUTH EVERY DAY AT 6 PM 08/31/15  Yes Doe-Hyun R Shawna Orleans, DO  Coenzyme Q10 200 MG capsule Take 200 mg by mouth every morning. disontinue while in hospital   Yes Historical Provider, MD  ibuprofen (ADVIL,MOTRIN) 200 MG tablet Take 600 mg by mouth every 6 (six) hours as needed. migraine headache   Yes Historical Provider, MD  lisinopril (PRINIVIL,ZESTRIL) 20 MG tablet Take 1 tablet (20 mg total) by mouth daily. 02/20/15  Yes Doe-Hyun Kyra Searles, DO  Multiple Vitamin (MULTIVITAMIN) tablet Take 1 tablet by mouth daily.   Yes Historical Provider, MD  nebivolol (BYSTOLIC) 10 MG tablet Take 1 tablet (10 mg total) by mouth daily. TAKE 1 TABLET BY MOUTH DAILY 08/29/15  Yes Marletta Lor, MD  zolpidem (AMBIEN) 5 MG tablet Take 1 tablet (5 mg total) by mouth at bedtime as needed for sleep. 10/24/14  Yes Doe-Hyun R Shawna Orleans, DO  nitroGLYCERIN (NITROSTAT) 0.4 MG SL tablet Place 1 tablet (0.4 mg total) under the tongue every 5 (five) minutes as needed for chest pain. 11/29/13   Josue Hector, MD  sildenafil (VIAGRA) 100 MG tablet Take 0.5-1 tablets (50-100 mg total) by mouth daily as needed for erectile dysfunction. 06/23/15   Doe-Hyun R Shawna Orleans, DO   BP 177/124 mmHg  Pulse 69  Temp(Src) 97.9 F (36.6 C) (Oral)  Resp 23  Wt 83.915 kg  SpO2 98% Physical Exam  Constitutional: He is oriented to person, place, and time. He appears well-developed and well-nourished. No distress.  HENT:  Head: Normocephalic and atraumatic.  Right Ear: External ear normal.   Left Ear: External ear normal.  Nose: Nose normal.  Eyes: Conjunctivae and EOM are normal. Pupils are equal, round, and reactive to light. No scleral icterus.  Neck: Normal range of motion. Neck supple. No JVD present. No tracheal deviation present. No thyromegaly present.  Cardiovascular: Intact distal pulses.   Irregular rhythm, low 100's  Pulmonary/Chest: Effort normal and breath sounds normal. No stridor. No respiratory distress. He has no wheezes. He has no rales.  Abdominal: Soft. He exhibits no distension. There is no tenderness. There is no rebound and no guarding.  Musculoskeletal: Normal range of motion. He exhibits no edema or tenderness.  Neurological: He is alert and oriented to person, place, and time. No cranial nerve deficit. He exhibits normal muscle tone. Coordination normal.  5/5 strength in all 4 extremities. Normal Gait.  GCS 15. Speech normal  Skin: Skin is warm and dry. No rash noted. He is not diaphoretic.  Psychiatric: He has a normal mood and affect. His behavior is normal.  Nursing note and vitals reviewed.   ED Course  Procedures (including critical care time) Labs Review  Labs Reviewed  COMPREHENSIVE METABOLIC PANEL - Abnormal; Notable for the following:    Glucose, Bld 110 (*)    BUN <5 (*)    All other components within normal limits  CBC  PROTIME-INR  APTT  CBC  TSH  TROPONIN I  TROPONIN I  TROPONIN I  MAGNESIUM  I-STAT TROPOININ, ED    Imaging Review No results found. I have personally reviewed and evaluated these images and lab results as part of my medical decision-making.   EKG Interpretation   Date/Time:  Thursday October 19 2015 11:36:40 EDT Ventricular Rate:  109 PR Interval:  168 QRS Duration: 94 QT Interval:  324 QTC Calculation: 436 R Axis:   57 Text Interpretation:  sinus rhtyhm, ?intermittent a fib Nonspecific ST  abnormality Abnormal ECG tachycardic rythm is new since last tracing  Confirmed by KNAPP  MD-J, JON [id number]) on  10/19/2015 12:03:32 PM      MDM   Final diagnoses:  Palpitations    The patient is a 60 year old male with a history of coronary artery disease who presents for the second time in 3 days for palpitations. The patient reports intermittent palpitations or fast and irregular in nature. He reports shortness of breath and chest pressure during these episodes which has made it difficult for him to work. He denies any loss of consciousness. EKG shows intermittent normal sinus tachycardia and pauses. Laboratory evaluation unremarkable. Cardiology's consult to do evaluate the patient. They'll admit the patient for further care. Patient's versus understanding and agreement with this plan.  Patient seen with attending, Dr. Tomi Bamberger, who oversaw clinical decision making.     Margaretann Loveless, MD 10/19/15 [place], MD 10/19/15 [phone]

## 2015-10-19 NOTE — ED Notes (Signed)
Attempted report x1. 

## 2015-10-20 ENCOUNTER — Observation Stay (HOSPITAL_BASED_OUTPATIENT_CLINIC_OR_DEPARTMENT_OTHER): Payer: 59

## 2015-10-20 ENCOUNTER — Encounter: Payer: 59 | Admitting: Cardiovascular Disease

## 2015-10-20 DIAGNOSIS — E782 Mixed hyperlipidemia: Secondary | ICD-10-CM

## 2015-10-20 DIAGNOSIS — R7989 Other specified abnormal findings of blood chemistry: Secondary | ICD-10-CM

## 2015-10-20 DIAGNOSIS — I2510 Atherosclerotic heart disease of native coronary artery without angina pectoris: Secondary | ICD-10-CM | POA: Diagnosis not present

## 2015-10-20 DIAGNOSIS — R002 Palpitations: Secondary | ICD-10-CM | POA: Diagnosis not present

## 2015-10-20 DIAGNOSIS — I951 Orthostatic hypotension: Secondary | ICD-10-CM | POA: Diagnosis not present

## 2015-10-20 DIAGNOSIS — I495 Sick sinus syndrome: Secondary | ICD-10-CM | POA: Diagnosis not present

## 2015-10-20 LAB — TROPONIN I

## 2015-10-20 LAB — ECHOCARDIOGRAM COMPLETE
HEIGHTINCHES: 71 in
WEIGHTICAEL: 2960 [oz_av]

## 2015-10-20 LAB — BASIC METABOLIC PANEL
Anion gap: 8 (ref 5–15)
BUN: 7 mg/dL (ref 6–20)
CHLORIDE: 104 mmol/L (ref 101–111)
CO2: 26 mmol/L (ref 22–32)
Calcium: 8.8 mg/dL — ABNORMAL LOW (ref 8.9–10.3)
Creatinine, Ser: 1.52 mg/dL — ABNORMAL HIGH (ref 0.61–1.24)
GFR calc non Af Amer: 47 mL/min — ABNORMAL LOW (ref >60)
GFR, EST AFRICAN AMERICAN: 55 mL/min — AB (ref >60)
GLUCOSE: 110 mg/dL — AB (ref 65–99)
Potassium: 3.8 mmol/L (ref 3.5–5.1)
SODIUM: 138 mmol/L (ref 135–145)

## 2015-10-20 MED ORDER — METOPROLOL TARTRATE 25 MG PO TABS
25.0000 mg | ORAL_TABLET | Freq: Three times a day (TID) | ORAL | Status: DC
  Administered 2015-10-20 – 2015-10-21 (×4): 25 mg via ORAL
  Filled 2015-10-20 (×4): qty 1

## 2015-10-20 MED ORDER — SODIUM CHLORIDE 0.9 % IV BOLUS (SEPSIS)
200.0000 mL | Freq: Once | INTRAVENOUS | Status: AC
  Administered 2015-10-20: 200 mL via INTRAVENOUS

## 2015-10-20 MED ORDER — SODIUM CHLORIDE 0.9 % IV SOLN
INTRAVENOUS | Status: DC
  Administered 2015-10-20 – 2015-10-23 (×4): via INTRAVENOUS

## 2015-10-20 NOTE — Progress Notes (Addendum)
Called to pt room, pt reports feeling of "I am going to pass out." Telemetry strips reviewed. Pt continues to have sinus tachycardia and SVT. Scheduled medication given. Simmons,PA notified. No new orders. Will continue to monitor. Loree Fee, RN aware.

## 2015-10-20 NOTE — Progress Notes (Signed)
Echocardiogram 2D Echocardiogram has been performed.  Terry Hansen 10/20/2015, 1:56 PM

## 2015-10-20 NOTE — Progress Notes (Signed)
Patient Profile: 60 year old male who has history of coronary artery disease status post CABG in 2013 with a LIMA to the LAD and diagonal. Presented with complaint of extreme fatigue and mild chest pressure. Enzymes negative x 3. Found to have sinus node dysfunction. Telemetry has shown sinus rhythm with frequent PACs followed by pauses as well as bursts of SVT. Electrolytes are normal with a K of 4.4 and MG 1.8. TSH WNL. 2D echo pending.   Subjective: Still feels tired. He notes occasional palpitations. No chest pain or dyspnea.   Objective: Vital signs in last 24 hours: Temp:  [97.4 F (36.3 C)-98.2 F (36.8 C)] 97.6 F (36.4 C) (03/31 0500) Pulse Rate:  [38-89] 58 (03/31 0600) Resp:  [0-32] 0 (03/31 0600) BP: (90-179)/(58-124) 98/65 mmHg (03/31 0600) SpO2:  [93 %-100 %] 96 % (03/31 0600) Weight:  [185 lb (83.915 kg)] 185 lb (83.915 kg) (03/30 1719) Last BM Date: 10/19/15  Intake/Output from previous day: 03/30 0701 - 03/31 0700 In: 360 [P.O.:360] Out: 1075 [Urine:1075] Intake/Output this shift:    Medications Current Facility-Administered Medications  Medication Dose Route Frequency Provider Last Rate Last Dose  . aspirin EC tablet 81 mg  81 mg Oral Daily Brett Canales, PA-C   81 mg at 10/19/15 1530  . atorvastatin (LIPITOR) tablet 40 mg  40 mg Oral q1800 Brett Canales, PA-C   40 mg at 10/19/15 [id number]  . heparin injection 5,000 Units  5,000 Units Subcutaneous 3 times per day Brett Canales, PA-C   5,000 Units at 10/20/15 [id number]  . nebivolol (BYSTOLIC) tablet 10 mg  10 mg Oral Daily Brett Canales, PA-C      . sodium chloride flush (NS) 0.9 % injection 3 mL  3 mL Intravenous Q12H Brett Canales, PA-C   3 mL at 10/19/15 2248  . traZODone (DESYREL) tablet 50 mg  50 mg Oral QHS PRN Soyla Dryer, MD   50 mg at 10/20/15 0012  . verapamil (CALAN) tablet 80 mg  80 mg Oral 3 times per day Brett Canales, PA-C   Stopped at 10/20/15 [phone]    PE: General appearance: alert, cooperative  and no distress Neck: no carotid bruit and no JVD Lungs: clear to auscultation bilaterally Heart: irregular, tachy rate Extremities: no LEE Pulses: 2+ and symmetric Skin: warm and dry Neurologic: Grossly normal  Lab Results:   Recent Labs  10/19/15 1144 10/19/15 1601  WBC 7.0 8.9  HGB 16.8 16.4  HCT 48.7 47.3  PLT 219 203   BMET  Recent Labs  10/19/15 1144 10/20/15 0450  NA 138 138  K 4.4 3.8  CL 104 104  CO2 26 26  GLUCOSE 110* 110*  BUN <5* 7  CREATININE 0.93 1.52*  CALCIUM 9.3 8.8*   PT/INR  Recent Labs  10/19/15 1144  LABPROT 13.9  INR 1.05   Cardiac Panel (last 3 results)  Recent Labs  10/19/15 1601 10/19/15 2055 10/20/15 0455  TROPONINI <0.03 <0.03 <0.03   Lipid Panel     Component Value Date/Time   CHOL 106 08/17/2015 0858   TRIG 52.0 08/17/2015 0858   HDL 46.10 08/17/2015 0858   CHOLHDL 2 08/17/2015 0858   VLDL 10.4 08/17/2015 0858   LDLCALC 50 08/17/2015 0858    Studies/Results: 2D echo - pending   Assessment/Plan  Principal Problem:   Chronotropic incompetence with sinus node dysfunction (HCC) Active Problems:   Mixed hyperlipidemia   CAD (coronary artery disease)   HTN (  hypertension)   Palpitations    1. Sinus Node Dysfunction: still with fluctuating rates, currently ranging from the 80s-140s on telemetry. Mixed SR, sinus tach and SVT. His main complaint is fatigue. K, Mg and TSH all WNL. Per RN, his verapamil was held last PM due to hypotension. Current SBP in the low 100s. Continue Verapamil and nebivolol for rate. Can give IVFs if he becomes hypotensive. 2D echo ordered. We may need to consult EP for further recommendations.  2. CAD: status post CABG in 2013 with a LIMA to the LAD and diagonal. No anginal symptomatology.  Cardiac enzyme negative x 3. 2D echo pending. Continue ASA, statin and BB. ACE-I is on hold to avoid hypotension.   3. HTN: soft but stable. His home lisinopril is on hold to allow treatment of  tachycardia with rate control agents, verapamil and nebivolol.    Brittainy M. Ladoris Gene 10/20/2015 8:10 AM   Patient seen and examined. Agree with assessment and plan.  Apparently last evening, the patient's blood pressure decreased into the low 90s.  As result, 2 of his doses of verapamil were held.  This morning, he continues to have bursts of probable atrial tachycardia alternating with sinus rhythm and PACs.  I suspect that he is somewhat dry from a volume standpoint.  His creatinine had risen to 1.5 to most likely due to reduced renal perfusion in the setting of recurrent tachycardia and hypotension.  I will start normal saline initially with a 200 mg bolus and then 75.  He sees per hour.  He will reinstitute verapamil at 80 mg every 8 hours.  I will also discontinue Bystolic and change this to metoprolol tartrate 25 mg every 8 hours.  A 2-D echo Doppler study will be obtained today.  If despite institution of the above medical regimen.  He continues to have have bursts of tachycardia dysrhythmia, consider EP evaluation.  He is status post CABG and denies any chest pain.  Troponins are negative.  LDL is is 50 and at target and he continues to tolerate atorvastatin 40 mg daily.   Troy Sine, MD, [facility] 10/20/2015 11:36 AM

## 2015-10-21 DIAGNOSIS — R002 Palpitations: Secondary | ICD-10-CM | POA: Diagnosis not present

## 2015-10-21 DIAGNOSIS — I495 Sick sinus syndrome: Secondary | ICD-10-CM | POA: Diagnosis not present

## 2015-10-21 DIAGNOSIS — I471 Supraventricular tachycardia: Secondary | ICD-10-CM | POA: Diagnosis not present

## 2015-10-21 MED ORDER — METOPROLOL TARTRATE 50 MG PO TABS
50.0000 mg | ORAL_TABLET | Freq: Two times a day (BID) | ORAL | Status: DC
  Administered 2015-10-21 – 2015-10-22 (×2): 50 mg via ORAL
  Filled 2015-10-21 (×2): qty 1

## 2015-10-21 NOTE — Progress Notes (Signed)
    Subjective:  Denies CP or dyspnea   Objective:  Filed Vitals:   10/20/15 2000 10/20/15 2317 10/21/15 0339 10/21/15 0843  BP: 120/68 103/61 100/55 123/77  Pulse: 50 67 100 62  Temp: 98.1 F (36.7 C) 98.3 F (36.8 C) 98 F (36.7 C) 97.8 F (36.6 C)  TempSrc: Oral Oral Oral Oral  Resp: 19 13 16 17   Height:      Weight:      SpO2: 96% 94% 95% 98%    Intake/Output from previous day:  Intake/Output Summary (Last 24 hours) at 10/21/15 1156 Last data filed at 10/21/15 0900  Gross per 24 hour  Intake 2071.25 ml  Output   1625 ml  Net 446.25 ml    Physical Exam: Physical exam: Well-developed well-nourished in no acute distress.  Skin is warm and dry.  HEENT is normal.  Neck is supple.  Chest is clear to auscultation with normal expansion.  Cardiovascular exam is regular rate and rhythm.  Abdominal exam nontender or distended. No masses palpated. Extremities show no edema. neuro grossly intact    Lab Results: Basic Metabolic Panel:  Recent Labs  10/19/15 1144 10/19/15 1601 10/20/15 0450  NA 138  --  138  K 4.4  --  3.8  CL 104  --  104  CO2 26  --  26  GLUCOSE 110*  --  110*  BUN <5*  --  7  CREATININE 0.93  --  1.52*  CALCIUM 9.3  --  8.8*  MG  --  1.9  --    CBC:  Recent Labs  10/19/15 1144 10/19/15 1601  WBC 7.0 8.9  HGB 16.8 16.4  HCT 48.7 47.3  MCV 85.9 85.1  PLT 219 203   Cardiac Enzymes:  Recent Labs  10/19/15 1601 10/19/15 2055 10/20/15 0455  TROPONINI <0.03 <0.03 <0.03   Telemetry-reviewed personally.Patient has sinus rhythm with bursts of paroxysmal atrial tachycardia and occasional nonsustained ventricular tachycardia.  Assessment/Plan:  1 paroxysmal atrial tachycardia/VT-echocardiogram shows preserved LV function. He continues to have episodes of this. TSH normal. Discontinue verapamil. Increase metoprolol to 50 mg twice a day. If episodes persist he may need an antiarrhythmic. 2 question bicuspid aortic valve-noted on  echocardiogram. TEE recommended to exclude vegetation. However he is not having fever or other symptoms that would clinically suggest endocarditis. We will avoid at this point. 3 coronary artery disease-continue aspirin and statin.  Kirk Ruths 10/21/2015, 11:56 AM

## 2015-10-22 DIAGNOSIS — I471 Supraventricular tachycardia: Secondary | ICD-10-CM

## 2015-10-22 DIAGNOSIS — I4719 Other supraventricular tachycardia: Secondary | ICD-10-CM

## 2015-10-22 MED ORDER — METOPROLOL TARTRATE 25 MG PO TABS
75.0000 mg | ORAL_TABLET | Freq: Two times a day (BID) | ORAL | Status: DC
  Administered 2015-10-22 – 2015-10-23 (×2): 75 mg via ORAL
  Filled 2015-10-22 (×2): qty 1

## 2015-10-22 NOTE — Progress Notes (Signed)
    Subjective:  Denies CP or dyspnea   Objective:  Filed Vitals:   10/21/15 1947 10/21/15 2334 10/22/15 0344 10/22/15 0730  BP: 141/85 133/74 148/86 131/83  Pulse: 69 66 62 61  Temp: 98.2 F (36.8 C) 98.1 F (36.7 C) 98.4 F (36.9 C) 97.9 F (36.6 C)  TempSrc: Oral Oral Oral Oral  Resp: 17 10 19 11   Height:      Weight:      SpO2: 99% 97% 93% 96%    Intake/Output from previous day:  Intake/Output Summary (Last 24 hours) at 10/22/15 1008 Last data filed at 10/22/15 0730  Gross per 24 hour  Intake 2167.5 ml  Output   2900 ml  Net -732.5 ml    Physical Exam: Physical exam: Well-developed well-nourished in no acute distress.  Skin is warm and dry.  HEENT is normal.  Neck is supple.  Chest is clear to auscultation with normal expansion.  Cardiovascular exam is regular rate and rhythm.  Abdominal exam nontender or distended. No masses palpated. Extremities show no edema. neuro grossly intact    Lab Results: Basic Metabolic Panel:  Recent Labs  10/19/15 1144 10/19/15 1601 10/20/15 0450  NA 138  --  138  K 4.4  --  3.8  CL 104  --  104  CO2 26  --  26  GLUCOSE 110*  --  110*  BUN <5*  --  7  CREATININE 0.93  --  1.52*  CALCIUM 9.3  --  8.8*  MG  --  1.9  --    CBC:  Recent Labs  10/19/15 1144 10/19/15 1601  WBC 7.0 8.9  HGB 16.8 16.4  HCT 48.7 47.3  MCV 85.9 85.1  PLT 219 203   Cardiac Enzymes:  Recent Labs  10/19/15 1601 10/19/15 2055 10/20/15 0455  TROPONINI <0.03 <0.03 <0.03   Telemetry-reviewed personally.Patient now with sinus  Assessment/Plan:  1 paroxysmal atrial tachycardia/VT-echocardiogram shows preserved LV function. Much improved on higher dose metoprolol. Increase metoprolol to 75 mg BID; transfer to telemetry; ambulate. Possible DC in AM if stable. Outpt nuclear study. 2 question bicuspid aortic valve-noted on echocardiogram. TEE recommended to exclude vegetation. However he is not having fever or other symptoms that  would clinically suggest endocarditis. We will avoid at this point. Will need fu echos in the future. 3 coronary artery disease-continue aspirin and statin. 4 Acute kidney disease - Cr elevated this AM; ? Lab error. Recheck in AM.  Kirk Ruths 10/22/2015, 10:08 AM

## 2015-10-22 NOTE — Progress Notes (Signed)
This encounter was created in error - please disregard.

## 2015-10-22 NOTE — Progress Notes (Signed)
Called report to RN on [facility].

## 2015-10-23 ENCOUNTER — Other Ambulatory Visit: Payer: Self-pay | Admitting: Physician Assistant

## 2015-10-23 ENCOUNTER — Other Ambulatory Visit: Payer: Self-pay | Admitting: *Deleted

## 2015-10-23 ENCOUNTER — Other Ambulatory Visit: Payer: Self-pay | Admitting: Cardiovascular Disease

## 2015-10-23 DIAGNOSIS — I2510 Atherosclerotic heart disease of native coronary artery without angina pectoris: Secondary | ICD-10-CM | POA: Diagnosis not present

## 2015-10-23 DIAGNOSIS — I350 Nonrheumatic aortic (valve) stenosis: Secondary | ICD-10-CM

## 2015-10-23 DIAGNOSIS — I495 Sick sinus syndrome: Secondary | ICD-10-CM | POA: Diagnosis not present

## 2015-10-23 DIAGNOSIS — I471 Supraventricular tachycardia: Secondary | ICD-10-CM | POA: Diagnosis not present

## 2015-10-23 LAB — BASIC METABOLIC PANEL
ANION GAP: 7 (ref 5–15)
BUN: 7 mg/dL (ref 6–20)
CHLORIDE: 106 mmol/L (ref 101–111)
CO2: 28 mmol/L (ref 22–32)
Calcium: 8.6 mg/dL — ABNORMAL LOW (ref 8.9–10.3)
Creatinine, Ser: 1 mg/dL (ref 0.61–1.24)
GFR calc Af Amer: >60 mL/min (ref >60)
GFR calc non Af Amer: >60 mL/min (ref >60)
GLUCOSE: 96 mg/dL (ref 65–99)
POTASSIUM: 3.9 mmol/L (ref 3.5–5.1)
Sodium: 141 mmol/L (ref 135–145)

## 2015-10-23 MED ORDER — METOPROLOL TARTRATE 75 MG PO TABS: 75.0000 mg | ORAL_TABLET | Freq: Two times a day (BID) | ORAL | Status: DC

## 2015-10-23 MED ORDER — METOPROLOL TARTRATE 25 MG PO TABS: 75.0000 mg | ORAL_TABLET | Freq: Two times a day (BID) | ORAL | Status: DC

## 2015-10-23 NOTE — Discharge Summary (Signed)
Discharge Summary    Patient ID: Terry Hansen,  MRN: [id number], DOB/AGE: 1955/11/14 60 y.o.  Admit date: 10/19/2015 Discharge date: 10/23/2015  Primary Care Provider: Drema Pry Primary Cardiologist:  Johnsie Cancel  Discharge Diagnoses    Principal Problem:   Chronotropic incompetence with sinus node dysfunction [facility]) Active Problems:   Mixed hyperlipidemia   CAD (coronary artery disease)   HTN (hypertension)   Palpitations   PAT (paroxysmal atrial tachycardia) (HCC)   Allergies No Known Allergies  Diagnostic Studies/Procedures    Echocardiogram 10/20/2015 Study Conclusions  - Left ventricle: The cavity size was normal. Systolic function was  normal. The estimated ejection fraction was in the range of 60%  to 65%. Wall motion was normal; there were no regional wall  motion abnormalities. - Aortic valve: Possibly bicuspid; severely thickened, severely  calcified leaflets. Valve mobility was restricted. There was mild  stenosis. There was mild regurgitation. Peak gradient (S): 33 mm  Hg. Valve area (VTI): 1.86 cm^2. Valve area (Vmax): 2.65 cm^2.  Valve area (Vmean): 2.67 cm^2. - Aortic root: The aortic root was normal in size. - Mitral valve: There was mild regurgitation. - Left atrium: The atrium was normal in size. - Right ventricle: Systolic function was normal. - Right atrium: The atrium was normal in size. - Tricuspid valve: There was mild regurgitation. - Pulmonary arteries: Systolic pressure was within the normal  range. - Inferior vena cava: The vessel was normal in size. - Pericardium, extracardiac: There was no pericardial effusion.  Impressions:  - Severely thickened and calcified aortic valve with leaflet  opening restriction.  Unable to determine the number of leaflets on the current study  but possibly bicuspid.  A vegetation can&'t be excluded.  Mild aortic stenosis and mild aortic regurgitation.  Further evaluation with TEE is  recommended. _____________   History of Present Illness      Terry Hansen is a 60 year old male who has history of coronary artery disease status post CABG in 2013 with a LIMA to the LAD and diagonal. He presented to the emergency room with chest pain and cardiac enzymes were negative. He underwent cardiac catheterization on 06/03/14. Previously placed bypass grafts were patent and there was no other critical disease. Medical therapy was recommended for possible small vessel disease. There was also concern for possible GI etiology of his pain and was started on Protonix. Patient also has a history of hypertension and hyperlipidemia. Seen in ER 09/29/14 for atypical pain r/o no ECG changes and thought pain non cardiac and symptoms related to anxiety He has been on ambien and lexapro for this per primary MD. 12/15 beta blocker stopped due to libido and fatigue issues and lisinopril increased,however he restarted bystolic when BP elevated. Last 2-D echocardiogram was December 2013 revealed an ejection fraction of 0000000 grade 1 diastolic dysfunction. Patient was seen in the emergency room 3 days ago for palpitations and chest pain. He reported his heart was beating irregularly and fast at times. EKG was showed sinus rhythm. EKG today shows atrial fibrillation/SVT with rapid ventricular response. The patient reports having having palpitations starting this Saturday. He feels wiped out, fatigued. He's had some chest pressure, approximately 2 out of 10 in intensity. Said very little shortness of breath more than usual. He's not been sleeping well since that time. The patient currently denies nausea, vomiting, fever, chills, orthopnea, dizziness, PND, cough, congestion, abdominal pain, hematochezia, melena, lower extremity edema, claudication.   Hospital Course     Consultants: None  Patient was admitted for observation and started on verapamil 80 mg 3 times daily.  Review of telemetry looks  as though he may have been having bursts of SVT in addition to the sinus tach and sinus rhythm. The following day we discontinued his by systolic and switched him to metoprolol 25 mg every 8 hours. Is also given a bolus of normal saline 200 mL and then started on 75 mL an hour for hypotension.  Echo echocardiogram revealed an ejection fraction of 60-65% with normal wall motion. His aortic valve was severely thickened with leaflet opening restriction. We were unable to determine number of leaflets possibly bicuspid.  Mild aortic stenosis. A vegetation could not be excluded. He was not having any fever or elevated WBCs to suggest endocarditis.  Metoprolol was titrated up to 75 mg twice daily and his heart rate improved.  Patient's overall symptoms improved and April 30 was deemed stable for discharge home by Dr. Johnsie Cancel.  Will arrange outpatient follow-up with electrophysiology. Stress Cardiolite this week or next and follow-up with Dr. Johnsie Cancel in 6 months with an echo on the same day to follow his aortic stenosis. _____________  Discharge Vitals Blood pressure 142/76, pulse 66, temperature 98 F (36.7 C), temperature source Oral, resp. rate 18, height 5\' 11"  (1.803 m), weight 185 lb (83.915 kg), SpO2 97 %.  Filed Weights   10/19/15 1135 10/19/15 1719 10/22/15 1820  Weight: 185 lb (83.915 kg) 185 lb (83.915 kg) 185 lb (83.915 kg)    Labs & Radiologic Studies    CBC No results for input(s): WBC, NEUTROABS, HGB, HCT, MCV, PLT in the last 72 hours. Basic Metabolic Panel  Recent Labs  10/23/15 0620  NA 141  K 3.9  CL 106  CO2 28  GLUCOSE 96  BUN 7  CREATININE 1.00  CALCIUM 8.6*   _____________  Dg Chest 2 View  10/16/2015  CLINICAL DATA:  Intermittent chest pain and weakness for 2 weeks. Worsening symptoms over the last 2 days with tachycardia and tightness. History of CABG. EXAM: CHEST  2 VIEW COMPARISON:  09/29/2014 and 06/01/2014. FINDINGS: The heart size and mediastinal contours are stable  status post median sternotomy and CABG. The lungs are clear. There is no pleural effusion. No acute osseous findings are seen. IMPRESSION: Stable postoperative chest.  No active cardiopulmonary process. Electronically Signed   By: Richardean Sale M.D.   On: 10/16/2015 13:58   Disposition   Pt is being discharged home today in good condition.  Follow-up Plans & Appointments    Follow-up Information    Follow up with Will Meredith Leeds, MD On 11/03/2015.   Specialty:  Cardiology   Why:  3:00 PM   Contact information:   [address] [place] [address] [phone]       Follow up with Jenkins Rouge, MD.   Specialty:  Cardiology   Why:  Your will be added to our recall list for this appt.  We have also written and order to schedule an echocardiogram in six months as well.    Contact information:   [id number] [place] [address] [phone]      Discharge Instructions    Diet - low sodium heart healthy    Complete by:  As directed      Increase activity slowly    Complete by:  As directed            Discharge Medications   Current Discharge Medication  List    START taking these medications   Details  Metoprolol Tartrate 75 MG TABS Take 75 mg by mouth 2 (two) times daily. Qty: 60 tablet, Refills: 11      CONTINUE these medications which have NOT CHANGED   Details  aspirin EC 81 MG tablet Take 1 tablet (81 mg total) by mouth daily.    atorvastatin (LIPITOR) 40 MG tablet TAKE 1 TABLET BY MOUTH EVERY DAY AT 6 PM Qty: 90 tablet, Refills: 1    Coenzyme Q10 200 MG capsule Take 200 mg by mouth every morning. disontinue while in hospital    ibuprofen (ADVIL,MOTRIN) 200 MG tablet Take 600 mg by mouth every 6 (six) hours as needed. migraine headache    Multiple Vitamin (MULTIVITAMIN) tablet Take 1 tablet by mouth daily.    zolpidem (AMBIEN) 5 MG tablet Take 1 tablet (5 mg total) by mouth at bedtime as needed for sleep. Qty: 30 tablet,  Refills: 3    nitroGLYCERIN (NITROSTAT) 0.4 MG SL tablet Place 1 tablet (0.4 mg total) under the tongue every 5 (five) minutes as needed for chest pain. Qty: 25 tablet, Refills: 3    sildenafil (VIAGRA) 100 MG tablet Take 0.5-1 tablets (50-100 mg total) by mouth daily as needed for erectile dysfunction. Qty: 10 tablet, Refills: 1      STOP taking these medications     lisinopril (PRINIVIL,ZESTRIL) 20 MG tablet      nebivolol (BYSTOLIC) 10 MG tablet             Outstanding Labs/Studies     Duration of Discharge Encounter   Greater than 30 minutes including physician time.  Signed, Samara Snide, [place] PAC 10/23/2015, 10:51 AM

## 2015-10-23 NOTE — Progress Notes (Signed)
Patient ID: Rosbel Amador, male   DOB: Sep 06, 1955, 60 y.o.   MRN: [id number]    Subjective:  Denies CP or dyspnea   Objective:  Filed Vitals:   10/22/15 1820 10/22/15 1821 10/22/15 2049 10/23/15 0517  BP:  142/88 131/77 142/76  Pulse:  76 66 66  Temp:  97.6 F (36.4 C) 98 F (36.7 C) 98 F (36.7 C)  TempSrc:  Oral Oral Oral  Resp:  16 16 18   Height: 5\' 11"  (1.803 m)     Weight: 83.915 kg (185 lb)     SpO2:  99% 98% 97%    Intake/Output from previous day:  Intake/Output Summary (Last 24 hours) at 10/23/15 0948 Last data filed at 10/23/15 [id number]  Gross per 24 hour  Intake 2186.25 ml  Output   1750 ml  Net 436.25 ml    Physical Exam: Physical exam: Well-developed well-nourished in no acute distress.  Skin is warm and dry.  HEENT is normal.  Neck is supple.  Chest is clear to auscultation with normal expansion.  Cardiovascular exam is regular rate and rhythm. AS murmur mild  Abdominal exam nontender or distended. No masses palpated. Extremities show no edema. neuro grossly intact  Telemetry:  NSR rates 80  10/23/2015   Lab Results: Basic Metabolic Panel:  Recent Labs  10/23/15 0620  NA 141  K 3.9  CL 106  CO2 28  GLUCOSE 96  BUN 7  CREATININE 1.00  CALCIUM 8.6*    Assessment/Plan:  1 paroxysmal atrial tachycardia/VT-echocardiogram shows preserved LV function. Much improved on higher dose metoprolol. Increased metoprolol to 75 mg BID Outpatient EP f/u 2 question bicuspid aortic valve-noted on echocardiogram. TEE recommended to exclude vegetation. However he is not having fever or other symptoms that would clinically suggest endocarditis. We will avoid at this point. Will need fu echos in 6 months AS only mild  3 coronary artery disease-continue aspirin and statin. Post CABG  See note will arrange exercise myovue in next week or two 4 Acute kidney disease -  CR back down to one improved  D/C home EP f/u next available Myovue this week/next F/U with me 6  months with echo same day for AS  Jenkins Rouge 10/23/2015, 9:48 AM

## 2015-10-23 NOTE — Progress Notes (Signed)
NURSING PROGRESS NOTE  Terry Hansen [id number] Discharge Data: 10/23/2015 11:20 AM Attending Provider: Josue Hector, MD [id number] Shawna Orleans, DO     Georgie Chard to be D/C'd Home per MD order.  Discussed with the patient the After Visit Summary and all questions fully answered. All IV's discontinued with no bleeding noted. All belongings returned to patient for patient to take home.   Last Vital Signs:  Blood pressure 142/76, pulse 66, temperature 98 F (36.7 C), temperature source Oral, resp. rate 18, height 5\' 11"  (1.803 m), weight 83.915 kg (185 lb), SpO2 97 %.  Discharge Medication List   Medication List    STOP taking these medications        lisinopril 20 MG tablet  Commonly known as:  PRINIVIL,ZESTRIL     nebivolol 10 MG tablet  Commonly known as:  BYSTOLIC      TAKE these medications        aspirin EC 81 MG tablet  Take 1 tablet (81 mg total) by mouth daily.     atorvastatin 40 MG tablet  Commonly known as:  LIPITOR  TAKE 1 TABLET BY MOUTH EVERY DAY AT 6 PM     Coenzyme Q10 200 MG capsule  Take 200 mg by mouth every morning. disontinue while in hospital     ibuprofen 200 MG tablet  Commonly known as:  ADVIL,MOTRIN  Take 600 mg by mouth every 6 (six) hours as needed. migraine headache     Metoprolol Tartrate 75 MG Tabs  Take 75 mg by mouth 2 (two) times daily.     multivitamin tablet  Take 1 tablet by mouth daily.     nitroGLYCERIN 0.4 MG SL tablet  Commonly known as:  NITROSTAT  Place 1 tablet (0.4 mg total) under the tongue every 5 (five) minutes as needed for chest pain.     sildenafil 100 MG tablet  Commonly known as:  VIAGRA  Take 0.5-1 tablets (50-100 mg total) by mouth daily as needed for erectile dysfunction.     zolpidem 5 MG tablet  Commonly known as:  AMBIEN  Take 1 tablet (5 mg total) by mouth at bedtime as needed for sleep.         Doristine Devoid, RN

## 2015-10-23 NOTE — Care Management Note (Signed)
Case Management Note  Patient Details  Name: Franc Levasseur MRN: [id number] Date of Birth: 1956/01/19  Subjective/Objective:                 Independent patient from home, in obs for palpitations.   Action/Plan:  Will DC to home today self care. Expected Discharge Date:                  Expected Discharge Plan:  Home/Self Care  In-House Referral:     Discharge planning Services  CM Consult  Post Acute Care Choice:  NA Choice offered to:     DME Arranged:    DME Agency:     HH Arranged:    [place] Agency:     Status of Service:  Completed, signed off  Medicare Important Message Given:    Date Medicare IM Given:    Medicare IM give by:    Date Additional Medicare IM Given:    Additional Medicare Important Message give by:     If discussed at [place] of Stay Meetings, dates discussed:    Additional Comments:  Carles Collet, RN 10/23/2015, 11:20 AM

## 2015-10-24 ENCOUNTER — Encounter: Payer: Self-pay | Admitting: Cardiovascular Disease

## 2015-10-24 NOTE — Telephone Encounter (Signed)
Terry Hansen  10/23/2015  Refill  MRN:  [id number]   Description: 60 year old male  Provider: Juventino Slovak, [place]  Department: [address] [facility]       Call Documentation     No notes of this type exist for this encounter.     Encounter MyChart Messages     No messages in this encounter     Approved      Disp Refills Start End    metoprolol tartrate (LOPRESSOR) 25 MG tablet 180 tablet 3 10/23/2015     Sig - Route:  Take 3 tablets (75 mg total) by mouth 2 (two) times daily. - Oral    Class:  Normal    Authorizing Provider:  Josue Hector, MD    Ordering User:  Juventino Slovak, CMA      Medication Notes     Metoprolol Tartrate 75 MG [place], [place] 10/23/2015 3:17 PM pt stated that his insurance does not cover 75mg  dose      changing to a 3 month supply as requested...    [place] [id number] - [place], [place] [address]

## 2015-10-30 ENCOUNTER — Other Ambulatory Visit: Payer: Self-pay

## 2015-10-30 ENCOUNTER — Emergency Department (HOSPITAL_COMMUNITY)
Admission: EM | Admit: 2015-10-30 | Discharge: 2015-10-30 | Disposition: A | Discharge status: Home / Self Care | Payer: 59 | Attending: Emergency Medicine | Admitting: Emergency Medicine

## 2015-10-30 ENCOUNTER — Encounter (HOSPITAL_COMMUNITY): Payer: Self-pay | Admitting: *Deleted

## 2015-10-30 ENCOUNTER — Encounter: Payer: 59 | Admitting: Internal Medicine

## 2015-10-30 ENCOUNTER — Emergency Department (HOSPITAL_COMMUNITY): Payer: 59

## 2015-10-30 DIAGNOSIS — I25119 Atherosclerotic heart disease of native coronary artery with unspecified angina pectoris: Secondary | ICD-10-CM | POA: Diagnosis not present

## 2015-10-30 DIAGNOSIS — I10 Essential (primary) hypertension: Secondary | ICD-10-CM | POA: Insufficient documentation

## 2015-10-30 DIAGNOSIS — Z951 Presence of aortocoronary bypass graft: Secondary | ICD-10-CM | POA: Insufficient documentation

## 2015-10-30 DIAGNOSIS — Z8719 Personal history of other diseases of the digestive system: Secondary | ICD-10-CM | POA: Diagnosis not present

## 2015-10-30 DIAGNOSIS — Z79899 Other long term (current) drug therapy: Secondary | ICD-10-CM | POA: Insufficient documentation

## 2015-10-30 DIAGNOSIS — E785 Hyperlipidemia, unspecified: Secondary | ICD-10-CM | POA: Insufficient documentation

## 2015-10-30 DIAGNOSIS — Z9889 Other specified postprocedural states: Secondary | ICD-10-CM | POA: Insufficient documentation

## 2015-10-30 DIAGNOSIS — R002 Palpitations: Secondary | ICD-10-CM | POA: Diagnosis present

## 2015-10-30 DIAGNOSIS — Z7982 Long term (current) use of aspirin: Secondary | ICD-10-CM | POA: Insufficient documentation

## 2015-10-30 DIAGNOSIS — I471 Supraventricular tachycardia: Secondary | ICD-10-CM | POA: Insufficient documentation

## 2015-10-30 LAB — BASIC METABOLIC PANEL
ANION GAP: 10 (ref 5–15)
BUN: 11 mg/dL (ref 6–20)
CHLORIDE: 105 mmol/L (ref 101–111)
CO2: 24 mmol/L (ref 22–32)
CREATININE: 1.02 mg/dL (ref 0.61–1.24)
Calcium: 9.4 mg/dL (ref 8.9–10.3)
GFR calc non Af Amer: >60 mL/min (ref >60)
Glucose, Bld: 99 mg/dL (ref 65–99)
POTASSIUM: 4.1 mmol/L (ref 3.5–5.1)
SODIUM: 139 mmol/L (ref 135–145)

## 2015-10-30 LAB — CBC
HEMATOCRIT: 46.4 % (ref 39.0–52.0)
HEMOGLOBIN: 16.3 g/dL (ref 13.0–17.0)
MCH: 29.9 pg (ref 26.0–34.0)
MCHC: 35.1 g/dL (ref 30.0–36.0)
MCV: 85.1 fL (ref 78.0–100.0)
PLATELETS: 239 10*3/uL (ref 150–400)
RBC: 5.45 MIL/uL (ref 4.22–5.81)
RDW: 12.4 % (ref 11.5–15.5)
WBC: 9 10*3/uL (ref 4.0–10.5)

## 2015-10-30 LAB — MAGNESIUM: MAGNESIUM: 1.9 mg/dL (ref 1.7–2.4)

## 2015-10-30 LAB — I-STAT TROPONIN, ED
TROPONIN I, POC: 0 ng/mL (ref 0.00–0.08)
Troponin i, poc: 0 ng/mL (ref 0.00–0.08)

## 2015-10-30 MED ORDER — METOPROLOL TARTRATE 1 MG/ML IV SOLN
5.0000 mg | Freq: Once | INTRAVENOUS | Status: AC
  Administered 2015-10-30: 5 mg via INTRAVENOUS
  Filled 2015-10-30: qty 5

## 2015-10-30 MED ORDER — DILTIAZEM HCL 30 MG PO TABS: 30.0000 mg | ORAL_TABLET | Freq: Three times a day (TID) | ORAL | Status: DC | PRN

## 2015-10-30 MED ORDER — METOPROLOL TARTRATE 25 MG PO TABS
100.0000 mg | ORAL_TABLET | Freq: Once | ORAL | Status: AC
  Administered 2015-10-30: 100 mg via ORAL
  Filled 2015-10-30: qty 4

## 2015-10-30 NOTE — ED Provider Notes (Signed)
CSN: [id number]     Arrival date & time 10/30/15  1418 History   First MD Initiated Contact with Patient 10/30/15 1600     Chief Complaint  Patient presents with  . Palpitations  . Dizziness     (Consider location/radiation/quality/duration/timing/severity/associated sxs/prior Treatment) HPI 60 year old male who presents with palpitations. History of CAD status post CABG, hypertension, hyperlipidemia. Recently admitted to the hospital and discharged 7 days ago after management of for palpitations. Was diagnosed with intermittent atrial tachycardia versus SVT and uptake treated on metoprolol to control his heart rate. Plans were for him to have an outpatient stress test and follow up with an electrophysiologist. States that since discharge initially his symptoms were well controlled. Has been in his usual state of health until today around lunchtime when he developed sudden onset of palpitations, chest pressure, and lightheadedness. The exact same symptoms he had with prior episodes of SVT versus atrial tachycardia. Has been compliant with his beta blocker. He has mowed his lawn earlier this week and done other exertional activities without symptoms. No recent illnesses including nausea, vomiting, diarrhea, fevers, chills. No lower extremity edema, orthopnea, or PND. Past Medical History  Diagnosis Date  . Hypertension   . CAD (coronary artery disease)     a.  LHC 06/26/12:  pLAD 90%, prox/mid/dist CFS 20-30%, oRCA 100% (non dom), EF 55%;  b. Echo 12/13:  EF 55-60%, Gr 1 diast dysfn;  c. s/p CABG [facility]) 12/13: L-LAD/Dx    . HLD (hyperlipidemia)   . GERD (gastroesophageal reflux disease)   . Ocular migraine     "maybe once q couple months" (06/02/2014)  . Unstable angina pectoris [employer])    Past Surgical History  Procedure Laterality Date  . Nasal septum surgery      For uncontrolled epistaxis early 2013  . Coronary artery bypass graft  06/29/2012    Procedure: CORONARY ARTERY BYPASS  GRAFTING (CABG);  Surgeon: Melrose Nakayama, MD;  Location: [place];  Service: Open Heart Surgery;  Laterality: N/A;  times two using Left Internal Mammary Artery  . Appendectomy  1970's  . Inguinal hernia repair Bilateral [phone]  . Cardiac catheterization  06/2012  . Left heart catheterization with coronary angiogram N/A 06/26/2012    Procedure: LEFT HEART CATHETERIZATION WITH CORONARY ANGIOGRAM;  Surgeon: Josue Hector, MD;  [facility] LAB;  Service: Cardiovascular;  Laterality: N/A;  . Left heart catheterization with coronary/graft angiogram N/A 06/03/2014    Procedure: LEFT HEART CATHETERIZATION WITH Beatrix Fetters;  Surgeon: Jettie Booze, MD;  Location: [facility] CATH LAB;  Service: Cardiovascular;  Laterality: N/A;  . Colonoscopy  10-15 yrs ago    in [place]   Family History  Problem Relation Age of Onset  . Heart disease Brother     CABG age 32, redo bypass ~50  . Heart disease Paternal Grandmother   . Heart disease Cousin     Maternal side  . Heart attack Brother   . Colon cancer Maternal Grandfather 72   Social History  Substance Use Topics  . Smoking status: Never Smoker   . Smokeless tobacco: Never Used  . Alcohol Use: 0.6 oz/week    1 Glasses of wine per week    Review of Systems 10/14 systems reviewed and are negative other than those stated in the HPI    Allergies  Review of patient's allergies indicates no known allergies.  Home Medications   Prior to Admission medications   Medication Sig Start Date End Date  Taking? Authorizing Provider  aspirin EC 81 MG tablet Take 1 tablet (81 mg total) by mouth daily. 07/21/12  Yes Liliane Shi, PA-C  atorvastatin (LIPITOR) 40 MG tablet TAKE 1 TABLET BY MOUTH EVERY DAY AT 6 PM 08/31/15  Yes Doe-Hyun R Shawna Orleans, DO  Coenzyme Q10 200 MG capsule Take 200 mg by mouth every morning. disontinue while in hospital   Yes Historical Provider, MD  ibuprofen (ADVIL,MOTRIN) 200 MG tablet Take 600 mg by mouth  every 6 (six) hours as needed. migraine headache   Yes Historical Provider, MD  metoprolol tartrate (LOPRESSOR) 25 MG tablet TAKE 3 TABLETS(75 MG) BY MOUTH TWICE DAILY 10/24/15  Yes Josue Hector, MD  Multiple Vitamin (MULTIVITAMIN) tablet Take 1 tablet by mouth daily.   Yes Historical Provider, MD  nitroGLYCERIN (NITROSTAT) 0.4 MG SL tablet Place 1 tablet (0.4 mg total) under the tongue every 5 (five) minutes as needed for chest pain. 11/29/13  Yes Josue Hector, MD  sildenafil (VIAGRA) 100 MG tablet Take 0.5-1 tablets (50-100 mg total) by mouth daily as needed for erectile dysfunction. 06/23/15  Yes Doe-Hyun R Shawna Orleans, DO  zolpidem (AMBIEN) 5 MG tablet Take 1 tablet (5 mg total) by mouth at bedtime as needed for sleep. 10/24/14  Yes Doe-Hyun R Shawna Orleans, DO  diltiazem (CARDIZEM) 30 MG tablet Take 1 tablet (30 mg total) by mouth every 8 (eight) hours as needed (palpitations or recurrent tachycardia). 10/30/15   Forde Dandy, MD   BP 141/99 mmHg  Pulse 58  Temp(Src) 98.1 F (36.7 C) (Oral)  Resp 17  SpO2 97% Physical Exam Physical Exam  Nursing note and vitals reviewed. Constitutional: Well developed, well nourished, non-toxic, and in no acute distress Head: Normocephalic and atraumatic.  Mouth/Throat: Oropharynx is clear and moist.  Neck: Normal range of motion. Neck supple.  Cardiovascular: Intermittently tachycardic rate and regular rhythm.   Pulmonary/Chest: Effort normal and breath sounds normal. No LE edema. Abdominal: Soft. There is no tenderness. There is no rebound and no guarding.  Musculoskeletal: Normal range of motion.  Neurological: Alert, no facial droop, fluent speech, moves all extremities symmetrically Skin: Skin is warm and dry.  Psychiatric: Cooperative  ED Course  Procedures (including critical care time) Labs Review Labs Reviewed  BASIC METABOLIC PANEL  CBC  MAGNESIUM  I-STAT [place], ED  Randolm Idol, ED    Imaging Review Dg Chest 2 View  10/30/2015  CLINICAL  DATA:  Chest pain and pressure.  Palpitations. EXAM: CHEST  2 VIEW COMPARISON:  10/16/2015 FINDINGS: The heart size and mediastinal contours are within normal limits. Both lungs are clear. The visualized skeletal structures are unremarkable. Prior median sternotomy. IMPRESSION: No active cardiopulmonary disease. Electronically Signed   By: Lorriane Shire M.D.   On: 10/30/2015 14:58   I have personally reviewed and evaluated these images and lab results as part of my medical decision-making.   EKG Interpretation   Date/Time:  Monday October 30 2015 14:25:47 EDT Ventricular Rate:  78 PR Interval:  122 QRS Duration: 100 QT Interval:  380 QTC Calculation: 433 R Axis:   -11 Text Interpretation:  Normal sinus rhythm Incomplete right bundle branch  block Borderline ECG last EKG with atrial tachycardia, now resolved  similar to baseline EKG  No acute ischemic changes  Confirmed by Takeesha Isley MD,  Hinton Dyer [id number]) on 10/30/2015 4:03:46 PM      MDM   Final diagnoses:  Paroxysmal SVT (supraventricular tachycardia) ([place])    60 year old male with history of CAD  status post CABG and history of paroxysmal SVT versus atrial tachycardia who presents with palpitations. On arrival is nontoxic in no acute distress. Is noted to have paroxysms of narrow complex tachycardia. No major electrolyte or metabolic derangements. Has been compliant with his metoprolol. Given 5 mg of IV metoprolol, and he subsequently has had persistent normal sinus rhythm without intermittent SVT or atrial tachycardia. Discussed with Dr. Harl Bowie from cardiology who recommended up titrating metoprolol 100 mg twice a day and prescribing short-acting diltiazem 30 mg every 8 hours as needed for palpitations. Given home dose of metoprolol in the ED which he tolerates well. Dr. Harl Bowie will arrange close outpatient follow-up for this patient. Plan of care was discussed with this patient as well as strict return instructions. He expressed understanding of all  discharge instructions and felt comfortable to plan of care.    Forde Dandy, MD 10/30/15 [phone]

## 2015-10-30 NOTE — Progress Notes (Signed)
Discussed patient with Dr Oleta Mouse, reprseneted with PSVT after recent admission. Given IV metoprolol in ER with improvement of symptoms. Now in sinus rhythm and hemodynamically stable. I have recommended increasing lopressor to 100mg  bid and starting dilt 30mg  po q 8rhs prn palpitations, we will work to get him an earliery follow up.   Zandra Abts MD

## 2015-10-30 NOTE — Discharge Instructions (Signed)
Increase your metoprolol to 100 mg 2 times day. You are written a new prescription for diltiazem, which you should only take as needed for palpitations and dizziness. Return without fail for worsening symptoms including persistent palpitations despite home medications, severe chest pain or shortness of breath, passing out, or any other symptoms concerning to you  Paroxysmal Supraventricular Tachycardia Paroxysmal supraventricular tachycardia (PSVT) is when your heart beats very quickly and then suddenly stops beating so quickly. You may or may not have any symptoms when this occurs. It is usually not dangerous. It can lead to problems if it happens often or it lasts a long time. HOME CARE   Take medicines only as told by your doctor.  Avoid caffeine or limit how much of it you consume as told by your doctor. Caffeine is found in coffee, tea, soda, and chocolate.  Avoid alcohol or limit how much of it you drink as told by your doctor.  Do not smoke.  Try to get at least 7 hours of sleep each night.  Find healthy ways to reduce stress.  Do self-treatments as told by your doctor to slow down your heart (vagus nerve stimulation). Your doctor may tell you to:  Hold your breath and push, as though you are going to the bathroom.  Rub an area on one side of your neck.  Bend forward with your head between your legs.  Bend forward with your head between your legs, then cough.  Rub your eyeballs with your eyes closed.  Maintain a healthy weight.  Get some exercise on most days. Ask your doctor about some good activities for you. GET HELP IF:  You are having episodes of a fast heartbeat more often.  Your episodes are lasting longer.  Your self-treatments to slow down your heart are no longer helping.  You have new symptoms during an episode. GET HELP RIGHT AWAY IF:  You have chest pain.  You have trouble breathing.  You have an episode of a fast heartbeat that lasts longer than 20  minutes.  You pass out (faint). These symptoms may be an emergency. Do not wait to see if the symptoms will go away. Get medical help right away. Call your local emergency services (911 in the [place] Do not drive yourself to the hospital.   This information is not intended to replace advice given to you by your health care provider. Make sure you discuss any questions you have with your health care provider.   Document Released: 07/08/2005 Document Revised: 07/29/2014 Document Reviewed: 12/16/2013 Elsevier Interactive Patient Education [employer].

## 2015-10-30 NOTE — ED Notes (Signed)
Pt reports being admitted two weeks ago for same. Having chest pressure, palpitations and feeling lightheaded. Symptoms returned approx 1 hour ago. ekg done. No acute distress noted at triage.

## 2015-10-30 NOTE — ED Notes (Signed)
Spoke with main lab, they will add on magnesium level.

## 2015-10-31 ENCOUNTER — Telehealth (HOSPITAL_COMMUNITY): Payer: Self-pay | Admitting: *Deleted

## 2015-10-31 NOTE — Telephone Encounter (Signed)
Patient given detailed instructions per Myocardial Perfusion Study Information Sheet for the test on 11/02/15 at 1000. Patient notified to arrive 15 minutes early and that it is imperative to arrive on time for appointment to keep from having the test rescheduled.  If you need to cancel or reschedule your appointment, please call the office within 24 hours of your appointment. Failure to do so may result in a cancellation of your appointment, and a $50 no show fee. Patient verbalized understanding.Terry Hansen    

## 2015-11-02 ENCOUNTER — Ambulatory Visit (HOSPITAL_COMMUNITY): Payer: 59 | Attending: Cardiovascular Disease

## 2015-11-02 DIAGNOSIS — I10 Essential (primary) hypertension: Secondary | ICD-10-CM | POA: Insufficient documentation

## 2015-11-02 DIAGNOSIS — R079 Chest pain, unspecified: Secondary | ICD-10-CM | POA: Insufficient documentation

## 2015-11-02 DIAGNOSIS — I495 Sick sinus syndrome: Secondary | ICD-10-CM | POA: Diagnosis not present

## 2015-11-02 LAB — MYOCARDIAL PERFUSION IMAGING
CHL CUP NUCLEAR SDS: 0
CHL CUP NUCLEAR SRS: 12
CHL CUP RESTING HR STRESS: 71 {beats}/min
CSEPED: 6 min
CSEPEDS: 30 s
Estimated workload: 7.7 METS
LV dias vol: 110 mL (ref 62–150)
LV sys vol: 65 mL
MPHR: 158 {beats}/min
NUC STRESS TID: 1.1
Peak HR: 155 {beats}/min
Percent HR: 98 %
RATE: 0.31
SSS: 12

## 2015-11-02 MED ORDER — TECHNETIUM TC 99M SESTAMIBI GENERIC - CARDIOLITE
31.6000 | Freq: Once | INTRAVENOUS | Status: AC | PRN
  Administered 2015-11-02: 32 via INTRAVENOUS

## 2015-11-02 MED ORDER — TECHNETIUM TC 99M SESTAMIBI GENERIC - CARDIOLITE
11.0000 | Freq: Once | INTRAVENOUS | Status: AC | PRN
  Administered 2015-11-02: 11 via INTRAVENOUS

## 2015-11-03 ENCOUNTER — Encounter: Payer: Self-pay | Admitting: Cardiology

## 2015-11-03 ENCOUNTER — Ambulatory Visit (INDEPENDENT_AMBULATORY_CARE_PROVIDER_SITE_OTHER): Payer: 59 | Admitting: Cardiology

## 2015-11-03 VITALS — BP 124/70 | HR 72 | Ht 71.0 in | Wt 189.6 lb

## 2015-11-03 DIAGNOSIS — I471 Supraventricular tachycardia: Secondary | ICD-10-CM | POA: Diagnosis not present

## 2015-11-03 MED ORDER — METOPROLOL SUCCINATE ER 100 MG PO TB24: 100.0000 mg | ORAL_TABLET | Freq: Two times a day (BID) | ORAL | Status: DC

## 2015-11-03 NOTE — Patient Instructions (Signed)
Medication Instructions:  Your physician recommends that you continue on your current medications as directed. Please refer to the Current Medication list given to you today.   Labwork: None ordered   Testing/Procedures: None ordered   Follow-Up: Your physician wants you to follow-up in: 6 months with Dr [name] You will receive a reminder letter in the mail two months in advance. If you don't receive a letter, please call our office to schedule the follow-up appointment.   Any Other Special Instructions Will Be Listed Below (If Applicable).     If you need a refill on your cardiac medications before your next appointment, please call your pharmacy.   

## 2015-11-03 NOTE — Progress Notes (Signed)
Electrophysiology Office Note   Date:  11/03/2015   ID:  Terry Hansen, DOB July 18, 1956, MRN [id number]  PCP:  Drema Pry, DO  Cardiologist: Johnsie Cancel  Primary Electrophysiologist:  Will Meredith Leeds, MD    No chief complaint on file.    History of Present Illness: Terry Hansen is a 60 y.o. male who presents today for electrophysiology evaluation.   He presented to the hospital at the end of March with palpitations and was found to have an atrial tachycardia and multiple short runs of tachycardia. At that time, he was started on metoprolol 75 mg seems to control his symptoms. He re-presented to the hospital on 4/10 with similar symptoms. His metoprolol was increased to 100 mg twice a day and he was also prescribed diltiazem. Since that time he has had one episode of palpitations, 3 days ago. He took the diltiazem and the palpitations went away on their own. He says that he has been feeling well with out any symptoms. He was has been able to exercise and has mowed the lawn today without problems.  Today, he denies symptoms of palpitations, chest pain, shortness of breath, orthopnea, PND, lower extremity edema, claudication, dizziness, presyncope, syncope, bleeding, or neurologic sequela. The patient is tolerating medications without difficulties and is otherwise without complaint today.    Past Medical History  Diagnosis Date  . Hypertension   . CAD (coronary artery disease)     a.  LHC 06/26/12:  pLAD 90%, prox/mid/dist CFS 20-30%, oRCA 100% (non dom), EF 55%;  b. Echo 12/13:  EF 55-60%, Gr 1 diast dysfn;  c. s/p CABG Roxan Hockey) 12/13: L-LAD/Dx    . HLD (hyperlipidemia)   . GERD (gastroesophageal reflux disease)   . Ocular migraine     "maybe once q couple months" (06/02/2014)  . Unstable angina pectoris [facility])    Past Surgical History  Procedure Laterality Date  . Nasal septum surgery      For uncontrolled epistaxis early 2013  . Coronary artery bypass graft  06/29/2012   Procedure: CORONARY ARTERY BYPASS GRAFTING (CABG);  Surgeon: Melrose Nakayama, MD;  Location: [place];  Service: Open Heart Surgery;  Laterality: N/A;  times two using Left Internal Mammary Artery  . Appendectomy  1970's  . Inguinal hernia repair Bilateral [id number]  . Cardiac catheterization  06/2012  . Left heart catheterization with coronary angiogram N/A 06/26/2012    Procedure: LEFT HEART CATHETERIZATION WITH CORONARY ANGIOGRAM;  Surgeon: Josue Hector, MD;  Location: [facility] CATH LAB;  Service: Cardiovascular;  Laterality: N/A;  . Left heart catheterization with coronary/graft angiogram N/A 06/03/2014    Procedure: LEFT HEART CATHETERIZATION WITH Beatrix Fetters;  Surgeon: Jettie Booze, MD;  Location: [facility] CATH LAB;  Service: Cardiovascular;  Laterality: N/A;  . Colonoscopy  10-15 yrs ago    in [place]     Current Outpatient Prescriptions  Medication Sig Dispense Refill  . aspirin EC 81 MG tablet Take 1 tablet (81 mg total) by mouth daily.    Marland Kitchen atorvastatin (LIPITOR) 40 MG tablet TAKE 1 TABLET BY MOUTH EVERY DAY AT 6 PM 90 tablet 1  . Coenzyme Q10 200 MG capsule Take 200 mg by mouth every morning. disontinue while in hospital    . diltiazem (CARDIZEM) 30 MG tablet Take 1 tablet (30 mg total) by mouth every 8 (eight) hours as needed (palpitations or recurrent tachycardia). 30 tablet 0  . ibuprofen (ADVIL,MOTRIN) 200 MG tablet Take 600 mg by mouth every 6 (six) hours  as needed. migraine headache    . metoprolol tartrate (LOPRESSOR) 25 MG tablet TAKE 3 TABLETS(75 MG) BY MOUTH TWICE DAILY 540 tablet 3  . Multiple Vitamin (MULTIVITAMIN) tablet Take 1 tablet by mouth daily.    . nitroGLYCERIN (NITROSTAT) 0.4 MG SL tablet Place 1 tablet (0.4 mg total) under the tongue every 5 (five) minutes as needed for chest pain. 25 tablet 3  . sildenafil (VIAGRA) 100 MG tablet Take 0.5-1 tablets (50-100 mg total) by mouth daily as needed for erectile dysfunction. 10 tablet 1  . zolpidem  (AMBIEN) 5 MG tablet Take 1 tablet (5 mg total) by mouth at bedtime as needed for sleep. 30 tablet 3   No current facility-administered medications for this visit.    Allergies:   Review of patient's allergies indicates no known allergies.   Social History:  The patient  reports that he has never smoked. He has never used smokeless tobacco. He reports that he drinks about 0.6 oz of alcohol per week. He reports that he does not use illicit drugs.   Family History:  The patient's family history includes Colon cancer (age of onset: 36) in his maternal grandfather; Heart attack in his brother; Heart disease in his brother, cousin, and paternal grandmother.    ROS:  Please see the history of present illness.   Otherwise, review of systems is positive for none.   All other systems are reviewed and negative.    PHYSICAL EXAM: VS:  There were no vitals taken for this visit. , BMI There is no weight on file to calculate BMI. GEN: Well nourished, well developed, in no acute distress HEENT: normal Neck: no JVD, carotid bruits, or masses Cardiac: RRR; no murmurs, rubs, or gallops,no edema  Respiratory:  clear to auscultation bilaterally, normal work of breathing GI: soft, nontender, nondistended, + BS MS: no deformity or atrophy Skin: warm and dry Neuro:  Strength and sensation are intact Psych: euthymic mood, full affect  EKG:  EKG is not ordered today. The ekg ordered 4/12/17shows sinus rhythm, incomplete RBBB  Recent Labs: 10/19/2015: ALT 31; TSH 1.100 10/30/2015: BUN 11; Creatinine, Ser 1.02; Hemoglobin 16.3; Magnesium 1.9; Platelets 239; Potassium 4.1; Sodium 139    Lipid Panel     Component Value Date/Time   CHOL 106 08/17/2015 0858   TRIG 52.0 08/17/2015 0858   HDL 46.10 08/17/2015 0858   CHOLHDL 2 08/17/2015 0858   VLDL 10.4 08/17/2015 0858   LDLCALC 50 08/17/2015 0858     Wt Readings from Last 3 Encounters:  11/02/15 191 lb (86.637 kg)  10/22/15 185 lb (83.915 kg)    10/16/15 191 lb (86.637 kg)      Other studies Reviewed: Additional studies/ records that were reviewed today include: Nuclear stress 11/02/15  Review of the above records today demonstrates:   Nuclear stress EF: 41%.  There was no ST segment deviation noted during stress.  Defect 1: There is a large defect of moderate severity present in the mid anterior, mid inferolateral, apical anterior, apical lateral and apex location.  Findings consistent with prior anteriolateral myocardial infarction. There is no evidence of ischemia.  This is an intermediate risk study.  TTE 10/20/15 - Left ventricle: The cavity size was normal. Systolic function was  normal. The estimated ejection fraction was in the range of 60%  to 65%. Wall motion was normal; there were no regional wall  motion abnormalities. - Aortic valve: Possibly bicuspid; severely thickened, severely  calcified leaflets. Valve mobility was restricted. There  was mild  stenosis. There was mild regurgitation. Peak gradient (S): 33 mm  Hg. Valve area (VTI): 1.86 cm^2. Valve area (Vmax): 2.65 cm^2.  Valve area (Vmean): 2.67 cm^2. - Aortic root: The aortic root was normal in size. - Mitral valve: There was mild regurgitation. - Left atrium: The atrium was normal in size. - Right ventricle: Systolic function was normal. - Right atrium: The atrium was normal in size. - Tricuspid valve: There was mild regurgitation. - Pulmonary arteries: Systolic pressure was within the normal  range. - Inferior vena cava: The vessel was normal in size. - Pericardium, extracardiac: There was no pericardial effusion.  ASSESSMENT AND PLAN:  1.  Atrial tachycardia: Patient has been having multiple bouts of tachycardia, but they have been controlled recently with metoprolol and diltiazem. It is possible that the tachycardia is due to a pulmonary vein source, and if it is, he would be at risk of atrial fibrillation in the future. At this time,  since he is well controlled with his current medical regimen, we'll not make any further changes. I did discuss with him the possibility of ablation in the future should his medications do not control his palpitations. He said that he would call the clinic back if he has further symptoms between now in follow-up.    Current medicines are reviewed at length with the patient today.   The patient does not have concerns regarding his medicines.  The following changes were made today:  none  Labs/ tests ordered today include:  No orders of the defined types were placed in this encounter.     Disposition:   FU with Will [name] 6 months  Signed, Will Meredith Leeds, MD  11/03/2015 7:54 AM     [facility] [address] [place] [address] [phone] (office) [phone] (fax)

## 2015-11-06 ENCOUNTER — Telehealth: Payer: Self-pay | Admitting: Cardiology

## 2015-11-06 NOTE — Telephone Encounter (Signed)
I spoke with the Terry Hansen and he was doing well last Wednesday, Thursday and Friday. Saturday when the Terry Hansen woke up his heart was "beating out of rhythm". The Terry Hansen does monitor his BP and pulse and he has noticed that the BP monitor is showing an irregular pulse.  The Terry Hansen does complain of being lightheaded and has no energy. The Terry Hansen is taking his Metoprolol as prescribed and since Saturday he has been taking PRN Diltiazem every 8 hours.   Monday 138/81, 67 Sunday evening 112/81, 59 and morning 124/68, 59 Saturday 140/89, 67  I will forward this information to Dr Curt Bears for further recommendations.

## 2015-11-06 NOTE — Telephone Encounter (Signed)
Pt has been in an [place] since Friday-pls call  [phone]

## 2015-11-07 MED ORDER — DRONEDARONE HCL 400 MG PO TABS
400.0000 mg | ORAL_TABLET | Freq: Two times a day (BID) | ORAL | Status: DC
Start: 2015-11-07 — End: 2015-12-07

## 2015-11-07 NOTE — Telephone Encounter (Signed)
Informed patient of Dr. Gerarda Fraction order to begin Multaq. Advised patient to continue monitoring BP & HRs and to call office if HR drops below 60 and he is symptomatic. Reviewed the possibility of bradycardia when taking all 3 meds together (if needed) and need to monitor. Patient verbalized understanding and agreeable to plan.  He thanks me for easing his mind and reviewing drug information with him.

## 2015-11-07 NOTE — Telephone Encounter (Signed)
Can we start Multaq 400 mg BID if he is continuing to have symptoms.

## 2015-11-07 NOTE — Telephone Encounter (Signed)
Routing back to [employer] to verify his order -  Do you want patient to begin Multaq and remain taking Metoprolol and Diltiazem?

## 2015-11-07 NOTE — Telephone Encounter (Signed)
Lets go ahead and start it if he is still having symptoms.  I think he would benefit.

## 2015-11-08 ENCOUNTER — Telehealth: Payer: Self-pay

## 2015-11-08 NOTE — Telephone Encounter (Signed)
Prior auth for Tab sent to [facility].

## 2015-11-09 ENCOUNTER — Telehealth: Payer: Self-pay | Admitting: *Deleted

## 2015-11-09 NOTE — Telephone Encounter (Signed)
Please call Rayden Kohring about Multaq and the prior-authorization for [facility].

## 2015-11-10 ENCOUNTER — Telehealth: Payer: Self-pay

## 2015-11-10 NOTE — Telephone Encounter (Signed)
Spoke with this patient about this, as it has been denied. Message sent to Dr. Curt Bears.

## 2015-11-10 NOTE — Telephone Encounter (Signed)
Multaq has been denied by [facility]. I'm assuming because he technically doesn't have Atrial Fib. Patient is aware of denial. I could appeal it, but not sure it will help. Would appreciate your input.

## 2015-11-16 ENCOUNTER — Other Ambulatory Visit: Payer: Self-pay | Admitting: Internal Medicine

## 2015-11-16 ENCOUNTER — Telehealth: Payer: Self-pay | Admitting: Physician Assistant

## 2015-11-16 NOTE — Telephone Encounter (Signed)
Pt is calling in wanting to talk to Tarri Fuller some more about the results from the [place] that he had done on 4/13. Please f/u with him   Thanks

## 2015-11-16 NOTE — Telephone Encounter (Signed)
Returned call to patient. He had remaining questions about stress test findings - stated concern over finding of previous MI.  Notes he mainly wanted to make sure no lifestyle limitations. Acknowledges last echo results good. Just wants further clarification from [facility] on anything he needs to be concerned about. Answered questions as able, pt requests call from [place] as is convenient.

## 2015-11-16 NOTE — Telephone Encounter (Signed)
Dr. Curt Bears states he spoke with [facility] yesterday -- approval for Multaq. Will await their paperwork with approval number.   (spoke with patient who states he is feeling better. Does not want to start this medication at this time.  He will call office if symptoms arise.  I will inform Dr. Curt Bears next week when he is in the office)

## 2015-11-17 ENCOUNTER — Telehealth: Payer: Self-pay

## 2015-11-17 NOTE — Telephone Encounter (Signed)
Received fax from [facility] with approval for [place]. Authorized through 11/14/2016. PA- [id number].

## 2015-11-19 ENCOUNTER — Telehealth: Payer: Self-pay | Admitting: Physician Assistant

## 2015-11-19 NOTE — Telephone Encounter (Signed)
Paged about elevated BP, phone busy when calling back. Will attempt later

## 2015-11-19 NOTE — Telephone Encounter (Signed)
He says he was having some weakness and chest discomfort when his blood pressure was in the 170s, however after taking his medication, his symptom has completely resolved. Since it is so transient, he did not seek medical attention. I have instructed him continue to monitor his blood pressure. If it recurred again and chest discomfort lasted longer than usual, he will need to seek medical attention. Right now he is completely asymptomatic.  Terry Corrigan PA Pager: [phone]

## 2015-11-22 NOTE — Telephone Encounter (Signed)
PER PATIENT, BRYAN DID CALL AND SPEAK TO PATIENT.

## 2015-12-07 ENCOUNTER — Encounter: Payer: Self-pay | Admitting: Family Medicine

## 2015-12-07 ENCOUNTER — Ambulatory Visit (INDEPENDENT_AMBULATORY_CARE_PROVIDER_SITE_OTHER): Payer: 59 | Admitting: Family Medicine

## 2015-12-07 VITALS — BP 130/80 | HR 72 | Temp 97.8°F | Resp 20 | Ht 71.0 in | Wt 193.0 lb

## 2015-12-07 DIAGNOSIS — H5711 Ocular pain, right eye: Secondary | ICD-10-CM

## 2015-12-07 DIAGNOSIS — T1591XS Foreign body on external eye, part unspecified, right eye, sequela: Secondary | ICD-10-CM

## 2015-12-07 MED ORDER — AZITHROMYCIN 1 % OP SOLN: 1.0000 [drp] | Freq: Two times a day (BID) | OPHTHALMIC | Status: DC

## 2015-12-07 NOTE — Progress Notes (Signed)
Subjective:    Patient ID: Terry Hansen, male    DOB: September 14, 1955, 60 y.o.   MRN: [id number]  HPI  Terry Hansen is a 60 year old male presents with right eye redness and discomfort for one day.  Associated symptom of watering is noted.  He reports working on his home and noticing white paint flecks that may have blown in his eye the prior day.  He reports upon wakening this morning, he noted a white "fleck" that came out of his eye with watering. Since that time, his eye discomfort has improved but redness is still noted. He has not tried any treatments at home and denies any aggravating or relieving factors. He denies any visual disturbances or changes in his vision,  eye drainage, itching, rhinitis, congestions, fever, chills, or sweats.   Review of Systems  Constitutional: Negative for fever, chills and fatigue.  HENT: Negative for congestion, postnasal drip, rhinorrhea, sinus pressure and sneezing.   Eyes: Positive for pain and redness. Negative for visual disturbance.  Respiratory: Negative for cough.   Cardiovascular: Negative for chest pain.  Skin: Negative for rash.  Allergic/Immunologic: Negative for environmental allergies.  Neurological: Negative for headaches.   . Past Medical History  Diagnosis Date  . Hypertension   . CAD (coronary artery disease)     a.  LHC 06/26/12:  pLAD 90%, prox/mid/dist CFS 20-30%, oRCA 100% (non dom), EF 55%;  b. Echo 12/13:  EF 55-60%, Gr 1 diast dysfn;  c. s/p CABG Roxan Hockey) 12/13: L-LAD/Dx    . HLD (hyperlipidemia)   . GERD (gastroesophageal reflux disease)   . Ocular migraine     "maybe once q couple months" (06/02/2014)  . Unstable angina pectoris [facility])      Social History   Social History  . Marital Status: Divorced    Spouse Name: N/A  . Number of Children: N/A  . Years of Education: N/A   Occupational History  . Not on file.   Social History Main Topics  . Smoking status: Never Smoker   . Smokeless tobacco: Never Used  .  Alcohol Use: 0.6 oz/week    1 Glasses of wine per week  . Drug Use: No  . Sexual Activity: Not Currently   Other Topics Concern  . Not on file   Social History Narrative   Divorced 7 years ago   3 children   Works in Aeronautical engineer    Past Surgical History  Procedure Laterality Date  . Nasal septum surgery      For uncontrolled epistaxis early 2013  . Coronary artery bypass graft  06/29/2012    Procedure: CORONARY ARTERY BYPASS GRAFTING (CABG);  Surgeon: Melrose Nakayama, MD;  Location: [place];  Service: Open Heart Surgery;  Laterality: N/A;  times two using Left Internal Mammary Artery  . Appendectomy  1970's  . Inguinal hernia repair Bilateral [phone]  . Cardiac catheterization  06/2012  . Left heart catheterization with coronary angiogram N/A 06/26/2012    Procedure: LEFT HEART CATHETERIZATION WITH CORONARY ANGIOGRAM;  Surgeon: Josue Hector, MD;  Location: [facility] C[facility] LAB;  Service: Cardiovascular;  Laterality: N/A;  . Left heart catheterization with coronary/graft angiogram N/A 06/03/2014    Procedure: LEFT HEART CATHETERIZATION WITH Beatrix Fetters;  Surgeon: Jettie Booze, MD;  Location[facility] C[facility] LAB;  Service: Cardiovascular;  Laterality: N/A;  . Colonoscopy  10-15 yrs ago    in [place]    Family History  Problem Relation Age of Onset  .  Heart disease Brother     CABG age 78, redo bypass ~50  . Heart disease Paternal Grandmother   . Heart disease Cousin     Maternal side  . Heart attack Brother   . Colon cancer Maternal Grandfather 60    No Known Allergies  Current Outpatient Prescriptions on File Prior to Visit  Medication Sig Dispense Refill  . aspirin EC 81 MG tablet Take 1 tablet (81 mg total) by mouth daily.    Marland Kitchen atorvastatin (LIPITOR) 40 MG tablet TAKE 1 TABLET BY MOUTH EVERY DAY AT 6 PM 90 tablet 1  . Coenzyme Q10 200 MG capsule Take 200 mg by mouth every morning. disontinue while in hospital    . ibuprofen (ADVIL,MOTRIN) 200  MG tablet Take 600 mg by mouth every 6 (six) hours as needed. migraine headache    . metoprolol succinate (TOPROL-XL) 100 MG 24 hr tablet Take 1 tablet (100 mg total) by mouth 2 (two) times daily. Take with or immediately following a meal. 180 tablet 3  . Multiple Vitamin (MULTIVITAMIN) tablet Take 1 tablet by mouth daily.    . nitroGLYCERIN (NITROSTAT) 0.4 MG SL tablet Place 1 tablet (0.4 mg total) under the tongue every 5 (five) minutes as needed for chest pain. 25 tablet 3  . zolpidem (AMBIEN) 5 MG tablet TAKE 1 TABLET BY MOUTH AT BEDTIME AS NEEDED FOR SLEEP 30 tablet 0   No current facility-administered medications on file prior to visit.    BP 130/80 mmHg  Pulse 72  Temp(Src) 97.8 F (36.6 C) (Oral)  Resp 20  Ht 5\' 11"  (1.803 m)  Wt 193 lb (87.544 kg)  BMI 26.93 kg/m2  SpO2 98%       Objective:   Physical Exam  Constitutional: He is oriented to person, place, and time. He appears well-developed and well-nourished.  HENT:  Right Ear: Tympanic membrane normal.  Left Ear: Tympanic membrane normal.  Nose: No rhinorrhea.  Eyes: Lids are normal. Pupils are equal, round, and reactive to light. Lids are everted and swept, no foreign bodies found. Right conjunctiva is injected. Left conjunctiva is injected.  Fundoscopic exam:      The right eye shows no exudate and no hemorrhage. The right eye shows red reflex.       The left eye shows no exudate and no hemorrhage. The left eye shows red reflex.  Neck: Neck supple.  Cardiovascular: Normal rate.   Pulmonary/Chest: Effort normal.  Lymphadenopathy:    He has no cervical adenopathy.  Neurological: He is alert and oriented to person, place, and time.  Skin: Skin is warm and dry. No rash noted.       Assessment & Plan:   1. Eye discomfort, right History and exam support foreign body likely of a fleck of paint that irritated his eye and is resolving after he noticed it when wash from his eye when watering.    2. Foreign body,  eye, right, sequela Foreign body is suspected based upon exam and history from patient. Short term treatment with azithromycin treatment with follow up if symptoms do not continue to improve or worsen.  - azithromycin (AZASITE) 1 % ophthalmic solution; Place 1 drop into the right eye 2 (two) times daily. Reduce to 1 drop daily after 2 days and continue for 5 more days  Dispense: 2.5 mL; Refill: 0  Advised patient to follow up if he has increasing pain, redness, changes in his vision, or any new symptoms in 2-3 days.  Delano Metz, FNP-C

## 2015-12-07 NOTE — Progress Notes (Signed)
Pre visit review using our clinic review tool, if applicable. No additional management support is needed unless otherwise documented below in the visit note. 

## 2015-12-07 NOTE — Patient Instructions (Signed)
Please use eye drops as directed. If you have increased pain, redness, or additional symptoms after treatment for 2-3 days, please follow up for further evaluation.

## 2016-01-04 ENCOUNTER — Encounter: Payer: Self-pay | Admitting: Family Medicine

## 2016-01-04 ENCOUNTER — Ambulatory Visit (INDEPENDENT_AMBULATORY_CARE_PROVIDER_SITE_OTHER): Payer: 59 | Admitting: Family Medicine

## 2016-01-04 VITALS — BP 136/90 | HR 69 | Temp 97.9°F | Ht 71.0 in | Wt 191.0 lb

## 2016-01-04 DIAGNOSIS — B079 Viral wart, unspecified: Secondary | ICD-10-CM | POA: Diagnosis not present

## 2016-01-04 DIAGNOSIS — B078 Other viral warts: Secondary | ICD-10-CM

## 2016-01-04 NOTE — Progress Notes (Signed)
Subjective:    Patient ID: Terry Hansen, male    DOB: 21-Aug-1955, 60 y.o.   MRN: [id number]  HPI  Mr. Itkin is a a 60 year old male who presents today with a wart like lesion on the tip of his right index finger which has been present for 2 months.  He has been treating with Compound W with moderate benefit sporadically over the past 2 months. Area on right index finger exhibits associated tenderness and occasional bleeding. He reports that he would like this treated due to the visibility on his index finger. No associated aggravating factors noted.  Review of Systems  Constitutional: Negative for fever and chills.  Respiratory: Negative for cough and shortness of breath.   Cardiovascular: Negative for chest pain and palpitations.  Skin:       Lesion on tip of right index finger   Past Medical History  Diagnosis Date  . Hypertension   . CAD (coronary artery disease)     a.  LHC 06/26/12:  pLAD 90%, prox/mid/dist CFS 20-30%, oRCA 100% (non dom), EF 55%;  b. Echo 12/13:  EF 55-60%, Gr 1 diast dysfn;  c. s/p CABG [facility]) 12/13: L-LAD/Dx    . HLD (hyperlipidemia)   . GERD (gastroesophageal reflux disease)   . Ocular migraine     "maybe once q couple months" (06/02/2014)  . Unstable angina pectoris [facility])      Social History   Social History  . Marital Status: Divorced    Spouse Name: N/A  . Number of Children: N/A  . Years of Education: N/A   Occupational History  . Not on file.   Social History Main Topics  . Smoking status: Never Smoker   . Smokeless tobacco: Never Used  . Alcohol Use: 0.6 oz/week    1 Glasses of wine per week  . Drug Use: No  . Sexual Activity: Not Currently   Other Topics Concern  . Not on file   Social History Narrative   Divorced 7 years ago   3 children   Works in Aeronautical engineer    Past Surgical History  Procedure Laterality Date  . Nasal septum surgery      For uncontrolled epistaxis early 2013  . Coronary artery bypass  graft  06/29/2012    Procedure: CORONARY ARTERY BYPASS GRAFTING (CABG);  Surgeon: Melrose Nakayama, MD;  Location: [place];  Service: Open Heart Surgery;  Laterality: N/A;  times two using Left Internal Mammary Artery  . Appendectomy  1970's  . Inguinal hernia repair Bilateral [phone]  . Cardiac catheterization  06/2012  . Left heart catheterization with coronary angiogram N/A 06/26/2012    Procedure: LEFT HEART CATHETERIZATION WITH CORONARY ANGIOGRAM;  Surgeon: Josue Hector, MD;  Location: [facility] CATH LAB;  Service: Cardiovascular;  Laterality: N/A;  . Left heart catheterization with coronary/graft angiogram N/A 06/03/2014    Procedure: LEFT HEART CATHETERIZATION WITH Beatrix Fetters;  Surgeon: Jettie Booze, MD;  Location: [facility] CATH LAB;  Service: Cardiovascular;  Laterality: N/A;  . Colonoscopy  10-15 yrs ago    in [place]    Family History  Problem Relation Age of Onset  . Heart disease Brother     CABG age 2, redo bypass ~52  . Heart disease Paternal Grandmother   . Heart disease Cousin     Maternal side  . Heart attack Brother   . Colon cancer Maternal Grandfather 60    No Known Allergies  Current Outpatient Prescriptions  on File Prior to Visit  Medication Sig Dispense Refill  . aspirin EC 81 MG tablet Take 1 tablet (81 mg total) by mouth daily.    Marland Kitchen atorvastatin (LIPITOR) 40 MG tablet TAKE 1 TABLET BY MOUTH EVERY DAY AT 6 PM 90 tablet 1  . azithromycin (AZASITE) 1 % ophthalmic solution Place 1 drop into the right eye 2 (two) times daily. Reduce to 1 drop daily after 2 days and continue for 5 more days 2.5 mL 0  . Coenzyme Q10 200 MG capsule Take 200 mg by mouth every morning. disontinue while in hospital    . ibuprofen (ADVIL,MOTRIN) 200 MG tablet Take 600 mg by mouth every 6 (six) hours as needed. migraine headache    . metoprolol succinate (TOPROL-XL) 100 MG 24 hr tablet Take 1 tablet (100 mg total) by mouth 2 (two) times daily. Take with or immediately  following a meal. 180 tablet 3  . Multiple Vitamin (MULTIVITAMIN) tablet Take 1 tablet by mouth daily.    . nitroGLYCERIN (NITROSTAT) 0.4 MG SL tablet Place 1 tablet (0.4 mg total) under the tongue every 5 (five) minutes as needed for chest pain. 25 tablet 3  . zolpidem (AMBIEN) 5 MG tablet TAKE 1 TABLET BY MOUTH AT BEDTIME AS NEEDED FOR SLEEP 30 tablet 0   No current facility-administered medications on file prior to visit.    BP 136/90 mmHg  Pulse 69  Temp(Src) 97.9 F (36.6 C) (Oral)  Ht 5\' 11"  (1.803 m)  Wt 191 lb (86.637 kg)  BMI 26.65 kg/m2  SpO2 97%       Objective:   Physical Exam  Constitutional: He is oriented to person, place, and time. He appears well-developed and well-nourished.  Cardiovascular: Normal rate and regular rhythm.   Pulmonary/Chest: Effort normal and breath sounds normal.  Neurological: He is alert and oriented to person, place, and time.  Skin: Skin is warm and dry.  Approximately 3 mm x 3 mm round area on index finger  with area of black dots that is slightly raised. Color is slightly lighter in appearance than surrounding skin      Assessment & Plan:  1. Verruca vulgaris Discussed with patient etiology of verruca vulgaris and treatment options that can be considered. Further discussed that liquid nitrogen can be used cautiously since area is located on the tip of index finger due to discomfort with treatment and possible neuropathy that can occur. Additional options of salicylic acid treatment can be continued as length of time of consistent therapy may not have been adequate to resolve the lesion. Further advised patient that additional liquid nitrogen therapy may be necessary if lesion improves but does not resolve fully. Patient agreed to liquid nitrogen therapy followed by home therapy of mediplast which contains salicylic acid [id number]. If lesion does not improve or resolve, patient will RTC for additional liquid nitrogen therapy or referral to  dermatology if lesion persists.   Instructions were provided regarding home care with mediplast and patient advised to follow up if treatment is improving lesion and further treatment of liquid nitrogen is needed. Patient voiced understanding and agreed with plan.  Delano Metz, FNP-C

## 2016-01-04 NOTE — Patient Instructions (Signed)
[facility])  can be used and if you need a referral to dermatology if needed.  Warts Warts are small growths on the skin. They are common and can occur on various areas of the body. A person may have one wart or multiple warts. Most warts are not painful, and they usually do not cause problems. However, warts can cause pain if they are large or occur in an area of the body where pressure will be applied to them, such as the bottom of the foot. In many cases, warts do not require treatment. They usually go away on their own over a period of many months to a couple years. Various treatments may be done for warts that cause problems or do not go away. Sometimes, warts go away and then come back again. CAUSES Warts are caused by a type of virus that is called human papillomavirus (HPV). This virus can spread from person to person through direct contact. Warts can also spread to other areas of the body when a person scratches a wart and then scratches another area of his or her body.  RISK FACTORS Warts are more likely to develop in:  People who are 78-74 years of age.  People who have a weakened body defense system (immune system). SYMPTOMS A wart may be round or oval or have an irregular shape. Most warts have a rough surface. Warts may range in color from skin color to light yellow, brown, or gray. They are generally less than  inch (1.3 cm) in size. Most warts are painless, but some can be painful when pressure is applied to them. DIAGNOSIS A wart can usually be diagnosed from its appearance. In some cases, a tissue sample may be removed (biopsy) to be looked at under a microscope. TREATMENT In many cases, warts do not need treatment. If treatment is needed, options may include:  Applying medicated solutions, creams, or patches to the wart. These may be over-the-counter or prescription medicines that make the skin soft so that layers will gradually shed away. In many cases, the medicine  is applied one or two times per day and covered with a bandage.  Putting duct tape over the top of the wart (occlusion). You will leave the tape in place for as long as told by your health care provider, then you will replace it with a new strip of tape. This is done until the wart goes away.  Freezing the wart with liquid nitrogen (cryotherapy).  Burning the wart with:  Laser treatment.  An electrified probe (electrocautery).  Injection of a medicine (Candida antigen) into the wart to help the body's immune system to fight off the wart.  Surgery to remove the wart. HOME CARE INSTRUCTIONS  Apply over-the-counter and prescription medicines only as told by your health care provider.  Do not apply over-the-counter wart medicines to your face or genitals before you ask your health care provider if it is okay to do so.  Do not scratch or pick at a wart.  Wash your hands after you touch a wart.  Avoid shaving hair that is over a wart.  Keep all follow-up visits as told by your health care provider. This is important. SEEK MEDICAL CARE IF:  Your warts do not improve after treatment.  You have redness, swelling, or pain at the site of a wart.  You have bleeding from a wart that does not stop with light pressure.  You have diabetes and you develop a wart.  This information is not intended to replace advice given to you by your health care provider. Make sure you discuss any questions you have with your health care provider.   Document Released: 04/17/2005 Document Revised: 03/29/2015 Document Reviewed: 10/03/2014 Elsevier Interactive Patient Education [employer].

## 2016-01-24 ENCOUNTER — Ambulatory Visit (INDEPENDENT_AMBULATORY_CARE_PROVIDER_SITE_OTHER): Payer: 59 | Admitting: Family Medicine

## 2016-01-24 VITALS — BP 148/92 | HR 70 | Temp 98.0°F | Ht 71.0 in | Wt 193.0 lb

## 2016-01-24 DIAGNOSIS — R5383 Other fatigue: Secondary | ICD-10-CM | POA: Diagnosis not present

## 2016-01-24 DIAGNOSIS — M609 Myositis, unspecified: Secondary | ICD-10-CM | POA: Diagnosis not present

## 2016-01-24 DIAGNOSIS — G4710 Hypersomnia, unspecified: Secondary | ICD-10-CM | POA: Diagnosis not present

## 2016-01-24 DIAGNOSIS — R0683 Snoring: Secondary | ICD-10-CM

## 2016-01-24 DIAGNOSIS — R42 Dizziness and giddiness: Secondary | ICD-10-CM | POA: Diagnosis not present

## 2016-01-24 DIAGNOSIS — M791 Myalgia: Secondary | ICD-10-CM

## 2016-01-24 DIAGNOSIS — IMO0001 Reserved for inherently not codable concepts without codable children: Secondary | ICD-10-CM

## 2016-01-24 DIAGNOSIS — R400 Somnolence: Secondary | ICD-10-CM

## 2016-01-24 NOTE — Progress Notes (Signed)
Pre visit review using our clinic review tool, if applicable. No additional management support is needed unless otherwise documented below in the visit note. 

## 2016-01-24 NOTE — Progress Notes (Signed)
Subjective:    Patient ID: Terry Hansen, male    DOB: [birth year], 60 y.o.   MRN: [id number]  HPI Patient seen for acute visit. He has had at least couple weeks of progressive fatigue and some general weakness and lightheadedness. Symptoms are somewhat intermittent. He's also noticed some early morning stiffness and mild muscle aches involving his neck, shoulders, upper back. No lower extremity symptoms. No clear trigger factors.  Patient had bypass about 3 years ago. Back in April he had echocardiogram with ejection fraction 60-65% and nuclear stress test with no acute ischemia and ejection fraction 41%. He's had recent atrial tachycardia and is currently on Toprol 100 mg twice daily. He has not had any symptoms of palpitations. No chest pains. No dyspnea. No recent appetite changes. Recent mild weight gain.  Has noticed some daytime somnolence progressive over recent weeks and months. His brother has noted that he snores. He lives alone. No observed apnea.  Does not usually feel very well rested each day when he gets up.  Past Medical History  Diagnosis Date  . Hypertension   . CAD (coronary artery disease)     a.  LHC 06/26/12:  pLAD 90%, prox/mid/dist CFS 20-30%, oRCA 100% (non dom), EF 55%;  b. Echo 12/13:  EF 55-60%, Gr 1 diast dysfn;  c. s/p CABG [facility]) 12/13: L-LAD/Dx    . HLD (hyperlipidemia)   . GERD (gastroesophageal reflux disease)   . Ocular migraine     "maybe once q couple months" (06/02/2014)  . Unstable angina pectoris [facility])    Past Surgical History  Procedure Laterality Date  . Nasal septum surgery      For uncontrolled epistaxis early 2013  . Coronary artery bypass graft  06/29/2012    Procedure: CORONARY ARTERY BYPASS GRAFTING (CABG);  Surgeon: Melrose Nakayama, MD;  Location: [place];  Service: Open Heart Surgery;  Laterality: N/A;  times two using Left Internal Mammary Artery  . Appendectomy  1970's  . Inguinal hernia repair Bilateral [phone]  .  Cardiac catheterization  06/2012  . Left heart catheterization with coronary angiogram N/A 06/26/2012    Procedure: LEFT HEART CATHETERIZATION WITH CORONARY ANGIOGRAM;  Surgeon: Josue Hector, MD;  Location: [facility] CATH LAB;  Service: Cardiovascular;  Laterality: N/A;  . Left heart catheterization with coronary/graft angiogram N/A 06/03/2014    Procedure: LEFT HEART CATHETERIZATION WITH Beatrix Fetters;  Surgeon: Jettie Booze, MD;  Location: [facility] CATH LAB;  Service: Cardiovascular;  Laterality: N/A;  . Colonoscopy  10-15 yrs ago    in [place]    reports that he has never smoked. He has never used smokeless tobacco. He reports that he drinks about 0.6 oz of alcohol per week. He reports that he does not use illicit drugs. family history includes Colon cancer (age of onset: 25) in his maternal grandfather; Heart attack in his brother; Heart disease in his brother, cousin, and paternal grandmother. No Known Allergies    Review of Systems  Constitutional: Positive for fatigue. Negative for fever, chills, appetite change and unexpected weight change.  Eyes: Negative for visual disturbance.  Respiratory: Negative for cough, chest tightness and shortness of breath.   Cardiovascular: Negative for chest pain, palpitations and leg swelling.  Gastrointestinal: Negative for abdominal pain.  Musculoskeletal: Positive for myalgias, neck pain and neck stiffness.  Neurological: Negative for dizziness, syncope, weakness, light-headedness and headaches.       Objective:   Physical Exam  Constitutional: He appears well-developed and well-nourished.  Neck: Neck supple. No thyromegaly present.  Cardiovascular: Normal rate and regular rhythm.   Pulmonary/Chest: Effort normal and breath sounds normal. No respiratory distress. He has no wheezes. He has no rales.  Musculoskeletal: He exhibits no edema.  Lymphadenopathy:    He has no cervical adenopathy.          Assessment & Plan:    Patient presents with nonspecific symptoms of fatigue, intermittent lightheadedness, and some muscle stiffness and soreness involving upper back neck and shoulders. Check sedimentation rate. Rule out PMR. May have some issues with obstructive sleep apnea. He has had progressive daytime somnolence. Epworth sleep scale score of 11 Consider sleep study. If sed rate up would consider low dose prednisone to see how this impacts his symptoms above.    Eulas Post MD [facility]

## 2016-01-24 NOTE — Patient Instructions (Signed)
Polymyalgia Rheumatica Polymyalgia rheumatica (also called PMR or polymyalgia) is a rheumatologic (arthritic) condition that causes pain and morning stiffness in your neck, shoulders, and hips. It is an inflammatory condition. In some people, inflammation of certain structures in the shoulder, hips, or other joints can be seen on special testing. It does not cause joint destruction, as occurs in other arthritic conditions. It usually occurs after 62 years of age, and is more common as you age. It can be confused with several other diseases, but it is usually easily treated. People with PMR often have, or can develop, a more severe rheumatologic condition called giant cell arteritis (also called CGA or temporal arteritis).  CAUSES  The exact cause of PMR is not known.   There are genetic factors involved.  Viruses have been suspected in the cause of PMR. This has not been proven. SYMPTOMS   Aching, pain, and morning stiffness your neck, both shoulders, or both hips.  Symptoms usually start slowly and build gradually.  Morning stiffness usually lasts at least 30 minutes.  Swelling and tenderness in other joints of the arms, hands, legs, and feet may occur.  Swelling and inflammation in the wrists can cause nerve inflammation at the wrist (carpal tunnel syndrome).  You may also have low grade fever, fatigue, weakness, decreased appetite and weight loss. DIAGNOSIS   Your caregiver may suspect that you have PMR based on your description of your symptoms and on your exam.  Your caregiver will examine you to be sure you do not have diseases that can be confused with PMR. These diseases include rheumatoid arthritis, fibromyalgia, or thyroid disease.  Your caregiver should check for signs of giant cell arteritis. This can cause serious complications such as blindness.  Lab tests can help confirm that you have PMR and not other diseases, but are sometimes inconclusive.  X-rays cannot show PMR.  However, it can identify other diseases like rheumatoid arthritis. Your caregiver may have you see a specialist in arthritis and inflammatory diseases (rheumatologist). TREATMENT  The goal of treatment is relief of symptoms. Treatment does not shorten the course of the illness or prevent complications. With proper treatment, you usually feel better almost right away.   The initial treatment of PMR is usually a cortisone (steroid) medication. Your caregiver will help determine a starting dose. The dose is gradually reduced every few weeks to months. Treatment usually lasts one to three years.  Other stronger medications are rarely needed. They will only be prescribed if your symptoms do not get better on cortisone medication alone, or if they recur as the dose is reduced.  Cortisone medication can have different side effects. With the doses of cortisone needed for PMR, the side effects can affect bones and joints, blood sugar control in diabetes, and mood changes. Discuss this with your caregiver.  Your caregiver will evaluate you regularly during your treatment. They will do this in order to assess progress and to check for complications of the illness or treatment.  Physical therapy is sometimes useful. This is especially true if your joints are still stiff after other symptoms have improved. HOME CARE INSTRUCTIONS   Follow your caregiver's instructions. Do not change your dose of cortisone medication on your own.  Keep your appointments for follow-up lab tests and caregiver visits. Your lab tests need to be monitored. You must get checked periodically for giant cell arteritis.  Follow your caregiver's guidance regarding physical activity (usually no restrictions are needed) or physical therapy.  Your caregiver  may have instructions to prevent or check for side effects from cortisone medication (including bone density testing or treatment). Follow their instructions carefully. SEEK MEDICAL  CARE IF:   You develop any side effects from treatment. Side effects can include:  Elevated blood pressure.  High blood sugar (or worsening of diabetes, if you are diabetic).  Difficulty fighting off infections.  Weight gain.  Weakness of the bones (osteoporosis).  Your aches, pains, morning stiffness, or other symptoms get worse with time. This is especially true after your dose of cortisone is reduced.  You develop new joint symptoms (pain, swelling, etc.) SEEK IMMEDIATE MEDICAL CARE IF:   You develop a severe headache.  You start vomiting.  You have problems with your vision.  You have an oral temperature above 102 F (38.9 C), not controlled by medicine.   This information is not intended to replace advice given to you by your health care provider. Make sure you discuss any questions you have with your health care provider.   Document Released: 08/15/2004 Document Revised: 06/24/2012 Document Reviewed: 01/18/2015 Elsevier Interactive Patient Education [employer].

## 2016-01-25 LAB — SEDIMENTATION RATE: SED RATE: 6 mm/h (ref 0–20)

## 2016-02-05 ENCOUNTER — Encounter: Payer: Self-pay | Admitting: Cardiovascular Disease

## 2016-02-13 ENCOUNTER — Other Ambulatory Visit: Payer: Self-pay | Admitting: Internal Medicine

## 2016-04-09 ENCOUNTER — Encounter: Payer: Self-pay | Admitting: Cardiovascular Disease

## 2016-04-16 NOTE — Progress Notes (Signed)
Patient ID: Terry Hansen, male   DOB: [birth year], 60 y.o.   MRN: [id number]             HPI: This is a 60 y.o.  male patient who has history of coronary artery disease status post CABG in 2013 with a LIMA to the LAD and diagonal. Last cardiac catheterization on 06/03/14. Previously placed bypass grafts were patent and there was no other critical disease. Medical therapy was recommended for possible small vessel disease. There was also concern for possible GI etiology of his pain and was started on Protonix. Patient also has a history of hypertension and hyperlipidemia.    12/15 beta blocker stopped due to libido and fatigue issues and lisinopril increased   However he restarted bystolic when BP elevated   Cardiac Cath: 06/03/2014 Reviewed on CV Image   ANGIOGRAPHIC DATA:   The left main coronary artery is absent. There are separate ostia for the LAD and circumflex. The left anterior descending artery is patent proximally. After a small diagonal branch, there is an 80% stenosis. There is mild disease in the mid LAD. The insertion of the LIMA is well seen. There is competitive flow in the distal LAD. The left circumflex artery is a large vessel. The first obtuse marginal is large and widely patent. In the mid circumflex, there is a 30-40% lesion at the origin of an atrial branch, which was present on prior angiogram.  The OM 3 and OM for are medium size and widely patent. The right coronary artery is a small, nondominant vessel. There is mild disease in this vessel. The LIMA to LAD and diagonal is widely patent. LEFT VENTRICULOGRAM:  Left ventricular angiogram was done in the 30 RAO projection and revealed normal left ventricular wall motion and systolic function with an estimated ejection fraction of 55%.  LVEDP was 6 mmHg. IMPRESSIONS:   1. Absent left main coronary artery. 2. 80% mid left anterior descending artery stenosis.  Patent LIMA to LAD/Diagonal.   3. Mild disease left circumflex artery  and its branches. 4. Patent nondominant right coronary artery. 5. Normal left ventricular systolic function.  LVEDP 6 mmHg.  Ejection fraction 55%. RECOMMENDATION:  Continue medical therapy.  Cardiology follow-up with Dr. Johnsie Cancel.  Per the progress note from today, consider GI w/u.  March/April 2017 palpitations and noted to have atrial tachycardia ? PV source Seen by Dr Curt Bears And started on multaq.    Myovue:  11/02/15 non ischemic   Nuclear stress EF: 41%.  There was no ST segment deviation noted during stress.  Defect 1: There is a large defect of moderate severity present in the mid anterior, mid inferolateral, apical anterior, apical lateral and apex location.  Findings consistent with prior anteriolateral myocardial infarction. There is no evidence of ischemia.  This is an intermediate risk study.   Concern that echo in March showed normal EF MPI images due suggest anterior MI He thinks its related to the tachycardia he was having in Spring Never started Multaq After approved as his palpitations improved   No Known Allergies   Current Outpatient Prescriptions  Medication Sig Dispense Refill  . aspirin EC 81 MG tablet Take 1 tablet (81 mg total) by mouth daily.    Marland Kitchen atorvastatin (LIPITOR) 40 MG tablet TAKE 1 TABLET BY MOUTH EVERY DAY AT 6 PM 90 tablet 0  . Coenzyme Q10 200 MG capsule Take 200 mg by mouth every morning. disontinue while in hospital    . ibuprofen (ADVIL,MOTRIN) 200 MG  tablet Take 600 mg by mouth every 6 (six) hours as needed. migraine headache    . metoprolol succinate (TOPROL-XL) 100 MG 24 hr tablet Take 1 tablet (100 mg total) by mouth 2 (two) times daily. Take with or immediately following a meal. 180 tablet 3  . Multiple Vitamin (MULTIVITAMIN) tablet Take 1 tablet by mouth daily.    . nitroGLYCERIN (NITROSTAT) 0.4 MG SL tablet Place 1 tablet (0.4 mg total) under the tongue every 5 (five) minutes as needed for chest pain. 25 tablet 3  . zolpidem (AMBIEN)  5 MG tablet TAKE 1 TABLET BY MOUTH AT BEDTIME AS NEEDED FOR SLEEP 30 tablet 0   No current facility-administered medications for this visit.     Past Medical History:  Diagnosis Date  . CAD (coronary artery disease)    a.  LHC 06/26/12:  pLAD 90%, prox/mid/dist CFS 20-30%, oRCA 100% (non dom), EF 55%;  b. Echo 12/13:  EF 55-60%, Gr 1 diast dysfn;  c. s/p CABG [facility] Hockey) 12/13: L-LAD/Dx    . GERD (gastroesophageal reflux disease)   . HLD (hyperlipidemia)   . Hypertension   . Ocular migraine    "maybe once q couple months" (06/02/2014)  . Unstable angina pectoris [facility])     Past Surgical History:  Procedure Laterality Date  . APPENDECTOMY  1970's  . CARDIAC CATHETERIZATION  06/2012  . COLONOSCOPY  10-15 yrs ago   in [place]  . CORONARY ARTERY BYPASS GRAFT  06/29/2012   Procedure: CORONARY ARTERY BYPASS GRAFTING (CABG);  Surgeon: Melrose Nakayama, MD;  Location: [place];  Service: Open Heart Surgery;  Laterality: N/A;  times two using Left Internal Mammary Artery  . INGUINAL HERNIA REPAIR Bilateral [phone]  . LEFT HEART CATHETERIZATION WITH CORONARY ANGIOGRAM N/A 06/26/2012   Procedure: LEFT HEART CATHETERIZATION WITH CORONARY ANGIOGRAM;  Surgeon: Josue Hector, MD;  Location: [facility] CATH LAB;  Service: Cardiovascular;  Laterality: N/A;  . LEFT HEART CATHETERIZATION WITH CORONARY/GRAFT ANGIOGRAM N/A 06/03/2014   Procedure: LEFT HEART CATHETERIZATION WITH Beatrix Fetters;  Surgeon: Jettie Booze, MD;  Location: [facility] CATH LAB;  Service: Cardiovascular;  Laterality: N/A;  . NASAL SEPTUM SURGERY     For uncontrolled epistaxis early 2013    Family History  Problem Relation Age of Onset  . Heart disease Brother     CABG age 6, redo bypass ~50  . Heart disease Paternal Grandmother   . Heart disease Cousin     Maternal side  . Heart attack Brother   . Colon cancer Maternal Grandfather 78    Social History   Social History  . Marital status: Divorced    Spouse  name: N/A  . Number of children: N/A  . Years of education: N/A   Occupational History  . Not on file.   Social History Main Topics  . Smoking status: Never Smoker  . Smokeless tobacco: Never Used  . Alcohol use 0.6 oz/week    1 Glasses of wine per week  . Drug use: No  . Sexual activity: Not Currently   Other Topics Concern  . Not on file   Social History Narrative   Divorced 7 years ago   3 children   Works in Aeronautical engineer    ROS:  insomnia and anxiety mild GERD all other symptoms reviewed and negative   Vitals:   04/22/16 0921  BP: 130/80  Pulse: 62     Affect appropriate Healthy:  appears stated age HEENT: normal Neck supple  with no adenopathy JVP normal no bruits no thyromegaly Lungs clear with no wheezing and good diaphragmatic motion Heart:  S1/S2 no murmur, no rub, gallop or click PMI normal Abdomen: benighn, BS positve, no tenderness, no AAA no bruit.  No HSM or HJR Distal pulses intact with no bruits No edema Neuro non-focal Skin warm and dry No muscular weakness    Wt Readings from Last 3 Encounters:  04/22/16 85.7 kg (189 lb)  01/24/16 87.5 kg (193 lb)  01/04/16 86.6 kg (191 lb)    Lab Results  Component Value Date   WBC 9.0 10/30/2015   HGB 16.3 10/30/2015   HCT 46.4 10/30/2015   PLT 239 10/30/2015   GLUCOSE 99 10/30/2015   CHOL 106 08/17/2015   TRIG 52.0 08/17/2015   HDL 46.10 08/17/2015   LDLCALC 50 08/17/2015   ALT 31 10/19/2015   AST 22 10/19/2015   NA 139 10/30/2015   K 4.1 10/30/2015   CL 105 10/30/2015   CREATININE 1.02 10/30/2015   BUN 11 10/30/2015   CO2 24 10/30/2015   TSH 1.100 10/19/2015   PSA 0.78 01/31/2014   INR 1.05 10/19/2015   HGBA1C 5.6 06/25/2012    EKG: Normal sinus rhythm, no acute change  09/29/14     Impression:  CAD/CABG: LIMA LAD/D1 patent cath 2015 myovue 10/2015 anterior MI no ischemia EF 41% But echo in March showed normal EF.  Will order MRI to quantitate EF and see if anterior  wall Down  May need to start ACE  ATrial Tachycardia: improved not on Multaq for now  HTN:  Well controlled.  Continue current medications and low sodium Dash type diet.    Cholesterol:  Will check labs today  Lab Results  Component Value Date   CHOL 106 08/17/2015   HDL 46.10 08/17/2015   LDLCALC 50 08/17/2015   TRIG 52.0 08/17/2015   CHOLHDL 2 08/17/2015    Jenkins Rouge

## 2016-04-19 ENCOUNTER — Ambulatory Visit: Payer: 59 | Admitting: Cardiovascular Disease

## 2016-04-22 ENCOUNTER — Ambulatory Visit (INDEPENDENT_AMBULATORY_CARE_PROVIDER_SITE_OTHER): Payer: BLUE CROSS/BLUE SHIELD | Admitting: Cardiovascular Disease

## 2016-04-22 ENCOUNTER — Encounter: Payer: Self-pay | Admitting: Cardiovascular Disease

## 2016-04-22 VITALS — BP 130/80 | HR 62 | Ht 71.0 in | Wt 189.0 lb

## 2016-04-22 DIAGNOSIS — I2510 Atherosclerotic heart disease of native coronary artery without angina pectoris: Secondary | ICD-10-CM

## 2016-04-22 DIAGNOSIS — I2583 Coronary atherosclerosis due to lipid rich plaque: Secondary | ICD-10-CM | POA: Diagnosis not present

## 2016-04-22 LAB — BASIC METABOLIC PANEL
BUN: 11 mg/dL (ref 7–25)
CALCIUM: 9.3 mg/dL (ref 8.6–10.3)
CO2: 27 mmol/L (ref 20–31)
Chloride: 101 mmol/L (ref 98–110)
Creat: 0.87 mg/dL (ref 0.70–1.25)
Glucose, Bld: 85 mg/dL (ref 65–99)
Potassium: 4.3 mmol/L (ref 3.5–5.3)
SODIUM: 138 mmol/L (ref 135–146)

## 2016-04-22 LAB — HEPATIC FUNCTION PANEL
ALBUMIN: 4.2 g/dL (ref 3.6–5.1)
ALT: 25 U/L (ref 9–46)
AST: 20 U/L (ref 10–35)
Alkaline Phosphatase: 90 U/L (ref 40–115)
BILIRUBIN INDIRECT: 0.7 mg/dL (ref 0.2–1.2)
Bilirubin, Direct: 0.2 mg/dL (ref <=0.2)
TOTAL PROTEIN: 6.5 g/dL (ref 6.1–8.1)
Total Bilirubin: 0.9 mg/dL (ref 0.2–1.2)

## 2016-04-22 LAB — LIPID PANEL
CHOL/HDL RATIO: 2.5 ratio (ref <=5.0)
CHOLESTEROL: 115 mg/dL — AB (ref 125–200)
HDL: 46 mg/dL (ref >=40)
LDL CALC: 56 mg/dL (ref <130)
TRIGLYCERIDES: 65 mg/dL (ref <150)
VLDL: 13 mg/dL (ref <30)

## 2016-04-22 NOTE — Patient Instructions (Addendum)
Medication Instructions:  Your physician recommends that you continue on your current medications as directed. Please refer to the Current Medication list given to you today.  Labwork: Your physician recommends that you lab work today Lipid, Liver, and BMET  Testing/Procedures: Your physician has requested that you have a cardiac MRI. Cardiac MRI uses a computer to create images of your heart as its beating, producing both still and moving pictures of your heart and major blood vessels. For further information please visit [email or web address] Please follow the instruction sheet given to you today for more information.  Follow-Up: Your physician wants you to follow-up in: 6 months with Dr. Johnsie Cancel. You will receive a reminder letter in the mail two months in advance. If you don't receive a letter, please call our office to schedule the follow-up appointment.   If you need a refill on your cardiac medications before your next appointment, please call your pharmacy.

## 2016-05-06 ENCOUNTER — Ambulatory Visit (HOSPITAL_COMMUNITY): Admission: RE | Admit: 2016-05-06 | Payer: BLUE CROSS/BLUE SHIELD | Source: Ambulatory Visit

## 2016-05-07 ENCOUNTER — Ambulatory Visit (HOSPITAL_COMMUNITY): Payer: BLUE CROSS/BLUE SHIELD

## 2016-05-08 DIAGNOSIS — M7711 Lateral epicondylitis, right elbow: Secondary | ICD-10-CM | POA: Diagnosis not present

## 2016-05-15 ENCOUNTER — Telehealth: Payer: Self-pay | Admitting: Family Medicine

## 2016-05-15 MED ORDER — ATORVASTATIN CALCIUM 40 MG PO TABS: ORAL_TABLET | ORAL | 0 refills | Status: DC

## 2016-05-15 NOTE — Telephone Encounter (Signed)
Pt said his new pcp is julia and he needs a refill on atorvastatin 40 #90 w/refills [facility]. Pt needs med today

## 2016-05-15 NOTE — Telephone Encounter (Signed)
Patient was called and notified of his prescription refill for his Atorvastatin 40mg  which was send to [place].

## 2016-08-11 ENCOUNTER — Other Ambulatory Visit: Payer: Self-pay | Admitting: Family Medicine

## 2016-11-08 ENCOUNTER — Other Ambulatory Visit: Payer: Self-pay | Admitting: Cardiology

## 2017-01-01 ENCOUNTER — Telehealth: Payer: Self-pay

## 2017-01-01 NOTE — Telephone Encounter (Signed)
Patient called to report that this past weekend he was working outside and thinks he got overheated. He was sweating profusely and became very red in the face. He states that he then stopped sweating and "felt very bad". He did go inside and reports that "it took him a long time to cool down". He states that he is still very weak, having a hard time doing his normal activities. Advised pt he needs to be seen and evaluated. He would like to see Gregary Signs tomorrow. Appt scheduled with Gregary Signs for 9:15am, pt advised on when to seek ED/UC evaluation. Pt voiced understanding. Nothing further needed at this time.   [place] Thanks!

## 2017-01-02 ENCOUNTER — Ambulatory Visit (INDEPENDENT_AMBULATORY_CARE_PROVIDER_SITE_OTHER): Payer: BLUE CROSS/BLUE SHIELD | Admitting: Family Medicine

## 2017-01-02 ENCOUNTER — Encounter: Payer: Self-pay | Admitting: Family Medicine

## 2017-01-02 VITALS — BP 146/92 | HR 62 | Temp 97.8°F | Wt 194.4 lb

## 2017-01-02 DIAGNOSIS — F329 Major depressive disorder, single episode, unspecified: Secondary | ICD-10-CM

## 2017-01-02 DIAGNOSIS — R5381 Other malaise: Secondary | ICD-10-CM

## 2017-01-02 DIAGNOSIS — F419 Anxiety disorder, unspecified: Secondary | ICD-10-CM | POA: Diagnosis not present

## 2017-01-02 DIAGNOSIS — T679XXA Effect of heat and light, unspecified, initial encounter: Secondary | ICD-10-CM | POA: Diagnosis not present

## 2017-01-02 DIAGNOSIS — F32A Depression, unspecified: Secondary | ICD-10-CM

## 2017-01-02 LAB — BASIC METABOLIC PANEL
BUN: 14 mg/dL (ref 6–23)
CALCIUM: 9.4 mg/dL (ref 8.4–10.5)
CHLORIDE: 105 meq/L (ref 96–112)
CO2: 29 meq/L (ref 19–32)
Creatinine, Ser: 0.97 mg/dL (ref 0.40–1.50)
GFR: 82.99 mL/min (ref >60.00)
GLUCOSE: 88 mg/dL (ref 70–99)
Potassium: 4.2 mEq/L (ref 3.5–5.1)
Sodium: 139 mEq/L (ref 135–145)

## 2017-01-02 LAB — CBC WITH DIFFERENTIAL/PLATELET
BASOS ABS: 0 10*3/uL (ref 0.0–0.1)
Basophils Relative: 0.3 % (ref 0.0–3.0)
EOS PCT: 1.8 % (ref 0.0–5.0)
Eosinophils Absolute: 0.1 10*3/uL (ref 0.0–0.7)
HCT: 45.3 % (ref 39.0–52.0)
HEMOGLOBIN: 15.7 g/dL (ref 13.0–17.0)
Lymphocytes Relative: 28 % (ref 12.0–46.0)
Lymphs Abs: 1.6 10*3/uL (ref 0.7–4.0)
MCHC: 34.7 g/dL (ref 30.0–36.0)
MCV: 85.9 fl (ref 78.0–100.0)
MONO ABS: 0.4 10*3/uL (ref 0.1–1.0)
MONOS PCT: 7.5 % (ref 3.0–12.0)
Neutro Abs: 3.6 10*3/uL (ref 1.4–7.7)
Neutrophils Relative %: 62.4 % (ref 43.0–77.0)
Platelets: 225 10*3/uL (ref 150.0–400.0)
RBC: 5.27 Mil/uL (ref 4.22–5.81)
RDW: 13.1 % (ref 11.5–15.5)
WBC: 5.8 10*3/uL (ref 4.0–10.5)

## 2017-01-02 NOTE — Patient Instructions (Signed)
It was a pleasure to see you today!  Please schedule a physical at your convenience.  We have ordered labs or studies at this visit. It can take up to 1-2 weeks for results and processing. IF results require follow up or explanation, we will call you with instructions. Clinically stable results will be released to your [facility]. If you have not heard from [place] or cannot find your results in [facility] in 2 weeks please contact our office at [phone].  If you are not yet signed up for [facility], please consider signing up    [place]? Heat exhaustion happens when your body gets overheated from hot weather or from exercise. Heat exhaustion can lead to heat stroke, a life-threatening condition that requires emergency care. Heat exhaustion is more likely to develop when:  You are exercising or being active.  You are in hot or humid weather.  You are in bright sunshine.  You are not drinking enough water.  WHO IS AT RISK FOR THIS CONDITION? This condition is more likely to develop in:  People who exercise in hot or humid weather.  People who exercise beyond their fitness level.  People who wear clothing that does not allow sweat to evaporate.  People who are dehydrated.  People who drink a lot of alcoholic beverages or beverages that have caffeine. This can lead to dehydration.  People who are age 11 or older.  Children.  People who have a medical condition such as heart disease, poor circulation, sickle cell disease, or high blood pressure.  People who have a fever.  People who are very overweight (obese).  WHAT ARE THE SYMPTOMS OF THIS CONDITION? Symptoms of heat exhaustion include:  Heavy sweating along with feeling weak, dizzy, light-headed, and nauseous.  Rapid heartbeat.  Headache.  Urine that is darker than normal.  Muscle cramps, such as in the leg or side (flank).  Moist, cool, and clammy  skin.  Fatigue.  Thirst.  Confusion.  Fainting.  WHAT SHOULD I DO IF I THINK I HAVE THIS CONDITION? If you think that you have heat exhaustion, call your health care provider. Follow his or her instructions. You should also:  Call a friend or a family member and ask him or her to stay with you.  Move to a cooler location, such as: ? Into the shade. ? In front of a fan. ? An air-conditioned space.  Lie down and rest.  Slowly drink nonalcoholic, caffeine-free fluids.  Take off tight clothing or extra clothing.  Take a cool bath or shower, if possible. If you do not have access to a bath or shower, dab or mist cool water on your skin.  WHY IS IT IMPORTANT TO TREAT THIS CONDITION? It is important to take care of yourself and treat heat exhaustion as soon as possible. Untreated heat exhaustion can turn into heat stroke, which is a life-threatening condition that requires urgent medical treatment. HOW CAN I PREVENT THIS CONDITION? To prevent this condition:  Drink enough fluid to keep your urine clear or pale yellow. This helps your body to sweat properly.  Avoid outdoor activities on very hot or humid days.  Do not exercise or do other physical activity when you are not feeling well.  Take breaks often during physical activity.  Wear light-colored, loose-fitting, and lightweight clothing when it is hot outside.  Wear a hat and use sunscreen when exercising outdoors.  Avoid being outside during the hottest times of the day.  Check with your health care provider before you start any new activity, especially if you take medicine or have a medical condition.  Start any new activity slowly and work up to your fitness level.  HOW CAN I HELP TO PROTECT ELDERLY RELATIVES AND NEIGHBORS FROM THIS CONDITION? People who are age 90 or older are at greater risk for heat exhaustion. Their bodies have a harder time adjusting to heat. They are also more likely to have a medical  condition or be on medicines that increase their risk for heat exhaustion. They may get heat exhaustion indoors if the heat is high for several days. You can help to protect them during hot weather by:  Checking on them two or more times each day.  Making sure that they are drinking plenty of cool, nonalcoholic, and caffeine-free fluids.  Making sure that they use their air conditioner.  Taking them to a location where air conditioning is available.  Talking with their health care provider about their medical needs, medicines, and fluid requirements.  SEEK MEDICAL CARE IF:  Your symptoms last longer than 30 minutes.  SEEK IMMEDIATE MEDICAL CARE IF:  You have any symptoms of heat stroke. These include: ? Fever. ? Vomiting. ? Red skin. ? Inability to sweat, resulting in hot, dry skin. ? Excessive thirst. ? Rapid breathing. ? Headache. ? Confusion or disorientation. ? Fainting. ? Seizures. These symptoms may represent a serious problem that is an emergency. Do not wait to see if the symptoms will go away. Get medical help right away. Call your local emergency services (911 in the [place] Do not drive yourself to the hospital. This information is not intended to replace advice given to you by your health care provider. Make sure you discuss any questions you have with your health care provider. Document Released: 04/16/2008 Document Revised: 01/26/2016 Document Reviewed: 10/29/2015 Elsevier Interactive Patient Education  [employer].

## 2017-01-02 NOTE — Progress Notes (Signed)
Subjective:    Patient ID: Terry Hansen, male    DOB: 1955/09/02, 61 y.o.   MRN: [id number]  HPI  Terry Hansen is a 61 year old male who presents today for evaluation of feeling malaise and headache that started following exposure to heat 5 days ago. He reports feeling better however he has not completely improved. Associated generalized muscle aches occurred after this exposure. He also experienced a headache one day following exposure to heat which has improved. He reports working in the yard 5 days ago in the heat for approximately 2 hours just prior to symptoms Today, he denies fever, chills, sweats, N/V/D, weakness, syncope, lightheadedness, or abdominal cramps.  He is followed by cardiology for coronary artery disease status post CABG 2013. He denies chest pain, palpitations, SOB, numbness, tingling, weakness, headaches, or edema. He is due for a follow up with cardiology.  BP is mildly elevated however he reports just taking his medication a short time ago. He reports average systolic readings in the 637C and diastolic readings in the 58I. He follows a DASH diet.   Depression and anxiety:  He reports that this has been ongoing and his is here to discuss options for treatment. He states that he is not interested in initiating a medication at this visit but wants to open up this conversation.  He states that anxiety has been an issue which he has particularly noted following his CABG in 2013. He lives alone and states that he does not have a significant other to talk to in the evenings. He denies suicidal/homicidal ideation. Contributes stress at work to increase in symptoms. He has not tried a medication before.  He is due for routine care and lab work and plans to schedule this soon.  Review of Systems  Constitutional: Positive for fatigue. Negative for chills and fever.  Respiratory: Negative for cough, shortness of breath and wheezing.   Cardiovascular: Negative for chest pain and  palpitations.  Gastrointestinal: Negative for abdominal pain, constipation, diarrhea, nausea and vomiting.  Genitourinary: Negative for dysuria.  Musculoskeletal: Negative for myalgias.       Generalized mild muscle aches  Skin: Negative for rash.  Neurological: Negative for dizziness, weakness, light-headedness and headaches.  Psychiatric/Behavioral: Negative for suicidal ideas.       Depressed mood   Past Medical History:  Diagnosis Date  . CAD (coronary artery disease)    a.  LHC 06/26/12:  pLAD 90%, prox/mid/dist CFS 20-30%, oRCA 100% (non dom), EF 55%;  b. Echo 12/13:  EF 55-60%, Gr 1 diast dysfn;  c. s/p CABG [facility]oxan Hockey) 12/13: L-LAD/Dx    . GERD (gastroesophageal reflux disease)   . HLD (hyperlipidemia)   . Hypertension   . Ocular migraine    "maybe once q couple months" (06/02/2014)  . Unstable angina pectoris [facility])      Social History   Social History  . Marital status: Divorced    Spouse name: N/A  . Number of children: N/A  . Years of education: N/A   Occupational History  . Not on file.   Social History Main Topics  . Smoking status: Never Smoker  . Smokeless tobacco: Never Used  . Alcohol use 0.6 oz/week    1 Glasses of wine per week  . Drug use: No  . Sexual activity: Not Currently   Other Topics Concern  . Not on file   Social History Narrative   Divorced 7 years ago   3 children   Works in Scientist, research (life sciences)  estate management    Past Surgical History:  Procedure Laterality Date  . APPENDECTOMY  1970's  . CARDIAC CATHETERIZATION  06/2012  . COLONOSCOPY  10-15 yrs ago   in [place]  . CORONARY ARTERY BYPASS GRAFT  06/29/2012   Procedure: CORONARY ARTERY BYPASS GRAFTING (CABG);  Surgeon: Melrose Nakayama, MD;  Location: [place];  Service: Open Heart Surgery;  Laterality: N/A;  times two using Left Internal Mammary Artery  . INGUINAL HERNIA REPAIR Bilateral [phone]  . LEFT HEART CATHETERIZATION WITH CORONARY ANGIOGRAM N/A 06/26/2012   Procedure: LEFT  HEART CATHETERIZATION WITH CORONARY ANGIOGRAM;  Surgeon: Josue Hector, MD;  Location: [facility] CATH LAB;  Service: Cardiovascular;  Laterality: N/A;  . LEFT HEART CATHETERIZATION WITH CORONARY/GRAFT ANGIOGRAM N/A 06/03/2014   Procedure: LEFT HEART CATHETERIZATION WITH Beatrix Fetters;  Surgeon: Jettie Booze, MD;  [facility] CATH LAB;  Service: Cardiovascular;  Laterality: N/A;  . NASAL SEPTUM SURGERY     For uncontrolled epistaxis early 2013    Family History  Problem Relation Age of Onset  . Heart disease Brother        CABG age 43, redo bypass ~50  . Heart disease Paternal Grandmother   . Heart disease Cousin        Maternal side  . Heart attack Brother   . Colon cancer Maternal Grandfather 60    No Known Allergies  Current Outpatient Prescriptions on File Prior to Visit  Medication Sig Dispense Refill  . aspirin EC 81 MG tablet Take 1 tablet (81 mg total) by mouth daily.    Marland Kitchen atorvastatin (LIPITOR) 40 MG tablet TAKE 1 TABLET BY MOUTH EVERY DAY AT 6 PM 90 tablet 1  . Coenzyme Q10 200 MG capsule Take 200 mg by mouth every morning. disontinue while in hospital    . ibuprofen (ADVIL,MOTRIN) 200 MG tablet Take 600 mg by mouth every 6 (six) hours as needed. migraine headache    . metoprolol succinate (TOPROL-XL) 100 MG 24 hr tablet TAKE 1 TABLET BY MOUTH TWICE DAILY WITH OR IMMEDIATELY FOLLOWING A MEAL. 180 tablet 1  . nitroGLYCERIN (NITROSTAT) 0.4 MG SL tablet Place 1 tablet (0.4 mg total) under the tongue every 5 (five) minutes as needed for chest pain. 25 tablet 3  . zolpidem (AMBIEN) 5 MG tablet TAKE 1 TABLET BY MOUTH AT BEDTIME AS NEEDED FOR SLEEP 30 tablet 0  . Multiple Vitamin (MULTIVITAMIN) tablet Take 1 tablet by mouth daily.     No current facility-administered medications on file prior to visit.     BP (!) 146/92 (BP Location: Left Arm, Patient Position: Sitting, Cuff Size: Normal)   Pulse 62   Temp 97.8 F (36.6 C) (Oral)   Wt 194 lb 6.4 oz (88.2 kg)    SpO2 97%   BMI 27.11 kg/m       Objective:   Physical Exam  Constitutional: He is oriented to person, place, and time. He appears well-developed and well-nourished.  HENT:  Mouth/Throat: Oropharynx is clear and moist and mucous membranes are normal.  Eyes: Pupils are equal, round, and reactive to light. No scleral icterus.  Neck: Neck supple.  Cardiovascular: Normal rate, regular rhythm and intact distal pulses.   Pulmonary/Chest: Effort normal and breath sounds normal. He has no wheezes. He has no rales.  Abdominal: Soft. Bowel sounds are normal. There is no tenderness.  Musculoskeletal: He exhibits no edema.  Lymphadenopathy:    He has no cervical adenopathy.  Neurological: He is alert and  oriented to person, place, and time.  Skin: Skin is warm and dry. No rash noted.  Psychiatric: He has a normal mood and affect. His behavior is normal. Judgment and thought content normal.       Assessment & Plan:  1. Heat exposure, initial encounter Improved; patient drinking water and urinating appropriately; will check BMP today; reviewed the importance of avoiding work outside at the hottest part of the day; reviewed his increased risks due to medications that he is taking; will follow up for physical and establish care. Written information for heat exhaustion provided.  2. Anxiety and depression Mild; PHQ-4;  he will consider treatment with medication and counseling and follow up for physical. Also, we discussed the importance of lab work that should be completed prior to initiation of treatment.   3. Malaise Improving; suspect this is related to heat exposure that he experienced; reviewed signs/symptoms of heat exhaustion and preventive methods when going outside; will check BMP today and advised follow up for physical.  Recommended that patient return for physical and lab work to determine management and care. Also advised him that he is due to follow up with cardiology and he will  schedule an appointment. Return precautions advised.  Delano Metz, FNP-C

## 2017-01-27 ENCOUNTER — Encounter: Payer: Self-pay | Admitting: Family Medicine

## 2017-01-27 ENCOUNTER — Ambulatory Visit (INDEPENDENT_AMBULATORY_CARE_PROVIDER_SITE_OTHER): Payer: BLUE CROSS/BLUE SHIELD | Admitting: Family Medicine

## 2017-01-27 VITALS — BP 148/88 | HR 58 | Temp 97.7°F | Ht 71.0 in | Wt 193.0 lb

## 2017-01-27 DIAGNOSIS — L209 Atopic dermatitis, unspecified: Secondary | ICD-10-CM | POA: Diagnosis not present

## 2017-01-27 DIAGNOSIS — I10 Essential (primary) hypertension: Secondary | ICD-10-CM

## 2017-01-27 DIAGNOSIS — Z13220 Encounter for screening for lipoid disorders: Secondary | ICD-10-CM

## 2017-01-27 DIAGNOSIS — Z1159 Encounter for screening for other viral diseases: Secondary | ICD-10-CM | POA: Diagnosis not present

## 2017-01-27 DIAGNOSIS — F419 Anxiety disorder, unspecified: Secondary | ICD-10-CM

## 2017-01-27 DIAGNOSIS — Z114 Encounter for screening for human immunodeficiency virus [HIV]: Secondary | ICD-10-CM

## 2017-01-27 DIAGNOSIS — Z1211 Encounter for screening for malignant neoplasm of colon: Secondary | ICD-10-CM

## 2017-01-27 DIAGNOSIS — F32A Depression, unspecified: Secondary | ICD-10-CM

## 2017-01-27 DIAGNOSIS — F329 Major depressive disorder, single episode, unspecified: Secondary | ICD-10-CM | POA: Diagnosis not present

## 2017-01-27 DIAGNOSIS — Z0001 Encounter for general adult medical examination with abnormal findings: Secondary | ICD-10-CM

## 2017-01-27 DIAGNOSIS — Z125 Encounter for screening for malignant neoplasm of prostate: Secondary | ICD-10-CM

## 2017-01-27 LAB — LIPID PANEL
CHOL/HDL RATIO: 3
CHOLESTEROL: 121 mg/dL (ref 0–200)
HDL: 45 mg/dL (ref >39.00)
LDL Cholesterol: 63 mg/dL (ref 0–99)
NonHDL: 76.17
TRIGLYCERIDES: 65 mg/dL (ref 0.0–149.0)
VLDL: 13 mg/dL (ref 0.0–40.0)

## 2017-01-27 LAB — HEPATIC FUNCTION PANEL
ALT: 33 U/L (ref 0–53)
AST: 23 U/L (ref 0–37)
Albumin: 4 g/dL (ref 3.5–5.2)
Alkaline Phosphatase: 101 U/L (ref 39–117)
BILIRUBIN DIRECT: 0.2 mg/dL (ref 0.0–0.3)
TOTAL PROTEIN: 6.5 g/dL (ref 6.0–8.3)
Total Bilirubin: 0.9 mg/dL (ref 0.2–1.2)

## 2017-01-27 LAB — TSH: TSH: 1.88 u[IU]/mL (ref 0.35–4.50)

## 2017-01-27 LAB — PSA: PSA: 1.03 ng/mL (ref 0.10–4.00)

## 2017-01-27 MED ORDER — TRIAMCINOLONE ACETONIDE 0.025 % EX OINT: 1.0000 "application " | TOPICAL_OINTMENT | Freq: Two times a day (BID) | CUTANEOUS | 0 refills | Status: DC

## 2017-01-27 NOTE — Progress Notes (Signed)
Subjective:    Patient ID: Terry Hansen, male    DOB: 05-23-1956, 61 y.o.   MRN: [id number]  HPI  Mr. Keltner is a 61 year old male who is here today for routine physical examination. He is also here to discuss depression and anxiety that has been ongoing and also a rash that is on his buttocks.  He states symptoms of depression/anxiety occurred as early as 2013 following his CABG. He lives alone and reports that not having a significant other to talk to in the evenings exacerbates these symptoms. He contributes worsening symptoms when stressors are noted at work. Today, he reports feeling "much better" and that he has initiated increased activity by remodeling his home which has improved his symptoms. Also, work stressors have decreased which has provided benefit.  He denies suicidal/homicidal ideation.  He is followed by cardiology for CAD status post CABG 2013.  He denies chest pain, palpitations, SOB, numbness, tingling, weakness, headaches, or edema. He takes atorvastatin and ASA daily without adverse effects reported. He is also taking metoprolol for BP and is adherent to this medication without adverse effects. Diet: Follows a DASH Diet Exercise: Does not follow a particular diet; increased activity through remodeling his home and increasing steps in daily activities when possible.  He is due for Tdap and ShingRx today.  He is due for cardiology follow up and he is planning to schedule that this week.  Eczema noted on buttocks that has been an "ongoing problem" by patient. Treatment with OTC hydrocortisone cream has provided moderate benefit however has not cleared his skin. He denies pruritus, fever, chills, sweats, drainage, or pain.   Review of Systems Constitutional: No fever, chills, significant weight change, fatigue, weakness or night sweats Eyes: No redness, discharge, pain, blurred vision, double vision, or loss of vision ENT/mouth: No nasal congestion, postnasal  drainage,epistaxis, purulent discharge, earache, hearing loss, tinnitus ,sore throat , dental pain, or hoarseness   Cardiovascular: no chest pain, palpitations, racing, irregular rhythm, syncope, nausea, sweating, claudication, or edema  Respiratory: No cough, sputum production,hemoptysis,  dyspnea, paroxysmal nocturnal dyspnea, pleuritic chest pain, significant snoring, or  apnea    Gastrointestinal: No heartburn,dysphagia, nausea and vomiting,ominal pain, change in bowels, anorexia, diarrhea, significant constipation, rectal bleeding, melena,  stool incontinence or jaundice Genitourinary: No dysuria,hematuria, pyuria, frequency, urgency,  incontinence, nocturia, dark urine or flank pain Musculoskeletal: No myalgias or muscle cramping, joint stiffness, joint swelling, joint color change, weakness, or cyanosis Dermatologic: Rash on buttocks without pruritus, urticaria, or change in color or temperature of skin Neurologic: No headache, vertigo, limb weakness, tremor, gait disturbance, seizures, memory loss, numbness or tingling Psychiatric: No significant anxiety or depression, anhedonia, panic attacks, insomnia, or anorexia Endocrine: No change in hair/skin/ nails, excessive thirst, excessive hunger, excessive urination, or unexplained fatigue Hematologic/lymphatic: No bruising, lymphadenopathy,or  abnormal clotting Allergy/immunology: No itchy/ watery eyes, abnormal sneezing, rhinitis, urticaria ,or angioedema    Past Medical History:  Diagnosis Date  . CAD (coronary artery disease)    a.  LHC 06/26/12:  pLAD 90%, prox/mid/dist CFS 20-30%, oRCA 100% (non dom), EF 55%;  b. Echo 12/13:  EF 55-60%, Gr 1 diast dysfn;  c. s/p CABG [facility] Hockey) 12/13: L-LAD/Dx    . GERD (gastroesophageal reflux disease)   . HLD (hyperlipidemia)   . Hypertension   . Ocular migraine    "maybe once q couple months" (06/02/2014)  . Unstable angina pectoris [facility])      Social History   Social History  . Marital  status: Divorced    Spouse name: N/A  . Number of children: N/A  . Years of education: N/A   Occupational History  . Not on file.   Social History Main Topics  . Smoking status: Never Smoker  . Smokeless tobacco: Never Used  . Alcohol use 0.6 oz/week    1 Glasses of wine per week  . Drug use: No  . Sexual activity: Not Currently   Other Topics Concern  . Not on file   Social History Narrative   Divorced 7 years ago   3 children   Works in Aeronautical engineer    Past Surgical History:  Procedure Laterality Date  . APPENDECTOMY  1970's  . CARDIAC CATHETERIZATION  06/2012  . COLONOSCOPY  10-15 yrs ago   in [place]  . CORONARY ARTERY BYPASS GRAFT  06/29/2012   Procedure: CORONARY ARTERY BYPASS GRAFTING (CABG);  Surgeon: Melrose Nakayama, MD;  Location: [place];  Service: Open Heart Surgery;  Laterality: N/A;  times two using Left Internal Mammary Artery  . INGUINAL HERNIA REPAIR Bilateral [phone]  . LEFT HEART CATHETERIZATION WITH CORONARY ANGIOGRAM N/A 06/26/2012   Procedure: LEFT HEART CATHETERIZATION WITH CORONARY ANGIOGRAM;  Surgeon: Josue Hector, MD;  Location[facility] CATH LAB;  Service: Cardiovascular;  Laterality: N/A;  . LEFT HEART CATHETERIZATION WITH CORONARY/GRAFT ANGIOGRAM N/A 06/03/2014   Procedure: LEFT HEART CATHETERIZATION WITH Beatrix Fetters;  Surgeon: Jettie Booze, MD;  Location: [facility] CATH LAB;  Service: Cardiovascular;  Laterality: N/A;  . NASAL SEPTUM SURGERY     For uncontrolled epistaxis early 2013    Family History  Problem Relation Age of Onset  . Heart disease Brother        CABG age 70, redo bypass ~50  . Heart disease Paternal Grandmother   . Heart disease Cousin        Maternal side  . Heart attack Brother   . Colon cancer Maternal Grandfather 60    No Known Allergies  Current Outpatient Prescriptions on File Prior to Visit  Medication Sig Dispense Refill  . aspirin EC 81 MG tablet Take 1 tablet (81 mg total) by  mouth daily.    Marland Kitchen atorvastatin (LIPITOR) 40 MG tablet TAKE 1 TABLET BY MOUTH EVERY DAY AT 6 PM 90 tablet 1  . Coenzyme Q10 200 MG capsule Take 200 mg by mouth every morning. disontinue while in hospital    . ibuprofen (ADVIL,MOTRIN) 200 MG tablet Take 600 mg by mouth every 6 (six) hours as needed. migraine headache    . metoprolol succinate (TOPROL-XL) 100 MG 24 hr tablet TAKE 1 TABLET BY MOUTH TWICE DAILY WITH OR IMMEDIATELY FOLLOWING A MEAL. 180 tablet 1  . Multiple Vitamin (MULTIVITAMIN) tablet Take 1 tablet by mouth daily.    . nitroGLYCERIN (NITROSTAT) 0.4 MG SL tablet Place 1 tablet (0.4 mg total) under the tongue every 5 (five) minutes as needed for chest pain. 25 tablet 3  . zolpidem (AMBIEN) 5 MG tablet TAKE 1 TABLET BY MOUTH AT BEDTIME AS NEEDED FOR SLEEP 30 tablet 0   No current facility-administered medications on file prior to visit.     BP (!) 148/88 (BP Location: Left Arm, Patient Position: Sitting, Cuff Size: Normal)   Pulse (!) 58   Temp 97.7 F (36.5 C) (Oral)   Ht 5\' 11"  (1.803 m)   Wt 193 lb (87.5 kg)   SpO2 97%   BMI 26.92 kg/m    Objective:   Physical Exam  Physical Exam  Constitutional: He is oriented to person, place, and time. He appears well-developed and well-nourished. No distress.  HENT:  Head: Normocephalic and atraumatic.  Right Ear: Tympanic membrane and ear canal normal.  Left Ear: Tympanic membrane and ear canal normal.  Mouth/Throat: Oropharynx is clear and moist.  Eyes: Pupils are equal, round, and reactive to light. No scleral icterus.  Neck: Normal range of motion. No thyromegaly present.  Cardiovascular: Normal rate and regular rhythm.   No murmur heard. Pulmonary/Chest: Effort normal and breath sounds normal. No respiratory distress. He has no wheezes. He has no rales. He exhibits no tenderness.  Abdominal: Soft. Bowel sounds are normal. He exhibits no distension and no mass. There is no tenderness. There is no rebound and no guarding.    Genitourinary:  Rectum normal, prostate normal; penis normal Musculoskeletal: He exhibits no edema.  Lymphadenopathy:    He has no cervical adenopathy.  Neurological: He is alert and oriented to person, place, and time. He has normal reflexes. He exhibits normal muscle tone. Coordination normal.  Skin: Skin is warm and dry. Dry skin with mildly erythematous plaque noted on buttocks. No drainage or edema noted. Psychiatric: He has a normal mood and affect. His behavior is normal. Judgment and thought content normal.       Assessment & Plan:  1. Encounter for general adult medical examination with abnormal findings 61 y.o. male presenting for annual physical.  Health Maintenance counseling: 1. Anticipatory guidance: Patient counseled regarding regular dental exams, eye exams, wearing seatbelts.  2. Risk factor reduction:  Advised patient of need for regular exercise and diet rich and fruits and vegetables to reduce risk of heart attack and stroke.  3. Immunizations/screenings/ancillary studies  There is no immunization history on file for this patient.: Tdap to be given today; Patient declined ShingRx today but stated he will consider this in the future; written information regarding ShingRx provided today Health Maintenance Due  Topic Date Due  . COLONOSCOPY  08/28/2003   4. Prostate cancer screening- Today: Exam completed with Chaperone, Rosealee Albee, CMA,  PSA obtained today Lab Results  Component Value Date   PSA 0.78 01/31/2014   5. Colon cancer screening - Referral to GI for screening colonoscopy today 6. Skin cancer screening- No suspicious lesions noted and advised use of sunscreen today.  2. Anxiety and depression Improved; patient declines medication at this time; we discussed signs/symptoms to monitor for and advised close follow up for further evaluation and treatment if needed.  3. Essential hypertension Controlled today; adherent with medication; following DASH diet;  has follow up with cardiology which will be completed this month. Continue metoprolol and decrease risk factors through use of atorvastatin. - Hepatic function panel - TSH  4. Need for hepatitis C screening test  - Hepatitis C antibody; Future - Hepatitis C antibody  5. Screening for HIV (human immunodeficiency virus)  - HIV antibody (with reflex)  6. Screening for cholesterol level  - Lipid panel  7. Screening for prostate cancer Reviewed pros and cons of prostate cancer screening. DRE normal. Will obtain PSA and monitor  - PSA  8. Screening for colon cancer  - Ambulatory referral to Gastroenterology  9. Atopic dermatitis, unspecified type Advised use of triamcinolone for atopic dermatitis on buttocks with moisturizing daily. If symptoms do not improve with treatment, worsen, or new areas develop, follow up for further evaluation. - triamcinolone (KENALOG) 0.025 % ointment; Apply 1 application topically 2 (two) times daily.  Dispense: 30 g;  Refill: 0  Follow up in 6 months for chronic conditions or sooner if needed.  Delano Metz, FNP-C

## 2017-01-27 NOTE — Patient Instructions (Signed)
It was a pleasure to see you today!  We have ordered labs or studies at this visit. It can take up to 1-2 weeks for results and processing. IF results require follow up or explanation, we will call you with instructions. Clinically stable results will be released to your [facility]. If you have not heard from [place] or cannot find your results in [facility] in 2 weeks please contact our office at [phone].  If you are not yet signed up for [facility], please consider signing up   You will be contact about your referral for a colonoscopy.  Please let us know if you have not heard back within 1 week about your referral.  Health Maintenance, Male A healthy lifestyle and preventive care is important for your health and wellness. Ask your health care provider about what schedule of regular examinations is right for you. What should I know about weight and diet? Eat a Healthy Diet  Eat plenty of vegetables, fruits, whole grains, low-fat dairy products, and lean protein.  Do not eat a lot of foods high in solid fats, added sugars, or salt.  Maintain a Healthy Weight Regular exercise can help you achieve or maintain a healthy weight. You should:  Do at least 150 minutes of exercise each week. The exercise should increase your heart rate and make you sweat (moderate-intensity exercise).  Do strength-training exercises at least twice a week.  Watch Your Levels of Cholesterol and Blood Lipids  Have your blood tested for lipids and cholesterol every 5 years starting at 63 years of age. If you are at high risk for heart disease, you should start having your blood tested when you are 63 years old. You may need to have your cholesterol levels checked more often if: ? Your lipid or cholesterol levels are high. ? You are older than 63 years of age. ? You are at high risk for heart disease.  What should I know about cancer screening? Many types of cancers can be detected early and may often be prevented. Lung  Cancer  You should be screened every year for lung cancer if: ? You are a current smoker who has smoked for at least 30 years. ? You are a former smoker who has quit within the past 15 years.  Talk to your health care provider about your screening options, when you should start screening, and how often you should be screened.  Colorectal Cancer  Routine colorectal cancer screening usually begins at 63 years of age and should be repeated every 5-10 years until you are 63 years old. You may need to be screened more often if early forms of precancerous polyps or small growths are found. Your health care provider may recommend screening at an earlier age if you have risk factors for colon cancer.  Your health care provider may recommend using home test kits to check for hidden blood in the stool.  A small camera at the end of a tube can be used to examine your colon (sigmoidoscopy or colonoscopy). This checks for the earliest forms of colorectal cancer.  Prostate and Testicular Cancer  Depending on your age and overall health, your health care provider may do certain tests to screen for prostate and testicular cancer.  Talk to your health care provider about any symptoms or concerns you have about testicular or prostate cancer.  Skin Cancer  Check your skin from head to toe regularly.  Tell your health care provider about any new moles or changes in  moles, especially if: ? There is a change in a mole's size, shape, or color. ? You have a mole that is larger than a pencil eraser.  Always use sunscreen. Apply sunscreen liberally and repeat throughout the day.  Protect yourself by wearing long sleeves, pants, a wide-brimmed hat, and sunglasses when outside.  What should I know about heart disease, diabetes, and high blood pressure?  If you are 44-20 years of age, have your blood pressure checked every 3-5 years. If you are 32 years of age or older, have your blood pressure checked every  year. You should have your blood pressure measured twice-once when you are at a hospital or clinic, and once when you are not at a hospital or clinic. Record the average of the two measurements. To check your blood pressure when you are not at a hospital or clinic, you can use: ? An automated blood pressure machine at a pharmacy. ? A home blood pressure monitor.  Talk to your health care provider about your target blood pressure.  If you are between 60-55 years old, ask your health care provider if you should take aspirin to prevent heart disease.  Have regular diabetes screenings by checking your fasting blood sugar level. ? If you are at a normal weight and have a low risk for diabetes, have this test once every three years after the age of 72. ? If you are overweight and have a high risk for diabetes, consider being tested at a younger age or more often.  A one-time screening for abdominal aortic aneurysm (AAA) by ultrasound is recommended for men aged 59-75 years who are current or former smokers. What should I know about preventing infection? Hepatitis B If you have a higher risk for hepatitis B, you should be screened for this virus. Talk with your health care provider to find out if you are at risk for hepatitis B infection. Hepatitis C Blood testing is recommended for:  Everyone born from [birth year] through 1967.  Anyone with known risk factors for hepatitis C.  Sexually Transmitted Diseases (STDs)  You should be screened each year for STDs including gonorrhea and chlamydia if: ? You are sexually active and are younger than 63 years of age. ? You are older than 63 years of age and your health care provider tells you that you are at risk for this type of infection. ? Your sexual activity has changed since you were last screened and you are at an increased risk for chlamydia or gonorrhea. Ask your health care provider if you are at risk.  Talk with your health care provider about  whether you are at high risk of being infected with HIV. Your health care provider may recommend a prescription medicine to help prevent HIV infection.  What else can I do?  Schedule regular health, dental, and eye exams.  Stay current with your vaccines (immunizations).  Do not use any tobacco products, such as cigarettes, chewing tobacco, and e-cigarettes. If you need help quitting, ask your health care provider.  Limit alcohol intake to no more than 2 drinks per day. One drink equals 12 ounces of beer, 5 ounces of wine, or 1 ounces of hard liquor.  Do not use street drugs.  Do not share needles.  Ask your health care provider for help if you need support or information about quitting drugs.  Tell your health care provider if you often feel depressed.  Tell your health care provider if you have ever been abused  or do not feel safe at home. This information is not intended to replace advice given to you by your health care provider. Make sure you discuss any questions you have with your health care provider. Document Released: 01/04/2008 Document Revised: 03/06/2016 Document Reviewed: 04/11/2015 Elsevier Interactive Patient Education  [employer].

## 2017-01-28 LAB — HIV ANTIBODY (ROUTINE TESTING W REFLEX): HIV 1&2 Ab, 4th Generation: NONREACTIVE

## 2017-01-28 LAB — HEPATITIS C ANTIBODY: HCV Ab: NEGATIVE

## 2017-01-29 ENCOUNTER — Telehealth: Payer: Self-pay | Admitting: Cardiovascular Disease

## 2017-01-29 ENCOUNTER — Telehealth: Payer: Self-pay

## 2017-01-29 DIAGNOSIS — I2510 Atherosclerotic heart disease of native coronary artery without angina pectoris: Secondary | ICD-10-CM

## 2017-01-29 NOTE — Telephone Encounter (Signed)
Called the patient and asked him if he wanted to schedule his cardiac MRI.  The patient says he has been feeling fine and does not wish to schedule at this time.

## 2017-01-29 NOTE — Telephone Encounter (Signed)
Per Dr. Kyla Balzarine office note on 04/22/16. Will reorder MRI.  "CAD/CABG: LIMA LAD/D1 patent cath 2015 myovue 10/2015 anterior MI no ischemia EF 41% But echo in March showed normal EF.  Will order MRI to quantitate EF and see if anterior wall Down  May need to start ACE"  Put in order so patient can have it scheduled.

## 2017-01-29 NOTE — Telephone Encounter (Signed)
-----   Message from Corinna Lines sent at 01/27/2017  8:07 AM EDT ----- Regarding: referral   Pam  I need a new referral for the cardiac MRI if the patient still needs it. It had been cancelled by the patient in 10-17.  Thanks [facility]

## 2017-02-13 ENCOUNTER — Telehealth: Payer: Self-pay | Admitting: Cardiovascular Disease

## 2017-02-13 NOTE — Telephone Encounter (Signed)
Called the patient to find out when he wanted the cardiac MRI scheduled and he stated that he is feeling fine and does not wish to schedule it at this time.

## 2017-02-13 NOTE — Telephone Encounter (Signed)
Per [place], the scheduler,  patient refused to have MRI done.

## 2017-02-23 ENCOUNTER — Other Ambulatory Visit: Payer: Self-pay | Admitting: Family Medicine

## 2017-02-27 ENCOUNTER — Encounter: Payer: Self-pay | Admitting: Family Medicine

## 2017-04-10 ENCOUNTER — Encounter: Payer: Self-pay | Admitting: Family Medicine

## 2017-05-08 ENCOUNTER — Other Ambulatory Visit: Payer: Self-pay | Admitting: Cardiology

## 2017-06-09 ENCOUNTER — Other Ambulatory Visit: Payer: Self-pay | Admitting: Cardiology

## 2017-06-11 ENCOUNTER — Telehealth: Payer: Self-pay | Admitting: *Deleted

## 2017-06-11 NOTE — Telephone Encounter (Signed)
Patient called with questions about his metoprolol. He stated that he has always taken 80 mg but the rx that he picked up recently is for 100 mg. I made him aware that metoprolol does not come in 80 mg but he stressed that he has indeed been taking 80mg  and that is what has been on his bottle. He requested that I review his medication history to be sure. I reviewed this and made him aware that there were previous orders for 25 mg, 50 mg, 75 mg and the most recent orders for 100 mg. He then questioned me as to when his dose was changed and why he was not notified. Looks like his dose was changed to 100 mg bid in 10/2015. He stated that when he picked it up from the pharmacy recently, it was ordered for 100 mg qd. He is aware that I am sending you a message to clarify and I have also stressed the need for him to schedule an appointment. He wants to know why he needs to schedule an appointment if he is feeling fine and I explained this to him. He then asked me if he needs to see Dr Curt Bears as well as Dr Johnsie Cancel. Please advise. Thanks, MI

## 2017-06-11 NOTE — Telephone Encounter (Signed)
Patient should follow up with Dr. Johnsie Cancel and if [place] feels he needs to be seen by Electrophysiologist again , then [facility] can refer back to [place] --- if this is what pt prefers.  I am unsure of how to advise on the Metoprolol dosing.  (pt should not have been taking 80 mg as you discussed w/ him).  He should at least be taking 75 mg BID.   I was not the covering nurse the day he saw [name] and cannot seem to find where increase was suggested by physician - its not in the office note nor AVS.  Pt should be taking at least 75 mg BID and can discuss w/ Nishan if he should go up to 100 mg.

## 2017-06-16 DIAGNOSIS — H04123 Dry eye syndrome of bilateral lacrimal glands: Secondary | ICD-10-CM | POA: Diagnosis not present

## 2017-06-16 MED ORDER — METOPROLOL TARTRATE 100 MG PO TABS: 100.0000 mg | ORAL_TABLET | Freq: Two times a day (BID) | ORAL | 3 refills | Status: DC

## 2017-06-16 NOTE — Telephone Encounter (Signed)
F/u Message  Pt call requesting to speak with RN about getting a new prescription for his Metoprolol. Pt stats the pharmacy has on the instructions for him to take 1 daily. Pt states he has been taking 2 daily. Please call back to discuss

## 2017-06-16 NOTE — Telephone Encounter (Signed)
Informed pt that after researching his chart -- pt should be taking Lopressor 100 mg BID.  New Rx sent to [facility] per pt request. Pt understands Rx is for 100 mg tablets. Pt understands to stop the Toprol and re-start Lopressor.  Also informed him there is no concern being that he was taking Toprol BID, per Dr. Curt Bears. Patient verbalized understanding and agreeable to plan.

## 2017-07-16 DIAGNOSIS — G43109 Migraine with aura, not intractable, without status migrainosus: Secondary | ICD-10-CM | POA: Insufficient documentation

## 2017-07-16 DIAGNOSIS — E785 Hyperlipidemia, unspecified: Secondary | ICD-10-CM | POA: Insufficient documentation

## 2017-07-31 ENCOUNTER — Encounter: Payer: Self-pay | Admitting: Cardiology

## 2017-07-31 ENCOUNTER — Ambulatory Visit: Payer: BLUE CROSS/BLUE SHIELD | Admitting: Cardiology

## 2017-07-31 VITALS — BP 158/98 | HR 52 | Ht 71.0 in | Wt 201.2 lb

## 2017-07-31 DIAGNOSIS — I10 Essential (primary) hypertension: Secondary | ICD-10-CM | POA: Diagnosis not present

## 2017-07-31 DIAGNOSIS — I471 Supraventricular tachycardia: Secondary | ICD-10-CM | POA: Diagnosis not present

## 2017-07-31 MED ORDER — DILTIAZEM HCL ER COATED BEADS 300 MG PO CP24: 300.0000 mg | ORAL_CAPSULE | Freq: Every day | ORAL | 11 refills | Status: DC

## 2017-07-31 NOTE — Addendum Note (Signed)
Addended by: Stanton Kidney on: 07/31/2017 11:56 AM   Modules accepted: Orders

## 2017-07-31 NOTE — Patient Instructions (Addendum)
Medication Instructions:  Your physician has recommended you make the following change in your medication:  1. STOP Metoprolol Tartrate 2. START Diltiazem 300 mg once daily  * If you need a refill on your cardiac medications before your next appointment, please call your pharmacy. *  Labwork: None ordered  Testing/Procedures: None ordered  Follow-Up: Your physician wants you to follow-up in: 1 year with Chanetta Marshall, NP/Renee Sunday Spillers.  You will receive a reminder letter in the mail two months in advance. If you don't receive a letter, please call our office to schedule the follow-up appointment.  Thank you for choosing [facility]!!   Trinidad Curet, RN [phone]  Any Other Special Instructions Will Be Listed Below (If Applicable). Diltiazem extended-release capsules or tablets What is this medicine? DILTIAZEM (dil TYE a zem) is a calcium-channel blocker. It affects the amount of calcium found in your heart and muscle cells. This relaxes your blood vessels, which can reduce the amount of work the heart has to do. This medicine is used to treat high blood pressure and chest pain caused by angina. This medicine may be used for other purposes; ask your health care provider or pharmacist if you have questions. COMMON BRAND NAME(S): Cardizem CD, Cardizem LA, Cardizem SR, Cartia XT, Dilacor XR, Dilt-CD, Diltia XT, Diltzac, Matzim LA, Rema Fendt, Tiamate, Tiazac What should I tell my health care provider before I take this medicine? They need to know if you have any of these conditions: -heart problems, low blood pressure, irregular heartbeat -liver disease -previous heart attack -an unusual or allergic reaction to diltiazem, other medicines, foods, dyes, or preservatives -pregnant or trying to get pregnant -breast-feeding How should I use this medicine? Take this medicine by mouth with a glass of water. Follow the directions on the prescription label. Swallow whole, do not crush  or chew. Ask your doctor or pharmacist if your should take this medicine with food. Take your doses at regular intervals. Do not take your medicine more often then directed. Do not stop taking except on the advice of your doctor or health care professional. Ask your doctor or health care professional how to gradually reduce the dose. Talk to your pediatrician regarding the use of this medicine in children. Special care may be needed. Overdosage: If you think you have taken too much of this medicine contact a poison control center or emergency room at once. NOTE: This medicine is only for you. Do not share this medicine with others. What if I miss a dose? If you miss a dose, take it as soon as you can. If it is almost time for your next dose, take only that dose. Do not take double or extra doses. What may interact with this medicine? Do not take this medicine with any of the following medications: -cisapride -hawthorn -pimozide -ranolazine -red yeast rice This medicine may also interact with the following medications: -buspirone -carbamazepine -cimetidine -cyclosporine -digoxin -local anesthetics or general anesthetics -lovastatin -medicines for anxiety or difficulty sleeping like midazolam and triazolam -medicines for high blood pressure or heart problems -quinidine -rifampin, rifabutin, or rifapentine This list may not describe all possible interactions. Give your health care provider a list of all the medicines, herbs, non-prescription drugs, or dietary supplements you use. Also tell them if you smoke, drink alcohol, or use illegal drugs. Some items may interact with your medicine. What should I watch for while using this medicine? Check your blood pressure and pulse rate regularly. Ask your doctor or health care  professional what your blood pressure and pulse rate should be and when you should contact him or her. You may feel dizzy or lightheaded. Do not drive, use machinery, or do  anything that needs mental alertness until you know how this medicine affects you. To reduce the risk of dizzy or fainting spells, do not sit or stand up quickly, especially if you are an older patient. Alcohol can make you more dizzy or increase flushing and rapid heartbeats. Avoid alcoholic drinks. What side effects may I notice from receiving this medicine? Side effects that you should report to your doctor or health care professional as soon as possible: -allergic reactions like skin rash, itching or hives, swelling of the face, lips, or tongue -confusion, mental depression -feeling faint or lightheaded, falls -redness, blistering, peeling or loosening of the skin, including inside the mouth -slow, irregular heartbeat -swelling of the feet and ankles -unusual bleeding or bruising, pinpoint red spots on the skin Side effects that usually do not require medical attention (report to your doctor or health care professional if they continue or are bothersome): -constipation or diarrhea -difficulty sleeping -facial flushing -headache -nausea, vomiting -sexual dysfunction -weak or tired This list may not describe all possible side effects. Call your doctor for medical advice about side effects. You may report side effects to FDA at 1-800-FDA-1088. Where should I keep my medicine? Keep out of the reach of children. Store at room temperature between 15 and 30 degrees C (59 and 86 degrees F). Protect from humidity. Throw away any unused medicine after the expiration date. NOTE: This sheet is a summary. It may not cover all possible information. If you have questions about this medicine, talk to your doctor, pharmacist, or health care provider.  2018 Elsevier/Gold Standard (2007-10-29 14:35:47)

## 2017-07-31 NOTE — Progress Notes (Signed)
Electrophysiology Office Note   Date:  07/31/2017   ID:  Terry Hansen, DOB Apr 14, 1956, MRN [id number]  PCP:  Delano Metz, FNP  Cardiologist: Johnsie Cancel  Primary Electrophysiologist:  Constance Haw, MD    No chief complaint on file.    History of Present Illness: Terry Hansen is a 62 y.o. male who presents today for electrophysiology evaluation.   He presented to the hospital at the end of March with palpitations and was found to have an atrial tachycardia and multiple short runs of tachycardia. At that time, he was started on metoprolol 75 mg seems to control his symptoms. He re-presented to the hospital on 4/10 with similar symptoms. His metoprolol was increased to 100 mg twice a day and he was also prescribed diltiazem.  He is felt well that since that time other than fatigue.  His fatigue has occurred since the increased dose of metoprolol.  Today, denies symptoms of palpitations, chest pain, shortness of breath, orthopnea, PND, lower extremity edema, claudication, dizziness, presyncope, syncope, bleeding, or neurologic sequela. The patient is tolerating medications without difficulties.     Past Medical History:  Diagnosis Date  . CAD (coronary artery disease)    a.  LHC 06/26/12:  pLAD 90%, prox/mid/dist CFS 20-30%, oRCA 100% (non dom), EF 55%;  b. Echo 12/13:  EF 55-60%, Gr 1 diast dysfn;  c. s/p CABG [facility]) 12/13: L-LAD/Dx    . GERD (gastroesophageal reflux disease)   . HLD (hyperlipidemia)   . Hypertension   . Ocular migraine    "maybe once q couple months" (06/02/2014)  . Unstable angina pectoris [facility])    Past Surgical History:  Procedure Laterality Date  . APPENDECTOMY  1970's  . CARDIAC CATHETERIZATION  06/2012  . COLONOSCOPY  10-15 yrs ago   in [place]  . CORONARY ARTERY BYPASS GRAFT  06/29/2012   Procedure: CORONARY ARTERY BYPASS GRAFTING (CABG);  Surgeon: Melrose Nakayama, MD;  Location: [facility];  Service: Open Heart Surgery;  Laterality:  N/A;  times two using Left Internal Mammary Artery  . INGUINAL HERNIA REPAIR Bilateral [phone]  . LEFT HEART CATHETERIZATION WITH CORONARY ANGIOGRAM N/A 06/26/2012   Procedure: LEFT HEART CATHETERIZATION WITH CORONARY ANGIOGRAM;  Surgeon: Josue Hector, MD;  Location: [facility] CATH LAB;  Service: Cardiovascular;  Laterality: N/A;  . LEFT HEART CATHETERIZATION WITH CORONARY/GRAFT ANGIOGRAM N/A 06/03/2014   Procedure: LEFT HEART CATHETERIZATION WITH Beatrix Fetters;  Surgeon: Jettie Booze, MD;  Location: [facility] CATH LAB;  Service: Cardiovascular;  Laterality: N/A;  . NASAL SEPTUM SURGERY     For uncontrolled epistaxis early 2013     Current Outpatient Medications  Medication Sig Dispense Refill  . aspirin EC 81 MG tablet Take 1 tablet (81 mg total) by mouth daily.    Marland Kitchen atorvastatin (LIPITOR) 40 MG tablet TAKE 1 TABLET BY MOUTH EVERY DAY AT 6 PM 90 tablet 1  . Coenzyme Q10 200 MG capsule Take 200 mg by mouth every morning. disontinue while in hospital    . diltiazem (CARDIZEM) 30 MG tablet Take 30 mg by mouth 4 (four) times daily as needed (elevated heart rates).    Marland Kitchen ibuprofen (ADVIL,MOTRIN) 200 MG tablet Take 600 mg by mouth every 6 (six) hours as needed. migraine headache    . metoprolol tartrate (LOPRESSOR) 100 MG tablet Take 1 tablet (100 mg total) by mouth 2 (two) times daily. 180 tablet 3  . nitroGLYCERIN (NITROSTAT) 0.4 MG SL tablet Place 1 tablet (0.4 mg total) under the  tongue every 5 (five) minutes as needed for chest pain. 25 tablet 3   No current facility-administered medications for this visit.     Allergies:   Patient has no known allergies.   Social History:  The patient  reports that  has never smoked. he has never used smokeless tobacco. He reports that he drinks about 0.6 oz of alcohol per week. He reports that he does not use drugs.   Family History:  The patient's family history includes Colon cancer (age of onset: 76) in his maternal grandfather; Heart attack in  his brother; Heart disease in his brother, cousin, and paternal grandmother.    ROS:  Please see the history of present illness.   Otherwise, review of systems is positive for none.   All other systems are reviewed and negative.   PHYSICAL EXAM: VS:  BP (!) 158/98   Pulse (!) 52   Ht 5\' 11"  (1.803 m)   Wt 201 lb 3.2 oz (91.3 kg)   SpO2 98%   BMI 28.06 kg/m  , BMI Body mass index is 28.06 kg/m. GEN: Well nourished, well developed, in no acute distress  HEENT: normal  Neck: no JVD, carotid bruits, or masses Cardiac: RRR; no murmurs, rubs, or gallops,no edema  Respiratory:  clear to auscultation bilaterally, normal work of breathing GI: soft, nontender, nondistended, + BS MS: no deformity or atrophy  Skin: warm and dry Neuro:  Strength and sensation are intact Psych: euthymic mood, full affect  EKG:  EKG is ordered today. Personal review of the ekg ordered shows sinus rhythm iRBBB, rate 52   Recent Labs: 01/02/2017: BUN 14; Creatinine, Ser 0.97; Hemoglobin 15.7; Platelets 225.0; Potassium 4.2; Sodium 139 01/27/2017: ALT 33; TSH 1.88    Lipid Panel     Component Value Date/Time   CHOL 121 01/27/2017 1024   TRIG 65.0 01/27/2017 1024   HDL 45.00 01/27/2017 1024   CHOLHDL 3 01/27/2017 1024   VLDL 13.0 01/27/2017 1024   LDLCALC 63 01/27/2017 1024     Wt Readings from Last 3 Encounters:  07/31/17 201 lb 3.2 oz (91.3 kg)  01/27/17 193 lb (87.5 kg)  01/02/17 194 lb 6.4 oz (88.2 kg)      Other studies Reviewed: Additional studies/ records that were reviewed today include: Nuclear stress 11/02/15  Review of the above records today demonstrates:   Nuclear stress EF: 41%.  There was no ST segment deviation noted during stress.  Defect 1: There is a large defect of moderate severity present in the mid anterior, mid inferolateral, apical anterior, apical lateral and apex location.  Findings consistent with prior anteriolateral myocardial infarction. There is no evidence of  ischemia.  This is an intermediate risk study.  TTE 10/20/15 - Left ventricle: The cavity size was normal. Systolic function was  normal. The estimated ejection fraction was in the range of 60%  to 65%. Wall motion was normal; there were no regional wall  motion abnormalities. - Aortic valve: Possibly bicuspid; severely thickened, severely  calcified leaflets. Valve mobility was restricted. There was mild  stenosis. There was mild regurgitation. Peak gradient (S): 33 mm  Hg. Valve area (VTI): 1.86 cm^2. Valve area (Vmax): 2.65 cm^2.  Valve area (Vmean): 2.67 cm^2. - Aortic root: The aortic root was normal in size. - Mitral valve: There was mild regurgitation. - Left atrium: The atrium was normal in size. - Right ventricle: Systolic function was normal. - Right atrium: The atrium was normal in size. - Tricuspid valve:  There was mild regurgitation. - Pulmonary arteries: Systolic pressure was within the normal  range. - Inferior vena cava: The vessel was normal in size. - Pericardium, extracardiac: There was no pericardial effusion.  ASSESSMENT AND PLAN:  1.  Atrial tachycardia: Atrial tachycardia has been better controlled.  Unfortunately he is continued to have symptoms of fatigue, which has been more since metoprolol was increased.  Stop metoprolol today and start him on diltiazem 300 mg, which may improve his fatigue.  He may require further medical management in the future.  At this point he is sure that he does not want ablation.  2.  Hypertension: Blood pressure is elevated today, but has been normal on his prior visits.  He Derwood Becraft check it at home.  We Megon Kalina hold off on further therapy at this time.  Current medicines are reviewed at length with the patient today.   The patient does not have concerns regarding his medicines.  The following changes were made today: Stop metoprolol, start diltiazem  Labs/ tests ordered today include:  Orders Placed This Encounter    Procedures  . EKG 12-Lead     Disposition:   FU with Carmichael Burdette 12 months  Signed, Brittiney Dicostanzo Meredith Leeds, MD  07/31/2017 11:13 AM     [facility] [address] [place] [address] [phone] (office) [phone] (fax)

## 2017-08-01 ENCOUNTER — Telehealth: Payer: Self-pay | Admitting: Cardiology

## 2017-08-01 NOTE — Telephone Encounter (Signed)
Patient called to see if he could finish out his 45 days of Metoprolol prior to starting Cardizem. Per Dr. Macky Lower office note, "Atrial tachycardia: Atrial tachycardia has been better controlled.  Unfortunately he is continued to have symptoms of fatigue, which has been more since metoprolol was increased.  Stop metoprolol today and start him on diltiazem 300 mg, which may improve his fatigue" Reiterated to patient that Dr. Curt Bears changed the medications in hopes the patient would feel better, so switching now may be more beneficial than waiting several weeks. Patient understands to call if symptoms worsen or do not improve once he starts the Cardizem. He was grateful for call and agrees with treatment plan.

## 2017-08-01 NOTE — Telephone Encounter (Signed)
New message   Pt c/o medication issue:  1. Name of Medication: diltiazem (CARDIZEM CD) 300 MG 24 hr capsule  2. How are you currently taking this medication (dosage and times per day)? Take 1 capsule (300 mg total) by mouth daily  3. Are you having a reaction (difficulty breathing--STAT)? No  4. What is your medication issue? Patient still has 45 days of his Metroprolol and wants to know if he can finish taking that before switching to the diltiazem Please call

## 2017-08-02 ENCOUNTER — Telehealth: Payer: Self-pay | Admitting: Cardiology

## 2017-08-02 NOTE — Telephone Encounter (Signed)
Pt called answering service this morning concerned about his blood pressure. I called him back. He has just been switched from metoprolol to cardizem CD 300 mg for atrial tachycardia. His first dose was this morning. Within 2 hours of the dose he noted his BP to be 163/102 on the left and 177/91 on the right. He felt like his face was flushed and his ears felt full. He denied any chest discomfort, shortness of breath or lightheadedness. We discussed that it may take a day or two for the new med to gain full effectiveness. He agrees to relax and see how he feels in a few hours. I do not think this BP is high enough to warrant an ED visit unless he develops more symptoms.  I called the Mr. Cederberg back and he states that he is feeling much better. He appreciated the call.   Daune Perch, AGNP-C [facility] 08/02/2017  5:34 PM

## 2017-08-03 ENCOUNTER — Telehealth: Payer: Self-pay | Admitting: Cardiology

## 2017-08-03 ENCOUNTER — Other Ambulatory Visit: Payer: Self-pay | Admitting: Cardiology

## 2017-08-03 MED ORDER — AMLODIPINE BESYLATE 5 MG PO TABS: 5.0000 mg | ORAL_TABLET | Freq: Every day | ORAL | 11 refills | Status: DC

## 2017-08-03 MED ORDER — LOSARTAN POTASSIUM 25 MG PO TABS: 25.0000 mg | ORAL_TABLET | Freq: Every day | ORAL | 11 refills | Status: DC

## 2017-08-03 NOTE — Progress Notes (Signed)
Pt called in with continued elevated BP. Will start losartan 25 mg daily.

## 2017-08-03 NOTE — Telephone Encounter (Signed)
Pt called in with continued elevated BP. 162/105 with heart rate 107. No chest pain, shortness of breath or lightheadedness. He is concerned about the blood pressure. He has no known drug allergies. Kidney function is normal per last labs. I have sent in a prescription for losartan 25 mg.  I will request that he be called to arrange  For office visit to address blood pressure.   Daune Perch, AGNP-C [facility] 08/03/2017  12:25 PM

## 2017-08-04 ENCOUNTER — Telehealth: Payer: Self-pay | Admitting: Cardiovascular Disease

## 2017-08-04 MED ORDER — DILTIAZEM HCL 30 MG PO TABS: 30.0000 mg | ORAL_TABLET | Freq: Four times a day (QID) | ORAL | 3 refills | Status: DC | PRN

## 2017-08-04 NOTE — Telephone Encounter (Signed)
Tired to call pharmacy back was put on hold several times, for a total of 15 minutes. Will call back later.

## 2017-08-04 NOTE — Telephone Encounter (Signed)
Called patient about making an appointment for f/u after starting Losartan. Patient already has f/u appt with Dr. Johnsie Cancel on 08/14/17. At patient's last office visit with Dr. Curt Bears he was switched from metoprolol to Cardizem CD 300 mg capsule. Patient stated he still has a racing heart. Asked patient about his cardizem 30 mg tablet that is PRN for elevated heart rate. Patient stated he has not been taking the Cardizem 30 mg. Encouraged patient to check his HR and to take Cardizem 30 mg if his HR is above 100. Patient stated he forgot about the Cardizem 30 mg PRN tablet, and will take it to see if that helps. Will forward to Dr. Johnsie Cancel so he is aware.

## 2017-08-04 NOTE — Addendum Note (Signed)
Addended by: Aris Georgia, Celedonio Sortino L on: 08/04/2017 09:54 AM   Modules accepted: Orders

## 2017-08-04 NOTE — Telephone Encounter (Signed)
[place] calling,  Pt c/o medication issue:  1. Name of Medication: diltiazem   2. How are you currently taking this medication (dosage and times per day)? 30 mg  3. Are you having a reaction (difficulty breathing--STAT)? no  4. What is your medication issue? Patient had a prescription on a scheduled dosage and a new prescription was sent for as needed. Pharmacy would like top verify if they could cancel the scheduled dosage?

## 2017-08-04 NOTE — Telephone Encounter (Signed)
Informed pharmacist at [facility] that patient is on both Cardizem CD 300 mg daily and Cardizem 30 mg PRN for elevated HR. Pharmacist stated he would make a note in patient's chart.

## 2017-08-04 NOTE — Telephone Encounter (Signed)
Lets increase losartan to 50 mg. If BP continues to be elevated, may need increase to 100 mg.

## 2017-08-05 NOTE — Telephone Encounter (Signed)
Pt BP improved since starting Losartan 25 mg.  Yesterday 134/92, HR 80.  Today 129/95, HR 110. Pt going to continue to monitor.  We are not increasing Losartan at this time d/t BP controlled.  He has follow up next week with Dr. Johnsie Cancel and they will address if changes are needed at that appt. Pt is agreeable to plan.

## 2017-08-06 ENCOUNTER — Telehealth: Payer: Self-pay | Admitting: Cardiology

## 2017-08-06 NOTE — Telephone Encounter (Signed)
Should increase losartan to 50 mg.

## 2017-08-06 NOTE — Telephone Encounter (Signed)
B/p and heart rate readings Today 186/103  93  1/15 134/82     80 1/14 151/94     97 1/13 129/95     110  These readings are since stopping metoprolol and starting Diltiazem . Pt has concerns  Will forward to Dr Curt Bears for review and recommendations./cy

## 2017-08-06 NOTE — Telephone Encounter (Signed)
New Message  Pt c/o BP issue: STAT if pt c/o blurred vision, one-sided weakness or slurred speech  1. What are your last 5 BP readings?  186/103 hr 93  2. Are you having any other symptoms (ex. Dizziness, headache, blurred vision, passed out)?   3. What is your BP issue? High bp

## 2017-08-06 NOTE — Telephone Encounter (Signed)
Pt aware of recommendations .Pt has an appt next week with Dr Johnsie Cancel and will give an update at that time .Adonis Housekeeper

## 2017-08-11 NOTE — Progress Notes (Signed)
Patient ID: Terry Hansen, male   DOB: 1955-09-03, 62 y.o.   MRN: [id number]             HPI: This is a 62 y.o.  male patient who has history of coronary artery disease status post CABG in 2013 with a LIMA to the LAD and diagonal. Last cardiac catheterization on 06/03/14. Previously placed bypass grafts were patent and there was no other critical disease. Medical therapy was recommended for possible small vessel disease.  Patient also has a history of hypertension and hyperlipidemia.    Most recently seen by Dr Curt Bears for atrial tachycardia. Initially Rx with lopressor up to 100 bid But had fatigue and decreased libido. Changed to LA cardizem an ARB. Had SA cardizem for additional palpitations Last seen by Dr Curt Bears 07/31/17 with no additional recommendations  At one point was supposed to start Multaq but never done   Cardiac Cath: 06/03/2014 Reviewed on CV Image   ANGIOGRAPHIC DATA:   The left main coronary artery is absent. There are separate ostia for the LAD and circumflex. The left anterior descending artery is patent proximally. After a small diagonal branch, there is an 80% stenosis. There is mild disease in the mid LAD. The insertion of the LIMA is well seen. There is competitive flow in the distal LAD. The left circumflex artery is a large vessel. The first obtuse marginal is large and widely patent. In the mid circumflex, there is a 30-40% lesion at the origin of an atrial branch, which was present on prior angiogram.  The OM 3 and OM for are medium size and widely patent. The right coronary artery is a small, nondominant vessel. There is mild disease in this vessel. The LIMA to LAD and diagonal is widely patent. LEFT VENTRICULOGRAM:  Left ventricular angiogram was done in the 30 RAO projection and revealed normal left ventricular wall motion and systolic function with an estimated ejection fraction of 55%.  LVEDP was 6 mmHg. IMPRESSIONS:   1. Absent left main coronary artery. 2. 80%  mid left anterior descending artery stenosis.  Patent LIMA to LAD/Diagonal.   3. Mild disease left circumflex artery and its branches. 4. Patent nondominant right coronary artery. 5. Normal left ventricular systolic function.  LVEDP 6 mmHg.  Ejection fraction 55%. RECOMMENDATION:  Continue medical therapy.  Cardiology follow-up with Dr. Johnsie Cancel.  Per the progress note from today, consider GI w/u.  Myovue:  11/02/15 non ischemic   Nuclear stress EF: 41%.  There was no ST segment deviation noted during stress.  Defect 1: There is a large defect of moderate severity present in the mid anterior, mid inferolateral, apical anterior, apical lateral and apex location.  Findings consistent with prior anteriolateral myocardial infarction. There is no evidence of ischemia.  This is an intermediate risk study.   He is very anxious and high strung BP up. He really doesn't have an intolerance to beta blockers He was taking Toprol 200 mg bid by mistake and felt fatigued    No Known Allergies   Current Outpatient Medications  Medication Sig Dispense Refill  . aspirin EC 81 MG tablet Take 1 tablet (81 mg total) by mouth daily.    Marland Kitchen atorvastatin (LIPITOR) 40 MG tablet TAKE 1 TABLET BY MOUTH EVERY DAY AT 6 PM 90 tablet 1  . Coenzyme Q10 200 MG capsule Take 200 mg by mouth every morning. disontinue while in hospital    . diltiazem (CARDIZEM CD) 300 MG 24 hr capsule Take 1 capsule (  300 mg total) by mouth daily. 30 capsule 11  . diltiazem (CARDIZEM) 30 MG tablet Take 1 tablet (30 mg total) by mouth 4 (four) times daily as needed (elevated heart rates). 30 tablet 3  . ibuprofen (ADVIL,MOTRIN) 200 MG tablet Take 600 mg by mouth every 6 (six) hours as needed. migraine headache    . losartan (COZAAR) 25 MG tablet Take 1 tablet (25 mg total) by mouth daily. 30 tablet 11  . nitroGLYCERIN (NITROSTAT) 0.4 MG SL tablet Place 1 tablet (0.4 mg total) under the tongue every 5 (five) minutes as needed for chest pain.  25 tablet 3   No current facility-administered medications for this visit.     Past Medical History:  Diagnosis Date  . CAD (coronary artery disease)    a.  LHC 06/26/12:  pLAD 90%, prox/mid/dist CFS 20-30%, oRCA 100% (non dom), EF 55%;  b. Echo 12/13:  EF 55-60%, Gr 1 diast dysfn;  c. s/p CABG [facility]) 12/13: L-LAD/Dx    . GERD (gastroesophageal reflux disease)   . HLD (hyperlipidemia)   . Hypertension   . Ocular migraine    "maybe once q couple months" (06/02/2014)  . Unstable angina pectoris [facility])     Past Surgical History:  Procedure Laterality Date  . APPENDECTOMY  1970's  . CARDIAC CATHETERIZATION  06/2012  . COLONOSCOPY  10-15 yrs ago   in [place]  . CORONARY ARTERY BYPASS GRAFT  06/29/2012   Procedure: CORONARY ARTERY BYPASS GRAFTING (CABG);  Surgeon: Melrose Nakayama, MD;  Location: [place];  Service: Open Heart Surgery;  Laterality: N/A;  times two using Left Internal Mammary Artery  . INGUINAL HERNIA REPAIR Bilateral [phone]  . LEFT HEART CATHETERIZATION WITH CORONARY ANGIOGRAM N/A 06/26/2012   Procedure: LEFT HEART CATHETERIZATION WITH CORONARY ANGIOGRAM;  Surgeon: Josue Hector, MD;  Location[facility] C[facility] LAB;  Service: Cardiovascular;  Laterality: N/A;  . LEFT HEART CATHETERIZATION WITH CORONARY/GRAFT ANGIOGRAM N/A 06/03/2014   Procedure: LEFT HEART CATHETERIZATION WITH Beatrix Fetters;  Surgeon: Jettie Booze, MD;  Location: [facility] CATH LAB;  Service: Cardiovascular;  Laterality: N/A;  . NASAL SEPTUM SURGERY     For uncontrolled epistaxis early 2013    Family History  Problem Relation Age of Onset  . Heart disease Brother        CABG age 1, redo bypass ~50  . Heart disease Paternal Grandmother   . Heart disease Cousin        Maternal side  . Heart attack Brother   . Colon cancer Maternal Grandfather 49    Social History   Socioeconomic History  . Marital status: Divorced    Spouse name: Not on file  . Number of children: Not on  file  . Years of education: Not on file  . Highest education level: Not on file  Social Needs  . Financial resource strain: Not on file  . Food insecurity - worry: Not on file  . Food insecurity - inability: Not on file  . Transportation needs - medical: Not on file  . Transportation needs - non-medical: Not on file  Occupational History  . Not on file  Tobacco Use  . Smoking status: Never Smoker  . Smokeless tobacco: Never Used  Substance and Sexual Activity  . Alcohol use: Yes    Alcohol/week: 0.6 oz    Types: 1 Glasses of wine per week  . Drug use: No  . Sexual activity: Not Currently  Other Topics Concern  . Not on  file  Social History Narrative   Divorced 7 years ago   3 children   Works in Aeronautical engineer    ROS:  insomnia and anxiety mild GERD all other symptoms reviewed and negative   Vitals:   08/14/17 0943  BP: 140/84  Pulse: 100  SpO2: 96%    Affect appropriate Healthy:  appears stated age HEENT: normal Neck supple with no adenopathy JVP normal no bruits no thyromegaly Lungs clear with no wheezing and good diaphragmatic motion Heart:  S1/S2 no murmur, no rub, gallop or click PMI normal Abdomen: benighn, BS positve, no tenderness, no AAA no bruit.  No HSM or HJR Distal pulses intact with no bruits No edema Neuro non-focal Skin warm and dry No muscular weakness     Wt Readings from Last 3 Encounters:  08/14/17 194 lb 8 oz (88.2 kg)  07/31/17 201 lb 3.2 oz (91.3 kg)  01/27/17 193 lb (87.5 kg)    Lab Results  Component Value Date   WBC 5.8 01/02/2017   HGB 15.7 01/02/2017   HCT 45.3 01/02/2017   PLT 225.0 01/02/2017   GLUCOSE 88 01/02/2017   CHOL 121 01/27/2017   TRIG 65.0 01/27/2017   HDL 45.00 01/27/2017   LDLCALC 63 01/27/2017   ALT 33 01/27/2017   AST 23 01/27/2017   NA 139 01/02/2017   K 4.2 01/02/2017   CL 105 01/02/2017   CREATININE 0.97 01/02/2017   BUN 14 01/02/2017   CO2 29 01/02/2017   TSH 1.88 01/27/2017   PSA  1.03 01/27/2017   INR 1.05 10/19/2015   HGBA1C 5.6 06/25/2012    EKG: Normal sinus rhythm, no acute change  09/29/14     Impression:  CAD/CABG: LIMA LAD/D1 patent cath 2015 myovue 10/2015 anterior MI no ischemia EF 41% But echo in 10/23/15  showed normal EF.  MRI ordered f/u 11/2015 never done Consider f/u echo in a year Since no symptoms   ATrial Tachycardia: Sees [name] stable could have trial of beta blocker in future if needed  HTN:  Increase cozaar to 50 mg daily    Cholesterol: at goal labs with primary   Lab Results  Component Value Date   CHOL 121 01/27/2017   HDL 45.00 01/27/2017   LDLCALC 63 01/27/2017   TRIG 65.0 01/27/2017   CHOLHDL 3 01/27/2017    Jenkins Rouge

## 2017-08-14 ENCOUNTER — Ambulatory Visit: Payer: BLUE CROSS/BLUE SHIELD | Admitting: Cardiovascular Disease

## 2017-08-14 ENCOUNTER — Encounter: Payer: Self-pay | Admitting: Cardiovascular Disease

## 2017-08-14 VITALS — BP 140/84 | HR 100 | Ht 71.0 in | Wt 194.5 lb

## 2017-08-14 DIAGNOSIS — I10 Essential (primary) hypertension: Secondary | ICD-10-CM

## 2017-08-14 DIAGNOSIS — I2510 Atherosclerotic heart disease of native coronary artery without angina pectoris: Secondary | ICD-10-CM

## 2017-08-14 NOTE — Patient Instructions (Addendum)
Medication Instructions:  Your physician recommends that you continue on your current medications as directed. Please refer to the Current Medication list given to you today.  Labwork: NONE  Testing/Procedures: NONE  Follow-Up: Your physician recommends that you schedule a follow-up appointment in: 3 months with Dr. Curt Bears.  Your physician wants you to follow-up in: 6 months with Dr. Johnsie Cancel. You will receive a reminder letter in the mail two months in advance. If you don't receive a letter, please call our office to schedule the follow-up appointment.   If you need a refill on your cardiac medications before your next appointment, please call your pharmacy.

## 2017-08-19 ENCOUNTER — Ambulatory Visit: Payer: BLUE CROSS/BLUE SHIELD | Admitting: Adult Health

## 2017-08-19 ENCOUNTER — Telehealth: Payer: Self-pay | Admitting: *Deleted

## 2017-08-19 ENCOUNTER — Encounter: Payer: Self-pay | Admitting: Adult Health

## 2017-08-19 VITALS — BP 120/70 | Temp 97.7°F | Wt 198.0 lb

## 2017-08-19 DIAGNOSIS — H6503 Acute serous otitis media, bilateral: Secondary | ICD-10-CM | POA: Diagnosis not present

## 2017-08-19 DIAGNOSIS — H9313 Tinnitus, bilateral: Secondary | ICD-10-CM

## 2017-08-19 MED ORDER — FLUTICASONE PROPIONATE 50 MCG/ACT NA SUSP: 2.0000 | Freq: Every day | NASAL | 6 refills | Status: DC

## 2017-08-19 MED ORDER — LOSARTAN POTASSIUM 50 MG PO TABS: 50.0000 mg | ORAL_TABLET | Freq: Every day | ORAL | 3 refills | Status: DC

## 2017-08-19 NOTE — Progress Notes (Signed)
Subjective:    Patient ID: Terry Hansen, male    DOB: 04-19-1956, 62 y.o.   MRN: [id number]  HPI  62 year old male who  has a past medical history of CAD (coronary artery disease), GERD (gastroesophageal reflux disease), HLD (hyperlipidemia), Hypertension, Ocular migraine, and Unstable angina pectoris ([place]). He is a patient of Almira Coaster, NP. He presents to the office today for the acute complaint of tinnitus and the feeling of " pressure in bilateral ears". He denies any sinus pain or pressure but does endorse rhinorrhea. He describes his symptoms as " that feeling when you go up the mountains and you cannot clear your ears"   He denies fevers or feeling acutely ill   Review of Systems   See HPI   Past Medical History:  Diagnosis Date  . CAD (coronary artery disease)    a.  LHC 06/26/12:  pLAD 90%, prox/mid/dist CFS 20-30%, oRCA 100% (non dom), EF 55%;  b. Echo 12/13:  EF 55-60%, Gr 1 diast dysfn;  c. s/p CABG [facility]) 12/13: L-LAD/Dx    . GERD (gastroesophageal reflux disease)   . HLD (hyperlipidemia)   . Hypertension   . Ocular migraine    "maybe once q couple months" (06/02/2014)  . Unstable angina pectoris [facility])     Social History   Socioeconomic History  . Marital status: Divorced    Spouse name: Not on file  . Number of children: Not on file  . Years of education: Not on file  . Highest education level: Not on file  Social Needs  . Financial resource strain: Not on file  . Food insecurity - worry: Not on file  . Food insecurity - inability: Not on file  . Transportation needs - medical: Not on file  . Transportation needs - non-medical: Not on file  Occupational History  . Not on file  Tobacco Use  . Smoking status: Never Smoker  . Smokeless tobacco: Never Used  Substance and Sexual Activity  . Alcohol use: Yes    Alcohol/week: 0.6 oz    Types: 1 Glasses of wine per week  . Drug use: No  . Sexual activity: Not Currently  Other Topics Concern  .  Not on file  Social History Narrative   Divorced 7 years ago   3 children   Works in Aeronautical engineer    Past Surgical History:  Procedure Laterality Date  . APPENDECTOMY  1970's  . CARDIAC CATHETERIZATION  06/2012  . COLONOSCOPY  10-15 yrs ago   in [place]  . CORONARY ARTERY BYPASS GRAFT  06/29/2012   Procedure: CORONARY ARTERY BYPASS GRAFTING (CABG);  Surgeon: Melrose Nakayama, MD;  Location: [place];  Service: Open Heart Surgery;  Laterality: N/A;  times two using Left Internal Mammary Artery  . INGUINAL HERNIA REPAIR Bilateral [phone]  . LEFT HEART CATHETERIZATION WITH CORONARY ANGIOGRAM N/A 06/26/2012   Procedure: LEFT HEART CATHETERIZATION WITH CORONARY ANGIOGRAM;  Surgeon: Josue Hector, MD;  Location: [facility] CATH LAB;  Service: Cardiovascular;  Laterality: N/A;  . LEFT HEART CATHETERIZATION WITH CORONARY/GRAFT ANGIOGRAM N/A 06/03/2014   Procedure: LEFT HEART CATHETERIZATION WITH Beatrix Fetters;  Surgeon: Jettie Booze, MD;  Location[facility] CATH LAB;  Service: Cardiovascular;  Laterality: N/A;  . NASAL SEPTUM SURGERY     For uncontrolled epistaxis early 2013    Family History  Problem Relation Age of Onset  . Heart disease Brother        CABG age 60, redo  bypass ~50  . Heart disease Paternal Grandmother   . Heart disease Cousin        Maternal side  . Heart attack Brother   . Colon cancer Maternal Grandfather 60    No Known Allergies  Current Outpatient Medications on File Prior to Visit  Medication Sig Dispense Refill  . aspirin EC 81 MG tablet Take 1 tablet (81 mg total) by mouth daily.    Marland Kitchen atorvastatin (LIPITOR) 40 MG tablet TAKE 1 TABLET BY MOUTH EVERY DAY AT 6 PM 90 tablet 1  . Coenzyme Q10 200 MG capsule Take 200 mg by mouth every morning. disontinue while in hospital    . diltiazem (CARDIZEM CD) 300 MG 24 hr capsule Take 1 capsule (300 mg total) by mouth daily. 30 capsule 11  . diltiazem (CARDIZEM) 30 MG tablet Take 1 tablet (30 mg  total) by mouth 4 (four) times daily as needed (elevated heart rates). 30 tablet 3  . ibuprofen (ADVIL,MOTRIN) 200 MG tablet Take 600 mg by mouth every 6 (six) hours as needed. migraine headache    . losartan (COZAAR) 50 MG tablet Take 1 tablet (50 mg total) by mouth daily. 90 tablet 3  . nitroGLYCERIN (NITROSTAT) 0.4 MG SL tablet Place 1 tablet (0.4 mg total) under the tongue every 5 (five) minutes as needed for chest pain. 25 tablet 3   No current facility-administered medications on file prior to visit.     BP 120/70 (BP Location: Right Arm, Cuff Size: Large)   Temp 97.7 F (36.5 C) (Oral)   Wt 198 lb (89.8 kg)   BMI 27.62 kg/m       Objective:   Physical Exam  Constitutional: He is oriented to person, place, and time. He appears well-developed and well-nourished. No distress.  HENT:  Right Ear: Hearing, external ear and ear canal normal. Tympanic membrane is bulging. Tympanic membrane is not erythematous.  Left Ear: Hearing, external ear and ear canal normal. Tympanic membrane is bulging. Tympanic membrane is not erythematous.  Nose: Mucosal edema present. No rhinorrhea. Right sinus exhibits no maxillary sinus tenderness and no frontal sinus tenderness. Left sinus exhibits no maxillary sinus tenderness and no frontal sinus tenderness.  Mouth/Throat: Uvula is midline, oropharynx is clear and moist and mucous membranes are normal.  Cardiovascular: Normal rate, regular rhythm, normal heart sounds and intact distal pulses. Exam reveals no gallop and no friction rub.  No murmur heard. Pulmonary/Chest: Effort normal and breath sounds normal. No respiratory distress. He has no wheezes. He has no rales. He exhibits no tenderness.  Neurological: He is alert and oriented to person, place, and time.  Skin: Skin is warm and dry. No rash noted. He is not diaphoretic. No erythema. No pallor.  Psychiatric: He has a normal mood and affect. His behavior is normal. Judgment and thought content  normal.  Nursing note and vitals reviewed.     Assessment & Plan:  Will prescribe Flonase. His symptoms appear to be from seasonal allergies. No bacterial process noted.  - Follow up if not resolved in 2 weeks or sooner if needed   [name], NP

## 2017-08-19 NOTE — Telephone Encounter (Signed)
Patient called and stated that his losartan was increased to 50 mg qd recently but a new rx was not sent in. An order was never placed on his chart for this dose. Per phone note from 08/06/17  Constance Haw, MD  Cardiology   Conversation  (Newest Message First)  Richmond Campbell, LPN   07/31/29 5:94 PM  Note    Pt aware of recommendations .Pt has an appt next week with Dr Johnsie Cancel and will give an update at that time .Charlynn Grimes, Ocie Doyne, MD  to Stanton Kidney, RN   08/06/17 2:57 PM  Note    Should increase losartan to 50 mg.     Should this be ordered under Dr Curt Bears or Dr Johnsie Cancel? Please advise. Thanks, MI

## 2017-08-19 NOTE — Telephone Encounter (Signed)
Called patient about his losartan. Patient is suppose to be taking Losartan 50 mg by mouth daily. Sent in prescription. Patient was taking Losartan 25 mg and taking 2 tablets to equal 50 mg. Patient was not sure if he was going to tolerate Losartan 50 mg, therefore prescription was not sent in. Patient stated he is doing great on the losartan 50 mg dose, and was to continue with that dose after his office visit last week. Patient will call back with any other concerns.

## 2017-08-21 ENCOUNTER — Ambulatory Visit: Payer: BLUE CROSS/BLUE SHIELD | Admitting: Cardiology

## 2017-08-25 ENCOUNTER — Other Ambulatory Visit: Payer: Self-pay | Admitting: Family Medicine

## 2017-08-27 ENCOUNTER — Telehealth: Payer: Self-pay | Admitting: Adult Health

## 2017-08-27 NOTE — Telephone Encounter (Signed)
Pt called to check on the status of refill

## 2017-08-27 NOTE — Telephone Encounter (Signed)
Cory, did you accept this pt as a new pt?  You have not filled this medication in the past.  Please advise.

## 2017-08-27 NOTE — Telephone Encounter (Signed)
Copied from [place] [phone]. Topic: Quick Communication - See Telephone Encounter >> Aug 27, 2017  4:24 PM Boyd Kerbs wrote: CRM for notification. See Telephone encounter for:   Pt. Was in on 1/29, and was prescribed Flonase,  his ears are not getting any better.   Please call pt. And let him know what he can do.   [facility] [place], [address] [place] [address] Phone: [phone] Fax: [phone]   08/27/17.

## 2017-08-27 NOTE — Telephone Encounter (Signed)
Yes, I accepted him. Ok to refill 90 +1

## 2017-08-27 NOTE — Telephone Encounter (Signed)
Patient called with continued problem with ears being stopped up. He denies fever, headache. He was in the office on 08/19/17 and was prescribed flonase to use, he said that did not help. It was recommended him to follow up in 2 weeks if symptoms did not improve, appointment made for tomorrow with Dr. Volanda Napoleon.

## 2017-08-27 NOTE — Telephone Encounter (Signed)
Sent to the pharmacy by e-scribe. 

## 2017-08-28 ENCOUNTER — Ambulatory Visit: Payer: BLUE CROSS/BLUE SHIELD | Admitting: Family Medicine

## 2017-08-28 ENCOUNTER — Encounter: Payer: Self-pay | Admitting: Family Medicine

## 2017-08-28 VITALS — BP 132/80 | HR 90 | Temp 98.0°F | Wt 197.0 lb

## 2017-08-28 DIAGNOSIS — R0982 Postnasal drip: Secondary | ICD-10-CM | POA: Diagnosis not present

## 2017-08-28 DIAGNOSIS — H6983 Other specified disorders of Eustachian tube, bilateral: Secondary | ICD-10-CM

## 2017-08-28 MED ORDER — MOMETASONE FUROATE 50 MCG/ACT NA SUSP: 2.0000 | Freq: Every day | NASAL | 1 refills | Status: DC

## 2017-08-28 MED ORDER — FEXOFENADINE HCL 180 MG PO TABS: 180.0000 mg | ORAL_TABLET | Freq: Every day | ORAL | 2 refills | Status: DC

## 2017-08-28 NOTE — Patient Instructions (Addendum)
Eustachian Tube Dysfunction The eustachian tube connects the middle ear to the back of the nose. It regulates air pressure in the middle ear by allowing air to move between the ear and nose. It also helps to drain fluid from the middle ear space. When the eustachian tube does not function properly, air pressure, fluid, or both can build up in the middle ear. Eustachian tube dysfunction can affect one or both ears. What are the causes? This condition happens when the eustachian tube becomes blocked or cannot open normally. This may result from:  Ear infections.  Colds and other upper respiratory infections.  Allergies.  Irritation, such as from cigarette smoke or acid from the stomach coming up into the esophagus (gastroesophageal reflux).  Sudden changes in air pressure, such as from descending in an airplane.  Abnormal growths in the nose or throat, such as nasal polyps, tumors, or enlarged tissue at the back of the throat (adenoids).  What increases the risk? This condition may be more likely to develop in people who smoke and people who are overweight. Eustachian tube dysfunction may also be more likely to develop in children, especially children who have:  Certain birth defects of the mouth, such as cleft palate.  Large tonsils and adenoids.  What are the signs or symptoms? Symptoms of this condition may include:  A feeling of fullness in the ear.  Ear pain.  Clicking or popping noises in the ear.  Ringing in the ear.  Hearing loss.  Loss of balance.  Symptoms may get worse when the air pressure around you changes, such as when you travel to an area of high elevation or fly on an airplane. How is this diagnosed? This condition may be diagnosed based on:  Your symptoms.  A physical exam of your ear, nose, and throat.  Tests, such as those that measure: ? The movement of your eardrum (tympanogram). ? Your hearing (audiometry).  How is this treated? Treatment  depends on the cause and severity of your condition. If your symptoms are mild, you may be able to relieve your symptoms by moving air into ("popping") your ears. If you have symptoms of fluid in your ears, treatment may include:  Decongestants.  Antihistamines.  Nasal sprays or ear drops that contain medicines that reduce swelling (steroids).  In some cases, you may need to have a procedure to drain the fluid in your eardrum (myringotomy). In this procedure, a small tube is placed in the eardrum to:  Drain the fluid.  Restore the air in the middle ear space.  Follow these instructions at home:  Take over-the-counter and prescription medicines only as told by your health care provider.  Use techniques to help pop your ears as recommended by your health care provider. These may include: ? Chewing gum. ? Yawning. ? Frequent, forceful swallowing. ? Closing your mouth, holding your nose closed, and gently blowing as if you are trying to blow air out of your nose.  Do not do any of the following until your health care provider approves: ? Travel to high altitudes. ? Fly in airplanes. ? Work in a pressurized cabin or room. ? Scuba dive.  Keep your ears dry. Dry your ears completely after showering or bathing.  Do not smoke.  Keep all follow-up visits as told by your health care provider. This is important. Contact a health care provider if:  Your symptoms do not go away after treatment.  Your symptoms come back after treatment.  You are   unable to pop your ears.  You have: ? A fever. ? Pain in your ear. ? Pain in your head or neck. ? Fluid draining from your ear.  Your hearing suddenly changes.  You become very dizzy.  You lose your balance. This information is not intended to replace advice given to you by your health care provider. Make sure you discuss any questions you have with your health care provider. Document Released: 08/04/2015 Document Revised: 12/14/2015  Document Reviewed: 07/27/2014 Elsevier Interactive Patient Education  2018 [employer]  Postnasal Drip Postnasal drip is the feeling of mucus going down the back of your throat. Mucus is a slimy substance that moistens and cleans your nose and throat, as well as the air pockets in face bones near your forehead and cheeks (sinuses). Small amounts of mucus pass from your nose and sinuses down the back of your throat all the time. This is normal. When you produce too much mucus or the mucus gets too thick, you can feel it. Some common causes of postnasal drip include:  Having more mucus because of: ? A cold or the flu. ? Allergies. ? Cold air. ? Certain medicines.  Having more mucus that is thicker because of: ? A sinus or nasal infection. ? Dry air. ? A food allergy.  Follow these instructions at home: Relieving discomfort  Gargle with a salt-water mixture 3-4 times a day or as needed. To make a salt-water mixture, completely dissolve -1 tsp of salt in 1 cup of warm water.  If the air in your home is dry, use a humidifier to add moisture to the air.  Use a saline spray or container (neti pot) to flush out the nose (nasal irrigation). These methods can help clear away mucus and keep the nasal passages moist. General instructions  Take over-the-counter and prescription medicines only as told by your health care provider.  Follow instructions from your health care provider about eating or drinking restrictions. You may need to avoid caffeine.  Avoid things that you know you are allergic to (allergens), like dust, mold, pollen, pets, or certain foods.  Drink enough fluid to keep your urine pale yellow.  Keep all follow-up visits as told by your health care provider. This is important. Contact a health care provider if:  You have a fever.  You have a sore throat.  You have difficulty swallowing.  You have headache.  You have sinus pain.  You have a cough that does not go  away.  The mucus from your nose becomes thick and is green or yellow in color.  You have cold or flu symptoms that last more than 10 days. Summary  Postnasal drip is the feeling of mucus going down the back of your throat.  If your health care provider approves, use nasal irrigation or a nasal spray 2?4 times a day.  Avoid things that you know you are allergic to (allergens), like dust, mold, pollen, pets, or certain foods. This information is not intended to replace advice given to you by your health care provider. Make sure you discuss any questions you have with your health care provider. Document Released: 10/21/2016 Document Revised: 10/21/2016 Document Reviewed: 10/21/2016 Elsevier Interactive Patient Education  2018 Else[employer] Inc.  

## 2017-08-28 NOTE — Progress Notes (Signed)
Subjective:    Patient ID: Terry Hansen, male    DOB: Mar 29, 1956, 62 y.o.   MRN: [id number]  No chief complaint on file.   HPI Patient was seen today for acute concern.  Patient was recently seen on 1/29 dx'd w/ bilateral acute serous otitis media given Rx for Flonase.  Pt states he is still having ear pressure.  Pt does not feel like the flonase is helping at all.  Pt denies fever, chills, n/v HA, rhinorrhea.  Pt does endorse some throat irritation 2/2/ nasal drainage.  Past Medical History:  Diagnosis Date  . CAD (coronary artery disease)    a.  LHC 06/26/12:  pLAD 90%, prox/mid/dist CFS 20-30%, oRCA 100% (non dom), EF 55%;  b. Echo 12/13:  EF 55-60%, Gr 1 diast dysfn;  c. s/p CABG [facility] Hockey) 12/13: L-LAD/Dx    . GERD (gastroesophageal reflux disease)   . HLD (hyperlipidemia)   . Hypertension   . Ocular migraine    "maybe once q couple months" (06/02/2014)  . Unstable angina pectoris (HCC)     No Known Allergies  ROS General: Denies fever, chills, night sweats, changes in weight, changes in appetite HEENT: Denies headaches, changes in vision, rhinorrhea, sore throat  + ear pressure CV: Denies CP, palpitations, SOB, orthopnea Pulm: Denies SOB, cough, wheezing GI: Denies abdominal pain, nausea, vomiting, diarrhea, constipation GU: Denies dysuria, hematuria, frequency, vaginal discharge Msk: Denies muscle cramps, joint pains Neuro: Denies weakness, numbness, tingling Skin: Denies rashes, bruising Psych: Denies depression, anxiety, hallucinations     Objective:    Blood pressure 132/80, pulse 90, temperature 98 F (36.7 C), temperature source Oral, weight 197 lb (89.4 kg), SpO2 96 %.   Gen. Pleasant, well-nourished, in no distress, normal affect   HEENT: /AT, face symmetric, conjunctiva clear, no scleral icterus, PERRLA, nares patent without drainage, pharynx with clear post nasal drainage, mild erythema, no exudate.  TMs full bilaterally  Lungs: no accessory muscle use,  CTAB, no wheezes or rales Cardiovascular: RRR, no m/r/g, no peripheral edema Neuro:  A&Ox3, CN II-XII intact, normal gait Skin:  Warm, no lesions/ rash   Wt Readings from Last 3 Encounters:  08/28/17 197 lb (89.4 kg)  08/19/17 198 lb (89.8 kg)  08/14/17 194 lb 8 oz (88.2 kg)    Lab Results  Component Value Date   WBC 5.8 01/02/2017   HGB 15.7 01/02/2017   HCT 45.3 01/02/2017   PLT 225.0 01/02/2017   GLUCOSE 88 01/02/2017   CHOL 121 01/27/2017   TRIG 65.0 01/27/2017   HDL 45.00 01/27/2017   LDLCALC 63 01/27/2017   ALT 33 01/27/2017   AST 23 01/27/2017   NA 139 01/02/2017   K 4.2 01/02/2017   CL 105 01/02/2017   CREATININE 0.97 01/02/2017   BUN 14 01/02/2017   CO2 29 01/02/2017   TSH 1.88 01/27/2017   PSA 1.03 01/27/2017   INR 1.05 10/19/2015   HGBA1C 5.6 06/25/2012    Assessment/Plan:  Dysfunction of both eustachian tubes - Plan: fexofenadine (ALLEGRA) 180 MG tablet, mometasone (NASONEX) 50 MCG/ACT nasal spray  Post-nasal drainage - Plan: fexofenadine (ALLEGRA) 180 MG tablet, mometasone (NASONEX) 50 MCG/ACT nasal spray   F/u prn.  Grier Mitts, MD

## 2017-09-02 ENCOUNTER — Telehealth: Payer: Self-pay | Admitting: Family Medicine

## 2017-09-02 DIAGNOSIS — H6980 Other specified disorders of Eustachian tube, unspecified ear: Secondary | ICD-10-CM

## 2017-09-02 NOTE — Telephone Encounter (Signed)
Referral placed in epic.

## 2017-09-02 NOTE — Telephone Encounter (Signed)
Ok to refer?  Dx code?

## 2017-09-02 NOTE — Telephone Encounter (Signed)
[place] for referral   Dx: eustachian tube dysfunction

## 2017-09-02 NOTE — Telephone Encounter (Signed)
Copied from [place]. Topic: Referral - Request >> Sep 02, 2017 11:25 AM Ether Griffins B wrote: Reason for CRM: pt requesting referral for ENT due to still having trouble with ears being stopped up and pressure and fluid and some ringing in left ear.

## 2017-11-12 ENCOUNTER — Encounter: Payer: Self-pay | Admitting: Cardiology

## 2017-11-12 ENCOUNTER — Ambulatory Visit: Payer: BLUE CROSS/BLUE SHIELD | Admitting: Cardiology

## 2017-11-12 VITALS — BP 138/92 | HR 85 | Ht 71.0 in | Wt 200.4 lb

## 2017-11-12 DIAGNOSIS — I25810 Atherosclerosis of coronary artery bypass graft(s) without angina pectoris: Secondary | ICD-10-CM

## 2017-11-12 DIAGNOSIS — I471 Supraventricular tachycardia: Secondary | ICD-10-CM

## 2017-11-12 DIAGNOSIS — I10 Essential (primary) hypertension: Secondary | ICD-10-CM

## 2017-11-12 NOTE — Progress Notes (Signed)
Electrophysiology Office Note   Date:  11/12/2017   ID:  Terry Hansen, DOB 17-Dec-1955, MRN [id number]  PCP:  [name], NP  Cardiologist: Johnsie Cancel  Primary Electrophysiologist:  Dama Hedgepeth Meredith Leeds, MD    Chief Complaint  Patient presents with  . Follow-up    Atrial tachycardia     History of Present Illness: Terry Hansen is a 62 y.o. male who presents today for electrophysiology evaluation.   He presented to the hospital at the end of March with palpitations and was found to have an atrial tachycardia and multiple short runs of tachycardia. At that time, he was started on metoprolol 75 mg seems to control his symptoms. He re-presented to the hospital on 4/10 with similar symptoms.  His metoprolol was sent stopped and his diltiazem was increased due to fatigue.  Today, denies symptoms of palpitations, chest pain, shortness of breath, orthopnea, PND, lower extremity edema, claudication, dizziness, presyncope, syncope, bleeding, or neurologic sequela. The patient is tolerating medications without difficulties.  Overall he is feeling well.  He is felt much improved since switching from diltiazem to metoprolol.  He no longer has fatigue.  He has short episodes of palpitations, but they are not as bothersome.   Past Medical History:  Diagnosis Date  . CAD (coronary artery disease)    a.  LHC 06/26/12:  pLAD 90%, prox/mid/dist CFS 20-30%, oRCA 100% (non dom), EF 55%;  b. Echo 12/13:  EF 55-60%, Gr 1 diast dysfn;  c. s/p CABG [facility] Hockey) 12/13: L-LAD/Dx    . GERD (gastroesophageal reflux disease)   . HLD (hyperlipidemia)   . Hypertension   . Ocular migraine    "maybe once q couple months" (06/02/2014)  . Unstable angina pectoris [facility])    Past Surgical History:  Procedure Laterality Date  . APPENDECTOMY  1970's  . CARDIAC CATHETERIZATION  06/2012  . COLONOSCOPY  10-15 yrs ago   in [place]  . CORONARY ARTERY BYPASS GRAFT  06/29/2012   Procedure: CORONARY ARTERY BYPASS  GRAFTING (CABG);  Surgeon: Melrose Nakayama, MD;  Location: [facility];  Service: Open Heart Surgery;  Laterality: N/A;  times two using Left Internal Mammary Artery  . INGUINAL HERNIA REPAIR Bilateral [phone]  . LEFT HEART CATHETERIZATION WITH CORONARY ANGIOGRAM N/A 06/26/2012   Procedure: LEFT HEART CATHETERIZATION WITH CORONARY ANGIOGRAM;  Surgeon: Josue Hector, MD;  Location: [facility] CATH LAB;  Service: Cardiovascular;  Laterality: N/A;  . LEFT HEART CATHETERIZATION WITH CORONARY/GRAFT ANGIOGRAM N/A 06/03/2014   Procedure: LEFT HEART CATHETERIZATION WITH Beatrix Fetters;  Surgeon: Jettie Booze, MD;  Location: [facility] CATH LAB;  Service: Cardiovascular;  Laterality: N/A;  . NASAL SEPTUM SURGERY     For uncontrolled epistaxis early 2013     Current Outpatient Medications  Medication Sig Dispense Refill  . aspirin EC 81 MG tablet Take 1 tablet (81 mg total) by mouth daily.    Marland Kitchen atorvastatin (LIPITOR) 40 MG tablet TAKE 1 TABLET BY MOUTH EVERY DAY AT 6 PM 90 tablet 1  . Coenzyme Q10 200 MG capsule Take 200 mg by mouth every morning. disontinue while in hospital    . diltiazem (CARDIZEM CD) 300 MG 24 hr capsule Take 1 capsule (300 mg total) by mouth daily. 30 capsule 11  . diltiazem (CARDIZEM) 30 MG tablet Take 1 tablet (30 mg total) by mouth 4 (four) times daily as needed (elevated heart rates). 30 tablet 3  . fexofenadine (ALLEGRA) 180 MG tablet Take 1 tablet (180 mg total) by mouth  daily. 30 tablet 2  . fluticasone (FLONASE) 50 MCG/ACT nasal spray Place 2 sprays into both nostrils daily. 16 g 6  . ibuprofen (ADVIL,MOTRIN) 200 MG tablet Take 600 mg by mouth every 6 (six) hours as needed. migraine headache    . losartan (COZAAR) 50 MG tablet Take 1 tablet (50 mg total) by mouth daily. 90 tablet 3  . mometasone (NASONEX) 50 MCG/ACT nasal spray Place 2 sprays into the nose daily. 17 g 1  . nitroGLYCERIN (NITROSTAT) 0.4 MG SL tablet Place 1 tablet (0.4 mg total) under the tongue every 5  (five) minutes as needed for chest pain. 25 tablet 3   No current facility-administered medications for this visit.     Allergies:   Patient has no known allergies.   Social History:  The patient  reports that he has never smoked. He has never used smokeless tobacco. He reports that he drinks about 0.6 oz of alcohol per week. He reports that he does not use drugs.   Family History:  The patient's family history includes Colon cancer (age of onset: 62) in his maternal grandfather; Heart attack in his brother; Heart disease in his brother, cousin, and paternal grandmother.    ROS:  Please see the history of present illness.   Otherwise, review of systems is positive for ankle swelling.   All other systems are reviewed and negative.   PHYSICAL EXAM: VS:  BP (!) 138/92   Pulse 85   Ht 5\' 11"  (1.803 m)   Wt 200 lb 6.4 oz (90.9 kg)   SpO2 97%   BMI 27.95 kg/m  , BMI Body mass index is 27.95 kg/m. GEN: Well nourished, well developed, in no acute distress  HEENT: normal  Neck: no JVD, carotid bruits, or masses Cardiac: RRR; 2/6 systolic murmur at the base, no rubs, or gallops,no edema  Respiratory:  clear to auscultation bilaterally, normal work of breathing GI: soft, nontender, nondistended, + BS MS: no deformity or atrophy  Skin: warm and dry Neuro:  Strength and sensation are intact Psych: euthymic mood, full affect  EKG:  EKG is not ordered today. Personal review of the ekg ordered 07/31/17 shows SR, rate 52, iRBBB,   Recent Labs: 01/02/2017: BUN 14; Creatinine, Ser 0.97; Hemoglobin 15.7; Platelets 225.0; Potassium 4.2; Sodium 139 01/27/2017: ALT 33; TSH 1.88    Lipid Panel     Component Value Date/Time   CHOL 121 01/27/2017 1024   TRIG 65.0 01/27/2017 1024   HDL 45.00 01/27/2017 1024   CHOLHDL 3 01/27/2017 1024   VLDL 13.0 01/27/2017 1024   LDLCALC 63 01/27/2017 1024     Wt Readings from Last 3 Encounters:  11/12/17 200 lb 6.4 oz (90.9 kg)  08/28/17 197 lb (89.4 kg)    08/19/17 198 lb (89.8 kg)      Other studies Reviewed: Additional studies/ records that were reviewed today include: Nuclear stress 11/02/15  Review of the above records today demonstrates:   Nuclear stress EF: 41%.  There was no ST segment deviation noted during stress.  Defect 1: There is a large defect of moderate severity present in the mid anterior, mid inferolateral, apical anterior, apical lateral and apex location.  Findings consistent with prior anteriolateral myocardial infarction. There is no evidence of ischemia.  This is an intermediate risk study.  TTE 10/20/15 - Left ventricle: The cavity size was normal. Systolic function was  normal. The estimated ejection fraction was in the range of 60%  to 65%. Wall motion  was normal; there were no regional wall  motion abnormalities. - Aortic valve: Possibly bicuspid; severely thickened, severely  calcified leaflets. Valve mobility was restricted. There was mild  stenosis. There was mild regurgitation. Peak gradient (S): 33 mm  Hg. Valve area (VTI): 1.86 cm^2. Valve area (Vmax): 2.65 cm^2.  Valve area (Vmean): 2.67 cm^2. - Aortic root: The aortic root was normal in size. - Mitral valve: There was mild regurgitation. - Left atrium: The atrium was normal in size. - Right ventricle: Systolic function was normal. - Right atrium: The atrium was normal in size. - Tricuspid valve: There was mild regurgitation. - Pulmonary arteries: Systolic pressure was within the normal  range. - Inferior vena cava: The vessel was normal in size. - Pericardium, extracardiac: There was no pericardial effusion.  ASSESSMENT AND PLAN:  1.  Atrial tachycardia: Better controlled.  He was switched to diltiazem which is greatly improved his level of fatigue.  He is only having short episodes of palpitations.  No changes.  2.  Hypertension: Mildly elevated diastolic, but has been well controlled in the past.  No changes.  3.  Coronary artery  disease status post CABG: Ejection fraction normal.  No current chest pain.  No changes.  Current medicines are reviewed at length with the patient today.   The patient does not have concerns regarding his medicines.  The following changes were made today: None  Labs/ tests ordered today include:  No orders of the defined types were placed in this encounter.    Disposition:   FU with Hayden Mabin 12 months  Signed, Jeren Dufrane Meredith Leeds, MD  11/12/2017 9:44 AM     [facility] [address] [place] [address] [phone] (office) [phone] (fax)

## 2017-11-12 NOTE — Patient Instructions (Signed)
Medication Instructions:  Your physician recommends that you continue on your current medications as directed. Please refer to the Current Medication list given to you today.  Labwork: None ordered  Testing/Procedures: None ordered  Follow-Up: Your physician wants you to follow-up in: 1 year with Dr. [name].  You will receive a reminder letter in the mail two months in advance. If you don't receive a letter, please call our office to schedule the follow-up appointment.  * If you need a refill on your cardiac medications before your next appointment, please call your pharmacy.   *Please note that any paperwork needing to be filled out by the provider will need to be addressed at the front desk prior to seeing the provider. Please note that any FMLA, disability or other documents regarding health condition is subject to a $25.00 charge that must be received prior to completion of paperwork in the form of a money order or check.  Thank you for choosing [facility]!!   Keziah Drotar, RN [phone]        

## 2017-11-13 ENCOUNTER — Emergency Department (HOSPITAL_COMMUNITY)
Admission: EM | Admit: 2017-11-13 | Discharge: 2017-11-13 | Disposition: A | Discharge status: Home / Self Care | Payer: BLUE CROSS/BLUE SHIELD | Attending: Emergency Medicine | Admitting: Emergency Medicine

## 2017-11-13 ENCOUNTER — Other Ambulatory Visit: Payer: Self-pay

## 2017-11-13 ENCOUNTER — Encounter (HOSPITAL_COMMUNITY): Payer: Self-pay | Admitting: Emergency Medicine

## 2017-11-13 DIAGNOSIS — Z79899 Other long term (current) drug therapy: Secondary | ICD-10-CM | POA: Diagnosis not present

## 2017-11-13 DIAGNOSIS — Z8673 Personal history of transient ischemic attack (TIA), and cerebral infarction without residual deficits: Secondary | ICD-10-CM | POA: Diagnosis not present

## 2017-11-13 DIAGNOSIS — I471 Supraventricular tachycardia: Secondary | ICD-10-CM | POA: Insufficient documentation

## 2017-11-13 DIAGNOSIS — E785 Hyperlipidemia, unspecified: Secondary | ICD-10-CM | POA: Diagnosis not present

## 2017-11-13 DIAGNOSIS — R55 Syncope and collapse: Secondary | ICD-10-CM | POA: Diagnosis not present

## 2017-11-13 DIAGNOSIS — Z7982 Long term (current) use of aspirin: Secondary | ICD-10-CM | POA: Diagnosis not present

## 2017-11-13 DIAGNOSIS — I2510 Atherosclerotic heart disease of native coronary artery without angina pectoris: Secondary | ICD-10-CM | POA: Diagnosis not present

## 2017-11-13 DIAGNOSIS — I10 Essential (primary) hypertension: Secondary | ICD-10-CM | POA: Diagnosis not present

## 2017-11-13 LAB — BASIC METABOLIC PANEL
Anion gap: 9 (ref 5–15)
BUN: 10 mg/dL (ref 6–20)
CALCIUM: 9.1 mg/dL (ref 8.9–10.3)
CO2: 24 mmol/L (ref 22–32)
Chloride: 102 mmol/L (ref 101–111)
Creatinine, Ser: 1.01 mg/dL (ref 0.61–1.24)
GFR calc non Af Amer: >60 mL/min (ref >60)
Glucose, Bld: 110 mg/dL — ABNORMAL HIGH (ref 65–99)
Potassium: 4.3 mmol/L (ref 3.5–5.1)
SODIUM: 135 mmol/L (ref 135–145)

## 2017-11-13 LAB — CBC
HCT: 46 % (ref 39.0–52.0)
Hemoglobin: 15.9 g/dL (ref 13.0–17.0)
MCH: 29.8 pg (ref 26.0–34.0)
MCHC: 34.6 g/dL (ref 30.0–36.0)
MCV: 86.3 fL (ref 78.0–100.0)
PLATELETS: 214 10*3/uL (ref 150–400)
RBC: 5.33 MIL/uL (ref 4.22–5.81)
RDW: 12.6 % (ref 11.5–15.5)
WBC: 5.9 10*3/uL (ref 4.0–10.5)

## 2017-11-13 LAB — URINALYSIS, ROUTINE W REFLEX MICROSCOPIC
BILIRUBIN URINE: NEGATIVE
Bacteria, UA: NONE SEEN
GLUCOSE, UA: NEGATIVE mg/dL
KETONES UR: NEGATIVE mg/dL
LEUKOCYTES UA: NEGATIVE
Nitrite: NEGATIVE
Protein, ur: NEGATIVE mg/dL
Specific Gravity, Urine: 1.015 (ref 1.005–1.030)
pH: 6 (ref 5.0–8.0)

## 2017-11-13 LAB — I-STAT TROPONIN, ED: TROPONIN I, POC: 0 ng/mL (ref 0.00–0.08)

## 2017-11-13 NOTE — ED Provider Notes (Signed)
[facility] EMERGENCY DEPARTMENT Provider Note   CSN: [id number] Arrival date & time: 11/13/17  6712     History   Chief Complaint Chief Complaint  Patient presents with  . Near Syncope    HPI Terry Hansen is a 62 y.o. male.  Patient with history of CABG in 2013, atrial tachycardia currently on Cardizem, mild atrial stenosis and regurg on echo in 2017 --presents with complaint of near syncope x2 occurring this morning.  First episode occurred without warning while brushing his teeth.  He states it felt like his heart skipped a beat and he had tunnel vision.  He was able to sit down on his bed where he slowly recovered over the next couple of minutes.  He felt better and try to go to work.  He had another episode while driving in his car.  No full syncope again and recovered after a few minutes.  He decided to come to the emergency department.  No associated diaphoresis, chest pain, shortness of breath.  The symptoms were different than the fluttering that he gets with his tachycardic spells.  He has never had symptoms like this today.  No associated fevers, cough, nausea, vomiting, diarrhea.  No leg swelling.  The onset of this condition was acute. The course is resolved. Aggravating factors: none. Alleviating factors: none. Cardiologist: Dr. Johnsie Cancel. Saw EP doc yesterday.       Past Medical History:  Diagnosis Date  . CAD (coronary artery disease)    a.  LHC 06/26/12:  pLAD 90%, prox/mid/dist CFS 20-30%, oRCA 100% (non dom), EF 55%;  b. Echo 12/13:  EF 55-60%, Gr 1 diast dysfn;  c. s/p CABG [facility]) 12/13: L-LAD/Dx    . GERD (gastroesophageal reflux disease)   . HLD (hyperlipidemia)   . Hypertension   . Ocular migraine    "maybe once q couple months" (06/02/2014)  . Unstable angina pectoris [facility])     Patient Active Problem List   Diagnosis Date Noted  . Ocular migraine   . Hypertension   . HLD (hyperlipidemia)   . Atopic dermatitis 01/27/2017  . PAT  (paroxysmal atrial tachycardia) ([place]) 10/22/2015  . Palpitations 10/19/2015  . Chronotropic incompetence with sinus node dysfunction ([facility]) 10/19/2015  . Anxiety and depression 10/24/2014  . Unstable angina pectoris ([place])   . Chest pain, mid sternal 06/02/2014  . Preventative health care 01/31/2014  . GERD (gastroesophageal reflux disease) 01/31/2014  . HTN (hypertension) 03/01/2013  . TIA (transient ischemic attack) 08/12/2012  . Precordial pain 08/12/2012  . CAD (coronary artery disease) 06/27/2012  . Migraine 06/26/2012  . Mixed hyperlipidemia 06/26/2012    Past Surgical History:  Procedure Laterality Date  . APPENDECTOMY  1970's  . CARDIAC CATHETERIZATION  06/2012  . COLONOSCOPY  10-15 yrs ago   in [place]  . CORONARY ARTERY BYPASS GRAFT  06/29/2012   Procedure: CORONARY ARTERY BYPASS GRAFTING (CABG);  Surgeon: Melrose Nakayama, MD;  Location: [facility];  Service: Open Heart Surgery;  Laterality: N/A;  times two using Left Internal Mammary Artery  . INGUINAL HERNIA REPAIR Bilateral [phone]  . LEFT HEART CATHETERIZATION WITH CORONARY ANGIOGRAM N/A 06/26/2012   Procedure: LEFT HEART CATHETERIZATION WITH CORONARY ANGIOGRAM;  Surgeon: Josue Hector, MD;  Location[facility] LAB;  Service: Cardiovascular;  Laterality: N/A;  . LEFT HEART CATHETERIZATION WITH CORONARY/GRAFT ANGIOGRAM N/A 06/03/2014   Procedure: LEFT HEART CATHETERIZATION WITH Beatrix Fetters;  Surgeon: Jettie Booze, MD;  Location: [facility] CATH LAB;  Service:  Cardiovascular;  Laterality: N/A;  . NASAL SEPTUM SURGERY     For uncontrolled epistaxis early 2013        Home Medications    Prior to Admission medications   Medication Sig Start Date End Date Taking? Authorizing Provider  aspirin EC 81 MG tablet Take 1 tablet (81 mg total) by mouth daily. 07/21/12   Richardson Dopp T, PA-C  atorvastatin (LIPITOR) 40 MG tablet TAKE 1 TABLET BY MOUTH EVERY DAY AT 6 PM 08/27/17   Nafziger, Tommi Rumps, NP  Coenzyme Q10  200 MG capsule Take 200 mg by mouth every morning. disontinue while in hospital    [provider]  diltiazem (CARDIZEM CD) 300 MG 24 hr capsule Take 1 capsule (300 mg total) by mouth daily. 07/31/17   [name], Ocie Doyne, MD  diltiazem (CARDIZEM) 30 MG tablet Take 1 tablet (30 mg total) by mouth 4 (four) times daily as needed (elevated heart rates). 08/04/17   Josue Hector, MD  fexofenadine (ALLEGRA) 180 MG tablet Take 1 tablet (180 mg total) by mouth daily. 08/28/17   Billie Ruddy, MD  fluticasone (FLONASE) 50 MCG/ACT nasal spray Place 2 sprays into both nostrils daily. 08/19/17   Nafziger, Tommi Rumps, NP  ibuprofen (ADVIL,MOTRIN) 200 MG tablet Take 600 mg by mouth every 6 (six) hours as needed. migraine headache    [provider]  losartan (COZAAR) 50 MG tablet Take 1 tablet (50 mg total) by mouth daily. 08/19/17 08/19/18  Josue Hector, MD  mometasone (NASONEX) 50 MCG/ACT nasal spray Place 2 sprays into the nose daily. 08/28/17   Billie Ruddy, MD  nitroGLYCERIN (NITROSTAT) 0.4 MG SL tablet Place 1 tablet (0.4 mg total) under the tongue every 5 (five) minutes as needed for chest pain. 11/29/13   Josue Hector, MD    Family History Family History  Problem Relation Age of Onset  . Heart disease Brother        CABG age 42, redo bypass ~50  . Heart disease Paternal Grandmother   . Heart disease Cousin        Maternal side  . Heart attack Brother   . Colon cancer Maternal Grandfather 53    Social History Social History   Tobacco Use  . Smoking status: Never Smoker  . Smokeless tobacco: Never Used  Substance Use Topics  . Alcohol use: Yes    Alcohol/week: 0.6 oz    Types: 1 Glasses of wine per week  . Drug use: No     Allergies   Patient has no known allergies.   Review of Systems Review of Systems  Constitutional: Negative for diaphoresis and fever.  Eyes: Negative for redness.  Respiratory: Negative for cough and shortness of breath.   Cardiovascular:  Positive for palpitations. Negative for chest pain and leg swelling.  Gastrointestinal: Negative for abdominal pain, nausea and vomiting.  Genitourinary: Negative for dysuria.  Musculoskeletal: Negative for back pain and neck pain.  Skin: Negative for rash.  Neurological: Positive for syncope (near syncope) and light-headedness.  Psychiatric/Behavioral: The patient is not nervous/anxious.      Physical Exam Updated Vital Signs BP (!) 151/84 (BP Location: Right Arm)   Pulse 79   Temp 98.2 F (36.8 C) (Oral)   Resp 15   SpO2 97%   Physical Exam  Constitutional: He appears well-developed and well-nourished.  HENT:  Head: Normocephalic and atraumatic.  Mouth/Throat: Mucous membranes are normal. Mucous membranes are not dry.  Eyes: Conjunctivae are normal.  Neck:  Trachea normal and normal range of motion. Neck supple. Normal carotid pulses and no JVD present. No muscular tenderness present. Carotid bruit is not present. No tracheal deviation present.  Cardiovascular: Normal rate, regular rhythm, S1 normal, S2 normal and intact distal pulses. Exam reveals no distant heart sounds and no decreased pulses.  Murmur (II/VI R USB) heard. Pulmonary/Chest: Effort normal and breath sounds normal. No respiratory distress. He has no wheezes. He exhibits no tenderness.  Abdominal: Soft. Normal aorta and bowel sounds are normal. There is no tenderness. There is no rebound and no guarding.  Musculoskeletal: He exhibits no edema.  Neurological: He is alert.  Skin: Skin is warm and dry. He is not diaphoretic. No cyanosis. No pallor.  Psychiatric: He has a normal mood and affect.  Nursing note and vitals reviewed.    ED Treatments / Results  Labs (all labs ordered are listed, but only abnormal results are displayed) Labs Reviewed  BASIC METABOLIC PANEL - Abnormal; Notable for the following components:      Result Value   Glucose, Bld 110 (*)    All other components within normal limits    URINALYSIS, ROUTINE W REFLEX MICROSCOPIC - Abnormal; Notable for the following components:   Hgb urine dipstick SMALL (*)    All other components within normal limits  CBC  I-STAT TROPONIN, ED  CBG MONITORING, ED    ED ECG REPORT   Date: 11/13/2017  Rate: 92  Rhythm: normal sinus rhythm  QRS Axis: normal  Intervals: normal  ST/T Wave abnormalities: normal  Conduction Disutrbances: incomplete right bundle branch block  Narrative Interpretation:   Old EKG Reviewed: unchanged except faster today  I have personally reviewed the EKG tracing and agree with the computerized printout as noted.  Radiology No results found.  Procedures Procedures (including critical care time)  Medications Ordered in ED Medications - No data to display   Initial Impression / Assessment and Plan / ED Course  I have reviewed the triage vital signs and the nursing notes.  Pertinent labs & imaging results that were available during my care of the patient were reviewed by me and considered in my medical decision making (see chart for details).     Patient seen and examined. Reviewed EKG, reviewed work-up with patient.   Vital signs reviewed and are as follows: BP (!) 151/84 (BP Location: Right Arm)   Pulse 79   Temp 98.2 F (36.8 C) (Oral)   Resp 15   SpO2 97%   6:48 PM Discussed case with Dr. Tomi Bamberger.  Discussed case with Dr. Debara Pickett of cardiology by phone. He does not feel like patient requires hospitalization or repeat echo emergently.   He advises to have patient increase diltiazem from 300 mg to 360 mg in the morning.  He will send message to patient's electrophysiologist to obtain close follow-up.  Patient updated on this discussion.  Encouraged patient to return to the emergency department immediately if he has another episode of near syncope or full syncope.  Encouraged return if he develops chest pain, shortness of breath, new symptoms or other concerns.  He verbalizes understanding and  agrees with plan.  Final Clinical Impressions(s) / ED Diagnoses   Final diagnoses:  Near syncope   Patient with 2 near syncopal episodes this morning.  He does have previous cardiac history.  Work-up in the ED including EKG, trop is reassuring.  He has been in the ED for > 9 hrs without recurrent symptoms. I spoke with cardiology regarding  the patient's history and presentation.  Advice as above. Plan: home with close EP f/u.   ED Discharge Orders    None       Carlisle Cater, Hershal Coria 11/13/17 1936    Dorie Rank, MD 11/14/17 1415

## 2017-11-13 NOTE — Discharge Instructions (Signed)
Please read and follow all provided instructions.  Your diagnoses today include:  1. Near syncope     Tests performed today include:  An EKG of your heart - no concerning findings  Cardiac enzymes - a blood test for heart muscle damage  Blood counts and electrolytes  Vital signs. See below for your results today.   Medications prescribed:   None  Take any prescribed medications only as directed.  Follow-up instructions: Please follow-up with Dr. Curt Bears for these symptoms.   I spoke with Dr. Debara Pickett tonight. He requests you increase diltiazem from 300mg  to 360mg  in the morning for the time being. He will send Dr. Curt Bears a message to have them follow-up with you.    Return instructions:  SEEK IMMEDIATE MEDICAL ATTENTION IF:  You have severe chest pain, especially if the pain is crushing or pressure-like and spreads to the arms, back, neck, or jaw, or if you have sweating, nausea (feeling sick to your stomach), or shortness of breath. THIS IS AN EMERGENCY. Don't wait to see if the pain will go away. Get medical help at once. Call 911 or 0 (operator). DO NOT drive yourself to the hospital.   Your chest pain gets worse and does not go away with rest.   You have an attack of chest pain lasting longer than usual, despite rest and treatment with the medications your caregiver has prescribed.   You wake from sleep with chest pain or shortness of breath.  You have additional episodes where you almost pass out or fully pass out.   You have chest pain not typical of your usual pain for which you originally saw your caregiver.   You have any other emergent concerns regarding your health.  Your vital signs today were: BP (!) 151/84 (BP Location: Right Arm)    Pulse 79    Temp 98.2 F (36.8 C) (Oral)    Resp 15    SpO2 97%  If your blood pressure (BP) was elevated above 135/85 this visit, please have this repeated by your doctor within one month. --------------

## 2017-11-13 NOTE — ED Triage Notes (Signed)
Patient states he has multiple episodes where he suddenly feels very weak and feels like he will pass out. Reports seeing electrophysiologist yesterday. Denies pain, SOB, nausea.

## 2017-11-13 NOTE — Progress Notes (Signed)
Called regarding patient of Dr. Johnsie Cancel and Curt Bears (seen yesterday by [facility]) - presented with 2 near syncopal episodes. History of atrial tachycardia. Was on metoprolol, now on diltiazem 300 mg daily. Also has short-acting for breakthrough. I suspect these episodes represent recurrent atrial tachycardia. Troponin negative.  Would recommend increasing diltiazem to 360 mg daily and follow-up with Dr. Curt Bears. May need loop recorder or comprehensive EP study.   Pixie Casino, MD, [facility], [place] Director of the [facility] Lipid Disorders &  Cardiovascular Risk Reduction Clinic Diplomate of the American Board of Clinical Lipidology Attending Cardiologist  Direct Dial: [phone]  Fax: [phone]  Website:  [email or web address]

## 2017-11-14 ENCOUNTER — Telehealth: Payer: Self-pay | Admitting: Cardiology

## 2017-11-14 DIAGNOSIS — R079 Chest pain, unspecified: Secondary | ICD-10-CM

## 2017-11-14 NOTE — Telephone Encounter (Signed)
[name], Ocie Doyne, MD  Stanton Kidney, RN        Looks like he almost passed out. Had diltiazem increased while in the ER. Can get a 30 day monitor or hold off and see if this happens again with increased dose of diltiazem.

## 2017-11-14 NOTE — Telephone Encounter (Signed)
Spoke with patient. Pt is going to hold off on monitor for now.  He is going to see how he does on the increased medication over the weekend. Pt and I agreed to speak Monday to see how his weekend went. Advised to call the office back/go to ED if he experiences near syncope/syncope again.  He will also take his blood pressure if he has any symptoms.

## 2017-11-14 NOTE — Telephone Encounter (Signed)
Pt c/o Syncope: STAT if syncope occurred within 30 minutes and pt complains of lightheadedness [place] if episode of passing out, completely, today or in last 24 hours   1. Did you pass out today? no  2. When is the last time you passed out? no  3. Has this occurred multiple times? yes   4. Did you have any symptoms prior to passing out? Pt went to the ER 11/13/2017 yesterday morning, he has tachycardia and he felt like he was going to pass out   They increased his medication and he is calling to see what is the next step of care, pt got lightheaded it happened twice yesterday before leaving the house for work and in the car on the way to work

## 2017-11-16 ENCOUNTER — Emergency Department (HOSPITAL_COMMUNITY): Payer: BLUE CROSS/BLUE SHIELD

## 2017-11-16 ENCOUNTER — Emergency Department (HOSPITAL_COMMUNITY)
Admission: EM | Admit: 2017-11-16 | Discharge: 2017-11-16 | Disposition: A | Discharge status: Home / Self Care | Payer: BLUE CROSS/BLUE SHIELD | Attending: Emergency Medicine | Admitting: Emergency Medicine

## 2017-11-16 ENCOUNTER — Encounter (HOSPITAL_COMMUNITY): Payer: Self-pay | Admitting: Emergency Medicine

## 2017-11-16 ENCOUNTER — Telehealth: Payer: Self-pay | Admitting: Physician Assistant

## 2017-11-16 DIAGNOSIS — I25110 Atherosclerotic heart disease of native coronary artery with unstable angina pectoris: Secondary | ICD-10-CM | POA: Diagnosis not present

## 2017-11-16 DIAGNOSIS — Z7982 Long term (current) use of aspirin: Secondary | ICD-10-CM | POA: Diagnosis not present

## 2017-11-16 DIAGNOSIS — R000 Tachycardia, unspecified: Secondary | ICD-10-CM | POA: Diagnosis not present

## 2017-11-16 DIAGNOSIS — Z79899 Other long term (current) drug therapy: Secondary | ICD-10-CM | POA: Insufficient documentation

## 2017-11-16 DIAGNOSIS — Z951 Presence of aortocoronary bypass graft: Secondary | ICD-10-CM | POA: Diagnosis not present

## 2017-11-16 DIAGNOSIS — R079 Chest pain, unspecified: Secondary | ICD-10-CM | POA: Diagnosis not present

## 2017-11-16 DIAGNOSIS — Z8673 Personal history of transient ischemic attack (TIA), and cerebral infarction without residual deficits: Secondary | ICD-10-CM | POA: Diagnosis not present

## 2017-11-16 DIAGNOSIS — R002 Palpitations: Secondary | ICD-10-CM | POA: Insufficient documentation

## 2017-11-16 LAB — CBC
HEMATOCRIT: 45.7 % (ref 39.0–52.0)
HEMOGLOBIN: 16.2 g/dL (ref 13.0–17.0)
MCH: 30.4 pg (ref 26.0–34.0)
MCHC: 35.4 g/dL (ref 30.0–36.0)
MCV: 85.7 fL (ref 78.0–100.0)
Platelets: 228 10*3/uL (ref 150–400)
RBC: 5.33 MIL/uL (ref 4.22–5.81)
RDW: 12.5 % (ref 11.5–15.5)
WBC: 8.5 10*3/uL (ref 4.0–10.5)

## 2017-11-16 LAB — BASIC METABOLIC PANEL
ANION GAP: 11 (ref 5–15)
BUN: 11 mg/dL (ref 6–20)
CO2: 26 mmol/L (ref 22–32)
Calcium: 8.7 mg/dL — ABNORMAL LOW (ref 8.9–10.3)
Chloride: 100 mmol/L — ABNORMAL LOW (ref 101–111)
Creatinine, Ser: 0.96 mg/dL (ref 0.61–1.24)
GFR calc Af Amer: >60 mL/min (ref >60)
GLUCOSE: 114 mg/dL — AB (ref 65–99)
POTASSIUM: 3.4 mmol/L — AB (ref 3.5–5.1)
Sodium: 137 mmol/L (ref 135–145)

## 2017-11-16 LAB — I-STAT TROPONIN, ED: Troponin i, poc: 0 ng/mL (ref 0.00–0.08)

## 2017-11-16 NOTE — Consult Note (Addendum)
Cardiology Consult    Patient ID: Terry Hansen MRN: [id number], DOB/AGE: Oct 30, 1955   Admit date: 11/16/2017 Date of Consult: 11/16/2017  Primary Physician: [name], NP Primary Cardiologist: No primary care provider on file. Requesting Provider: Dr. Vanita Panda  Patient Profile    Terry Hansen is a 62 y.o. male with a history of CAD, HL, HTN, atrial tachycardia, who is being seen today for the evaluation of palpitations at the request of Dr. Vanita Panda.  Past Medical History   Past Medical History:  Diagnosis Date  . CAD (coronary artery disease)    a.  LHC 06/26/12:  pLAD 90%, prox/mid/dist CFS 20-30%, oRCA 100% (non dom), EF 55%;  b. Echo 12/13:  EF 55-60%, Gr 1 diast dysfn;  c. s/p CABG [facility]) 12/13: L-LAD/Dx    . GERD (gastroesophageal reflux disease)   . HLD (hyperlipidemia)   . Hypertension   . Ocular migraine    "maybe once q couple months" (06/02/2014)  . Unstable angina pectoris [facility])     Past Surgical History:  Procedure Laterality Date  . APPENDECTOMY  1970's  . CARDIAC CATHETERIZATION  06/2012  . COLONOSCOPY  10-15 yrs ago   in [place]  . CORONARY ARTERY BYPASS GRAFT  06/29/2012   Procedure: CORONARY ARTERY BYPASS GRAFTING (CABG);  Surgeon: Melrose Nakayama, MD;  Location: [place];  Service: Open Heart Surgery;  Laterality: N/A;  times two using Left Internal Mammary Artery  . INGUINAL HERNIA REPAIR Bilateral [phone]  . LEFT HEART CATHETERIZATION WITH CORONARY ANGIOGRAM N/A 06/26/2012   Procedure: LEFT HEART CATHETERIZATION WITH CORONARY ANGIOGRAM;  Surgeon: Josue Hector, MD;  Location: [facility] CATH LAB;  Service: Cardiovascular;  Laterality: N/A;  . LEFT HEART CATHETERIZATION WITH CORONARY/GRAFT ANGIOGRAM N/A 06/03/2014   Procedure: LEFT HEART CATHETERIZATION WITH Beatrix Fetters;  Surgeon: Jettie Booze, MD;  Location: [facility] CATH LAB;  Service: Cardiovascular;  Laterality: N/A;  . NASAL SEPTUM SURGERY     For uncontrolled epistaxis  early 2013     Allergies  No Known Allergies  History of Present Illness    Terry Hansen is a 62 y.o. male with a history of CAD s/p CABG, HL, HTN, atrial tachycardia, who is being seen today for the evaluation of palpitations.  The patient has notably been seen twice recently for similar symptoms. In early April he was transitioned from metoprolol to diltiazem due to significant fatigue. He was then evaluated on 4/24 in clinic with EP at which time he noted short episodes of palpitations that were not significantly bothersome. Plan at that time was to continue without change. He was then seen in the ED on 4/25 due to increased palpitations and two episodes of pre-syncope. Diltiazem was increased to 360mg  with plan for close follow-up. The patient again presents to the ED today due to palpitations. He reports that they wake him up from sleep at night. With palpitations he does feel some mild discomfort in his chest which resolves as soon as the palpitations resolve. Reports palpitations come and go but happened several times this afternoon which is why he presented today. During our discussion, he noted symptoms and monitor revealed frequent PACs. After palpitations, he feels very fatigued. He otherwise denies shortness of breath, fever, chills, nausea, vomiting, LEE.  Outpatient Medications    No current facility-administered medications on file prior to encounter.    Current Outpatient Medications on File Prior to Encounter  Medication Sig Dispense Refill  . aspirin EC 81 MG tablet Take 1 tablet (  81 mg total) by mouth daily.    Marland Kitchen atorvastatin (LIPITOR) 40 MG tablet TAKE 1 TABLET BY MOUTH EVERY DAY AT 6 PM 90 tablet 1  . Coenzyme Q10 200 MG capsule Take 200 mg by mouth every morning. disontinue while in hospital    . diltiazem (CARDIZEM CD) 300 MG 24 hr capsule Take 1 capsule (300 mg total) by mouth daily. 30 capsule 11  . diltiazem (CARDIZEM) 30 MG tablet Take 1 tablet (30 mg total) by mouth 4  (four) times daily as needed (elevated heart rates). 30 tablet 3  . fexofenadine (ALLEGRA) 180 MG tablet Take 1 tablet (180 mg total) by mouth daily. 30 tablet 2  . fluticasone (FLONASE) 50 MCG/ACT nasal spray Place 2 sprays into both nostrils daily. 16 g 6  . ibuprofen (ADVIL,MOTRIN) 200 MG tablet Take 600 mg by mouth every 6 (six) hours as needed. migraine headache    . losartan (COZAAR) 50 MG tablet Take 1 tablet (50 mg total) by mouth daily. 90 tablet 3  . mometasone (NASONEX) 50 MCG/ACT nasal spray Place 2 sprays into the nose daily. 17 g 1  . nitroGLYCERIN (NITROSTAT) 0.4 MG SL tablet Place 1 tablet (0.4 mg total) under the tongue every 5 (five) minutes as needed for chest pain. 25 tablet 3     Family History    Family History  Problem Relation Age of Onset  . Heart disease Brother        CABG age 62, redo bypass ~50  . Heart disease Paternal Grandmother   . Heart disease Cousin        Maternal side  . Heart attack Brother   . Colon cancer Maternal Grandfather 79   indicated that his mother is deceased. He indicated that his father is deceased. He indicated that the status of his maternal grandfather is unknown. He indicated that the status of his paternal grandmother is unknown. He indicated that the status of his cousin is unknown.   Social History    Social History   Socioeconomic History  . Marital status: Divorced    Spouse name: Not on file  . Number of children: Not on file  . Years of education: Not on file  . Highest education level: Not on file  Occupational History  . Not on file  Social Needs  . Financial resource strain: Not on file  . Food insecurity:    Worry: Not on file    Inability: Not on file  . Transportation needs:    Medical: Not on file    Non-medical: Not on file  Tobacco Use  . Smoking status: Never Smoker  . Smokeless tobacco: Never Used  Substance and Sexual Activity  . Alcohol use: Yes    Alcohol/week: 0.6 oz    Types: 1 Glasses of  wine per week  . Drug use: No  . Sexual activity: Not Currently  Lifestyle  . Physical activity:    Days per week: Not on file    Minutes per session: Not on file  . Stress: Not on file  Relationships  . Social connections:    Talks on phone: Not on file    Gets together: Not on file    Attends religious service: Not on file    Active member of club or organization: Not on file    Attends meetings of clubs or organizations: Not on file    Relationship status: Not on file  . Intimate partner violence:    Fear  of current or ex partner: Not on file    Emotionally abused: Not on file    Physically abused: Not on file    Forced sexual activity: Not on file  Other Topics Concern  . Not on file  Social History Narrative   Divorced 7 years ago   3 children   Works in Aeronautical engineer     Review of Systems    General:  No chills, fever, night sweats or weight changes.  Cardiovascular:  As per HPI Dermatological: No rash, lesions/masses Respiratory: No cough, dyspnea Urologic: No hematuria, dysuria Abdominal:   No nausea, vomiting, diarrhea, bright red blood per rectum, melena, or hematemesis Neurologic:  No visual changes, wkns, changes in mental status. All other systems reviewed and are otherwise negative except as noted above.  Physical Exam    Blood pressure 136/90, pulse 78, temperature 98 F (36.7 C), temperature source Oral, resp. rate (!) 21, SpO2 97 %.  General: Pleasant, NAD Psych: Normal affect. Neuro: Alert and oriented X 3. Moves all extremities spontaneously. HEENT: Normal  Neck: Supple without bruits or JVD. Lungs:  Resp regular and unlabored, CTA. Heart: RRR no s3, s4, or murmurs. Abdomen: Soft, non-tender, non-distended, BS + x 4.  Extremities: No clubbing, cyanosis. Trace pitting edema. DP/PT/Radials 2+ and equal bilaterally.  Labs    Troponin [facility] of Care Test) Recent Labs    11/16/17 1341  TROPIPOC 0.00   No results for input(s):  CKTOTAL, CKMB, TROPONINI in the last 72 hours. Lab Results  Component Value Date   WBC 8.5 11/16/2017   HGB 16.2 11/16/2017   HCT 45.7 11/16/2017   MCV 85.7 11/16/2017   PLT 228 11/16/2017    Recent Labs  Lab 11/16/17 1338  NA 137  K 3.4*  CL 100*  CO2 26  BUN 11  CREATININE 0.96  CALCIUM 8.7*  GLUCOSE 114*   Lab Results  Component Value Date   CHOL 121 01/27/2017   HDL 45.00 01/27/2017   LDLCALC 63 01/27/2017   TRIG 65.0 01/27/2017   No results found for: [facility]   Radiology Studies    Dg Chest 2 View  Result Date: 11/16/2017 CLINICAL DATA:  Chest pain. EXAM: CHEST - 2 VIEW COMPARISON:  October 30, 2015 FINDINGS: Previous sternotomy. The heart, hila, mediastinum, lungs, and pleura are otherwise unchanged with no acute abnormalities. IMPRESSION: No active cardiopulmonary disease. Electronically Signed   By: Dorise Bullion III M.D   On: 11/16/2017 14:18    ECG & Cardiac Imaging    ECG 4/28: Sinus rhythm with repolarization abnormalities. No acute ischemic changes. No significant change from prior.   Assessment & Plan    Terry Hansen is a 62 y.o. male with a history of CAD s/p CABG, HL, HTN, atrial tachycardia, who is being seen today for the evaluation of palpitations. He is already on significant dose of diltiazem with 360mg  daily. Last dose increase was just a couple of days ago. Laboratory and ECG reassuring at this time. Based on examination and reproduction of symptoms with PACs on the monitor, do feel symptoms are likely related to ongoing atrial tachycardia. We discussed medication management and he does not want to restart a BB as he was so fatigued with metoprolol previously.  Will plan to continue diltiazem and place holter monitor to further evaluate burden of arrhythmia. Patient will be given instructions for presenting to clinic tomorrow for monitor placement. Will arrange for close follow-up with EP.   Signed, Bryna Colander,  MD 11/16/2017, 9:33  PM  For questions or updates, please contact   Please consult [email or web address] for contact info under Cardiology/STEMI.

## 2017-11-16 NOTE — Discharge Instructions (Addendum)
As discussed, your evaluation today has been largely reassuring.  But, it is important that you monitor your condition carefully, and do not hesitate to return to the ED if you develop new, or concerning changes in your condition.  Otherwise, please follow-up with your physician for appropriate ongoing care.  Call tomorrow to discuss your medications, today's episode and arrange for initiation of Holter monitoring.

## 2017-11-16 NOTE — Telephone Encounter (Signed)
The patient called the answering service after-hours today. Earlier today he began feeling intermittent heart racing associated with new chest pressure. The heart racing is something he's been dealing with, outlined in notes with intermittent atrial tach. Last week he had episode of near syncope after meds were increased. BP running high. The chest pressure is something that he's never experienced, has persisted and is still ongoing. I explained it is difficult to fully evaluate ongoing chest pain over the phone and if he's still having symptoms, safest thing is to go to ER to be checked out. He does not have anyone to drive him so he will call EMS. The patient verbalized gratitude for callback. Jaselle Pryer PA-C

## 2017-11-16 NOTE — ED Notes (Signed)
Pt does endorse chest pressure and fatigue. States today is worse than it has been. Was seen here a few days ago for same.

## 2017-11-16 NOTE — ED Triage Notes (Signed)
Pt to ER for evaluation of intermittent chest palpitations. Pt denies pain. Denies SOB. VSS. EMS reports sinus arrythmia. HR 86. States onset today at 11am.

## 2017-11-16 NOTE — ED Provider Notes (Signed)
[facility] EMERGENCY DEPARTMENT Provider Note   CSN: [id number] Arrival date & time: 11/16/17  1321     History   Chief Complaint Chief Complaint  Patient presents with  . Palpitations    HPI Terry Hansen is a 62 y.o. male.  HPI Patient presents with concern of palpitations. Onset was earlier today, and symptoms have resolved prior to my evaluation. He notes a history of similar episodes, though not as forceful as those he experienced today. There was palpitations and pain, though he describes the pain as pressure-like, across the anterior superior chest. He has seen his cardiologist, and another cardiologist twice within the past week, had recent increase in his Cardizem dosing to 3 6 0 mg daily. He has not had a cardiac monitor, though this has been discussed in the past. His most recent ED evaluation was 4 days ago, and he presented due to episodes of near syncope. He denies any of those today. He denies extremity weakness, lightheadedness, fever, chills, dyspnea. Past Medical History:  Diagnosis Date  . CAD (coronary artery disease)    a.  LHC 06/26/12:  pLAD 90%, prox/mid/dist CFS 20-30%, oRCA 100% (non dom), EF 55%;  b. Echo 12/13:  EF 55-60%, Gr 1 diast dysfn;  c. s/p CABG [facility]) 12/13: L-LAD/Dx    . GERD (gastroesophageal reflux disease)   . HLD (hyperlipidemia)   . Hypertension   . Ocular migraine    "maybe once q couple months" (06/02/2014)  . Unstable angina pectoris [facility])     Patient Active Problem List   Diagnosis Date Noted  . Ocular migraine   . Hypertension   . HLD (hyperlipidemia)   . Atopic dermatitis 01/27/2017  . PAT (paroxysmal atrial tachycardia) ([place]) 10/22/2015  . Palpitations 10/19/2015  . Chronotropic incompetence with sinus node dysfunction (H[facility]) 10/19/2015  . Anxiety and depression 10/24/2014  . Unstable angina pectoris ([place])   . Chest pain, mid sternal 06/02/2014  . Preventative health care 01/31/2014  .  GERD (gastroesophageal reflux disease) 01/31/2014  . HTN (hypertension) 03/01/2013  . TIA (transient ischemic attack) 08/12/2012  . Precordial pain 08/12/2012  . CAD (coronary artery disease) 06/27/2012  . Migraine 06/26/2012  . Mixed hyperlipidemia 06/26/2012    Past Surgical History:  Procedure Laterality Date  . APPENDECTOMY  1970's  . CARDIAC CATHETERIZATION  06/2012  . COLONOSCOPY  10-15 yrs ago   in [place]  . CORONARY ARTERY BYPASS GRAFT  06/29/2012   Procedure: CORONARY ARTERY BYPASS GRAFTING (CABG);  Surgeon: Melrose Nakayama, MD;  Location: [place];  Service: Open Heart Surgery;  Laterality: N/A;  times two using Left Internal Mammary Artery  . INGUINAL HERNIA REPAIR Bilateral [phone]  . LEFT HEART CATHETERIZATION WITH CORONARY ANGIOGRAM N/A 06/26/2012   Procedure: LEFT HEART CATHETERIZATION WITH CORONARY ANGIOGRAM;  Surgeon: Josue Hector, MD;  Location: [facility] CATH LAB;  Service: Cardiovascular;  Laterality: N/A;  . LEFT HEART CATHETERIZATION WITH CORONARY/GRAFT ANGIOGRAM N/A 06/03/2014   Procedure: LEFT HEART CATHETERIZATION WITH Beatrix Fetters;  Surgeon: Jettie Booze, MD;  Location: [facility] CATH LAB;  Service: Cardiovascular;  Laterality: N/A;  . NASAL SEPTUM SURGERY     For uncontrolled epistaxis early 2013        Home Medications    Prior to Admission medications   Medication Sig Start Date End Date Taking? Authorizing Provider  aspirin EC 81 MG tablet Take 1 tablet (81 mg total) by mouth daily. 07/21/12   Richardson Dopp T, PA-C  atorvastatin (  LIPITOR) 40 MG tablet TAKE 1 TABLET BY MOUTH EVERY DAY AT 6 PM 08/27/17   Nafziger, Tommi Rumps, NP  Coenzyme Q10 200 MG capsule Take 200 mg by mouth every morning. disontinue while in hospital    [provider]  diltiazem (CARDIZEM CD) 300 MG 24 hr capsule Take 1 capsule (300 mg total) by mouth daily. 07/31/17   [name], Ocie Doyne, MD  diltiazem (CARDIZEM) 30 MG tablet Take 1 tablet (30 mg total) by mouth  4 (four) times daily as needed (elevated heart rates). 08/04/17   Josue Hector, MD  fexofenadine (ALLEGRA) 180 MG tablet Take 1 tablet (180 mg total) by mouth daily. 08/28/17   Billie Ruddy, MD  fluticasone (FLONASE) 50 MCG/ACT nasal spray Place 2 sprays into both nostrils daily. 08/19/17   Nafziger, Tommi Rumps, NP  ibuprofen (ADVIL,MOTRIN) 200 MG tablet Take 600 mg by mouth every 6 (six) hours as needed. migraine headache    [provider]  losartan (COZAAR) 50 MG tablet Take 1 tablet (50 mg total) by mouth daily. 08/19/17 08/19/18  Josue Hector, MD  mometasone (NASONEX) 50 MCG/ACT nasal spray Place 2 sprays into the nose daily. 08/28/17   Billie Ruddy, MD  nitroGLYCERIN (NITROSTAT) 0.4 MG SL tablet Place 1 tablet (0.4 mg total) under the tongue every 5 (five) minutes as needed for chest pain. 11/29/13   Josue Hector, MD    Family History Family History  Problem Relation Age of Onset  . Heart disease Brother        CABG age 61, redo bypass ~50  . Heart disease Paternal Grandmother   . Heart disease Cousin        Maternal side  . Heart attack Brother   . Colon cancer Maternal Grandfather 48    Social History Social History   Tobacco Use  . Smoking status: Never Smoker  . Smokeless tobacco: Never Used  Substance Use Topics  . Alcohol use: Yes    Alcohol/week: 0.6 oz    Types: 1 Glasses of wine per week  . Drug use: No     Allergies   Patient has no known allergies.   Review of Systems Review of Systems  Constitutional:       Per HPI, otherwise negative  HENT:       Per HPI, otherwise negative  Respiratory:       Per HPI, otherwise negative  Cardiovascular:       Per HPI, otherwise negative  Gastrointestinal: Negative for vomiting.  Endocrine:       Negative aside from HPI  Genitourinary:       Neg aside from HPI   Musculoskeletal:       Per HPI, otherwise negative  Skin: Negative.   Neurological: Negative for syncope.     Physical Exam Updated  Vital Signs BP 132/82   Pulse 74   Temp 98 F (36.7 C) (Oral)   Resp 16   SpO2 98%   Physical Exam  Constitutional: He is oriented to person, place, and time. He appears well-developed. No distress.  HENT:  Head: Normocephalic and atraumatic.  Eyes: Conjunctivae and EOM are normal.  Cardiovascular: Normal rate and regular rhythm.  Pulmonary/Chest: Effort normal. No stridor. No respiratory distress.  Abdominal: He exhibits no distension.  Musculoskeletal: He exhibits no edema.  Neurological: He is alert and oriented to person, place, and time.  Skin: Skin is warm and dry.  Psychiatric: He has a normal mood and affect.  Nursing note and vitals reviewed.    ED Treatments / Results  Labs (all labs ordered are listed, but only abnormal results are displayed) Labs Reviewed  BASIC METABOLIC PANEL - Abnormal; Notable for the following components:      Result Value   Potassium 3.4 (*)    Chloride 100 (*)    Glucose, Bld 114 (*)    Calcium 8.7 (*)    All other components within normal limits  CBC  I-STAT TROPONIN, ED    EKG EKG Interpretation  Date/Time:  Sunday November 16 2017 13:24:37 EDT Ventricular Rate:  80 PR Interval:  124 QRS Duration: 102 QT Interval:  382 QTC Calculation: 440 R Axis:   76 Text Interpretation:  Normal sinus rhythm Incomplete right bundle branch block Right ventricular hypertrophy with repolarization abnormality ST-t wave abnormality No significant change since last tracing Abnormal ekg Confirmed by Carmin Muskrat [phone]) on 11/16/2017 5:39:55 PM   Radiology Dg Chest 2 View  Result Date: 11/16/2017 CLINICAL DATA:  Chest pain. EXAM: CHEST - 2 VIEW COMPARISON:  October 30, 2015 FINDINGS: Previous sternotomy. The heart, hila, mediastinum, lungs, and pleura are otherwise unchanged with no acute abnormalities. IMPRESSION: No active cardiopulmonary disease. Electronically Signed   By: Dorise Bullion III M.D   On: 11/16/2017 14:18     Procedures Procedures (including critical care time)  Medications Ordered in ED Medications - No data to display   Initial Impression / Assessment and Plan / ED Course  I have reviewed the triage vital signs and the nursing notes.  Pertinent labs & imaging results that were available during my care of the patient were reviewed by me and considered in my medical decision making (see chart for details).   EMR reviewed, including consult by cards 11/13/17:  Called regarding patient of Dr. Johnsie Cancel and Curt Bears (seen yesterday by [facility]) - presented with 2 near syncopal episodes. History of atrial tachycardia. Was on metoprolol, now on diltiazem 300 mg daily. Also has short-acting for breakthrough. I suspect these episodes represent recurrent atrial tachycardia. Troponin negative.  Would recommend increasing diltiazem to 360 mg daily and follow-up with Dr. Curt Bears. May need loop recorder or comprehensive EP study.    Pixie Casino, MD, [facility], FACP   After my initial evaluation and return of labs which were generally reassuring I discussed this case with our cardiology colleagues, who will consult and evaluate the patient.  Patient remains calm on repeat exam. Cardiology has seen and evaluated the patient, and we discussed the patient's presentation, as well as plan for initiation of Holter monitoring starting tomorrow. With no evidence for ischemia, no distress, and the patient's history of similar palpitations frequently, low suspicion for acute new pathology, but the patient does require additional monitoring, management with his cardiology team.  Final Clinical Impressions(s) / ED Diagnoses   Final diagnoses:  Palpitations     Carmin Muskrat, MD 11/16/17 2026

## 2017-11-17 ENCOUNTER — Other Ambulatory Visit: Payer: Self-pay | Admitting: Cardiology

## 2017-11-17 DIAGNOSIS — R002 Palpitations: Secondary | ICD-10-CM

## 2017-11-17 NOTE — Telephone Encounter (Signed)
Follow up    Patient is call back as advised to discuss the change in medication and how he did through the weekend. Please call to disuss.

## 2017-11-17 NOTE — Telephone Encounter (Signed)
Informed patient Dr. Curt Bears reviewed ED visit over the weekend.   Informed that he recommends starting Flecainide and having an exercise myoview. Pt understands office will call to schedule myoview testing and then I would call him w/ instructions and when to start Flecainide. Patient verbalized understanding and agreeable to plan.

## 2017-11-18 MED ORDER — FLECAINIDE ACETATE 50 MG PO TABS: 75.0000 mg | ORAL_TABLET | Freq: Two times a day (BID) | ORAL | 3 refills | Status: DC

## 2017-11-18 NOTE — Addendum Note (Signed)
Addended by: Stanton Kidney on: 11/18/2017 05:57 PM   Modules accepted: Orders

## 2017-11-18 NOTE — Telephone Encounter (Signed)
Informed patient to start Flecainide 75 mg BID on 5/7 or 5/8. Patient verbalized understanding and agreeable to plan.

## 2017-11-20 ENCOUNTER — Telehealth: Payer: Self-pay | Admitting: Cardiology

## 2017-11-20 NOTE — Telephone Encounter (Signed)
New Message   Pt c/o medication issue:  1. Name of Medication: Sleep aide   2. How are you currently taking this medication (dosage and times per day)? not  3. Are you having a reaction (difficulty breathing--STAT)? no  4. What is your medication issue? Pt would like something to help him sleep, states it can be otc and doesn't have to be prescription. Please call

## 2017-11-20 NOTE — Telephone Encounter (Signed)
Advised patient to contact PCP to discuss. Patient verbalized understanding and agreeable to plan.

## 2017-12-02 ENCOUNTER — Telehealth (HOSPITAL_COMMUNITY): Payer: Self-pay | Admitting: *Deleted

## 2017-12-02 NOTE — Telephone Encounter (Signed)
Patient given detailed instructions per Myocardial Perfusion Study Information Sheet for the test on 12/05/17 at 0915. Patient notified to arrive 15 minutes early and that it is imperative to arrive on time for appointment to keep from having the test rescheduled.  If you need to cancel or reschedule your appointment, please call the office within 24 hours of your appointment. . Patient verbalized understanding.Sham Alviar, Ranae Palms

## 2017-12-05 ENCOUNTER — Ambulatory Visit (HOSPITAL_COMMUNITY): Payer: BLUE CROSS/BLUE SHIELD | Attending: Cardiology

## 2017-12-05 DIAGNOSIS — I4519 Other right bundle-branch block: Secondary | ICD-10-CM | POA: Diagnosis not present

## 2017-12-05 DIAGNOSIS — Z8673 Personal history of transient ischemic attack (TIA), and cerebral infarction without residual deficits: Secondary | ICD-10-CM | POA: Diagnosis not present

## 2017-12-05 DIAGNOSIS — R002 Palpitations: Secondary | ICD-10-CM | POA: Insufficient documentation

## 2017-12-05 DIAGNOSIS — R9439 Abnormal result of other cardiovascular function study: Secondary | ICD-10-CM | POA: Diagnosis not present

## 2017-12-05 DIAGNOSIS — I2510 Atherosclerotic heart disease of native coronary artery without angina pectoris: Secondary | ICD-10-CM | POA: Insufficient documentation

## 2017-12-05 DIAGNOSIS — I10 Essential (primary) hypertension: Secondary | ICD-10-CM | POA: Diagnosis not present

## 2017-12-05 DIAGNOSIS — R079 Chest pain, unspecified: Secondary | ICD-10-CM | POA: Insufficient documentation

## 2017-12-05 LAB — MYOCARDIAL PERFUSION IMAGING
CHL CUP MPHR: 156 {beats}/min
CHL CUP NUCLEAR SRS: 11
CHL CUP NUCLEAR SSS: 13
CSEPEW: 10.4 METS
Exercise duration (min): 8 min
Exercise duration (sec): 30 s
LHR: 0.33
LV dias vol: 94 mL (ref 62–150)
LV sys vol: 36 mL
NUC STRESS TID: 0.96
Peak HR: 144 {beats}/min
Percent HR: 92 %
Rest HR: 72 {beats}/min
SDS: 2

## 2017-12-05 MED ORDER — TECHNETIUM TC 99M TETROFOSMIN IV KIT
31.9000 | PACK | Freq: Once | INTRAVENOUS | Status: AC | PRN
  Administered 2017-12-05: 31.9 via INTRAVENOUS
  Filled 2017-12-05: qty 32

## 2017-12-05 MED ORDER — TECHNETIUM TC 99M TETROFOSMIN IV KIT
10.3000 | PACK | Freq: Once | INTRAVENOUS | Status: AC | PRN
  Administered 2017-12-05: 10.3 via INTRAVENOUS
  Filled 2017-12-05: qty 11

## 2017-12-28 IMAGING — NM NM MISC PROCEDURE
8 series · 48 of 48 positions shown · non-contrast
Comparison: none

[Series 1: wbr_r-proj_st rest · 6.40mm/px · 6 of 64 frames shown (1 of 2)]
[frame 6/64]
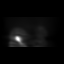
[frame 16/64]
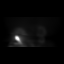
[frame 27/64]
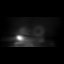
[frame 38/64]
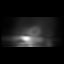
[frame 48/64]
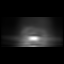
[frame 59/64]
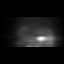

[Series 1: wbr_s-proj_st stress-gsp · 6.40mm/px · 6 of 512 frames shown]
[frame 43/512]
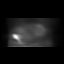
[frame 128/512]
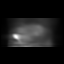
[frame 214/512]
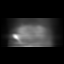
[frame 299/512]
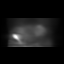
[frame 384/512]
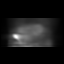
[frame 470/512]
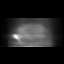

[Series 1: rest · 6.40mm/px · 6 of 64 frames shown (1 of 2)]
[frame 6/64]
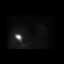
[frame 16/64]
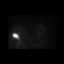
[frame 27/64]
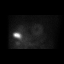
[frame 38/64]
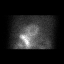
[frame 48/64]
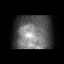
[frame 59/64]
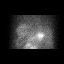

[Series 1: rest · 6.40mm/px · 6 of 64 frames shown (2 of 2)]
[frame 6/64]
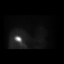
[frame 16/64]
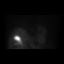
[frame 27/64]
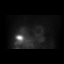
[frame 38/64]
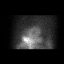
[frame 48/64]
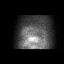
[frame 59/64]
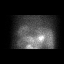

[Series 1: stress-gsp · 6.40mm/px · 6 of 512 frames shown]
[frame 43/512]
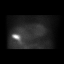
[frame 128/512]
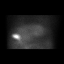
[frame 214/512]
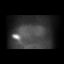
[frame 299/512]
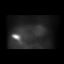
[frame 384/512]
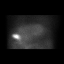
[frame 470/512]
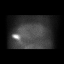

[Series 1: wbr_s-proj_st stress-sum-em · 6.40mm/px · 6 of 64 frames shown]
[frame 6/64]
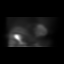
[frame 16/64]
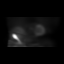
[frame 27/64]
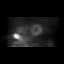
[frame 38/64]
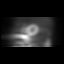
[frame 48/64]
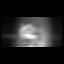
[frame 59/64]
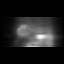

[Series 1: stress-sum-em · 6.40mm/px · 6 of 64 frames shown]
[frame 6/64]
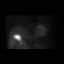
[frame 16/64]
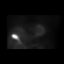
[frame 27/64]
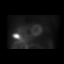
[frame 38/64]
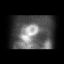
[frame 48/64]
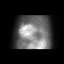
[frame 59/64]
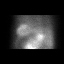

[Series 1: wbr_r-proj_st rest · 6.40mm/px · 6 of 64 frames shown (2 of 2)]
[frame 6/64]
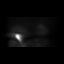
[frame 16/64]
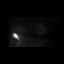
[frame 27/64]
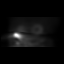
[frame 38/64]
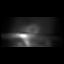
[frame 48/64]
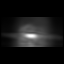
[frame 59/64]
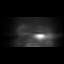

[48 of 48 positions shown; findings below may reference images not displayed]

Canned report from images found in remote index.

Refer to host system for actual result text.

## 2018-02-15 ENCOUNTER — Other Ambulatory Visit: Payer: Self-pay | Admitting: Adult Health

## 2018-02-17 NOTE — Telephone Encounter (Signed)
Left a message for the pt informing him that he is now past due for CPX. Sent in 30 day supply to the pharmacy.  Advised pt to call back and schedule.

## 2018-02-20 NOTE — Progress Notes (Signed)
Patient ID: Terry Hansen, male   DOB: 1956/05/08, 62 y.o.   MRN: [id number]             62 y.o. CABG 2013 LIMA to LAD/Diagonal patent cath 06/03/14 CRF;s HTN, HLD. 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 Has son in [place] and brother in [place]   No angina    No Known Allergies   Current Outpatient Medications  Medication Sig Dispense Refill  . aspirin EC 81 MG tablet Take 1 tablet (81 mg total) by mouth daily.    Marland Kitchen atorvastatin (LIPITOR) 40 MG tablet TAKE 1 TABLET BY MOUTH EVERY DAY AT 6 PM 30 tablet 0  . Coenzyme Q10 200 MG capsule Take 200 mg by mouth every morning. disontinue while in hospital    . diltiazem (CARDIZEM CD) 300 MG 24 hr capsule Take 1 capsule (300 mg total) by mouth daily. 30 capsule 11  . diltiazem (CARDIZEM) 30 MG tablet Take 1 tablet (30 mg total) by mouth 4 (four) times daily as needed (elevated heart rates). 30 tablet 3  . fexofenadine (ALLEGRA) 180 MG tablet Take 1 tablet (180 mg total) by mouth daily. 30 tablet 2  . flecainide (TAMBOCOR) 50 MG tablet Take 1.5 tablets (75 mg total) by mouth 2 (two) times daily. 90 tablet 3  . fluticasone (FLONASE) 50 MCG/ACT nasal spray Place 2 sprays into both nostrils daily. 16 g 6  . ibuprofen (ADVIL,MOTRIN) 200 MG tablet Take 600 mg by mouth every 6 (six) hours as needed. migraine headache    . losartan (COZAAR) 50 MG tablet Take 1 tablet (50 mg total) by mouth daily. 90 tablet 3  . mometasone (NASONEX) 50 MCG/ACT nasal spray Place 2 sprays into the nose daily. 17 g 1  . nitroGLYCERIN (NITROSTAT) 0.4 MG SL tablet Place 1 tablet (0.4 mg total) under the tongue every 5 (five) minutes as needed for chest pain. 25 tablet 3   No current facility-administered medications for this visit.       Past Medical History:  Diagnosis Date  . CAD (coronary artery disease)    a.  LHC 06/26/12:  pLAD 90%, prox/mid/dist CFS 20-30%, oRCA 100% (non dom), EF 55%;  b. Echo 12/13:  EF 55-60%, Gr 1 diast dysfn;  c. s/p CABG [facility]) 12/13: L-LAD/Dx    . GERD (gastroesophageal reflux disease)   . HLD (hyperlipidemia)   . Hypertension   . Ocular migraine    "maybe once q couple months" (06/02/2014)  . Unstable angina pectoris [facility])     Past Surgical History:  Procedure Laterality Date  . APPENDECTOMY  1970's  . CARDIAC CATHETERIZATION  06/2012  . COLONOSCOPY  10-15 yrs ago   in [place]  . CORONARY ARTERY BYPASS GRAFT  06/29/2012   Procedure: CORONARY ARTERY BYPASS GRAFTING (CABG);  Surgeon: Melrose Nakayama, MD;  Location: [facility];  Service: Open Heart Surgery;  Laterality: N/A;  times two using Left Internal Mammary Artery  . INGUINAL HERNIA REPAIR Bilateral [phone]  . LEFT HEART CATHETERIZATION WITH CORONARY ANGIOGRAM N/A 06/26/2012   Procedure: LEFT HEART CATHETERIZATION WITH CORONARY ANGIOGRAM;  Surgeon: Josue Hector, MD;  Location: [facility] CATH LAB;  Service: Cardiovascular;  Laterality: N/A;  . LEFT HEART CATHETERIZATION WITH CORONARY/GRAFT ANGIOGRAM N/A  06/03/2014   Procedure: LEFT HEART CATHETERIZATION WITH Beatrix Fetters;  Surgeon: Jettie Booze, MD;  Location: [facility] CATH LAB;  Service: Cardiovascular;  Laterality: N/A;  . NASAL SEPTUM SURGERY     For uncontrolled epistaxis early 2013    Family History  Problem Relation Age of Onset  . Heart disease Brother        CABG age 88, redo bypass ~50  . Heart disease Paternal Grandmother   . Heart disease Cousin        Maternal side  . Heart attack Brother   . Colon cancer Maternal Grandfather 80    Social History   Socioeconomic History  . Marital status: Divorced    Spouse name: Not on file  . Number of children: Not on file  . Years of education: Not on file  . Highest education level: Not on file   Occupational History  . Not on file  Social Needs  . Financial resource strain: Not on file  . Food insecurity:    Worry: Not on file    Inability: Not on file  . Transportation needs:    Medical: Not on file    Non-medical: Not on file  Tobacco Use  . Smoking status: Never Smoker  . Smokeless tobacco: Never Used  Substance and Sexual Activity  . Alcohol use: Yes    Alcohol/week: 0.6 oz    Types: 1 Glasses of wine per week  . Drug use: No  . Sexual activity: Not Currently  Lifestyle  . Physical activity:    Days per week: Not on file    Minutes per session: Not on file  . Stress: Not on file  Relationships  . Social connections:    Talks on phone: Not on file    Gets together: Not on file    Attends religious service: Not on file    Active member of club or organization: Not on file    Attends meetings of clubs or organizations: Not on file    Relationship status: Not on file  . Intimate partner violence:    Fear of current or ex partner: Not on file    Emotionally abused: Not on file    Physically abused: Not on file    Forced sexual activity: Not on file  Other Topics Concern  . Not on file  Social History Narrative   Divorced 7 years ago   3 children   Works in Aeronautical engineer    ROS:  insomnia and anxiety mild GERD all other symptoms reviewed and negative   Vitals:   02/23/18 0928  BP: 120/80  Pulse: 81  SpO2: 98%    Affect appropriate Healthy:  appears stated age HEENT: normal Neck supple with no adenopathy JVP normal no bruits no thyromegaly Lungs clear with no wheezing and good diaphragmatic motion Heart:  S1/S2 no murmur, no rub, gallop or click PMI normal Abdomen: benighn, BS positve, no tenderness, no AAA no bruit.  No HSM or HJR Distal pulses intact with no bruits No edema Neuro non-focal Skin warm and dry No muscular weakness     Wt Readings from Last 3 Encounters:  02/23/18 194 lb (88 kg)  11/12/17 200 lb 6.4 oz (90.9  kg)  08/28/17 197 lb (89.4 kg)    Lab Results  Component Value Date   WBC 8.5 11/16/2017   HGB 16.2 11/16/2017   HCT 45.7 11/16/2017   PLT 228 11/16/2017   GLUCOSE 114 (H) 11/16/2017  CHOL 121 01/27/2017   TRIG 65.0 01/27/2017   HDL 45.00 01/27/2017   LDLCALC 63 01/27/2017   ALT 33 01/27/2017   AST 23 01/27/2017   NA 137 11/16/2017   K 3.4 (L) 11/16/2017   CL 100 (L) 11/16/2017   CREATININE 0.96 11/16/2017   BUN 11 11/16/2017   CO2 26 11/16/2017   TSH 1.88 01/27/2017   PSA 1.03 01/27/2017   INR 1.05 10/19/2015   HGBA1C 5.6 06/25/2012    EKG: Normal sinus rhythm, no acute change  09/29/14     Impression:  CAD/CABG: LIMA LAD/D1 patent cath 2015 myovue 10/2015 anterior MI no ischemia EF 41% But echo in 10/23/15  showed normal EF.  myovue done 12/05/17 normal EF 62%  ATrial Tachycardia: Sees [name] stable could have trial of beta blocker in future if needed Continue flecainide   HTN:  Well controlled.  Continue current medications and low sodium Dash type diet.     Cholesterol: at goal labs with primary   Lab Results  Component Value Date   CHOL 121 01/27/2017   HDL 45.00 01/27/2017   LDLCALC 63 01/27/2017   TRIG 65.0 01/27/2017   CHOLHDL 3 01/27/2017    Jenkins Rouge

## 2018-02-23 ENCOUNTER — Ambulatory Visit: Payer: BLUE CROSS/BLUE SHIELD | Admitting: Cardiovascular Disease

## 2018-02-23 ENCOUNTER — Encounter: Payer: Self-pay | Admitting: Cardiovascular Disease

## 2018-02-23 VITALS — BP 120/80 | HR 81 | Ht 71.0 in | Wt 194.0 lb

## 2018-02-23 DIAGNOSIS — I2510 Atherosclerotic heart disease of native coronary artery without angina pectoris: Secondary | ICD-10-CM

## 2018-02-23 NOTE — Patient Instructions (Addendum)

## 2018-03-04 ENCOUNTER — Ambulatory Visit: Payer: BLUE CROSS/BLUE SHIELD | Admitting: Internal Medicine

## 2018-03-04 ENCOUNTER — Encounter: Payer: Self-pay | Admitting: Internal Medicine

## 2018-03-04 VITALS — BP 132/72 | HR 75 | Temp 98.0°F | Wt 199.8 lb

## 2018-03-04 DIAGNOSIS — I10 Essential (primary) hypertension: Secondary | ICD-10-CM

## 2018-03-04 DIAGNOSIS — R5381 Other malaise: Secondary | ICD-10-CM

## 2018-03-04 DIAGNOSIS — Z79899 Other long term (current) drug therapy: Secondary | ICD-10-CM | POA: Diagnosis not present

## 2018-03-04 NOTE — Progress Notes (Signed)
Chief Complaint  Patient presents with  . Fatigue    decreased energy x 1 day, body aches, chest discomfort. Pt states that he feels not like himself - feeling comes and goes. Little lightheadedness. BP has been in okay range.     HPI: Terry Hansen 62 y.o. come in for sda  appt  PCP NA  C/o od  Fatigue  For 1 day and  Feels extremely tired low energy.  And now body aches  Arms shoulder   And feels limpa nd numb.    Took some ibuprofen for aches and pains   And helps but still  Extremely tired .  no fever.  No exposures   Travel.   No rashes  .  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  Has cad and saw his cardiology  August 5th  And felt to be stable   ? No new meds  ROS: See pertinent positives and negatives per HPI. Neg gi gu  resp  Past Medical History:  Diagnosis Date  . CAD (coronary artery disease)    a.  LHC 06/26/12:  pLAD 90%, prox/mid/dist CFS 20-30%, oRCA 100% (non dom), EF 55%;  b. Echo 12/13:  EF 55-60%, Gr 1 diast dysfn;  c. s/p CABG [facility]) 12/13: L-LAD/Dx    . GERD (gastroesophageal reflux disease)   . HLD (hyperlipidemia)   . Hypertension   . Ocular migraine    "maybe once q couple months" (06/02/2014)  . Unstable angina pectoris (H[facility])     Family History  Problem Relation Age of Onset  . Heart disease Brother        CABG age 62, redo bypass ~50  . Heart disease Paternal Grandmother   . Heart disease Cousin        Maternal side  . Heart attack Brother   . Colon cancer Maternal Grandfather 55    Social History   Socioeconomic History  . Marital status: Divorced    Spouse name: Not on file  . Number of children: Not on file  . Years of education: Not on file  . Highest education level: Not on file  Occupational History  . Not on file  Social Needs  . Financial resource strain: Not on file  . Food  insecurity:    Worry: Not on file    Inability: Not on file  . Transportation needs:    Medical: Not on file    Non-medical: Not on file  Tobacco Use  . Smoking status: Never Smoker  . Smokeless tobacco: Never Used  Substance and Sexual Activity  . Alcohol use: Yes    Alcohol/week: 1.0 standard drinks    Types: 1 Glasses of wine per week  . Drug use: No  . Sexual activity: Not Currently  Lifestyle  . Physical activity:    Days per week: Not on file    Minutes per session: Not on file  . Stress: Not on file  Relationships  . Social connections:    Talks on phone: Not on file    Gets together: Not on file    Attends religious service: Not on file    Active member of club or organization: Not on file    Attends meetings of clubs or organizations: Not on file  Relationship status: Not on file  Other Topics Concern  . Not on file  Social History Narrative   Divorced 7 years ago   3 children   Works in Aeronautical engineer    Outpatient Medications Prior to Visit  Medication Sig Dispense Refill  . aspirin EC 81 MG tablet Take 1 tablet (81 mg total) by mouth daily.    Marland Kitchen atorvastatin (LIPITOR) 40 MG tablet TAKE 1 TABLET BY MOUTH EVERY DAY AT 6 PM 30 tablet 0  . Coenzyme Q10 200 MG capsule Take 200 mg by mouth every morning. disontinue while in hospital    . diltiazem (CARDIZEM CD) 300 MG 24 hr capsule Take 1 capsule (300 mg total) by mouth daily. 30 capsule 11  . diltiazem (CARDIZEM) 30 MG tablet Take 1 tablet (30 mg total) by mouth 4 (four) times daily as needed (elevated heart rates). 30 tablet 3  . flecainide (TAMBOCOR) 50 MG tablet Take 1.5 tablets (75 mg total) by mouth 2 (two) times daily. 90 tablet 3  . ibuprofen (ADVIL,MOTRIN) 200 MG tablet Take 600 mg by mouth every 6 (six) hours as needed. migraine headache    . losartan (COZAAR) 50 MG tablet Take 1 tablet (50 mg total) by mouth daily. 90 tablet 3  . nitroGLYCERIN (NITROSTAT) 0.4 MG SL tablet Place 1 tablet (0.4 mg  total) under the tongue every 5 (five) minutes as needed for chest pain. 25 tablet 3  . fexofenadine (ALLEGRA) 180 MG tablet Take 1 tablet (180 mg total) by mouth daily. (Patient not taking: Reported on 03/04/2018) 30 tablet 2  . fluticasone (FLONASE) 50 MCG/ACT nasal spray Place 2 sprays into both nostrils daily. (Patient not taking: Reported on 03/04/2018) 16 g 6  . mometasone (NASONEX) 50 MCG/ACT nasal spray Place 2 sprays into the nose daily. (Patient not taking: Reported on 03/04/2018) 17 g 1   No facility-administered medications prior to visit.      EXAM:  BP 132/72 (BP Location: Right Arm, Patient Position: Sitting, Cuff Size: Normal)   Pulse 75   Temp 98 F (36.7 C) (Oral)   Wt 199 lb 12.8 oz (90.6 kg)   SpO2 97%   BMI 27.87 kg/m   Body mass index is 27.87 kg/m.  GENERAL: vitals reviewed and listed above, alert, oriented, appears well hydrated and in no acute distress HEENT: atraumatic, conjunctiva  clear, no obvious abnormalities on inspection of external nose and ears  Tm clear OP : no lesion edema or exudate  NECK: no obvious masses on inspection palpation  LUNGS: clear to auscultation bilaterally, no wheezes, rales or rhonchi, good air movement CV: HRRR, no clubbing cyanosis or  peripheral edema nl cap refill  Abdomen:  Sof,t normal bowel sounds without hepatosplenomegaly, no guarding rebound or masses no CVA tenderness Neuro  Non focal  Skin no acute changes  MS: moves all extremities without noticeable focal  abnormality PSYCH: pleasant and cooperative, no obvious depression or anxiety Lab Results  Component Value Date   WBC 8.5 11/16/2017   HGB 16.2 11/16/2017   HCT 45.7 11/16/2017   PLT 228 11/16/2017   GLUCOSE 114 (H) 11/16/2017   CHOL 121 01/27/2017   TRIG 65.0 01/27/2017   HDL 45.00 01/27/2017   LDLCALC 63 01/27/2017   ALT 33 01/27/2017   AST 23 01/27/2017   NA 137 11/16/2017   K 3.4 (L) 11/16/2017   CL 100 (L) 11/16/2017   CREATININE 0.96 11/16/2017     BUN 11 11/16/2017  CO2 26 11/16/2017   TSH 1.88 01/27/2017   PSA 1.03 01/27/2017   INR 1.05 10/19/2015   HGBA1C 5.6 06/25/2012   BP Readings from Last 3 Encounters:  03/04/18 132/72  02/23/18 120/80  11/16/17 136/90   Wt Readings from Last 3 Encounters:  03/04/18 199 lb 12.8 oz (90.6 kg)  02/23/18 194 lb (88 kg)  11/12/17 200 lb 6.4 oz (90.9 kg)    ASSESSMENT AND PLAN:  Discussed the following assessment and plan:  Malaise - Plan: Basic metabolic panel, CBC with Differential/Platelet, Hemoglobin A1c, Hepatic function panel, TSH, T4, free  Medication management - Plan: Basic metabolic panel, CBC with Differential/Platelet, Hemoglobin A1c, Hepatic function panel, TSH, T4, free  Essential hypertension - Plan: Basic metabolic panel, CBC with Differential/Platelet, Hemoglobin A1c, Hepatic function panel, TSH, T4, free reassuring exam   Feels some better now  Lab today and then  Plan fu pcp if ongoing this could be a viral prodrome   Or other  Prodrome. Plan fu depending on lab and how doing  Keep appt pcp in september -Patient advised to return or notify health care team  if  new concerns arise.  Patient Instructions  Your exam is reassuring but checking  For anemia  Potassium  Kidney and liver and thyroid issues    Plan fu if fever persistent  Sx  Stay hydrated and  Cool temps .         Standley Brooking. Panosh M.D.

## 2018-03-04 NOTE — Patient Instructions (Signed)
Your exam is reassuring but checking  For anemia  Potassium  Kidney and liver and thyroid issues    Plan fu if fever persistent  Sx  Stay hydrated and  Cool temps .

## 2018-03-05 LAB — CBC WITH DIFFERENTIAL/PLATELET
BASOS PCT: 0.4 % (ref 0.0–3.0)
Basophils Absolute: 0 10*3/uL (ref 0.0–0.1)
EOS PCT: 1.3 % (ref 0.0–5.0)
Eosinophils Absolute: 0.1 10*3/uL (ref 0.0–0.7)
HEMATOCRIT: 44 % (ref 39.0–52.0)
HEMOGLOBIN: 15.2 g/dL (ref 13.0–17.0)
LYMPHS PCT: 17.8 % (ref 12.0–46.0)
Lymphs Abs: 1.3 10*3/uL (ref 0.7–4.0)
MCHC: 34.5 g/dL (ref 30.0–36.0)
MCV: 86.1 fl (ref 78.0–100.0)
MONOS PCT: 7.5 % (ref 3.0–12.0)
Monocytes Absolute: 0.6 10*3/uL (ref 0.1–1.0)
Neutro Abs: 5.4 10*3/uL (ref 1.4–7.7)
Neutrophils Relative %: 73 % (ref 43.0–77.0)
Platelets: 288 10*3/uL (ref 150.0–400.0)
RBC: 5.11 Mil/uL (ref 4.22–5.81)
RDW: 13.4 % (ref 11.5–15.5)
WBC: 7.4 10*3/uL (ref 4.0–10.5)

## 2018-03-05 LAB — T4, FREE: Free T4: 0.9 ng/dL (ref 0.60–1.60)

## 2018-03-05 LAB — HEPATIC FUNCTION PANEL
ALBUMIN: 4.1 g/dL (ref 3.5–5.2)
ALK PHOS: 110 U/L (ref 39–117)
ALT: 17 U/L (ref 0–53)
AST: 15 U/L (ref 0–37)
Bilirubin, Direct: 0.1 mg/dL (ref 0.0–0.3)
Total Bilirubin: 0.4 mg/dL (ref 0.2–1.2)
Total Protein: 6.4 g/dL (ref 6.0–8.3)

## 2018-03-05 LAB — HEMOGLOBIN A1C: Hgb A1c MFr Bld: 5.6 % (ref 4.6–6.5)

## 2018-03-05 LAB — TSH: TSH: 1.28 u[IU]/mL (ref 0.35–4.50)

## 2018-03-05 LAB — BASIC METABOLIC PANEL
BUN: 12 mg/dL (ref 6–23)
CHLORIDE: 101 meq/L (ref 96–112)
CO2: 31 mEq/L (ref 19–32)
Calcium: 9.5 mg/dL (ref 8.4–10.5)
Creatinine, Ser: 1.06 mg/dL (ref 0.40–1.50)
GFR: 74.63 mL/min (ref >60.00)
Glucose, Bld: 92 mg/dL (ref 70–99)
POTASSIUM: 4.2 meq/L (ref 3.5–5.1)
SODIUM: 136 meq/L (ref 135–145)

## 2018-03-22 ENCOUNTER — Other Ambulatory Visit: Payer: Self-pay | Admitting: Adult Health

## 2018-03-22 ENCOUNTER — Other Ambulatory Visit: Payer: Self-pay | Admitting: Cardiology

## 2018-03-24 ENCOUNTER — Other Ambulatory Visit: Payer: Self-pay | Admitting: Cardiology

## 2018-03-24 MED ORDER — FLECAINIDE ACETATE 50 MG PO TABS: 75.0000 mg | ORAL_TABLET | Freq: Two times a day (BID) | ORAL | 7 refills | Status: DC

## 2018-03-24 NOTE — Telephone Encounter (Signed)
Pt's medication was sent to pt's pharmacy as requested. Confirmation received.  °

## 2018-03-31 ENCOUNTER — Encounter: Payer: Self-pay | Admitting: Adult Health

## 2018-03-31 ENCOUNTER — Ambulatory Visit (INDEPENDENT_AMBULATORY_CARE_PROVIDER_SITE_OTHER): Payer: BLUE CROSS/BLUE SHIELD | Admitting: Adult Health

## 2018-03-31 VITALS — BP 124/84 | Temp 98.1°F | Ht 70.25 in | Wt 196.0 lb

## 2018-03-31 DIAGNOSIS — Z1211 Encounter for screening for malignant neoplasm of colon: Secondary | ICD-10-CM

## 2018-03-31 DIAGNOSIS — I2510 Atherosclerotic heart disease of native coronary artery without angina pectoris: Secondary | ICD-10-CM

## 2018-03-31 DIAGNOSIS — Z125 Encounter for screening for malignant neoplasm of prostate: Secondary | ICD-10-CM

## 2018-03-31 DIAGNOSIS — I10 Essential (primary) hypertension: Secondary | ICD-10-CM

## 2018-03-31 DIAGNOSIS — Z0000 Encounter for general adult medical examination without abnormal findings: Secondary | ICD-10-CM | POA: Diagnosis not present

## 2018-03-31 DIAGNOSIS — E785 Hyperlipidemia, unspecified: Secondary | ICD-10-CM

## 2018-03-31 LAB — LIPID PANEL
CHOL/HDL RATIO: 2
CHOLESTEROL: 110 mg/dL (ref 0–200)
HDL: 46.1 mg/dL (ref >39.00)
LDL CALC: 55 mg/dL (ref 0–99)
NonHDL: 63.92
TRIGLYCERIDES: 45 mg/dL (ref 0.0–149.0)
VLDL: 9 mg/dL (ref 0.0–40.0)

## 2018-03-31 LAB — BASIC METABOLIC PANEL
BUN: 10 mg/dL (ref 6–23)
CHLORIDE: 101 meq/L (ref 96–112)
CO2: 31 mEq/L (ref 19–32)
Calcium: 9.2 mg/dL (ref 8.4–10.5)
Creatinine, Ser: 0.98 mg/dL (ref 0.40–1.50)
GFR: 81.69 mL/min (ref >60.00)
Glucose, Bld: 91 mg/dL (ref 70–99)
POTASSIUM: 4.3 meq/L (ref 3.5–5.1)
SODIUM: 138 meq/L (ref 135–145)

## 2018-03-31 LAB — CBC WITH DIFFERENTIAL/PLATELET
BASOS PCT: 0.3 % (ref 0.0–3.0)
Basophils Absolute: 0 10*3/uL (ref 0.0–0.1)
EOS PCT: 1.4 % (ref 0.0–5.0)
Eosinophils Absolute: 0.1 10*3/uL (ref 0.0–0.7)
HEMATOCRIT: 45 % (ref 39.0–52.0)
HEMOGLOBIN: 15.7 g/dL (ref 13.0–17.0)
LYMPHS PCT: 20.9 % (ref 12.0–46.0)
Lymphs Abs: 1.5 10*3/uL (ref 0.7–4.0)
MCHC: 34.9 g/dL (ref 30.0–36.0)
MCV: 86 fl (ref 78.0–100.0)
MONO ABS: 0.5 10*3/uL (ref 0.1–1.0)
Monocytes Relative: 7.3 % (ref 3.0–12.0)
Neutro Abs: 4.9 10*3/uL (ref 1.4–7.7)
Neutrophils Relative %: 70.1 % (ref 43.0–77.0)
Platelets: 273 10*3/uL (ref 150.0–400.0)
RBC: 5.24 Mil/uL (ref 4.22–5.81)
RDW: 13.4 % (ref 11.5–15.5)
WBC: 7 10*3/uL (ref 4.0–10.5)

## 2018-03-31 LAB — HEPATIC FUNCTION PANEL
ALT: 24 U/L (ref 0–53)
AST: 14 U/L (ref 0–37)
Albumin: 4.1 g/dL (ref 3.5–5.2)
Alkaline Phosphatase: 109 U/L (ref 39–117)
BILIRUBIN DIRECT: 0.1 mg/dL (ref 0.0–0.3)
BILIRUBIN TOTAL: 0.5 mg/dL (ref 0.2–1.2)
Total Protein: 6.6 g/dL (ref 6.0–8.3)

## 2018-03-31 LAB — PSA: PSA: 5.69 ng/mL — AB (ref 0.10–4.00)

## 2018-03-31 NOTE — Progress Notes (Signed)
Subjective:    Patient ID: Terry Hansen, male    DOB: 02-22-1956, 62 y.o.   MRN: [id number]  HPI Patient presents for yearly preventative medicine examination. He is a pleasant 62 year old male who  has a past medical history of CAD (coronary artery disease), GERD (gastroesophageal reflux disease), HLD (hyperlipidemia), Hypertension, Ocular migraine, and Unstable angina pectoris ([place]).  CAD s/po CABG in 2013- is followed by Cardiology [facility] and [facility]). Currently prescribed Lipitor 40 mg, Asa 81 mg, Cardizem 300 mg daily and Tambocor 75 mg daily.  Lab Results  Component Value Date   CHOL 121 01/27/2017   HDL 45.00 01/27/2017   LDLCALC 63 01/27/2017   TRIG 65.0 01/27/2017   CHOLHDL 3 01/27/2017   Hypertension - controlled with Cozaar  BP Readings from Last 3 Encounters:  03/31/18 124/84  03/04/18 132/72  02/23/18 120/80   Anxiety and Depression - Feels well controlled for the most part without medications . First occurred around 2013 following his CABG. He is hopeful that after retirement in less than a year his anxiety will improve.   All immunizations and health maintenance protocols were reviewed with the patient and needed orders were placed.  Appropriate screening laboratory values were ordered for the patient including screening of hyperlipidemia, renal function and hepatic function. If indicated by BPH, a PSA was ordered.  Medication reconciliation,  past medical history, social history, problem list and allergies were reviewed in detail with the patient  Goals were established with regard to weight loss, exercise, and  diet in compliance with medications. Does not exercise on a routine basis. Stays active working around the house. He does not follow a specific diet.   End of life planning was discussed.  He is due for colon cancer screen - he would like to do cologuard. He does not see a dentist on a regular basis. He does see his eye doctor on a regular basis.     He has no acute complaints today   Review of Systems  Constitutional: Negative.   HENT: Negative.   Eyes: Negative.   Respiratory: Negative.   Cardiovascular: Negative.   Gastrointestinal: Negative.   Endocrine: Negative.   Genitourinary: Negative.   Musculoskeletal: Negative.   Skin: Negative.   Allergic/Immunologic: Negative.   Neurological: Negative.   Hematological: Negative.   Psychiatric/Behavioral: Negative.   All other systems reviewed and are negative.      Objective:   Physical Exam  Constitutional: He is oriented to person, place, and time. He appears well-developed and well-nourished. No distress.  HENT:  Head: Normocephalic and atraumatic.  Right Ear: Hearing, tympanic membrane, external ear and ear canal normal.  Left Ear: Hearing, tympanic membrane, external ear and ear canal normal.  Nose: Nose normal.  Mouth/Throat: Uvula is midline, oropharynx is clear and moist and mucous membranes are normal. Mucous membranes are not pale. No oropharyngeal exudate.  Eyes: Pupils are equal, round, and reactive to light. Conjunctivae and EOM are normal. Right eye exhibits no discharge. Left eye exhibits no discharge. No scleral icterus.  Neck: Normal range of motion. Neck supple. No JVD present. No tracheal deviation present. No thyromegaly present.  Cardiovascular: Normal rate, regular rhythm, normal heart sounds and intact distal pulses. Exam reveals no gallop and no friction rub.  No murmur heard. Pulmonary/Chest: Effort normal and breath sounds normal. No stridor. No respiratory distress. He has no wheezes. He has no rales. He exhibits no tenderness.  Abdominal: Soft. Bowel sounds are normal. He  exhibits no distension and no mass. There is no tenderness. There is no rebound and no guarding. No hernia.  Genitourinary:  Genitourinary Comments: Will do PSA   Musculoskeletal: Normal range of motion. He exhibits no edema, tenderness or deformity.  Lymphadenopathy:    He  has no cervical adenopathy.  Neurological: He is alert and oriented to person, place, and time. He displays normal reflexes. No cranial nerve deficit or sensory deficit. He exhibits normal muscle tone. Coordination normal.  Skin: Skin is warm and dry. Capillary refill takes less than 2 seconds. No rash noted. He is not diaphoretic. No erythema. No pallor.  Surgical scar on abdomen and chest   Psychiatric: He has a normal mood and affect. His behavior is normal. Judgment and thought content normal.  Nursing note and vitals reviewed.     Assessment & Plan:  1. Routine general medical examination at a health care facility - Encouraged routine aerobic exercise and heart healthy diet.  - Encouraged to find a dentist.  - Follow up in one year or sooner if needed - Basic metabolic panel - CBC with Differential/Platelet - Hepatic function panel - Lipid panel  2. Prostate cancer screening  - PSA  3. Colon cancer screening - Will order cologuard   4. Essential hypertension - Well controlled.  No change in medications  - Basic metabolic panel - CBC with Differential/Platelet - Hepatic function panel - Lipid panel  5. Hyperlipidemia, unspecified hyperlipidemia type - Consider increase in lipitor - Encouraged diet and exercise  - Basic metabolic panel - CBC with Differential/Platelet - Hepatic function panel - Lipid panel  6. Coronary artery disease involving native coronary artery of native heart without angina pectoris - Continue with POC by cardiology  - Basic metabolic panel - CBC with Differential/Platelet - Hepatic function panel - Lipid panel   [name], NP

## 2018-04-03 ENCOUNTER — Other Ambulatory Visit: Payer: Self-pay | Admitting: Family Medicine

## 2018-04-03 DIAGNOSIS — R972 Elevated prostate specific antigen [PSA]: Secondary | ICD-10-CM

## 2018-04-05 DIAGNOSIS — Z1211 Encounter for screening for malignant neoplasm of colon: Secondary | ICD-10-CM | POA: Diagnosis not present

## 2018-04-05 DIAGNOSIS — Z1212 Encounter for screening for malignant neoplasm of rectum: Secondary | ICD-10-CM | POA: Diagnosis not present

## 2018-04-05 LAB — COLOGUARD: COLOGUARD: POSITIVE

## 2018-04-06 ENCOUNTER — Telehealth: Payer: Self-pay

## 2018-04-06 NOTE — Telephone Encounter (Signed)
Copied from [place] [id number]. Topic: Quick Communication - See Telephone Encounter >> Apr 06, 2018  2:53 PM Hewitt Shorts wrote: Pt would like to know if he could have his PSA tested again before going to the urologist  Because he is feeling a little better and would like to see the levels   Best number [phone]

## 2018-04-07 ENCOUNTER — Other Ambulatory Visit: Payer: Self-pay | Admitting: Family Medicine

## 2018-04-07 DIAGNOSIS — R972 Elevated prostate specific antigen [PSA]: Secondary | ICD-10-CM

## 2018-04-07 NOTE — Progress Notes (Signed)
psa

## 2018-04-07 NOTE — Telephone Encounter (Signed)
Left a message for a return call.  CRM created.  Order for lab placed in [place].  Pt needs to be scheduled.

## 2018-04-07 NOTE — Telephone Encounter (Signed)
That is fine 

## 2018-04-08 NOTE — Telephone Encounter (Signed)
Pt scheduled.     No further action required.

## 2018-04-09 ENCOUNTER — Other Ambulatory Visit (INDEPENDENT_AMBULATORY_CARE_PROVIDER_SITE_OTHER): Payer: BLUE CROSS/BLUE SHIELD

## 2018-04-09 DIAGNOSIS — R972 Elevated prostate specific antigen [PSA]: Secondary | ICD-10-CM | POA: Diagnosis not present

## 2018-04-09 LAB — PSA: PSA: 2.89 ng/mL (ref 0.10–4.00)

## 2018-04-15 ENCOUNTER — Telehealth: Payer: Self-pay | Admitting: *Deleted

## 2018-04-15 DIAGNOSIS — R195 Other fecal abnormalities: Secondary | ICD-10-CM

## 2018-04-15 NOTE — Telephone Encounter (Signed)
Patient's cologuard is positive. Please advise.

## 2018-04-15 NOTE — Telephone Encounter (Signed)
Patient is aware and referral placed for GI

## 2018-04-15 NOTE — Telephone Encounter (Signed)
Please inform patient that his cologuard was positive and that he will need to have a colonoscopy

## 2018-04-16 ENCOUNTER — Encounter: Payer: Self-pay | Admitting: Gastroenterology

## 2018-04-16 ENCOUNTER — Telehealth: Payer: Self-pay | Admitting: Adult Health

## 2018-04-16 NOTE — Telephone Encounter (Signed)
Copied from [place] [id number]. Topic: General - Other >> Apr 16, 2018  2:17 PM Marin Olp L wrote: Reason for CRM: Patient would his cologaurd test results mailed to him.

## 2018-04-17 NOTE — Telephone Encounter (Signed)
Copy mailed to home address

## 2018-04-18 ENCOUNTER — Other Ambulatory Visit: Payer: Self-pay | Admitting: Adult Health

## 2018-05-19 ENCOUNTER — Ambulatory Visit: Payer: BLUE CROSS/BLUE SHIELD | Admitting: Gastroenterology

## 2018-05-26 ENCOUNTER — Ambulatory Visit: Payer: BLUE CROSS/BLUE SHIELD | Admitting: Adult Health

## 2018-05-26 ENCOUNTER — Encounter: Payer: Self-pay | Admitting: Adult Health

## 2018-05-26 VITALS — BP 142/70 | HR 81 | Temp 97.8°F | Wt 197.8 lb

## 2018-05-26 DIAGNOSIS — H6983 Other specified disorders of Eustachian tube, bilateral: Secondary | ICD-10-CM | POA: Diagnosis not present

## 2018-05-26 MED ORDER — FLUTICASONE PROPIONATE 50 MCG/ACT NA SUSP: 2.0000 | Freq: Every day | NASAL | 6 refills | Status: DC

## 2018-05-26 NOTE — Progress Notes (Signed)
Subjective:    Patient ID: Terry Hansen, male    DOB: December 24, 1955, 62 y.o.   MRN: [id number]  HPI 62 year old male who  has a past medical history of CAD (coronary artery disease), GERD (gastroesophageal reflux disease), HLD (hyperlipidemia), Hypertension, Ocular migraine, and Unstable angina pectoris ([place]).  Presents to the office today for the feeling of " my ears being stopped up". Left greater than right. Symptoms started one week ago and are not consistent throughout the day " I can feel them drain sometimes". He denies any fevers, sinus pain/pressure, rhinorrhea, sore throat, or feeling ill. Does not have any ear pain but does experience tinnitus    Review of Systems See HPI   Past Medical History:  Diagnosis Date  . CAD (coronary artery disease)    a.  LHC 06/26/12:  pLAD 90%, prox/mid/dist CFS 20-30%, oRCA 100% (non dom), EF 55%;  b. Echo 12/13:  EF 55-60%, Gr 1 diast dysfn;  c. s/p CABG [facility]) 12/13: L-LAD/Dx    . GERD (gastroesophageal reflux disease)   . HLD (hyperlipidemia)   . Hypertension   . Ocular migraine    "maybe once q couple months" (06/02/2014)  . Unstable angina pectoris [facility])     Social History   Socioeconomic History  . Marital status: Divorced    Spouse name: Not on file  . Number of children: Not on file  . Years of education: Not on file  . Highest education level: Not on file  Occupational History  . Not on file  Social Needs  . Financial resource strain: Not on file  . Food insecurity:    Worry: Not on file    Inability: Not on file  . Transportation needs:    Medical: Not on file    Non-medical: Not on file  Tobacco Use  . Smoking status: Never Smoker  . Smokeless tobacco: Never Used  Substance and Sexual Activity  . Alcohol use: Yes    Alcohol/week: 1.0 standard drinks    Types: 1 Glasses of wine per week  . Drug use: No  . Sexual activity: Not Currently  Lifestyle  . Physical activity:    Days per week: Not on file   Minutes per session: Not on file  . Stress: Not on file  Relationships  . Social connections:    Talks on phone: Not on file    Gets together: Not on file    Attends religious service: Not on file    Active member of club or organization: Not on file    Attends meetings of clubs or organizations: Not on file    Relationship status: Not on file  . Intimate partner violence:    Fear of current or ex partner: Not on file    Emotionally abused: Not on file    Physically abused: Not on file    Forced sexual activity: Not on file  Other Topics Concern  . Not on file  Social History Narrative   Divorced 7 years ago   3 children   Works in Aeronautical engineer    Past Surgical History:  Procedure Laterality Date  . APPENDECTOMY  1970's  . CARDIAC CATHETERIZATION  06/2012  . COLONOSCOPY  10-15 yrs ago   in [place]  . CORONARY ARTERY BYPASS GRAFT  06/29/2012   Procedure: CORONARY ARTERY BYPASS GRAFTING (CABG);  Surgeon: Melrose Nakayama, MD;  Location: [facility];  Service: Open Heart Surgery;  Laterality: N/A;  times two  using Left Internal Mammary Artery  . INGUINAL HERNIA REPAIR Bilateral [phone]  . LEFT HEART CATHETERIZATION WITH CORONARY ANGIOGRAM N/A 06/26/2012   Procedure: LEFT HEART CATHETERIZATION WITH CORONARY ANGIOGRAM;  Surgeon: Josue Hector, MD;  Location[facility] CATH LAB;  Service: Cardiovascular;  Laterality: N/A;  . LEFT HEART CATHETERIZATION WITH CORONARY/GRAFT ANGIOGRAM N/A 06/03/2014   Procedure: LEFT HEART CATHETERIZATION WITH Beatrix Fetters;  Surgeon: Jettie Booze, MD;  Location: [facility] CATH LAB;  Service: Cardiovascular;  Laterality: N/A;  . NASAL SEPTUM SURGERY     For uncontrolled epistaxis early 2013    Family History  Problem Relation Age of Onset  . Heart disease Brother        CABG age 39, redo bypass ~50  . Heart disease Paternal Grandmother   . Heart disease Cousin        Maternal side  . Heart attack Brother   . Colon cancer  Maternal Grandfather 60    No Known Allergies  Current Outpatient Medications on File Prior to Visit  Medication Sig Dispense Refill  . aspirin EC 81 MG tablet Take 1 tablet (81 mg total) by mouth daily.    Marland Kitchen atorvastatin (LIPITOR) 40 MG tablet TAKE 1 TABLET BY MOUTH EVERY DAY AT 6 PM 90 tablet 1  . Coenzyme Q10 200 MG capsule Take 200 mg by mouth every morning. disontinue while in hospital    . diltiazem (CARDIZEM CD) 300 MG 24 hr capsule Take 1 capsule (300 mg total) by mouth daily. 30 capsule 11  . diltiazem (CARDIZEM) 30 MG tablet Take 1 tablet (30 mg total) by mouth 4 (four) times daily as needed (elevated heart rates). 30 tablet 3  . fexofenadine (ALLEGRA) 180 MG tablet Take 1 tablet (180 mg total) by mouth daily. 30 tablet 2  . flecainide (TAMBOCOR) 50 MG tablet Take 1.5 tablets (75 mg total) by mouth 2 (two) times daily. 90 tablet 7  . ibuprofen (ADVIL,MOTRIN) 200 MG tablet Take 600 mg by mouth every 6 (six) hours as needed. migraine headache    . losartan (COZAAR) 50 MG tablet Take 1 tablet (50 mg total) by mouth daily. 90 tablet 3  . mometasone (NASONEX) 50 MCG/ACT nasal spray Place 2 sprays into the nose daily. 17 g 1  . nitroGLYCERIN (NITROSTAT) 0.4 MG SL tablet Place 1 tablet (0.4 mg total) under the tongue every 5 (five) minutes as needed for chest pain. 25 tablet 3  . omeprazole (PRILOSEC OTC) 20 MG tablet Take 20 mg by mouth daily.     No current facility-administered medications on file prior to visit.     BP (!) 142/70 (BP Location: Left Arm, Patient Position: Sitting, Cuff Size: Normal)   Pulse 81   Temp 97.8 F (36.6 C) (Oral)   Wt 197 lb 12.8 oz (89.7 kg)   SpO2 96%   BMI 28.18 kg/m       Objective:   Physical Exam  Constitutional: He is oriented to person, place, and time. He appears well-developed and well-nourished. No distress.  HENT:  Head: Normocephalic and atraumatic.  Right Ear: External ear normal. Tympanic membrane is not erythematous and not  bulging. A middle ear effusion is present.  Left Ear: External ear normal. Tympanic membrane is not erythematous and not bulging. A middle ear effusion is present.  Nose: Nose normal.  Mouth/Throat: Uvula is midline and oropharynx is clear and moist. No oropharyngeal exudate.  Cardiovascular: Normal rate, regular rhythm, normal heart sounds and intact distal pulses.  Pulmonary/Chest: Effort normal and breath sounds normal.  Neurological: He is alert and oriented to person, place, and time.  Skin: He is not diaphoretic.  Nursing note and vitals reviewed.     Assessment & Plan:  1. Eustachian tube dysfunction, bilateral - No signs of infection  - fluticasone (FLONASE) 50 MCG/ACT nasal spray; Place 2 sprays into both nostrils daily.  Dispense: 16 g; Refill: 6 - ok to use Afrin as well, but no longer than three days  - Follow up if no improvement in the next week or sooner if fever/pain develops   [name], NP

## 2018-07-19 ENCOUNTER — Other Ambulatory Visit: Payer: Self-pay | Admitting: Cardiology

## 2018-07-28 ENCOUNTER — Ambulatory Visit: Payer: Self-pay

## 2018-07-28 NOTE — Telephone Encounter (Signed)
Returned call to patient who states he has had flu and was getting over that and developed a sinus infection.  He was seen on Sunday at urgent care for his symptoms and placed on antibiotic Cefdinir.  He states the sinus pain has improved with the Cefdinir. He has recently noticed blood when he blows his nose. It can be small streaking to fairly bloody mucus on his tissue but the bleeding does not last long..  They happen 2-3 times per day. He has an appointment scheduled for tomorrow but was seeking advice for the bleeding. Home care advice read to patient.  Pt verbalized understanding of instructions.  Reason for Disposition . [1] Mild-moderate nosebleed AND [2] bleeding stopped now  Answer Assessment - Initial Assessment Questions 1. AMOUNT OF BLEEDING: "How bad is the bleeding?" "How much blood was lost?" "Has the bleeding stopped?"   - MILD: needed a couple tissues   - MODERATE: needed many tissues   - SEVERE: large blood clots, soaked many tissues, lasted more than 30 minutes      When pt blows nose will have spots  2. ONSET: "When did the nosebleed start?"      Sunday 3. FREQUENCY: "How many nosebleeds have you had in the last 24 hours?"      2-3 times per 4. RECURRENT SYMPTOMS: "Have there been other recent nosebleeds?" If so, ask: "How long did it take you to stop the bleeding?" "What worked best?"      Nothing needed 5. CAUSE: "What do you think caused this nosebleed?"     Sinus infection 6. LOCAL FACTORS: "Do you have any cold symptoms?", "Have you been rubbing or picking at your nose?"     Sinus infection 7. SYSTEMIC FACTORS: "Do you have high blood pressure or any bleeding problems?"     Yes 152/87 8. BLOOD THINNERS: "Do you take any blood thinners?" (e.g., coumadin, heparin, aspirin, Plavix)     81  mg ASA 9. OTHER SYMPTOMS: "Do you have any other symptoms?" (e.g., lightheadedness)     Just still weak from being sick 10. PREGNANCY: "Is there any chance you are pregnant?" "When  was your last menstrual period?"       N/A  Protocols used: [id number]

## 2018-07-29 ENCOUNTER — Encounter: Payer: Self-pay | Admitting: Family Medicine

## 2018-07-29 ENCOUNTER — Ambulatory Visit: Payer: BLUE CROSS/BLUE SHIELD | Admitting: Family Medicine

## 2018-07-29 VITALS — BP 122/82 | HR 65 | Temp 98.2°F | Resp 12 | Ht 70.25 in | Wt 199.0 lb

## 2018-07-29 DIAGNOSIS — J310 Chronic rhinitis: Secondary | ICD-10-CM | POA: Diagnosis not present

## 2018-07-29 DIAGNOSIS — R040 Epistaxis: Secondary | ICD-10-CM

## 2018-07-29 NOTE — Progress Notes (Signed)
ACUTE VISIT  HPI:  Chief Complaint  Patient presents with  . Sinus bleeding    blood in mucus    Mr.Terry Hansen is a 63 y.o.male here today complaining of 3 days of intermittent nosebleeding, usually mixed with clear mucus. Problem is exacerbated by blowing his nose. Bleeding does not happen spontaneously. Problem seems to be better today.   He has not noted gum bleeding, blood in the stool, or gross hematuria. Normal bruising than usual.  History of CAD, he is on Aspirin 81 mg daily.  About 4 days ago he was seen in acute care, diagnosed with acute sinusitis and started on cefdinir 300 mg twice daily. Facial pain has improved.  Still having nasal congestion and rhinorrhea. "Raw" throat, denies dysphasia or stridor.   Epistaxis   The bleeding has been from both nares. This is a new problem. The current episode started in the past 7 days. The problem occurs every few hours. The problem has been gradually improving. He has tried nothing for the symptoms. His past medical history is significant for allergies and sinus problems.   Hx of allergies: Allergic rhinitis, he is no longer on Allegra or Nasonex.  He does continue Flonase yesterday.  OTC medications for this problem: None.   Review of Systems  Constitutional: Negative for appetite change, chills, fatigue and fever.  HENT: Positive for congestion, nosebleeds, postnasal drip, rhinorrhea and sore throat. Negative for mouth sores, sinus pressure and trouble swallowing.   Eyes: Negative for discharge and redness.  Respiratory: Negative for cough, shortness of breath and wheezing.   Gastrointestinal: Negative for abdominal pain, blood in stool and nausea.  Skin: Negative for rash.  Allergic/Immunologic: Positive for environmental allergies.  Neurological: Negative for weakness and headaches.  Hematological: Negative for adenopathy. Does not bruise/bleed easily.      Current Outpatient Medications on File  Prior to Visit  Medication Sig Dispense Refill  . aspirin EC 81 MG tablet Take 1 tablet (81 mg total) by mouth daily.    Marland Kitchen atorvastatin (LIPITOR) 40 MG tablet TAKE 1 TABLET BY MOUTH EVERY DAY AT 6 PM 90 tablet 1  . cefdinir (OMNICEF) 300 MG capsule Take by mouth.    . Coenzyme Q10 200 MG capsule Take 200 mg by mouth every morning. disontinue while in hospital    . diltiazem (CARDIZEM CD) 300 MG 24 hr capsule TAKE ONE CAPSULE BY MOUTH DAILY 30 capsule 11  . diltiazem (CARDIZEM) 30 MG tablet Take 1 tablet (30 mg total) by mouth 4 (four) times daily as needed (elevated heart rates). 30 tablet 3  . fexofenadine (ALLEGRA) 180 MG tablet Take 1 tablet (180 mg total) by mouth daily. 30 tablet 2  . flecainide (TAMBOCOR) 50 MG tablet Take 1.5 tablets (75 mg total) by mouth 2 (two) times daily. 90 tablet 7  . fluticasone (FLONASE) 50 MCG/ACT nasal spray Place 2 sprays into both nostrils daily. 16 g 6  . ibuprofen (ADVIL,MOTRIN) 200 MG tablet Take 600 mg by mouth every 6 (six) hours as needed. migraine headache    . losartan (COZAAR) 50 MG tablet Take 1 tablet (50 mg total) by mouth daily. 90 tablet 3  . mometasone (NASONEX) 50 MCG/ACT nasal spray Place 2 sprays into the nose daily. 17 g 1  . nitroGLYCERIN (NITROSTAT) 0.4 MG SL tablet Place 1 tablet (0.4 mg total) under the tongue every 5 (five) minutes as needed for chest pain. 25 tablet 3  . omeprazole (PRILOSEC  OTC) 20 MG tablet Take 20 mg by mouth daily.     No current facility-administered medications on file prior to visit.      Past Medical History:  Diagnosis Date  . CAD (coronary artery disease)    a.  LHC 06/26/12:  pLAD 90%, prox/mid/dist CFS 20-30%, oRCA 100% (non dom), EF 55%;  b. Echo 12/13:  EF 55-60%, Gr 1 diast dysfn;  c. s/p CABG [facility]) 12/13: L-LAD/Dx    . GERD (gastroesophageal reflux disease)   . HLD (hyperlipidemia)   . Hypertension   . Ocular migraine    "maybe once q couple months" (06/02/2014)  . Unstable angina  pectoris ([facility])    No Known Allergies  Social History   Socioeconomic History  . Marital status: Divorced    Spouse name: Not on file  . Number of children: Not on file  . Years of education: Not on file  . Highest education level: Not on file  Occupational History  . Not on file  Social Needs  . Financial resource strain: Not on file  . Food insecurity:    Worry: Not on file    Inability: Not on file  . Transportation needs:    Medical: Not on file    Non-medical: Not on file  Tobacco Use  . Smoking status: Never Smoker  . Smokeless tobacco: Never Used  Substance and Sexual Activity  . Alcohol use: Yes    Alcohol/week: 1.0 standard drinks    Types: 1 Glasses of wine per week  . Drug use: No  . Sexual activity: Not Currently  Lifestyle  . Physical activity:    Days per week: Not on file    Minutes per session: Not on file  . Stress: Not on file  Relationships  . Social connections:    Talks on phone: Not on file    Gets together: Not on file    Attends religious service: Not on file    Active member of club or organization: Not on file    Attends meetings of clubs or organizations: Not on file    Relationship status: Not on file  Other Topics Concern  . Not on file  Social History Narrative   Divorced 7 years ago   3 children   Works in [place]:   07/29/18 1357  BP: 122/82  Pulse: 65  Resp: 12  Temp: 98.2 F (36.8 C)  SpO2: 98%   Body mass index is 28.35 kg/m.   Physical Exam  Nursing note and vitals reviewed. Constitutional: He is oriented to person, place, and time. He appears well-developed and well-nourished. He does not appear ill. No distress.  HENT:  Head: Normocephalic and atraumatic.  Nose: Septal deviation present. Right sinus exhibits no maxillary sinus tenderness and no frontal sinus tenderness. Left sinus exhibits no maxillary sinus tenderness and no frontal sinus tenderness.  Mouth/Throat: Oropharynx is clear  and moist and mucous membranes are normal.  Hyperemic nasal mucosa. No active bleeding appreciated. No lesions in nasal mucosa.   Eyes: Pupils are equal, round, and reactive to light. Conjunctivae are normal.  Cardiovascular: Normal rate and regular rhythm.  Respiratory: Effort normal and breath sounds normal. No respiratory distress.  Lymphadenopathy:    He has no cervical adenopathy.  Neurological: He is alert and oriented to person, place, and time. He has normal strength. Gait normal.  Skin: Skin is warm. No rash noted. No erythema.  Psychiatric: He has a normal  mood and affect.  Well groomed, good eye contact.      ASSESSMENT AND PLAN:  Ms. Jasmine was seen today for sinus bleeding.  Diagnoses and all orders for this visit:  Mild epistaxis Currently asymptomatic. We discussed possible etiologies, including medications like Flonase, allergies, and infectious process. Avoid trigger factors. It has improved, so I do not think further work-up is needed today. We discussed some side effects of Aspirin, no changes for now.  Instructed about warning signs.  Rhinitis, unspecified type Related to recent URI and/or allergic. Continue nasal irrigation with saline. Avoid intranasal steroids for now. Follow-up with PCP as needed.      Montrell Cessna G. [place], MD  [facility]. [place] office.

## 2018-08-13 ENCOUNTER — Other Ambulatory Visit: Payer: Self-pay | Admitting: Cardiovascular Disease

## 2018-09-07 ENCOUNTER — Encounter: Payer: Self-pay | Admitting: Family Medicine

## 2018-09-07 ENCOUNTER — Ambulatory Visit (INDEPENDENT_AMBULATORY_CARE_PROVIDER_SITE_OTHER): Payer: Medicare Other | Admitting: Family Medicine

## 2018-09-07 VITALS — BP 134/80 | HR 83 | Temp 98.3°F | Wt 195.4 lb

## 2018-09-07 DIAGNOSIS — J209 Acute bronchitis, unspecified: Secondary | ICD-10-CM

## 2018-09-07 MED ORDER — AZITHROMYCIN 250 MG PO TABS: ORAL_TABLET | ORAL | 0 refills | Status: DC

## 2018-09-07 NOTE — Progress Notes (Signed)
   Subjective:    Patient ID: Terry Hansen, male    DOB: 1956-01-20, 63 y.o.   MRN: [id number]  HPI Here for one week of PND, chest congestion and coughing up yellow sputum. No fever.    Review of Systems  Constitutional: Negative.   HENT: Positive for congestion and postnasal drip. Negative for sinus pressure, sinus pain and sore throat.   Eyes: Negative.   Respiratory: Positive for cough and chest tightness.        Objective:   Physical Exam Constitutional:      Appearance: Normal appearance.  HENT:     Right Ear: Tympanic membrane and ear canal normal.     Left Ear: Tympanic membrane and ear canal normal.     Nose: Nose normal.     Mouth/Throat:     Pharynx: Oropharynx is clear.  Eyes:     Conjunctiva/sclera: Conjunctivae normal.  Pulmonary:     Effort: Pulmonary effort is normal. No respiratory distress.     Breath sounds: Rhonchi present. No wheezing or rales.  Lymphadenopathy:     Cervical: No cervical adenopathy.  Neurological:     Mental Status: He is alert.           Assessment & Plan:  Bronchitis, treat with a Zpack. Use Mucinex prn.  Alysia Penna, MD

## 2018-09-16 ENCOUNTER — Telehealth: Payer: Self-pay | Admitting: *Deleted

## 2018-09-16 NOTE — Telephone Encounter (Signed)
Copied from [place] [id number]. Topic: Referral - Request for Referral >> Sep 16, 2018  4:02 PM Alanda Slim E wrote: Has patient seen PCP for this complaint? No he seen Dr. Sarajane Jews last week  *If NO, is insurance requiring patient see PCP for this issue before PCP can refer them? unknown  Referral for which specialty: ENT  Preferred provider/office: Reason for referral: recurring and ongoing right Nostril blockage

## 2018-09-16 NOTE — Telephone Encounter (Signed)
I would like him to follow up before being sent to ENT

## 2018-09-17 ENCOUNTER — Ambulatory Visit (INDEPENDENT_AMBULATORY_CARE_PROVIDER_SITE_OTHER): Payer: Medicare Other | Admitting: Adult Health

## 2018-09-17 ENCOUNTER — Encounter: Payer: Self-pay | Admitting: Adult Health

## 2018-09-17 VITALS — BP 126/82 | Temp 97.9°F | Wt 198.0 lb

## 2018-09-17 DIAGNOSIS — J0140 Acute pansinusitis, unspecified: Secondary | ICD-10-CM | POA: Diagnosis not present

## 2018-09-17 MED ORDER — DOXYCYCLINE HYCLATE 100 MG PO CAPS: 100.0000 mg | ORAL_CAPSULE | Freq: Two times a day (BID) | ORAL | 0 refills | Status: DC

## 2018-09-17 NOTE — Telephone Encounter (Signed)
Pt notified.  I have scheduled him to see Tommi Rumps today at 11:30.

## 2018-09-17 NOTE — Progress Notes (Signed)
Subjective:    Patient ID: Terry Hansen, male    DOB: Jan 03, 1956, 63 y.o.   MRN: [id number]  HPI 63 year old male who  has a past medical history of CAD (coronary artery disease), GERD (gastroesophageal reflux disease), HLD (hyperlipidemia), Hypertension, Ocular migraine, and Unstable angina pectoris (Terry Hansen).  He presents to the office today for follow up regarding URI like symptoms. Was seen by Dr. Sarajane Jews and prescribed Z pack for concern of bronchitis. He reports that chest congestion has improved but he continues to have sinus pain/pressure, nasal congestion, PND, and productive cough   Denies fevers or chills, SOB, or wheezing   Review of Systems See HPI   Past Medical History:  Diagnosis Date  . CAD (coronary artery disease)    a.  LHC 06/26/12:  pLAD 90%, prox/mid/dist CFS 20-30%, oRCA 100% (non dom), EF 55%;  b. Echo 12/13:  EF 55-60%, Gr 1 diast dysfn;  c. s/p CABG Roxan Hockey) 12/13: L-LAD/Dx    . GERD (gastroesophageal reflux disease)   . HLD (hyperlipidemia)   . Hypertension   . Ocular migraine    "maybe once q couple months" (06/02/2014)  . Unstable angina pectoris [facility])     Social History   Socioeconomic History  . Marital status: Divorced    Spouse name: Not on file  . Number of children: Not on file  . Years of education: Not on file  . Highest education level: Not on file  Occupational History  . Not on file  Social Needs  . Financial resource strain: Not on file  . Food insecurity:    Worry: Not on file    Inability: Not on file  . Transportation needs:    Medical: Not on file    Non-medical: Not on file  Tobacco Use  . Smoking status: Never Smoker  . Smokeless tobacco: Never Used  Substance and Sexual Activity  . Alcohol use: Yes    Alcohol/week: 1.0 standard drinks    Types: 1 Glasses of wine per week  . Drug use: No  . Sexual activity: Not Currently  Lifestyle  . Physical activity:    Days per week: Not on file    Minutes per session: Not on  file  . Stress: Not on file  Relationships  . Social connections:    Talks on phone: Not on file    Gets together: Not on file    Attends religious service: Not on file    Active member of club or organization: Not on file    Attends meetings of clubs or organizations: Not on file    Relationship status: Not on file  . Intimate partner violence:    Fear of current or ex partner: Not on file    Emotionally abused: Not on file    Physically abused: Not on file    Forced sexual activity: Not on file  Other Topics Concern  . Not on file  Social History Narrative   Divorced 7 years ago   3 children   Works in Aeronautical engineer    Past Surgical History:  Procedure Laterality Date  . APPENDECTOMY  1970's  . CARDIAC CATHETERIZATION  06/2012  . COLONOSCOPY  10-15 yrs ago   in [place]  . CORONARY ARTERY BYPASS GRAFT  06/29/2012   Procedure: CORONARY ARTERY BYPASS GRAFTING (CABG);  Surgeon: Melrose Nakayama, MD;  Location: [place];  Service: Open Heart Surgery;  Laterality: N/A;  times two using Left Internal Mammary  Artery  . INGUINAL HERNIA REPAIR Bilateral [phone]  . LEFT HEART CATHETERIZATION WITH CORONARY ANGIOGRAM N/A 06/26/2012   Procedure: LEFT HEART CATHETERIZATION WITH CORONARY ANGIOGRAM;  Surgeon: Josue Hector, MD;  Location: [facility] CATH LAB;  Service: Cardiovascular;  Laterality: N/A;  . LEFT HEART CATHETERIZATION WITH CORONARY/GRAFT ANGIOGRAM N/A 06/03/2014   Procedure: LEFT HEART CATHETERIZATION WITH Beatrix Fetters;  Surgeon: Jettie Booze, MD;  Location: [facility] CATH LAB;  Service: Cardiovascular;  Laterality: N/A;  . NASAL SEPTUM SURGERY     For uncontrolled epistaxis early 2013    Family History  Problem Relation Age of Onset  . Heart disease Brother        CABG age 67, redo bypass ~50  . Heart disease Paternal Grandmother   . Heart disease Cousin        Maternal side  . Heart attack Brother   . Colon cancer Maternal Grandfather 60    No  Known Allergies  Current Outpatient Medications on File Prior to Visit  Medication Sig Dispense Refill  . aspirin EC 81 MG tablet Take 1 tablet (81 mg total) by mouth daily.    Marland Kitchen atorvastatin (LIPITOR) 40 MG tablet TAKE 1 TABLET BY MOUTH EVERY DAY AT 6 PM 90 tablet 1  . Cetirizine HCl (ZYRTEC PO) Take 10 mg by mouth daily. [facility] Brand    . Coenzyme Q10 200 MG capsule Take 200 mg by mouth every morning. disontinue while in hospital    . diltiazem (CARDIZEM CD) 300 MG 24 hr capsule TAKE ONE CAPSULE BY MOUTH DAILY 30 capsule 11  . diltiazem (CARDIZEM) 30 MG tablet Take 1 tablet (30 mg total) by mouth 4 (four) times daily as needed (elevated heart rates). 30 tablet 3  . flecainide (TAMBOCOR) 50 MG tablet Take 1.5 tablets (75 mg total) by mouth 2 (two) times daily. 90 tablet 7  . fluticasone (FLONASE) 50 MCG/ACT nasal spray Place 2 sprays into both nostrils daily. 16 g 6  . ibuprofen (ADVIL,MOTRIN) 200 MG tablet Take 600 mg by mouth every 6 (six) hours as needed. migraine headache    . losartan (COZAAR) 50 MG tablet TAKE 1 TABLET(50 MG) BY MOUTH DAILY 90 tablet 2  . mometasone (NASONEX) 50 MCG/ACT nasal spray Place 2 sprays into the nose daily. 17 g 1  . nitroGLYCERIN (NITROSTAT) 0.4 MG SL tablet Place 1 tablet (0.4 mg total) under the tongue every 5 (five) minutes as needed for chest pain. 25 tablet 3  . omeprazole (PRILOSEC OTC) 20 MG tablet Take 20 mg by mouth daily.     No current facility-administered medications on file prior to visit.     BP 126/82   Temp 97.9 F (36.6 C)   Wt 198 lb (89.8 kg)   BMI 28.21 kg/m       Objective:   Physical Exam Vitals signs and nursing note reviewed.  Constitutional:      Appearance: Normal appearance.  HENT:     Right Ear: Tympanic membrane, ear canal and external ear normal. There is no impacted cerumen.     Left Ear: Tympanic membrane, ear canal and external ear normal. There is no impacted cerumen.     Nose: Congestion and rhinorrhea  present.     Right Turbinates: Enlarged and swollen.     Left Turbinates: Enlarged and swollen.     Right Sinus: Maxillary sinus tenderness and frontal sinus tenderness present.     Left Sinus: Maxillary sinus tenderness and frontal sinus tenderness present.  Mouth/Throat:     Mouth: Mucous membranes are moist.     Pharynx: Oropharynx is clear.  Neurological:     Mental Status: He is alert.       Assessment & Plan:  1. Acute non-recurrent pansinusitis - abx failure  - doxycycline (VIBRAMYCIN) 100 MG capsule; Take 1 capsule (100 mg total) by mouth 2 (two) times daily.  Dispense: 14 capsule; Refill: 0   [name], NP

## 2018-10-13 ENCOUNTER — Encounter: Payer: Self-pay | Admitting: *Deleted

## 2018-10-15 ENCOUNTER — Other Ambulatory Visit: Payer: Self-pay | Admitting: Adult Health

## 2018-10-15 NOTE — Telephone Encounter (Signed)
Sent to the pharmacy by e-scribe. 

## 2018-10-20 ENCOUNTER — Telehealth: Payer: Self-pay | Admitting: *Deleted

## 2018-10-20 NOTE — Telephone Encounter (Signed)
Called patient to let them know due to recent [place] and Health Department Protocols, we are not seeing patients in the office. We are instead seeing if they would like to schedule this appointment as a Research scientist (medical) or Laptop. Patient is aware if they decide to reschedule this appointment, they may not be seen or scheduled for the next 4-6 months. Patient will try to upload app on smartphone. He does not have regular access to the internet. He understands if he is not able to use this app he will have to reschedule his appointment at a later date.

## 2018-10-20 NOTE — Telephone Encounter (Addendum)
Followed up with pt. Pt reports intermediate fluttering sensations.  States that he feels like this is stress & anxiety related d/t current pandemic. States that he isn't concerned about it b/c it is probably r/t anxiety.  Stated that it took a couple of days for his body/muscles to recover after what he thinks what a bad anxiety attack. Pt does state that the "flutters are worse than before" but not concerning to him. Pt aware that I don't think there is concern from what he reported but that I think a face to face, web-ex, visit next week would be a good idea. Pt agreeable and will download app onto his Android phone. Pt aware office will follow up before appt to make sure he was able to successfully download app and understands how to use it. We can even do a mock visit with him to ensure he understands how the visit will go next week. Pt agreeable to plan.

## 2018-10-20 NOTE — Telephone Encounter (Signed)
Patient experience moments of palpitations and flutters when stressed or during the night when suddenly woke up. Moments last about 30-40 seconds then goes away.

## 2018-10-28 ENCOUNTER — Telehealth: Payer: Self-pay | Admitting: Cardiology

## 2018-10-28 NOTE — Telephone Encounter (Signed)
Pt would prefer to be seen in person in the office. Reports feeling fine with no issues.Encouraged to call the office if he experiences any issues with his heart until we see him in August.   Scheduled for 03/02/2019. Patient verbalized understanding and agreeable to plan.

## 2018-10-28 NOTE — Telephone Encounter (Signed)
New Message    Pt is calling back to speak to Sherri    Please call back

## 2018-10-29 ENCOUNTER — Telehealth: Payer: Medicare Other | Admitting: Cardiology

## 2018-11-16 ENCOUNTER — Other Ambulatory Visit: Payer: Self-pay | Admitting: Cardiology

## 2019-01-13 ENCOUNTER — Other Ambulatory Visit: Payer: Self-pay

## 2019-01-13 ENCOUNTER — Encounter: Payer: Self-pay | Admitting: Adult Health

## 2019-01-13 ENCOUNTER — Ambulatory Visit (INDEPENDENT_AMBULATORY_CARE_PROVIDER_SITE_OTHER): Payer: Medicare Other | Admitting: Adult Health

## 2019-01-13 VITALS — BP 130/80 | Temp 97.7°F | Wt 194.0 lb

## 2019-01-13 DIAGNOSIS — L309 Dermatitis, unspecified: Secondary | ICD-10-CM

## 2019-01-13 NOTE — Progress Notes (Signed)
Subjective:    Patient ID: Terry Hansen, male    DOB: [birth year], 63 y.o.   MRN: [id number]  HPI  63 year old male who  has a past medical history of CAD (coronary artery disease), GERD (gastroesophageal reflux disease), HLD (hyperlipidemia), Hypertension, Ocular migraine, and Unstable angina pectoris ([place]).  He presents to the office today for an acute issue. He is concerned about two itchy areas on the right side at the base of his neck. He reports that the areas have been present for an unknown amount of time but they are not going away. Areas are itchy and dry. Has not noticed any scabs. He has been applying OTC cortisone cream and notices this helps with his symptoms.   Review of Systems See HPI   Past Medical History:  Diagnosis Date  . CAD (coronary artery disease)    a.  LHC 06/26/12:  pLAD 90%, prox/mid/dist CFS 20-30%, oRCA 100% (non dom), EF 55%;  b. Echo 12/13:  EF 55-60%, Gr 1 diast dysfn;  c. s/p CABG Roxan Hockey) 12/13: L-LAD/Dx    . GERD (gastroesophageal reflux disease)   . HLD (hyperlipidemia)   . Hypertension   . Ocular migraine    "maybe once q couple months" (06/02/2014)  . Unstable angina pectoris [facility])     Social History   Socioeconomic History  . Marital status: Divorced    Spouse name: Not on file  . Number of children: Not on file  . Years of education: Not on file  . Highest education level: Not on file  Occupational History  . Not on file  Social Needs  . Financial resource strain: Not on file  . Food insecurity    Worry: Not on file    Inability: Not on file  . Transportation needs    Medical: Not on file    Non-medical: Not on file  Tobacco Use  . Smoking status: Never Smoker  . Smokeless tobacco: Never Used  Substance and Sexual Activity  . Alcohol use: Yes    Alcohol/week: 1.0 standard drinks    Types: 1 Glasses of wine per week  . Drug use: No  . Sexual activity: Not Currently  Lifestyle  . Physical activity    Days per week:  Not on file    Minutes per session: Not on file  . Stress: Not on file  Relationships  . Social Herbalist on phone: Not on file    Gets together: Not on file    Attends religious service: Not on file    Active member of club or organization: Not on file    Attends meetings of clubs or organizations: Not on file    Relationship status: Not on file  . Intimate partner violence    Fear of current or ex partner: Not on file    Emotionally abused: Not on file    Physically abused: Not on file    Forced sexual activity: Not on file  Other Topics Concern  . Not on file  Social History Narrative   Divorced 7 years ago   3 children   Works in Aeronautical engineer    Past Surgical History:  Procedure Laterality Date  . APPENDECTOMY  1970's  . CARDIAC CATHETERIZATION  06/2012  . COLONOSCOPY  10-15 yrs ago   in [place]  . CORONARY ARTERY BYPASS GRAFT  06/29/2012   Procedure: CORONARY ARTERY BYPASS GRAFTING (CABG);  Surgeon: Melrose Nakayama, MD;  Location:  [place] [facility];  Service: Open Heart Surgery;  Laterality: N/A;  times two using Left Internal Mammary Artery  . INGUINAL HERNIA REPAIR Bilateral [phone]  . LEFT HEART CATHETERIZATION WITH CORONARY ANGIOGRAM N/A 06/26/2012   Procedure: LEFT HEART CATHETERIZATION WITH CORONARY ANGIOGRAM;  Surgeon: Josue Hector, MD;  Location: [facility] CATH LAB;  Service: Cardiovascular;  Laterality: N/A;  . LEFT HEART CATHETERIZATION WITH CORONARY/GRAFT ANGIOGRAM N/A 06/03/2014   Procedure: LEFT HEART CATHETERIZATION WITH Beatrix Fetters;  Surgeon: Jettie Booze, MD;  Location: [facility] CATH LAB;  Service: Cardiovascular;  Laterality: N/A;  . NASAL SEPTUM SURGERY     For uncontrolled epistaxis early 2013    Family History  Problem Relation Age of Onset  . Heart disease Brother        CABG age 84, redo bypass ~50  . Heart disease Paternal Grandmother   . Heart disease Cousin        Maternal side  . Heart attack Brother   .  Colon cancer Maternal Grandfather 60    No Known Allergies  Current Outpatient Medications on File Prior to Visit  Medication Sig Dispense Refill  . aspirin EC 81 MG tablet Take 1 tablet (81 mg total) by mouth daily.    Marland Kitchen atorvastatin (LIPITOR) 40 MG tablet TAKE 1 TABLET BY MOUTH EVERY DAY AT 6 PM 90 tablet 1  . Cetirizine HCl (ZYRTEC PO) Take 10 mg by mouth daily. [facility] Brand    . Coenzyme Q10 200 MG capsule Take 200 mg by mouth every morning. disontinue while in hospital    . diltiazem (CARDIZEM CD) 300 MG 24 hr capsule TAKE ONE CAPSULE BY MOUTH DAILY 30 capsule 11  . diltiazem (CARDIZEM) 30 MG tablet Take 1 tablet (30 mg total) by mouth 4 (four) times daily as needed (elevated heart rates). 30 tablet 3  . doxycycline (VIBRAMYCIN) 100 MG capsule Take 1 capsule (100 mg total) by mouth 2 (two) times daily. 14 capsule 0  . flecainide (TAMBOCOR) 50 MG tablet TAKE 1 AND 1/2 TABLETS(75 MG) BY MOUTH TWICE DAILY 252 tablet 0  . fluticasone (FLONASE) 50 MCG/ACT nasal spray Place 2 sprays into both nostrils daily. 16 g 6  . ibuprofen (ADVIL,MOTRIN) 200 MG tablet Take 600 mg by mouth every 6 (six) hours as needed. migraine headache    . losartan (COZAAR) 50 MG tablet TAKE 1 TABLET(50 MG) BY MOUTH DAILY 90 tablet 2  . mometasone (NASONEX) 50 MCG/ACT nasal spray Place 2 sprays into the nose daily. 17 g 1  . nitroGLYCERIN (NITROSTAT) 0.4 MG SL tablet Place 1 tablet (0.4 mg total) under the tongue every 5 (five) minutes as needed for chest pain. 25 tablet 3  . omeprazole (PRILOSEC OTC) 20 MG tablet Take 20 mg by mouth daily.     No current facility-administered medications on file prior to visit.     BP 130/80   Temp 97.7 F (36.5 C)   Wt 194 lb (88 kg)   BMI 27.64 kg/m       Objective:   Physical Exam Vitals signs and nursing note reviewed.  Constitutional:      Appearance: Normal appearance.  Skin:    General: Skin is warm and dry.     Comments: Two small patches of eczema noted,  one on the right base of beck and the other on the back of the neck.   Neurological:     General: No focal deficit present.     Mental Status: He is alert.  Assessment & Plan:  1. Eczema, unspecified type - Continue with cortisone cream PRN - Can also use moisurizing lotion.   [name], NP

## 2019-01-25 ENCOUNTER — Encounter: Payer: Self-pay | Admitting: Gastroenterology

## 2019-01-27 ENCOUNTER — Other Ambulatory Visit: Payer: Self-pay

## 2019-01-27 ENCOUNTER — Ambulatory Visit (INDEPENDENT_AMBULATORY_CARE_PROVIDER_SITE_OTHER): Payer: Medicare Other | Admitting: Adult Health

## 2019-01-27 ENCOUNTER — Ambulatory Visit: Payer: Self-pay | Admitting: *Deleted

## 2019-01-27 DIAGNOSIS — R5383 Other fatigue: Secondary | ICD-10-CM

## 2019-01-27 DIAGNOSIS — F32A Depression, unspecified: Secondary | ICD-10-CM

## 2019-01-27 DIAGNOSIS — R1032 Left lower quadrant pain: Secondary | ICD-10-CM | POA: Diagnosis not present

## 2019-01-27 DIAGNOSIS — F329 Major depressive disorder, single episode, unspecified: Secondary | ICD-10-CM

## 2019-01-27 NOTE — Telephone Encounter (Signed)
Pt scheduled with PCP

## 2019-01-27 NOTE — Telephone Encounter (Signed)
Call to office- patient is aware that even if he is seen - he may be directed to ED for  evaluation of his symptoms since he is having multiple symptoms for extended period of time and he is feeling fatigued/weak.  Reason for Disposition . [1] MODERATE pain (e.g., interferes with normal activities) AND [2] pain comes and goes (cramps) AND [3] present > 24 hours  (Exception: pain with Vomiting or Diarrhea - see that Guideline)  Answer Assessment - Initial Assessment Questions 1. LOCATION: "Where does it hurt?"      Left side to lower abdomen 2. RADIATION: "Does the pain shoot anywhere else?" (e.g., chest, back)     No- only to lower abdomen 3. ONSET: "When did the pain begin?" (Minutes, hours or days ago)      Sunday- Monday was worse 4. SUDDEN: "Gradual or sudden onset?"     Sudden- has not felt good for several days 5. PATTERN "Does the pain come and go, or is it constant?"    - If constant: "Is it getting better, staying the same, or worsening?"      (Note: Constant means the pain never goes away completely; most serious pain is constant and it progresses)     - If intermittent: "How long does it last?" "Do you have pain now?"     (Note: Intermittent means the pain goes away completely between bouts)     Today- comes and goes 6. SEVERITY: "How bad is the pain?"  (e.g., Scale 1-10; mild, moderate, or severe)    - MILD (1-3): doesn't interfere with normal activities, abdomen soft and not tender to touch     - MODERATE (4-7): interferes with normal activities or awakens from sleep, tender to touch     - SEVERE (8-10): excruciating pain, doubled over, unable to do any normal activities       Moderate-4-5 7. RECURRENT SYMPTOM: "Have you ever had this type of abdominal pain before?" If so, ask: "When was the last time?" and "What happened that time?"      no 8. CAUSE: "What do you think is causing the abdominal pain?"     ? COVID- colon problem 9. RELIEVING/AGGRAVATING FACTORS: "What makes  it better or worse?" (e.g., movement, antacids, bowel movement)     No pain yesterday- patient did taken omeprazole  10. OTHER SYMPTOMS: "Has there been any vomiting, diarrhea, constipation, or urine problems?"       Nausea, fatigue, going small amounts- BM-stools seem normal- last BM this morning- not eating alot  Protocols used: ABDOMINAL PAIN - MALE-A-AH

## 2019-01-29 ENCOUNTER — Encounter: Payer: Self-pay | Admitting: Adult Health

## 2019-01-29 ENCOUNTER — Telehealth: Payer: Self-pay | Admitting: Family Medicine

## 2019-01-29 NOTE — Telephone Encounter (Signed)
Spoke with patient.  Advised him of recommendation from [name], NP regarding taking stool softener this weekend, and contacting office if not better next week.  Patient agreeable.  He did want me to let Tommi Rumps know that he did get some relief today around 11am, and is feeling a little bit better this afternoon.

## 2019-01-29 NOTE — Telephone Encounter (Signed)
Left a message for a return call.

## 2019-01-29 NOTE — Telephone Encounter (Signed)
Please advise pt of [facility] recommendations.  Pt has called 3 times and unable to speak with nurse.

## 2019-01-29 NOTE — Telephone Encounter (Signed)
Copied from [place] [id number]. Topic: General - Inquiry >> Jan 29, 2019  7:49 AM Scherrie Gerlach wrote: Reason for CRM: pt called this am stating he is not any better.  Pt had virtual visit Wed, and states Tommi Rumps is aware of his issue. Pt states he had a rough night last night, pain continues. Pt would like to know what Tommi Rumps would have him do now.  Pt states what is going in does not match what is coming out.

## 2019-01-29 NOTE — Progress Notes (Signed)
Virtual Visit via Video Note  I connected with Georgie Chard on 01/27/2019 at  3:30 PM EDT by a video enabled telemedicine application and verified that I am speaking with the correct person using two identifiers.  Location patient: home Location provider:work or home office Persons participating in the virtual visit: patient, provider  I discussed the limitations of evaluation and management by telemedicine and the availability of in person appointments. The patient expressed understanding and agreed to proceed.   HPI: 63 year old male who is being evaluated today for an acute issue of abdominal pain and fatigue.  Reports left lower quadrant intermittent abdominal pain started 4 nights ago.  Was the last 2 days his pain has resolved.  He does report a bowel movement this morning and having flatulence.  Denies nausea, vomiting, or diarrhea.  He has not had any fevers or chills.  He did take a couple of days of omeprazole which seemed to help with his symptoms.  As for the fatigue this is been several weeks in the making.  Feels drained".  Feels as though this is the result of some "situational depression" that he is experiencing.  He lives alone and due to the [place] pandemic he has been unable to do things that he once enjoyed.  He is not getting out of the house much except to go to the store.  He is sleeping throughout the night and denies any suicidal ideation.   ROS: See pertinent positives and negatives per HPI.  Past Medical History:  Diagnosis Date  . CAD (coronary artery disease)    a.  LHC 06/26/12:  pLAD 90%, prox/mid/dist CFS 20-30%, oRCA 100% (non dom), EF 55%;  b. Echo 12/13:  EF 55-60%, Gr 1 diast dysfn;  c. s/p CABG [facility]) 12/13: L-LAD/Dx    . GERD (gastroesophageal reflux disease)   . HLD (hyperlipidemia)   . Hypertension   . Ocular migraine    "maybe once q couple months" (06/02/2014)  . Unstable angina pectoris [facility])     Past Surgical History:  Procedure  Laterality Date  . APPENDECTOMY  1970's  . CARDIAC CATHETERIZATION  06/2012  . COLONOSCOPY  10-15 yrs ago   in [place]  . CORONARY ARTERY BYPASS GRAFT  06/29/2012   Procedure: CORONARY ARTERY BYPASS GRAFTING (CABG);  Surgeon: Melrose Nakayama, MD;  Location: [facility];  Service: Open Heart Surgery;  Laterality: N/A;  times two using Left Internal Mammary Artery  . INGUINAL HERNIA REPAIR Bilateral [phone]  . LEFT HEART CATHETERIZATION WITH CORONARY ANGIOGRAM N/A 06/26/2012   Procedure: LEFT HEART CATHETERIZATION WITH CORONARY ANGIOGRAM;  Surgeon: Josue Hector, MD;  Location: [facility] CATH LAB;  Service: Cardiovascular;  Laterality: N/A;  . LEFT HEART CATHETERIZATION WITH CORONARY/GRAFT ANGIOGRAM N/A 06/03/2014   Procedure: LEFT HEART CATHETERIZATION WITH Beatrix Fetters;  Surgeon: Jettie Booze, MD;  Location: [facility] CATH LAB;  Service: Cardiovascular;  Laterality: N/A;  . NASAL SEPTUM SURGERY     For uncontrolled epistaxis early 2013    Family History  Problem Relation Age of Onset  . Heart disease Brother        CABG age 33, redo bypass ~50  . Heart disease Paternal Grandmother   . Heart disease Cousin        Maternal side  . Heart attack Brother   . Colon cancer Maternal Grandfather 60      Current Outpatient Medications:  .  aspirin EC 81 MG tablet, Take 1 tablet (81 mg total) by mouth  daily., Disp: , Rfl:  .  atorvastatin (LIPITOR) 40 MG tablet, TAKE 1 TABLET BY MOUTH EVERY DAY AT 6 PM, Disp: 90 tablet, Rfl: 1 .  Cetirizine HCl (ZYRTEC PO), Take 10 mg by mouth daily. [facility] Brand, Disp: , Rfl:  .  Coenzyme Q10 200 MG capsule, Take 200 mg by mouth every morning. disontinue while in hospital, Disp: , Rfl:  .  diltiazem (CARDIZEM CD) 300 MG 24 hr capsule, TAKE ONE CAPSULE BY MOUTH DAILY, Disp: 30 capsule, Rfl: 11 .  diltiazem (CARDIZEM) 30 MG tablet, Take 1 tablet (30 mg total) by mouth 4 (four) times daily as needed (elevated heart rates)., Disp: 30 tablet, Rfl:  3 .  doxycycline (VIBRAMYCIN) 100 MG capsule, Take 1 capsule (100 mg total) by mouth 2 (two) times daily., Disp: 14 capsule, Rfl: 0 .  flecainide (TAMBOCOR) 50 MG tablet, TAKE 1 AND 1/2 TABLETS(75 MG) BY MOUTH TWICE DAILY, Disp: 252 tablet, Rfl: 0 .  fluticasone (FLONASE) 50 MCG/ACT nasal spray, Place 2 sprays into both nostrils daily., Disp: 16 g, Rfl: 6 .  ibuprofen (ADVIL,MOTRIN) 200 MG tablet, Take 600 mg by mouth every 6 (six) hours as needed. migraine headache, Disp: , Rfl:  .  losartan (COZAAR) 50 MG tablet, TAKE 1 TABLET(50 MG) BY MOUTH DAILY, Disp: 90 tablet, Rfl: 2 .  mometasone (NASONEX) 50 MCG/ACT nasal spray, Place 2 sprays into the nose daily., Disp: 17 g, Rfl: 1 .  nitroGLYCERIN (NITROSTAT) 0.4 MG SL tablet, Place 1 tablet (0.4 mg total) under the tongue every 5 (five) minutes as needed for chest pain., Disp: 25 tablet, Rfl: 3 .  omeprazole (PRILOSEC OTC) 20 MG tablet, Take 20 mg by mouth daily., Disp: , Rfl:   EXAM:  VITALS per patient if applicable:  GENERAL: alert, oriented, appears well and in no acute distress  HEENT: atraumatic, conjunttiva clear, no obvious abnormalities on inspection of external nose and ears  NECK: normal movements of the head and neck  LUNGS: on inspection no signs of respiratory distress, breathing rate appears normal, no obvious gross SOB, gasping or wheezing  CV: no obvious cyanosis  MS: moves all visible extremities without noticeable abnormality  PSYCH/NEURO: pleasant and cooperative, no obvious depression or anxiety, speech and thought processing grossly intact  ASSESSMENT AND PLAN:  Discussed the following assessment and plan:  1. LLQ pain -Has resolved.  Doubt diverticulitis or other infection, or bowel obstruction.  Advised to continue to use omeprazole for the next 7 to 10 days if he felt like this helped.  Aloe up as needed  2. Fatigue due to depression -Discussed possibly starting medication for depression, he does not want to  do this currently.  He was advised to get out of the house and at least go long a walk for 30 minutes a day.  He is planning on going to the beach this weekend to play golf with some friends which I think be a good idea.  Follow-up as needed     I discussed the assessment and treatment plan with the patient. The patient was provided an opportunity to ask questions and all were answered. The patient agreed with the plan and demonstrated an understanding of the instructions.   The patient was advised to call back or seek an in-person evaluation if the symptoms worsen or if the condition fails to improve as anticipated.   [name], NP

## 2019-01-29 NOTE — Telephone Encounter (Addendum)
if Terry Hansen calls back tell him that he may be a little constipated. Have him take a stool softener over the weekend and follow up next week if no improveemnt   COPIED [place].

## 2019-01-29 NOTE — Telephone Encounter (Signed)
CRM created for [facility] nurse to release information to pt.

## 2019-01-29 NOTE — Telephone Encounter (Signed)
Left a message on identified voicemail informing the pt that Terry Hansen would like to see him IN THE OFFICE.  Advised a call back to schedule that appointment.

## 2019-01-29 NOTE — Telephone Encounter (Signed)
Lets see him in the office.

## 2019-02-01 DIAGNOSIS — R1084 Generalized abdominal pain: Secondary | ICD-10-CM | POA: Diagnosis not present

## 2019-02-01 DIAGNOSIS — K5900 Constipation, unspecified: Secondary | ICD-10-CM | POA: Diagnosis not present

## 2019-02-08 ENCOUNTER — Other Ambulatory Visit: Payer: Self-pay | Admitting: Cardiology

## 2019-02-19 ENCOUNTER — Other Ambulatory Visit: Payer: Self-pay

## 2019-02-19 ENCOUNTER — Ambulatory Visit (AMBULATORY_SURGERY_CENTER): Payer: Self-pay | Admitting: *Deleted

## 2019-02-19 VITALS — Ht 71.0 in | Wt 192.0 lb

## 2019-02-19 DIAGNOSIS — R195 Other fecal abnormalities: Secondary | ICD-10-CM

## 2019-02-19 MED ORDER — PEG 3350-KCL-NA BICARB-NACL 420 G PO SOLR: 4000.0000 mL | Freq: Once | ORAL | 0 refills | Status: AC

## 2019-02-19 NOTE — Progress Notes (Signed)
Patient's pre-visit was done today over the phone with the patient due to COVID-19 pandemic. Name,DOB and address verified. Insurance verified. Packet of Prep instructions mailed to patient including copy of a consent form and pre-procedure patient acknowledgement form-pt is aware.Patient understands to call us back with any questions or concerns.  Patient denies any allergies to eggs or soy. Patient denies any problems with anesthesia/sedation. Patient denies any oxygen use at home. Patient denies taking any diet/weight loss medications or blood thinners. EMMI education assisgned to patient on colonoscopy, this was explained and instructions given to patient.Pt is aware that care partner will wait in the car during procedure; if they feel like they will be too hot to wait in the car; they may wait in the lobby.  We want them to wear a mask (we do not have any that we can provide them), practice social distancing, and we will check their temperatures when they get here.  I did remind patient that their care partner needs to stay in the parking lot the entire time. Pt will wear mask into building.

## 2019-03-02 ENCOUNTER — Ambulatory Visit (INDEPENDENT_AMBULATORY_CARE_PROVIDER_SITE_OTHER): Payer: Medicare Other | Admitting: Cardiology

## 2019-03-02 ENCOUNTER — Other Ambulatory Visit: Payer: Self-pay

## 2019-03-02 ENCOUNTER — Encounter: Payer: Self-pay | Admitting: Cardiology

## 2019-03-02 VITALS — BP 132/82 | HR 73 | Ht 71.0 in | Wt 196.0 lb

## 2019-03-02 DIAGNOSIS — I471 Supraventricular tachycardia: Secondary | ICD-10-CM | POA: Diagnosis not present

## 2019-03-02 NOTE — Patient Instructions (Addendum)
Medication Instructions:  Your physician has recommended you make the following change in your medication:  1. STOP Flecainide 2. STOP Diltiazem  * If you need a refill on your cardiac medications before your next appointment, please call your pharmacy.   Labwork: None ordered  Testing/Procedures: None ordered  Follow-Up: Your physician recommends that you schedule a follow-up appointment in: 3 months with Dr. Curt Bears.  Thank you for choosing [facility]!!   Trinidad Curet, RN [phone]

## 2019-03-02 NOTE — Progress Notes (Signed)
Electrophysiology Office Note   Date:  03/02/2019   ID:  Terry Hansen, DOB 1956/06/29, MRN [id number]  PCP:  [name], NP  Cardiologist: Johnsie Cancel  Primary Electrophysiologist:  Clydene Burack Meredith Leeds, MD    No chief complaint on file.    History of Present Illness: Terry Hansen is a 63 y.o. male who presents today for electrophysiology evaluation.   He presented to the hospital at the end of March with palpitations and was found to have an atrial tachycardia and multiple short runs of tachycardia. At that time, he was started on metoprolol 75 mg seems to control his symptoms. He re-presented to the hospital on 4/10 with similar symptoms.  His metoprolol was sent stopped and his diltiazem was increased due to fatigue.  Today, denies symptoms of palpitations, chest pain, shortness of breath, orthopnea, PND, lower extremity edema, claudication, dizziness, presyncope, syncope, bleeding, or neurologic sequela. The patient is tolerating medications without difficulties.     Past Medical History:  Diagnosis Date  . CAD (coronary artery disease)    a.  LHC 06/26/12:  pLAD 90%, prox/mid/dist CFS 20-30%, oRCA 100% (non dom), EF 55%;  b. Echo 12/13:  EF 55-60%, Gr 1 diast dysfn;  c. s/p CABG [facility]) 12/13: L-LAD/Dx    . GERD (gastroesophageal reflux disease)   . HLD (hyperlipidemia)   . Hypertension   . Ocular migraine    "maybe once q couple months" (06/02/2014)  . Unstable angina pectoris [facility])    Past Surgical History:  Procedure Laterality Date  . APPENDECTOMY  1970's  . CARDIAC CATHETERIZATION  06/2012  . CORONARY ARTERY BYPASS GRAFT  06/29/2012   Procedure: CORONARY ARTERY BYPASS GRAFTING (CABG);  Surgeon: Melrose Nakayama, MD;  Location: [facility];  Service: Open Heart Surgery;  Laterality: N/A;  times two using Left Internal Mammary Artery  . INGUINAL HERNIA REPAIR Bilateral [phone]  . LEFT HEART CATHETERIZATION WITH CORONARY ANGIOGRAM N/A 06/26/2012   Procedure: LEFT  HEART CATHETERIZATION WITH CORONARY ANGIOGRAM;  Surgeon: Josue Hector, MD;  Location: [facility] CATH LAB;  Service: Cardiovascular;  Laterality: N/A;  . LEFT HEART CATHETERIZATION WITH CORONARY/GRAFT ANGIOGRAM N/A 06/03/2014   Procedure: LEFT HEART CATHETERIZATION WITH Beatrix Fetters;  Surgeon: Jettie Booze, MD;  Location: [facility] CATH LAB;  Service: Cardiovascular;  Laterality: N/A;  . NASAL SEPTUM SURGERY     For uncontrolled epistaxis early 2013  . SIGMOIDOSCOPY  10-15 years ago   in [place], [place]     Current Outpatient Medications  Medication Sig Dispense Refill  . aspirin EC 81 MG tablet Take 1 tablet (81 mg total) by mouth daily.    Marland Kitchen atorvastatin (LIPITOR) 40 MG tablet TAKE 1 TABLET BY MOUTH EVERY DAY AT 6 PM 90 tablet 1  . Cetirizine HCl (ZYRTEC PO) Take 10 mg by mouth daily. [facility] Brand    . Coenzyme Q10 200 MG capsule Take 200 mg by mouth every morning. disontinue while in hospital    . diltiazem (CARDIZEM CD) 300 MG 24 hr capsule TAKE ONE CAPSULE BY MOUTH DAILY 30 capsule 11  . diltiazem (CARDIZEM) 30 MG tablet Take 1 tablet (30 mg total) by mouth 4 (four) times daily as needed (elevated heart rates). 30 tablet 3  . flecainide (TAMBOCOR) 50 MG tablet TAKE 1 AND 1/2 TABLETS(75 MG) BY MOUTH TWICE DAILY 270 tablet 0  . fluticasone (FLONASE) 50 MCG/ACT nasal spray Place 2 sprays into both nostrils daily. 16 g 6  . ibuprofen (ADVIL,MOTRIN) 200 MG tablet Take  600 mg by mouth every 6 (six) hours as needed. migraine headache    . losartan (COZAAR) 50 MG tablet TAKE 1 TABLET(50 MG) BY MOUTH DAILY 90 tablet 2  . mometasone (NASONEX) 50 MCG/ACT nasal spray Place 2 sprays into the nose daily. 17 g 1  . nitroGLYCERIN (NITROSTAT) 0.4 MG SL tablet Place 1 tablet (0.4 mg total) under the tongue every 5 (five) minutes as needed for chest pain. 25 tablet 3  . omeprazole (PRILOSEC OTC) 20 MG tablet Take 20 mg by mouth daily.     No current facility-administered medications for this  visit.     Allergies:   Patient has no known allergies.   Social History:  The patient  reports that he has never smoked. He has never used smokeless tobacco. He reports current alcohol use. He reports that he does not use drugs.   Family History:  The patient's family history includes Colon cancer (age of onset: 14) in his maternal grandfather; Heart attack in his brother; Heart disease in his brother, cousin, and paternal grandmother; Lung cancer in his maternal grandfather.    ROS:  Please see the history of present illness.   Otherwise, review of systems is positive for none.   All other systems are reviewed and negative.   PHYSICAL EXAM: VS:  BP 132/82   Pulse 73   Ht 5\' 11"  (1.803 m)   Wt 196 lb (88.9 kg)   BMI 27.34 kg/m  , BMI Body mass index is 27.34 kg/m. GEN: Well nourished, well developed, in no acute distress  HEENT: normal  Neck: no JVD, carotid bruits, or masses Cardiac: RRR; no murmurs, rubs, or gallops,no edema  Respiratory:  clear to auscultation bilaterally, normal work of breathing GI: soft, nontender, nondistended, + BS MS: no deformity or atrophy  Skin: warm and dry Neuro:  Strength and sensation are intact Psych: euthymic mood, full affect  EKG:  EKG is ordered today. Personal review of the ekg ordered shows SR, iRBBB   Recent Labs: 03/04/2018: TSH 1.28 03/31/2018: ALT 24; BUN 10; Creatinine, Ser 0.98; Hemoglobin 15.7; Platelets 273.0; Potassium 4.3; Sodium 138    Lipid Panel     Component Value Date/Time   CHOL 110 03/31/2018 0935   TRIG 45.0 03/31/2018 0935   HDL 46.10 03/31/2018 0935   CHOLHDL 2 03/31/2018 0935   VLDL 9.0 03/31/2018 0935   LDLCALC 55 03/31/2018 0935     Wt Readings from Last 3 Encounters:  03/02/19 196 lb (88.9 kg)  02/19/19 192 lb (87.1 kg)  01/13/19 194 lb (88 kg)      Other studies Reviewed: Additional studies/ records that were reviewed today include: Nuclear stress 11/02/15  Review of the above records today  demonstrates:   Nuclear stress EF: 41%.  There was no ST segment deviation noted during stress.  Defect 1: There is a large defect of moderate severity present in the mid anterior, mid inferolateral, apical anterior, apical lateral and apex location.  Findings consistent with prior anteriolateral myocardial infarction. There is no evidence of ischemia.  This is an intermediate risk study.  TTE 10/20/15 - Left ventricle: The cavity size was normal. Systolic function was  normal. The estimated ejection fraction was in the range of 60%  to 65%. Wall motion was normal; there were no regional wall  motion abnormalities. - Aortic valve: Possibly bicuspid; severely thickened, severely  calcified leaflets. Valve mobility was restricted. There was mild  stenosis. There was mild regurgitation. Peak gradient (S):  33 mm  Hg. Valve area (VTI): 1.86 cm^2. Valve area (Vmax): 2.65 cm^2.  Valve area (Vmean): 2.67 cm^2. - Aortic root: The aortic root was normal in size. - Mitral valve: There was mild regurgitation. - Left atrium: The atrium was normal in size. - Right ventricle: Systolic function was normal. - Right atrium: The atrium was normal in size. - Tricuspid valve: There was mild regurgitation. - Pulmonary arteries: Systolic pressure was within the normal  range. - Inferior vena cava: The vessel was normal in size. - Pericardium, extracardiac: There was no pericardial effusion.  ASSESSMENT AND PLAN:  1.  Atrial tachycardia: Currently on flecainide and diltiazem.  He is feeling much improved with minimal atrial tachycardia.  Unfortunately does have a history of coronary disease and has had a CABG.  We Terry Hansen need to switch him from flecainide to [facility].  2.  Hypertension: Mildly elevated today but is more normal at home.  He feels that he is anxious as he has a upcoming colonoscopy.  No changes.  3.  Coronary artery disease status post CABG: Ejection fraction normal.  No current  chest pain.  No changes.  Current medicines are reviewed at length with the patient today.   The patient does not have concerns regarding his medicines.  The following changes were made today: Stop flecainide, stop diltiazem, start Multitak  Labs/ tests ordered today include:  Orders Placed This Encounter  Procedures  . EKG 12-Lead     Disposition:   FU with Terry Hansen 6 months  Signed, Terry Hansen Meredith Leeds, MD  03/02/2019 9:55 AM     [facility] [address] [place] [address] [phone] (office) [phone] (fax)

## 2019-03-03 ENCOUNTER — Telehealth: Payer: Self-pay | Admitting: Cardiology

## 2019-03-03 NOTE — Telephone Encounter (Signed)
New Message    Patient needs to talk to you about changing prescription.

## 2019-03-04 ENCOUNTER — Telehealth: Payer: Self-pay | Admitting: Gastroenterology

## 2019-03-04 NOTE — Telephone Encounter (Signed)

## 2019-03-05 ENCOUNTER — Encounter: Payer: Self-pay | Admitting: Gastroenterology

## 2019-03-05 ENCOUNTER — Other Ambulatory Visit: Payer: Self-pay

## 2019-03-05 ENCOUNTER — Ambulatory Visit (AMBULATORY_SURGERY_CENTER): Payer: Medicare Other | Admitting: Gastroenterology

## 2019-03-05 VITALS — BP 103/66 | HR 62 | Temp 97.6°F | Resp 18 | Ht 71.0 in | Wt 196.0 lb

## 2019-03-05 DIAGNOSIS — D123 Benign neoplasm of transverse colon: Secondary | ICD-10-CM

## 2019-03-05 DIAGNOSIS — R195 Other fecal abnormalities: Secondary | ICD-10-CM

## 2019-03-05 DIAGNOSIS — K5730 Diverticulosis of large intestine without perforation or abscess without bleeding: Secondary | ICD-10-CM

## 2019-03-05 DIAGNOSIS — K640 First degree hemorrhoids: Secondary | ICD-10-CM | POA: Diagnosis not present

## 2019-03-05 MED ORDER — MULTAQ 400 MG PO TABS: 400.0000 mg | ORAL_TABLET | Freq: Two times a day (BID) | ORAL | 0 refills | Status: DC

## 2019-03-05 MED ORDER — SODIUM CHLORIDE 0.9 % IV SOLN: 500.0000 mL | Freq: Once | INTRAVENOUS | Status: DC

## 2019-03-05 NOTE — Telephone Encounter (Signed)
Pt reports that he stopped the Flecainide but kept taking the Diltiazem. He would like to check the cost of Multaq.  Aware that I sent in Rx for Pericles Carmicheal quote.   Pt advised that I will follow up with him by Wednesday to see if medication affordable.  Also aware I will discuss Diltiazem w/ [name] next week. Pt agreeable to following up by Wednesday

## 2019-03-05 NOTE — Op Note (Signed)
[place] Patient Name: Terry Hansen Procedure Date: 03/05/2019 10:44 AM MRN: [id number] Endoscopist: Ladene Artist , MD Age: 63 Referring MD:  Date of Birth: 26-Mar-1956 Gender: Male Account #: [id number] Procedure:                Colonoscopy Indications:              Positive Cologuard test Medicines:                Monitored Anesthesia Care Procedure:                Pre-Anesthesia Assessment:                           - Prior to the procedure, a History and Physical                            was performed, and patient medications and                            allergies were reviewed. The patient's tolerance of                            previous anesthesia was also reviewed. The risks                            and benefits of the procedure and the sedation                            options and risks were discussed with the patient.                            All questions were answered, and informed consent                            was obtained. Prior Anticoagulants: The patient has                            taken no previous anticoagulant or antiplatelet                            agents. ASA Grade Assessment: II - A patient with                            mild systemic disease. After reviewing the risks                            and benefits, the patient was deemed in                            satisfactory condition to undergo the procedure.                           After obtaining informed consent, the colonoscope  was passed under direct vision. Throughout the                            procedure, the patient's blood pressure, pulse, and                            oxygen saturations were monitored continuously. The                            Colonoscope was introduced through the anus and                            advanced to the the cecum, identified by                            appendiceal orifice and ileocecal valve. The                            ileocecal valve, appendiceal orifice, and rectum                            were photographed. The quality of the bowel                            preparation was good after extensive lavage and                            suctioning. The colonoscopy was performed without                            difficulty. The patient tolerated the procedure                            well. Scope In: 10:45:45 AM Scope Out: 10:58:59 AM Scope Withdrawal Time: 0 hours 12 minutes 7 seconds  Total Procedure Duration: 0 hours 13 minutes 14 seconds  Findings:                 The perianal and digital rectal examinations were                            normal.                           A 7 mm polyp was found in the transverse colon. The                            polyp was sessile. The polyp was removed with a                            cold snare. Resection and retrieval were complete.                           Multiple small-mouthed diverticula were found in  the left colon. There was no evidence of                            diverticular bleeding.                           Internal hemorrhoids were found during                            retroflexion. The hemorrhoids were small and Grade                            I (internal hemorrhoids that do not prolapse).                           The exam was otherwise without abnormality on                            direct and retroflexion views. Complications:            No immediate complications. Estimated blood loss:                            None. Estimated Blood Loss:     Estimated blood loss: none. Impression:               - One 7 mm polyp in the transverse colon, removed                            with a cold snare. Resected and retrieved.                           - Mild diverticulosis in the left colon. There was                            no evidence of diverticular bleeding.                            - Internal hemorrhoids.                           - The examination was otherwise normal on direct                            and retroflexion views. Recommendation:           - Repeat colonoscopy after studies are complete for                            surveillance based on pathology results.                           - Patient has a contact number available for                            emergencies. The signs and symptoms of potential  delayed complications were discussed with the                            patient. Return to normal activities tomorrow.                            Written discharge instructions were provided to the                            patient.                           - High fiber diet.                           - Continue present medications.                           - Await pathology results. Ladene Artist, MD 03/05/2019 11:03:44 AM This report has been signed electronically.

## 2019-03-05 NOTE — Progress Notes (Signed)
Pt's states no medical or surgical changes since previsit or office visit. 

## 2019-03-05 NOTE — Progress Notes (Signed)
Called to room to assist during endoscopic procedure.  Patient ID and intended procedure confirmed with present staff. Received instructions for my participation in the procedure from the performing physician.  

## 2019-03-05 NOTE — Progress Notes (Signed)
PT taken to PACU. Monitors in place. VSS. Report given to RN. 

## 2019-03-05 NOTE — Patient Instructions (Signed)
   Information on polyps,diverticulosis,hemorrhoids,and high fiber diet given to you today  Await pathology results on polyp removed     YOU HAD AN ENDOSCOPIC PROCEDURE TODAY AT [facility]:   Refer to the procedure report that was given to you for any specific questions about what was found during the examination.  If the procedure report does not answer your questions, please call your gastroenterologist to clarify.  If you requested that your care partner not be given the details of your procedure findings, then the procedure report has been included in a sealed envelope for you to review at your convenience later.  YOU SHOULD EXPECT: Some feelings of bloating in the abdomen. Passage of more gas than usual.  Walking can help get rid of the air that was put into your GI tract during the procedure and reduce the bloating. If you had a lower endoscopy (such as a colonoscopy or flexible sigmoidoscopy) you may notice spotting of blood in your stool or on the toilet paper. If you underwent a bowel prep for your procedure, you may not have a normal bowel movement for a few days.  Please Note:  You might notice some irritation and congestion in your nose or some drainage.  This is from the oxygen used during your procedure.  There is no need for concern and it should clear up in a day or so.  SYMPTOMS TO REPORT IMMEDIATELY:   Following lower endoscopy (colonoscopy or flexible sigmoidoscopy):  Excessive amounts of blood in the stool  Significant tenderness or worsening of abdominal pains  Swelling of the abdomen that is new, acute  Fever of 100F or higher    For urgent or emergent issues, a gastroenterologist can be reached at any hour by calling [phone].   DIET:  We do recommend a small meal at first, but then you may proceed to your regular diet.  Drink plenty of fluids but you should avoid alcoholic beverages for 24 hours.  ACTIVITY:  You should plan to take it easy  for the rest of today and you should NOT DRIVE or use heavy machinery until tomorrow (because of the sedation medicines used during the test).    FOLLOW UP: Our staff will call the number listed on your records 48-72 hours following your procedure to check on you and address any questions or concerns that you may have regarding the information given to you following your procedure. If we do not reach you, we will leave a message.  We will attempt to reach you two times.  During this call, we will ask if you have developed any symptoms of COVID 19. If you develop any symptoms (ie: fever, flu-like symptoms, shortness of breath, cough etc.) before then, please call [phone].  If you test positive for Covid 19 in the 2 weeks post procedure, please call and report this information to [place].    If any biopsies were taken you will be contacted by phone or by letter within the next 1-3 weeks.  Please call us at [phone] if you have not heard about the biopsies in 3 weeks.    SIGNATURES/CONFIDENTIALITY: You and/or your care partner have signed paperwork which will be entered into your electronic medical record.  These signatures attest to the fact that that the information above on your After Visit Summary has been reviewed and is understood.  Full responsibility of the confidentiality of this discharge information lies with you and/or your care-partner.

## 2019-03-09 ENCOUNTER — Telehealth: Payer: Self-pay

## 2019-03-09 NOTE — Telephone Encounter (Signed)
  Follow up Call-  Call back number 03/05/2019  Post procedure Call Back phone  # [phone]  Permission to leave phone message Yes  Some recent data might be hidden     Patient questions:  Do you have a fever, pain , or abdominal swelling? No. Pain Score  0 *  Have you tolerated food without any problems? Yes.    Have you been able to return to your normal activities? Yes.    Do you have any questions about your discharge instructions: Diet   No. Medications  No. Follow up visit  No.  Do you have questions or concerns about your Care? No.  Actions: * If pain score is 4 or above: No action needed, pain <4. 1. Have you developed a fever since your procedure? no  2.   Have you had an respiratory symptoms (SOB or cough) since your procedure? no  3.   Have you tested positive for COVID 19 since your procedure no  4.   Have you had any family members/close contacts diagnosed with the COVID 19 since your procedure?  no   If yes to any of these questions please route to Joylene John, RN and Alphonsa Gin, Therapist, sports.

## 2019-03-17 ENCOUNTER — Telehealth: Payer: Self-pay | Admitting: Cardiology

## 2019-03-17 MED ORDER — DILTIAZEM HCL 30 MG PO TABS: 30.0000 mg | ORAL_TABLET | Freq: Four times a day (QID) | ORAL | 3 refills | Status: AC | PRN

## 2019-03-17 NOTE — Telephone Encounter (Signed)
Spoke with pt and pt has stopped Flecainide and Diltiazem approx 3 weeks ago and today had an episode of fast heart rate and elevated B/P 167/99 and 100 Pt was able to take Diltiazem 30 mg and HR decreased as well as B/P Pt does feel weak however.Per pt Multaq is to expensivie Will forward to Dr Curt Bears for review .Adonis Housekeeper

## 2019-03-17 NOTE — Telephone Encounter (Signed)
New Message   Patient is calling because he wants to discuss the changes in his medication. Please call.

## 2019-03-19 NOTE — Telephone Encounter (Signed)
Advised pt Dr. Curt Bears recommends starting Mexiletine 150 mg BID. Pt states he has had only one episode in the last 2 weeks and would like to keep monitoring before considering a medication.  States he will call the office if he begins to have issues w/ elevated HRs/symptoms. Pt aware I will inform Dr. Curt Bears of his wishes.  Pt aware I will call him back if Dr. Curt Bears not agreeable to "monitoring",  Otherwise keep follow up February next year.  Patient verbalized understanding and agreeable to plan.

## 2019-03-29 ENCOUNTER — Encounter: Payer: Self-pay | Admitting: Gastroenterology

## 2019-04-08 ENCOUNTER — Observation Stay (HOSPITAL_COMMUNITY)
Admission: EM | Admit: 2019-04-08 | Discharge: 2019-04-09 | Disposition: A | Discharge status: Home / Self Care | Payer: Medicare Other | Attending: Internal Medicine | Admitting: Internal Medicine

## 2019-04-08 ENCOUNTER — Encounter (HOSPITAL_COMMUNITY): Payer: Self-pay | Admitting: Emergency Medicine

## 2019-04-08 ENCOUNTER — Emergency Department (HOSPITAL_COMMUNITY): Payer: Medicare Other

## 2019-04-08 ENCOUNTER — Other Ambulatory Visit: Payer: Self-pay

## 2019-04-08 DIAGNOSIS — Z7982 Long term (current) use of aspirin: Secondary | ICD-10-CM | POA: Diagnosis not present

## 2019-04-08 DIAGNOSIS — E785 Hyperlipidemia, unspecified: Secondary | ICD-10-CM | POA: Diagnosis not present

## 2019-04-08 DIAGNOSIS — F32A Depression, unspecified: Secondary | ICD-10-CM | POA: Diagnosis present

## 2019-04-08 DIAGNOSIS — Z79899 Other long term (current) drug therapy: Secondary | ICD-10-CM | POA: Diagnosis not present

## 2019-04-08 DIAGNOSIS — Z20828 Contact with and (suspected) exposure to other viral communicable diseases: Secondary | ICD-10-CM | POA: Diagnosis not present

## 2019-04-08 DIAGNOSIS — R0789 Other chest pain: Secondary | ICD-10-CM | POA: Diagnosis not present

## 2019-04-08 DIAGNOSIS — F329 Major depressive disorder, single episode, unspecified: Secondary | ICD-10-CM | POA: Diagnosis not present

## 2019-04-08 DIAGNOSIS — I471 Supraventricular tachycardia: Secondary | ICD-10-CM | POA: Diagnosis present

## 2019-04-08 DIAGNOSIS — F410 Panic disorder [episodic paroxysmal anxiety] without agoraphobia: Secondary | ICD-10-CM | POA: Insufficient documentation

## 2019-04-08 DIAGNOSIS — F419 Anxiety disorder, unspecified: Secondary | ICD-10-CM | POA: Diagnosis not present

## 2019-04-08 DIAGNOSIS — R110 Nausea: Secondary | ICD-10-CM | POA: Diagnosis not present

## 2019-04-08 DIAGNOSIS — Z951 Presence of aortocoronary bypass graft: Secondary | ICD-10-CM | POA: Insufficient documentation

## 2019-04-08 DIAGNOSIS — I2510 Atherosclerotic heart disease of native coronary artery without angina pectoris: Secondary | ICD-10-CM | POA: Insufficient documentation

## 2019-04-08 DIAGNOSIS — I700 Atherosclerosis of aorta: Secondary | ICD-10-CM | POA: Insufficient documentation

## 2019-04-08 DIAGNOSIS — R079 Chest pain, unspecified: Principal | ICD-10-CM | POA: Insufficient documentation

## 2019-04-08 DIAGNOSIS — Z791 Long term (current) use of non-steroidal anti-inflammatories (NSAID): Secondary | ICD-10-CM | POA: Insufficient documentation

## 2019-04-08 DIAGNOSIS — I10 Essential (primary) hypertension: Secondary | ICD-10-CM | POA: Insufficient documentation

## 2019-04-08 LAB — SARS CORONAVIRUS 2 BY RT PCR (HOSPITAL ORDER, PERFORMED IN ~~LOC~~ HOSPITAL LAB): SARS Coronavirus 2: NEGATIVE

## 2019-04-08 LAB — CBC
HCT: 47.9 % (ref 39.0–52.0)
Hemoglobin: 16.8 g/dL (ref 13.0–17.0)
MCH: 31 pg (ref 26.0–34.0)
MCHC: 35.1 g/dL (ref 30.0–36.0)
MCV: 88.4 fL (ref 80.0–100.0)
Platelets: 268 10*3/uL (ref 150–400)
RBC: 5.42 MIL/uL (ref 4.22–5.81)
RDW: 12.2 % (ref 11.5–15.5)
WBC: 6.7 10*3/uL (ref 4.0–10.5)
nRBC: 0 % (ref 0.0–0.2)

## 2019-04-08 LAB — BASIC METABOLIC PANEL
Anion gap: 12 (ref 5–15)
BUN: 12 mg/dL (ref 8–23)
CO2: 24 mmol/L (ref 22–32)
Calcium: 9.2 mg/dL (ref 8.9–10.3)
Chloride: 104 mmol/L (ref 98–111)
Creatinine, Ser: 0.93 mg/dL (ref 0.61–1.24)
GFR calc Af Amer: >60 mL/min (ref >60)
GFR calc non Af Amer: >60 mL/min (ref >60)
Glucose, Bld: 107 mg/dL — ABNORMAL HIGH (ref 70–99)
Potassium: 3.9 mmol/L (ref 3.5–5.1)
Sodium: 140 mmol/L (ref 135–145)

## 2019-04-08 LAB — LIPID PANEL
Cholesterol: 134 mg/dL (ref 0–200)
HDL: 47 mg/dL (ref >40)
LDL Cholesterol: 78 mg/dL (ref 0–99)
Total CHOL/HDL Ratio: 2.9 RATIO
Triglycerides: 45 mg/dL (ref <150)
VLDL: 9 mg/dL (ref 0–40)

## 2019-04-08 LAB — TROPONIN I (HIGH SENSITIVITY)
Troponin I (High Sensitivity): 4 ng/L (ref <18)
Troponin I (High Sensitivity): 5 ng/L (ref <18)

## 2019-04-08 LAB — TSH: TSH: 1.032 u[IU]/mL (ref 0.350–4.500)

## 2019-04-08 LAB — RAPID URINE DRUG SCREEN, HOSP PERFORMED
Amphetamines: NOT DETECTED
Barbiturates: NOT DETECTED
Benzodiazepines: NOT DETECTED
Cocaine: NOT DETECTED
Opiates: NOT DETECTED
Tetrahydrocannabinol: NOT DETECTED

## 2019-04-08 MED ORDER — ATORVASTATIN CALCIUM 40 MG PO TABS
40.0000 mg | ORAL_TABLET | Freq: Every day | ORAL | Status: DC
  Administered 2019-04-08 – 2019-04-09 (×2): 40 mg via ORAL
  Filled 2019-04-08 (×2): qty 1

## 2019-04-08 MED ORDER — ACETAMINOPHEN 325 MG PO TABS: 650.0000 mg | ORAL_TABLET | ORAL | Status: DC | PRN

## 2019-04-08 MED ORDER — ASPIRIN EC 81 MG PO TBEC
81.0000 mg | DELAYED_RELEASE_TABLET | Freq: Every day | ORAL | Status: DC
  Administered 2019-04-09: 09:00:00 81 mg via ORAL
  Filled 2019-04-08: qty 1

## 2019-04-08 MED ORDER — DILTIAZEM HCL ER COATED BEADS 180 MG PO CP24
300.0000 mg | ORAL_CAPSULE | Freq: Every day | ORAL | Status: DC
  Administered 2019-04-09: 09:00:00 300 mg via ORAL
  Filled 2019-04-08: qty 1

## 2019-04-08 MED ORDER — ENOXAPARIN SODIUM 40 MG/0.4ML ~~LOC~~ SOLN
40.0000 mg | SUBCUTANEOUS | Status: DC
  Filled 2019-04-08 (×2): qty 0.4

## 2019-04-08 MED ORDER — LOSARTAN POTASSIUM 50 MG PO TABS
50.0000 mg | ORAL_TABLET | Freq: Every day | ORAL | Status: DC
  Administered 2019-04-08: 50 mg via ORAL
  Filled 2019-04-08: qty 1

## 2019-04-08 MED ORDER — ONDANSETRON HCL 4 MG/2ML IJ SOLN: 4.0000 mg | Freq: Four times a day (QID) | INTRAMUSCULAR | Status: DC | PRN

## 2019-04-08 MED ORDER — DILTIAZEM HCL 30 MG PO TABS: 30.0000 mg | ORAL_TABLET | Freq: Four times a day (QID) | ORAL | Status: DC | PRN

## 2019-04-08 NOTE — ED Triage Notes (Addendum)
Pt reports upper chest pain across to his shoulders that radiates to both arms. Chest pain on and off since this past Monday. Denies SOB endorses weakness, dizziness, and general malaise.

## 2019-04-08 NOTE — ED Notes (Signed)
ED TO INPATIENT HANDOFF REPORT  ED Nurse Name and Phone #:  Elmyra Ricks [phone]  S Name/Age/Gender Terry Hansen 63 y.o. male Room/Bed: 032C/032C  Code Status   Code Status: DNR  Home/SNF/Other Home Patient oriented to: self, place, time and situation Is this baseline? Yes   Triage Complete: Triage complete  Chief Complaint Cp  Triage Note Pt reports upper chest pain across to his shoulders that radiates to both arms. Chest pain on and off since this past Monday. Denies SOB endorses weakness, dizziness, and general malaise.    Allergies No Known Allergies  Level of Care/Admitting Diagnosis ED Disposition    ED Disposition Condition [facility] Area: [place] [[id number]]  Level of Care: Telemetry Cardiac [103]  I expect the patient will be discharged within 24 hours: Yes  LOW acuity---Tx typically complete <24 hrs---ACUTE conditions typically can be evaluated <24 hours---LABS likely to return to acceptable levels <24 hours---IS near functional baseline---EXPECTED to return to current living arrangement---NOT newly hypoxic: Meets criteria for 5C-Observation unit  Covid Evaluation: Asymptomatic Screening Protocol (No Symptoms)  Diagnosis: Chest pain [id number]  Admitting Physician: Karmen Bongo [[id number]]  Attending Physician: Karmen Bongo [[id number]]  PT Class (Do Not Modify): Observation [104]  PT Acc Code (Do Not Modify): Observation [10022]       B Medical/Surgery History Past Medical History:  Diagnosis Date  . CAD (coronary artery disease)    a.  LHC 06/26/12:  pLAD 90%, prox/mid/dist CFS 20-30%, oRCA 100% (non dom), EF 55%;  b. Echo 12/13:  EF 55-60%, Gr 1 diast dysfn;  c. s/p CABG [facility]) 12/13: L-LAD/Dx    . GERD (gastroesophageal reflux disease)   . HLD (hyperlipidemia)   . Hypertension   . Ocular migraine    "maybe once q couple months" (06/02/2014)  . Unstable angina pectoris [facility])    Past Surgical History:  Procedure  Laterality Date  . APPENDECTOMY  1970's  . CARDIAC CATHETERIZATION  06/2012  . CORONARY ARTERY BYPASS GRAFT  06/29/2012   Procedure: CORONARY ARTERY BYPASS GRAFTING (CABG);  Surgeon: Melrose Nakayama, MD;  Location: [facility];  Service: Open Heart Surgery;  Laterality: N/A;  times two using Left Internal Mammary Artery  . INGUINAL HERNIA REPAIR Bilateral [phone]  . LEFT HEART CATHETERIZATION WITH CORONARY ANGIOGRAM N/A 06/26/2012   Procedure: LEFT HEART CATHETERIZATION WITH CORONARY ANGIOGRAM;  Surgeon: Josue Hector, MD;  Location[facility] C[facility] LAB;  Service: Cardiovascular;  Laterality: N/A;  . LEFT HEART CATHETERIZATION WITH CORONARY/GRAFT ANGIOGRAM N/A 06/03/2014   Procedure: LEFT HEART CATHETERIZATION WITH Beatrix Fetters;  Surgeon: Jettie Booze, MD;  Location: [facility] C[facility] LAB;  Service: Cardiovascular;  Laterality: N/A;  . NASAL SEPTUM SURGERY     For uncontrolled epistaxis early 2013  . SIGMOIDOSCOPY  10-15 years ago   in [facility], [place]     A IV Location/Drains/Wounds Patient Lines/Drains/Airways Status   Active Line/Drains/Airways    Name:   Placement date:   Placement time:   Site:   Days:   Peripheral IV 04/08/19 Left Antecubital   04/08/19    0918    Antecubital   less than 1   Incision 06/29/12 Chest Other (Comment)   06/29/12    0923     2474          Intake/Output Last 24 hours  Intake/Output Summary (Last 24 hours) at 04/08/2019 1319 Last data filed at 04/08/2019 1253 Gross per 24 hour  Intake -  Output 750  ml  Net -750 ml    Labs/Imaging Results for orders placed or performed during the hospital encounter of 04/08/19 (from the past 48 hour(s))  Basic metabolic panel     Status: Abnormal   Collection Time: 04/08/19  9:16 AM  Result Value Ref Range   Sodium 140 135 - 145 mmol/L   Potassium 3.9 3.5 - 5.1 mmol/L   Chloride 104 98 - 111 mmol/L   CO2 24 22 - 32 mmol/L   Glucose, Bld 107 (H) 70 - 99 mg/dL   BUN 12 8 - 23 mg/dL   Creatinine, Ser 0.93  0.61 - 1.24 mg/dL   Calcium 9.2 8.9 - 10.3 mg/dL   GFR calc non Af Amer >60 >60 mL/min   GFR calc Af Amer >60 >60 mL/min   Anion gap 12 5 - 15    Comment: Performed at [facility], [place] [address] [place] [address]  CBC     Status: None   Collection Time: 04/08/19  9:16 AM  Result Value Ref Range   WBC 6.7 4.0 - 10.5 K/uL   RBC 5.42 4.22 - 5.81 MIL/uL   Hemoglobin 16.8 13.0 - 17.0 g/dL   HCT 47.9 39.0 - 52.0 %   MCV 88.4 80.0 - 100.0 fL   MCH 31.0 26.0 - 34.0 pg   MCHC 35.1 30.0 - 36.0 g/dL   RDW 12.2 11.5 - 15.5 %   Platelets 268 150 - 400 K/uL   nRBC 0.0 0.0 - 0.2 %    Comment: Performed at [facility], [place] [address], [place], [place] [address]  Troponin I (High Sensitivity)     Status: None   Collection Time: 04/08/19  9:16 AM  Result Value Ref Range   Troponin I (High Sensitivity) 4 <18 ng/L    Comment: (NOTE) Elevated high sensitivity troponin I (hsTnI) values and significant  changes across serial measurements may suggest ACS but many other  chronic and acute conditions are known to elevate hsTnI results.  Refer to the "Links" section for chest pain algorithms and additional  guidance. Performed at [facility] [address], [place], [place] [address]   SARS Coronavirus 2 [facility] order, Performed in [facility] hospital lab) Nasopharyngeal Nasopharyngeal Swab     Status: None   Collection Time: 04/08/19 11:01 AM   Specimen: Nasopharyngeal Swab  Result Value Ref Range   SARS Coronavirus 2 NEGATIVE NEGATIVE    Comment: (NOTE) If result is NEGATIVE SARS-CoV-2 target nucleic acids are NOT DETECTED. The SARS-CoV-2 RNA is generally detectable in upper and lower  respiratory specimens during the acute phase of infection. The lowest  concentration of SARS-CoV-2 viral copies this assay can detect is 250  copies / mL. A negative result does not preclude SARS-CoV-2 infection  and should not be used as the sole basis for treatment or other   patient management decisions.  A negative result may occur with  improper specimen collection / handling, submission of specimen other  than nasopharyngeal swab, presence of viral mutation(s) within the  areas targeted by this assay, and inadequate number of viral copies  (<250 copies / mL). A negative result must be combined with clinical  observations, patient history, and epidemiological information. If result is POSITIVE SARS-CoV-2 target nucleic acids are DETECTED. The SARS-CoV-2 RNA is generally detectable in upper and lower  respiratory specimens dur ing the acute phase of infection.  Positive  results are indicative of active infection with  SARS-CoV-2.  Clinical  correlation with patient history and other diagnostic information is  necessary to determine patient infection status.  Positive results do  not rule out bacterial infection or co-infection with other viruses. If result is PRESUMPTIVE POSTIVE SARS-CoV-2 nucleic acids MAY BE PRESENT.   A presumptive positive result was obtained on the submitted specimen  and confirmed on repeat testing.  While 2019 novel coronavirus  (SARS-CoV-2) nucleic acids may be present in the submitted sample  additional confirmatory testing may be necessary for epidemiological  and / or clinical management purposes  to differentiate between  SARS-CoV-2 and other Sarbecovirus currently known to infect humans.  If clinically indicated additional testing with an alternate test  methodology [phone]) is advised. The SARS-CoV-2 RNA is generally  detectable in upper and lower respiratory sp ecimens during the acute  phase of infection. The expected result is Negative. Fact Sheet for Patients:  StrictlyIdeas.no Fact Sheet for Healthcare Providers: BankingDealers.co.za This test is not yet approved or cleared by the [place] FDA and has been authorized for detection and/or diagnosis of SARS-CoV-2  by FDA under an Emergency Use Authorization (EUA).  This EUA will remain in effect (meaning this test can be used) for the duration of the COVID-19 declaration under Section 564(b)(1) of the Act, 21 U.S.C. section 360bbb-3(b)(1), unless the authorization is terminated or revoked sooner. Performed at [facility], [place] [address], [place], [place] [address]   Troponin I (High Sensitivity)     Status: None   Collection Time: 04/08/19 11:16 AM  Result Value Ref Range   Troponin I (High Sensitivity) 5 <18 ng/L    Comment: (NOTE) Elevated high sensitivity troponin I (hsTnI) values and significant  changes across serial measurements may suggest ACS but many other  chronic and acute conditions are known to elevate hsTnI results.  Refer to the "Links" section for chest pain algorithms and additional  guidance. Performed at [facility], [place] [address] [place], [place] [address]    Dg Chest Port 1 View  Result Date: 04/08/2019 CLINICAL DATA:  Chest pain. EXAM: PORTABLE CHEST 1 VIEW COMPARISON:  November 16, 2017 FINDINGS: Stable postsurgical changes of CABG. Cardiomediastinal silhouette is normal. Mediastinal contours appear intact. Calcific atherosclerotic disease of the aorta. There is no evidence of focal airspace consolidation, pleural effusion or pneumothorax. Low lung volumes. Osseous structures are without acute abnormality. Soft tissues are grossly normal. IMPRESSION: 1. No acute cardiopulmonary disease. 2. Low lung volumes. Electronically Signed   By: Fidela Salisbury M.D.   On: 04/08/2019 10:23    Pending Labs Unresulted Labs (From admission, onward)    Start     Ordered   04/08/19 1208  Lipid panel  Once,   STAT     04/08/19 1208   04/08/19 1208  TSH  Once,   STAT     04/08/19 1208   04/08/19 1208  Hemoglobin A1c  Once,   STAT     04/08/19 1208   04/08/19 1208  Urine rapid drug screen (hosp performed)  ONCE - STAT,   STAT     04/08/19 1208   04/08/19 1207  HIV  antibody (Routine Testing)  Once,   STAT     04/08/19 1208          Vitals/Pain Today's Vitals   04/08/19 1215 04/08/19 1230 04/08/19 1245 04/08/19 1254  BP: 125/81 130/81 (!) 120/91   Pulse: 66 69 67   Resp: 14 15 20    Temp:  TempSrc:      SpO2: 97% 96% 97%   Weight:      Height:      PainSc:    0-No pain    Isolation Precautions No active isolations  Medications Medications  aspirin EC tablet 81 mg (has no administration in time range)  atorvastatin (LIPITOR) tablet 40 mg (has no administration in time range)  diltiazem (CARDIZEM CD) 24 hr capsule 300 mg (has no administration in time range)  losartan (COZAAR) tablet 50 mg (has no administration in time range)  enoxaparin (LOVENOX) injection 40 mg (40 mg Subcutaneous Refused 04/08/19 1245)  acetaminophen (TYLENOL) tablet 650 mg (has no administration in time range)  ondansetron (ZOFRAN) injection 4 mg (has no administration in time range)    Mobility walks Low fall risk   Focused Assessments Cardiac Assessment Handoff:    Lab Results  Component Value Date   CKTOTAL 129 01/31/2014   TROPONINI <0.03 10/20/2015   No results found for: DDIMER Does the Patient currently have chest pain? No     R Recommendations: See Admitting Provider Note  Report given to:   Additional Notes:

## 2019-04-08 NOTE — H&P (Signed)
History and Physical    Terry Hansen [id number] DOB: [birth year] DOA: 04/08/2019  PCP: Terry Peng, NP Consultants:  [name]/Nishan - Cardiology; [facility] Plan - GI Patient coming from:  Home - lives alone; NOKHershal Hansen, [phone]  Chief Complaint: chest pain  HPI: Terry Hansen is a 63 y.o. male with medical history significant of CAD s/p CABG 2013; atrial tachycardia; HTN; and HLD presenting with chest pain.  He reports that he has been having intermittent chest pain since last weekend.  It started over a week ago in his left arm, moved across chest and into neck and back.  It is not constant, but today is radiated into his right arm, lasts a few hours and then resolves for a few hours.  This AM, he was weak, trembling, "just absolutely washed out, I could barely even get up to go to the bathroom this morning."  He did not sleep well, awoke with chills, shaking, chest pain about 0300 and never really went back to sleep, although it subsided a little bit.  Non-exertional episodes.  It resolves spontaneously.  Pain resolved with EMS, although he doesn't feel well generally.  He took NTG last night and it did not help.  No urinary symptoms.  His prior CAD was found after he came in for a migraine, CAD diagnosed "on a fluke."  He reports that he was previously prescribed the "wrong medicine" for his heart and has been off that medication for about a month.  He is concerned that this may have caused long-term harm and may be related to his symptoms.  He also reports h/o significant anxiety with panic disorder and wonders if his symptoms were associated with anxiety.  He does not desire cardiology involvement at this time.   ED Course:  One week of intermittent arm tingling, chest tightness intermittently since Monday.  +fatigue, chills, nausea.  HS troponin 4, repeat pending.  EKG stable.  Cards recommends r/o admission, not planning to see.  Low risk Myoview in 5/19.  Review of Systems: As per  HPI; otherwise review of systems reviewed and negative.   Ambulatory Status:  Ambulates without assistance  Past Medical History:  Diagnosis Date  . CAD (coronary artery disease)    a.  LHC 06/26/12:  pLAD 90%, prox/mid/dist CFS 20-30%, oRCA 100% (non dom), EF 55%;  b. Echo 12/13:  EF 55-60%, Gr 1 diast dysfn;  c. s/p CABG Terry Hansen) 12/13: L-LAD/Dx    . GERD (gastroesophageal reflux disease)   . HLD (hyperlipidemia)   . Hypertension   . Ocular migraine    "maybe once q couple months" (06/02/2014)  . Unstable angina pectoris [facility])     Past Surgical History:  Procedure Laterality Date  . APPENDECTOMY  1970's  . CARDIAC CATHETERIZATION  06/2012  . CORONARY ARTERY BYPASS GRAFT  06/29/2012   Procedure: CORONARY ARTERY BYPASS GRAFTING (CABG);  Surgeon: Terry Nakayama, MD;  Location: [facility];  Service: Open Heart Surgery;  Laterality: N/A;  times two using Left Internal Mammary Artery  . INGUINAL HERNIA REPAIR Bilateral [phone]  . LEFT HEART CATHETERIZATION WITH CORONARY ANGIOGRAM N/A 06/26/2012   Procedure: LEFT HEART CATHETERIZATION WITH CORONARY ANGIOGRAM;  Surgeon: Terry Hector, MD;  Location: [facility] CATH LAB;  Service: Cardiovascular;  Laterality: N/A;  . LEFT HEART CATHETERIZATION WITH CORONARY/GRAFT ANGIOGRAM N/A 06/03/2014   Procedure: LEFT HEART CATHETERIZATION WITH Terry Hansen;  Surgeon: Terry Booze, MD;  Location: [facility] CATH LAB;  Service: Cardiovascular;  Laterality: N/A;  . NASAL  SEPTUM SURGERY     For uncontrolled epistaxis early 2013  . SIGMOIDOSCOPY  10-15 years ago   in [place], [place]    Social History   Socioeconomic History  . Marital status: Divorced    Spouse name: Not on file  . Number of children: Not on file  . Years of education: Not on file  . Highest education level: Not on file  Occupational History  . Occupation: retired  Scientific laboratory technician  . Financial resource strain: Not on file  . Food insecurity    Worry: Not on file     Inability: Not on file  . Transportation needs    Medical: Not on file    Non-medical: Not on file  Tobacco Use  . Smoking status: Never Smoker  . Smokeless tobacco: Never Used  Substance and Sexual Activity  . Alcohol use: Yes    Comment: occ-Social per pt  . Drug use: No  . Sexual activity: Not Currently  Lifestyle  . Physical activity    Days per week: Not on file    Minutes per session: Not on file  . Stress: Not on file  Relationships  . Social Herbalist on phone: Not on file    Gets together: Not on file    Attends religious service: Not on file    Active member of club or organization: Not on file    Attends meetings of clubs or organizations: Not on file    Relationship status: Not on file  . Intimate partner violence    Fear of current or ex partner: Not on file    Emotionally abused: Not on file    Physically abused: Not on file    Forced sexual activity: Not on file  Other Topics Concern  . Not on file  Social History Narrative   Divorced 7 years ago   3 children   Works in Aeronautical engineer    No Known Allergies  Family History  Problem Relation Age of Onset  . Heart disease Brother        CABG age 6, redo bypass ~50  . Heart disease Paternal Grandmother   . Heart disease Cousin        Maternal side  . Heart attack Brother   . Colon cancer Maternal Grandfather 60       METS to colon from Lung CA per pt  . Lung cancer Maternal Grandfather   . Colon polyps Neg Hx   . Esophageal cancer Neg Hx   . Rectal cancer Neg Hx   . Stomach cancer Neg Hx     Prior to Admission medications   Medication Sig Start Date End Date Taking? Authorizing Provider  aspirin EC 81 MG tablet Take 1 tablet (81 mg total) by mouth daily. 07/21/12   Richardson Dopp T, PA-C  atorvastatin (LIPITOR) 40 MG tablet TAKE 1 TABLET BY MOUTH EVERY DAY AT 6 PM 10/15/18   Nafziger, Tommi Rumps, NP  Cetirizine HCl (ZYRTEC PO) Take 10 mg by mouth daily. [facility] Brand    [provider]  Coenzyme Q10 200 MG capsule Take 200 mg by mouth every morning. disontinue while in hospital    [provider]  diltiazem (CARDIZEM) 30 MG tablet Take 1 tablet (30 mg total) by mouth 4 (four) times daily as needed (elevated heart rates). 03/17/19   [name], Ocie Doyne, MD  dronedarone (MULTAQ) 400 MG tablet Take 1 tablet (400 mg total) by mouth 2 (  two) times daily with a meal. 03/05/19   [name], Will Hassell Done, MD  fluticasone (FLONASE) 50 MCG/ACT nasal spray Place 2 sprays into both nostrils daily. 05/26/18   Nafziger, Tommi Rumps, NP  ibuprofen (ADVIL,MOTRIN) 200 MG tablet Take 600 mg by mouth every 6 (six) hours as needed. migraine headache    [provider]  losartan (COZAAR) 50 MG tablet TAKE 1 TABLET(50 MG) BY MOUTH DAILY 08/14/18   Terry Hector, MD  mometasone (NASONEX) 50 MCG/ACT nasal spray Place 2 sprays into the nose daily. 08/28/17   Billie Ruddy, MD  nitroGLYCERIN (NITROSTAT) 0.4 MG SL tablet Place 1 tablet (0.4 mg total) under the tongue every 5 (five) minutes as needed for chest pain. 11/29/13   Terry Hector, MD  omeprazole (PRILOSEC OTC) 20 MG tablet Take 20 mg by mouth daily.    [provider]    Physical Exam: Vitals:   04/08/19 1230 04/08/19 1245 04/08/19 1300 04/08/19 1315  BP: 130/81 (!) 120/91 135/89 137/70  Pulse: 69 67 72 82  Resp: 15 20 16  (!) 21  Temp:      TempSrc:      SpO2: 96% 97% 97% 96%  Weight:      Height:         . General:  Appears calm and comfortable and is NAD . Eyes:  PERRL, EOMI, normal lids, iris . ENT:  grossly normal hearing, lips & tongue, mmm . Neck:  no LAD, masses or thyromegaly . Cardiovascular:  RRR, no m/r/g. No LE edema.  Marland Kitchen Respiratory:   CTA bilaterally with no wheezes/rales/rhonchi.  Normal respiratory effort. . Abdomen:  soft, NT, ND, NABS . Back:   normal alignment, no CVAT . Skin:  no rash or induration seen on limited exam . Musculoskeletal:  grossly normal tone BUE/BLE, good ROM, no  bony abnormality . Psychiatric:  grossly normal mood and affect, speech fluent and appropriate, AOx3 . Neurologic:  CN 2-12 grossly intact, moves all extremities in coordinated fashion, sensation intact    Radiological Exams on Admission: Dg Chest Port 1 View  Result Date: 04/08/2019 CLINICAL DATA:  Chest pain. EXAM: PORTABLE CHEST 1 VIEW COMPARISON:  November 16, 2017 FINDINGS: Stable postsurgical changes of CABG. Cardiomediastinal silhouette is normal. Mediastinal contours appear intact. Calcific atherosclerotic disease of the aorta. There is no evidence of focal airspace consolidation, pleural effusion or pneumothorax. Low lung volumes. Osseous structures are without acute abnormality. Soft tissues are grossly normal. IMPRESSION: 1. No acute cardiopulmonary disease. 2. Low lung volumes. Electronically Signed   By: Fidela Salisbury M.D.   On: 04/08/2019 10:23    EKG: Independently reviewed.  NSR with rate 76; incomplete RBBB; NSCSLT   Labs on Admission: I have personally reviewed the available labs and imaging studies at the time of the admission.  Pertinent labs:   Glucose 107 HS troponin 4, 5 Normal CBC Lipids: 134/47/78/45 TSH 1.032 UDS negative COVID negative   Assessment/Plan Principal Problem:   Chest pain Active Problems:   HTN (hypertension)   Anxiety and depression   PAT (paroxysmal atrial tachycardia) (HCC)   HLD (hyperlipidemia)    Chest pain -Patient with intermittent arm and chest pain for the last week, nonexertional and not relieved with NTG. -1/3 typical symptoms suggestive of noncardiac chest pain.  -CXR unremarkable.   -Initial cardiac HS troponin negative and so is repeat. -EKG not indicative of acute ischemia.   -HEART pathway score is 6, indicating that the patient has an elevated risk score  and requires further evaluation. -Will plan to place in observation status on telemetry to rule out ACS by overnight observation.  -Repeat EKG in AM -Continue  ASA 81 mg daily -Risk factor stratification with HgbA1c and FLP; will also check TSH and UDS -No cardiology consultation for now  PAT -patient with h/o PAT -He was last seen by Dr. Curt Bears on 8/11 and it appears that the patient was referring to having to stop flecainide due to h/o CAD/CABG -It was changed to Multaq, which he could not afford -He has been taking Cardizem CD with prn short-acting diltiazem as well -It is not clear if this issue is related to his current presentation at this time  HTN -Continue long-acting Cardizem and Cozaar  HLD -Continue Lipitor -Lipids are well controlled today  Anxiety and depression -He also thinks that anxiety may be related to his current issue -I offered psychiatric consultation and he does not think this is necessary at this time -Will follow   Note: This patient has been tested and is negative for the novel coronavirus COVID-19.   DVT prophylaxis: Lovenox  Code Status:  DNR - confirmed with patient Family Communication: None present Disposition Plan:  Home once clinically improved Consults called: None for now  Admission status: It is my clinical opinion that referral for OBSERVATION is reasonable and necessary in this patient based on the above information provided. The aforementioned taken together are felt to place the patient at high risk for further clinical deterioration. However it is anticipated that the patient may be medically stable for discharge from the hospital within 24 to 48 hours.      Karmen Bongo MD [facility]   How to contact the [facility] Attending or Consulting provider [place] or covering provider during after hours [place], for this patient?  1. Check the care team in [facility] and look for a) attending/consulting TR[facility] provider listed and b) the [facility] team listed 2. Log into [email or web address] and use [place]'s universal password to access. If you do not have the password, please contact the hospital operator. 3.  Locate the [facility] provider you are looking for under [facility] and page to a number that you can be directly reached. 4. If you still have difficulty reaching the provider, please page the [facility] (Director on Call) for the Hospitalists listed on amion for assistance.   04/08/2019, 2:10 PM

## 2019-04-08 NOTE — Progress Notes (Signed)
Pt received to 3e23 from ED< vss, sr on telemetry, denies cp or sob, call light in reach, edcuated on POC, pt had DNR band on and verbalized he was DNR

## 2019-04-08 NOTE — ED Provider Notes (Signed)
[facility] EMERGENCY DEPARTMENT Provider Note   CSN: [id number] Arrival date & time: 04/08/19  0831     History   Chief Complaint Chief Complaint  Patient presents with  . Chest Pain    HPI Terry Hansen is a 63 y.o. male.with PMHx of HTN, HLD, CAD s/p CABG in 2013 presenting to ED with chest tightness. Patient reports that one week ago, patient started having intermittent left arm tingling that has since radiated to the chest. As of Monday, patient has been experiencing intermittent chest tightness that is across his entire chest and is now radiating to the right arm and upper back. He also endorses fatigue, chills and nausea but denies any fever, cough, shortness of breath, abdominal pain, vomiting, recent travel or recent sick contacts. No known COVID exposure.    Past Medical History:  Diagnosis Date  . CAD (coronary artery disease)    a.  LHC 06/26/12:  pLAD 90%, prox/mid/dist CFS 20-30%, oRCA 100% (non dom), EF 55%;  b. Echo 12/13:  EF 55-60%, Gr 1 diast dysfn;  c. s/p CABG Roxan Hockey) 12/13: L-LAD/Dx    . GERD (gastroesophageal reflux disease)   . HLD (hyperlipidemia)   . Hypertension   . Ocular migraine    "maybe once q couple months" (06/02/2014)  . Unstable angina pectoris [facility])     Patient Active Problem List   Diagnosis Date Noted  . Chest pain 04/08/2019  . Ocular migraine   . HLD (hyperlipidemia)   . Atopic dermatitis 01/27/2017  . PAT (paroxysmal atrial tachycardia) ([place]) 10/22/2015  . Palpitations 10/19/2015  . Chronotropic incompetence with sinus node dysfunction (HCC) 10/19/2015  . Anxiety and depression 10/24/2014  . Unstable angina pectoris ([place]) 06/27/2014  . Chest pain, mid sternal 06/02/2014  . Routine history and physical examination of adult 01/31/2014  . Gastroesophageal reflux disease 01/31/2014  . HTN (hypertension) 03/01/2013  . Eustachian tube dysfunction 02/24/2013  . Labyrinthitis 02/24/2013  . Hypertension, benign  12/26/2012  . Acute bronchitis 12/26/2012  . Allergic rhinitis 12/26/2012  . Impacted cerumen 12/26/2012  . Epistaxis 12/26/2012  . Dizziness 12/26/2012  . Conjunctival hemorrhage 12/26/2012  . Asthma 12/26/2012  . Anxiety state 12/26/2012  . Viral infection 12/26/2012  . Syncope and collapse 12/26/2012  . Pneumonia 12/26/2012  . TIA (transient ischemic attack) 08/12/2012  . Precordial pain 08/12/2012  . Temporary cerebral vascular dysfunction 08/12/2012  . CAD (coronary artery disease) 06/27/2012  . Atherosclerotic heart disease of native coronary artery without angina pectoris 06/27/2012  . Migraine 06/26/2012  . Multiple-type hyperlipidemia 06/26/2012    Past Surgical History:  Procedure Laterality Date  . APPENDECTOMY  1970's  . CARDIAC CATHETERIZATION  06/2012  . CORONARY ARTERY BYPASS GRAFT  06/29/2012   Procedure: CORONARY ARTERY BYPASS GRAFTING (CABG);  Surgeon: Melrose Nakayama, MD;  Location: [facility];  Service: Open Heart Surgery;  Laterality: N/A;  times two using Left Internal Mammary Artery  . INGUINAL HERNIA REPAIR Bilateral [phone]  . LEFT HEART CATHETERIZATION WITH CORONARY ANGIOGRAM N/A 06/26/2012   Procedure: LEFT HEART CATHETERIZATION WITH CORONARY ANGIOGRAM;  Surgeon: Josue Hector, MD;  [facility] CATH LAB;  Service: Cardiovascular;  Laterality: N/A;  . LEFT HEART CATHETERIZATION WITH CORONARY/GRAFT ANGIOGRAM N/A 06/03/2014   Procedure: LEFT HEART CATHETERIZATION WITH Beatrix Fetters;  Surgeon: Jettie Booze, MD;  Location: [facility] CATH LAB;  Service: Cardiovascular;  Laterality: N/A;  . NASAL SEPTUM SURGERY     For uncontrolled epistaxis early 2013  . SIGMOIDOSCOPY  10-15 years ago   in [place], [place]        Home Medications    Prior to Admission medications   Medication Sig Start Date End Date Taking? Authorizing Provider  aspirin EC 81 MG tablet Take 1 tablet (81 mg total) by mouth daily. 07/21/12   Richardson Dopp T, PA-C   atorvastatin (LIPITOR) 40 MG tablet TAKE 1 TABLET BY MOUTH EVERY DAY AT 6 PM 10/15/18   Nafziger, Tommi Rumps, NP  Cetirizine HCl (ZYRTEC PO) Take 10 mg by mouth daily. [facility] Brand    [provider]  Coenzyme Q10 200 MG capsule Take 200 mg by mouth every morning. disontinue while in hospital    [provider]  diltiazem (CARDIZEM) 30 MG tablet Take 1 tablet (30 mg total) by mouth 4 (four) times daily as needed (elevated heart rates). 03/17/19   [name], Ocie Doyne, MD  dronedarone (MULTAQ) 400 MG tablet Take 1 tablet (400 mg total) by mouth 2 (two) times daily with a meal. 03/05/19   [name], Will Hassell Done, MD  fluticasone (FLONASE) 50 MCG/ACT nasal spray Place 2 sprays into both nostrils daily. 05/26/18   Nafziger, Tommi Rumps, NP  ibuprofen (ADVIL,MOTRIN) 200 MG tablet Take 600 mg by mouth every 6 (six) hours as needed. migraine headache    [provider]  losartan (COZAAR) 50 MG tablet TAKE 1 TABLET(50 MG) BY MOUTH DAILY 08/14/18   Josue Hector, MD  mometasone (NASONEX) 50 MCG/ACT nasal spray Place 2 sprays into the nose daily. 08/28/17   Billie Ruddy, MD  nitroGLYCERIN (NITROSTAT) 0.4 MG SL tablet Place 1 tablet (0.4 mg total) under the tongue every 5 (five) minutes as needed for chest pain. 11/29/13   Josue Hector, MD  omeprazole (PRILOSEC OTC) 20 MG tablet Take 20 mg by mouth daily.    [provider]    Family History Family History  Problem Relation Age of Onset  . Heart disease Brother        CABG age 31, redo bypass ~50  . Heart disease Paternal Grandmother   . Heart disease Cousin        Maternal side  . Heart attack Brother   . Colon cancer Maternal Grandfather 60       METS to colon from Lung CA per pt  . Lung cancer Maternal Grandfather   . Colon polyps Neg Hx   . Esophageal cancer Neg Hx   . Rectal cancer Neg Hx   . Stomach cancer Neg Hx     Social History Social History   Tobacco Use  . Smoking status: Never Smoker  . Smokeless  tobacco: Never Used  Substance Use Topics  . Alcohol use: Yes    Comment: occ-Social per pt  . Drug use: No     Allergies   Patient has no known allergies.   Review of Systems Review of Systems  Constitutional: Positive for chills and fatigue. Negative for activity change, appetite change, diaphoresis and fever.  HENT: Negative for congestion, postnasal drip, sore throat and voice change.   Eyes: Negative for photophobia and visual disturbance.  Respiratory: Positive for chest tightness. Negative for apnea, cough, shortness of breath and wheezing.   Cardiovascular: Positive for chest pain. Negative for palpitations.  Gastrointestinal: Positive for nausea. Negative for abdominal distention, abdominal pain, blood in stool, constipation, diarrhea and vomiting.  Endocrine: Negative.   Genitourinary: Negative.   Musculoskeletal: Positive for back pain. Negative for arthralgias, myalgias, neck pain and neck stiffness.  Skin: Negative.   Neurological: Negative for dizziness, weakness, light-headedness, numbness and headaches.  Hematological: Negative.   Psychiatric/Behavioral: Negative.      Physical Exam Updated Vital Signs BP (!) 143/91   Pulse 71   Temp 97.9 F (36.6 C) (Oral)   Resp 14   Ht 5\' 11"  (1.803 m)   Wt 88.5 kg   SpO2 97%   BMI 27.20 kg/m   Physical Exam Vitals signs reviewed.  Constitutional:      General: He is not in acute distress.    Appearance: He is well-developed. He is not diaphoretic.  HENT:     Head: Normocephalic and atraumatic.  Neck:     Musculoskeletal: Normal range of motion and neck supple.  Cardiovascular:     Rate and Rhythm: Normal rate and regular rhythm.     Heart sounds: Normal heart sounds. Heart sounds not distant. No murmur. No friction rub. No gallop. No S3 sounds.   Pulmonary:     Effort: Pulmonary effort is normal. No tachypnea, accessory muscle usage or respiratory distress.     Breath sounds: Normal breath sounds. No  stridor.  Chest:     Chest wall: No tenderness.  Abdominal:     General: Bowel sounds are normal. There is no abdominal bruit.     Palpations: Abdomen is soft.     Tenderness: There is no abdominal tenderness. There is no guarding.  Musculoskeletal: Normal range of motion.  Skin:    General: Skin is warm and dry.     Capillary Refill: Capillary refill takes less than 2 seconds.  Neurological:     General: No focal deficit present.     Mental Status: He is alert and oriented to person, place, and time.     Cranial Nerves: No cranial nerve deficit.     Motor: No weakness.    ED Treatments / Results  Labs (all labs ordered are listed, but only abnormal results are displayed) Labs Reviewed  BASIC METABOLIC PANEL - Abnormal; Notable for the following components:      Result Value   Glucose, Bld 107 (*)    All other components within normal limits  SARS CORONAVIRUS 2 (HOSPITAL ORDER, [facility])  CBC  TROPONIN I (HIGH SENSITIVITY)  TROPONIN I (HIGH SENSITIVITY)    EKG EKG Interpretation  Date/Time:  Thursday April 08 2019 08:42:41 EDT Ventricular Rate:  76 PR Interval:    QRS Duration: 114 QT Interval:  397 QTC Calculation: 447 R Axis:   -8 Text Interpretation:  Sinus rhythm Incomplete right bundle branch block Baseline wander in lead(s) V1 Similar ECG to prior, ST abnormality still present however less significant than previous Confirmed by Gareth Morgan [phone]) on 04/08/2019 9:13:07 AM   Radiology Dg Chest Port 1 View  Result Date: 04/08/2019 CLINICAL DATA:  Chest pain. EXAM: PORTABLE CHEST 1 VIEW COMPARISON:  November 16, 2017 FINDINGS: Stable postsurgical changes of CABG. Cardiomediastinal silhouette is normal. Mediastinal contours appear intact. Calcific atherosclerotic disease of the aorta. There is no evidence of focal airspace consolidation, pleural effusion or pneumothorax. Low lung volumes. Osseous structures are without acute  abnormality. Soft tissues are grossly normal. IMPRESSION: 1. No acute cardiopulmonary disease. 2. Low lung volumes. Electronically Signed   By: Fidela Salisbury M.D.   On: 04/08/2019 10:23    Procedures Procedures (including critical care time)  Medications Ordered in ED Medications - No data to display   Initial Impression / Assessment and Plan / ED  Course  I have reviewed the triage vital signs and the nursing notes.  Pertinent labs & imaging results that were available during my care of the patient were reviewed by me and considered in my medical decision making (see chart for details).   Patient is a 63yr old male with PMHx of HTN, HLD, CAD s/p CABG in 2013 presenting to ED with intermittent chest tightness since Monday. Patient reports that one week ago, patient started having intermittent left arm tingling that has since radiated to the chest. No alleviating or aggravating factors. As of last night, patient reports that the chest tightness has spread to his right shoulder and back. Patient also endorses one week history of fatigue, chills and nausea during these episodes. However, no fever, cough, dsypnea, abdominal pain, vomiting, diarrhea or recent sick contacts or travel.  On examination, patient is hemodynamically stable and in no acute distress. He is alert and oriented and able to provide coherent history. BP equal in both arms. No abnormalities noted on cardiopulmonary examination. Distal pulses intact. No tenderness to palpation of chest wall. However does have some tenderness to palpation along the paraspinal thoracic muscles. No acute changes on EKG. No abnormalities seen on CXR. Initial high sensitivity troponin 4.  Cardiology recommendations for admission to hospitalist service to r/o chest pain. Patient to be admitted to hospitalist service with Dr. Lorin Mercy.     Final Clinical Impressions(s) / ED Diagnoses   Final diagnoses:  None    ED Discharge Orders    None        Harvie Heck, MD 04/08/19 1158    Gareth Morgan, MD 04/08/19 2120

## 2019-04-09 ENCOUNTER — Other Ambulatory Visit: Payer: Self-pay | Admitting: Cardiology

## 2019-04-09 ENCOUNTER — Ambulatory Visit (HOSPITAL_BASED_OUTPATIENT_CLINIC_OR_DEPARTMENT_OTHER): Payer: Medicare Other

## 2019-04-09 DIAGNOSIS — E785 Hyperlipidemia, unspecified: Secondary | ICD-10-CM

## 2019-04-09 DIAGNOSIS — F419 Anxiety disorder, unspecified: Secondary | ICD-10-CM | POA: Diagnosis not present

## 2019-04-09 DIAGNOSIS — I361 Nonrheumatic tricuspid (valve) insufficiency: Secondary | ICD-10-CM | POA: Diagnosis not present

## 2019-04-09 DIAGNOSIS — I471 Supraventricular tachycardia: Secondary | ICD-10-CM | POA: Diagnosis not present

## 2019-04-09 DIAGNOSIS — F329 Major depressive disorder, single episode, unspecified: Secondary | ICD-10-CM | POA: Diagnosis not present

## 2019-04-09 DIAGNOSIS — I351 Nonrheumatic aortic (valve) insufficiency: Secondary | ICD-10-CM

## 2019-04-09 DIAGNOSIS — Z951 Presence of aortocoronary bypass graft: Secondary | ICD-10-CM | POA: Diagnosis not present

## 2019-04-09 DIAGNOSIS — Z79899 Other long term (current) drug therapy: Secondary | ICD-10-CM | POA: Diagnosis not present

## 2019-04-09 DIAGNOSIS — Z791 Long term (current) use of non-steroidal anti-inflammatories (NSAID): Secondary | ICD-10-CM | POA: Diagnosis not present

## 2019-04-09 DIAGNOSIS — F410 Panic disorder [episodic paroxysmal anxiety] without agoraphobia: Secondary | ICD-10-CM | POA: Diagnosis not present

## 2019-04-09 DIAGNOSIS — R079 Chest pain, unspecified: Secondary | ICD-10-CM

## 2019-04-09 DIAGNOSIS — Z20828 Contact with and (suspected) exposure to other viral communicable diseases: Secondary | ICD-10-CM | POA: Diagnosis not present

## 2019-04-09 DIAGNOSIS — I10 Essential (primary) hypertension: Secondary | ICD-10-CM | POA: Diagnosis not present

## 2019-04-09 DIAGNOSIS — I2510 Atherosclerotic heart disease of native coronary artery without angina pectoris: Secondary | ICD-10-CM | POA: Diagnosis not present

## 2019-04-09 DIAGNOSIS — Z7982 Long term (current) use of aspirin: Secondary | ICD-10-CM | POA: Diagnosis not present

## 2019-04-09 LAB — HEMOGLOBIN A1C
Hgb A1c MFr Bld: 5.4 % (ref 4.8–5.6)
Mean Plasma Glucose: 108 mg/dL

## 2019-04-09 LAB — HIV ANTIBODY (ROUTINE TESTING W REFLEX): HIV Screen 4th Generation wRfx: NONREACTIVE

## 2019-04-09 LAB — ECHOCARDIOGRAM COMPLETE
Height: 71 in
Weight: 3094.4 oz

## 2019-04-09 NOTE — Discharge Summary (Signed)
Discharge Summary  Terry Hansen [id number] DOB: May 06, 1956  PCP: [name], NP  Admit date: 04/08/2019 Discharge date: 04/09/2019  Time spent: 30 mins  Recommendations for Outpatient Follow-up:  1. PCP in 1 week 2. Follow-up with cardiology as scheduled  Discharge Diagnoses:  Active Hospital Problems   Diagnosis Date Noted  . Chest pain 04/08/2019  . HLD (hyperlipidemia)   . PAT (paroxysmal atrial tachycardia) ([place]) 10/22/2015  . Anxiety and depression 10/24/2014  . HTN (hypertension) 03/01/2013    Resolved Hospital Problems  No resolved problems to display.    Discharge Condition: Stable  Diet recommendation: Heart healthy  Vitals:   04/09/19 0818 04/09/19 1258  BP: (!) 139/93 130/79  Pulse: 71 79  Resp: 15 17  Temp: 97.9 F (36.6 C) 97.9 F (36.6 C)  SpO2: 98% 98%    History of present illness:  Terry Hansen is a 63 y.o. male with medical history significant of CAD s/p CABG 2013; atrial tachycardia; HTN; and HLD presenting with chest pain.  He reports that he has been having intermittent chest pain since last weekend.  It started over a week ago in his left arm, moved across chest and into neck and back.  It is not constant, but today is radiated into his right arm, lasts a few hours and then resolves for a few hours. Non-exertional episodes.  It resolves spontaneously. He took NTG last night and it did not help.  No urinary symptoms. His prior CAD was found after he came in for a migraine, CAD diagnosed "on a fluke." Pt also reports h/o significant anxiety with panic disorder and wonders if his symptoms were associated with anxiety.  In the ED, troponins negative, EKG no acute ST changes.  Patient admitted for further management.    Today, patient denies any further chest pain episodes, reported chest pain felt different from his usual panic attacks earlier, but later stated that it is like his regular panic attacks.  Advised patient to seek outpatient  mental health for his panic attacks, but patient declined, stating his brother is a Social worker and usually does help him with his attacks.  Patient advised to seek care for his panic attacks with his primary care, patient stated he would consider that.  Denied any shortness of breath, diaphoresis, dizziness, fever/chills, nausea/vomiting, abdominal pain.  Patient stable to be discharged from a cardiology standpoint, with close follow-up  Hospital Course:  Principal Problem:   Chest pain Active Problems:   HTN (hypertension)   Anxiety and depression   PAT (paroxysmal atrial tachycardia) ([facility])   HLD (hyperlipidemia)  Chest pain/history of CABG in 2013 Possibly atypical/non-cardiac, no further episodes Troponins negative, EKG with no acute ST changes Chest x-ray unremarkable HEART pathway score is 6, indicating that the patient has an elevated risk score and requires further evaluation Last cardiac cath in 2015 showed patent grafts A1c 5.4, LDL 78 Echo with normal EF, otherwise unremarkable Cardiology consulted, plan for outpatient Lexiscan Myoview and close follow-up Continue ASA 81 mg daily, statins  Atrial tachycardia Patient was last seen by Dr. Curt Bears on 8/11 and it appears that the patient was referring to having to stop flecainide due to h/o CAD/CABG Continue Cardizem CD with prn short-acting diltiazem as well  HTN Continue long-acting Cardizem and Cozaar  HLD Continue Lipitor  Anxiety and depression Pt also thinks that anxiety may be related to his current issue Patient declined any mental health consultation Advised to follow-up with his PCP  Malnutrition Type:      Malnutrition Characteristics:      Nutrition Interventions:      Estimated body mass index is 26.97 kg/m as calculated from the following:   Height as of this encounter: 5\' 11"  (1.803 m).   Weight as of this encounter: 87.7 kg.    Procedures:  None  Consultations:   Cardiology  Discharge Exam: BP 130/79 (BP Location: Right Arm)   Pulse 79   Temp 97.9 F (36.6 C) (Oral)   Resp 17   Ht 5\' 11"  (1.803 m)   Wt 87.7 kg   SpO2 98%   BMI 26.97 kg/m   General: NAD Cardiovascular: S1, S2 present Respiratory: CTA B  Discharge Instructions You were cared for by a hospitalist during your hospital stay. If you have any questions about your discharge medications or the care you received while you were in the hospital after you are discharged, you can call the unit and asked to speak with the hospitalist on call if the hospitalist that took care of you is not available. Once you are discharged, your primary care physician will handle any further medical issues. Please note that NO REFILLS for any discharge medications will be authorized once you are discharged, as it is imperative that you return to your primary care physician (or establish a relationship with a primary care physician if you do not have one) for your aftercare needs so that they can reassess your need for medications and monitor your lab values.   Allergies as of 04/09/2019   No Known Allergies     Medication List    TAKE these medications   aspirin EC 81 MG tablet Take 1 tablet (81 mg total) by mouth daily.   atorvastatin 40 MG tablet Commonly known as: LIPITOR TAKE 1 TABLET BY MOUTH EVERY DAY AT 6 PM What changed: See the new instructions.   Coenzyme Q10 200 MG capsule Take 200 mg by mouth every morning. disontinue while in hospital   diltiazem 30 MG tablet Commonly known as: CARDIZEM Take 1 tablet (30 mg total) by mouth 4 (four) times daily as needed (elevated heart rates).   diltiazem 300 MG 24 hr capsule Commonly known as: CARDIZEM CD Take 300 mg by mouth daily.   fluticasone 50 MCG/ACT nasal spray Commonly known as: FLONASE Place 2 sprays into both nostrils daily. What changed:   when to take this  reasons to take this   ibuprofen 200 MG tablet Commonly known as:  ADVIL Take 600 mg by mouth every 6 (six) hours as needed. migraine headache   losartan 50 MG tablet Commonly known as: COZAAR TAKE 1 TABLET(50 MG) BY MOUTH DAILY What changed: See the new instructions.   nitroGLYCERIN 0.4 MG SL tablet Commonly known as: NITROSTAT Place 1 tablet (0.4 mg total) under the tongue every 5 (five) minutes as needed for chest pain.   omeprazole 20 MG tablet Commonly known as: PRILOSEC OTC Take 20 mg by mouth daily as needed (acid reflux).   ZYRTEC PO Take 10 mg by mouth daily as needed (allergies). [facility] Brand      No Known Allergies Follow-up Information    Josue Hector, MD Follow up.   Specialty: Cardiology Why: You will be called on Monday to arrange for office follow up.  Contact information: [id number] [place] [address] [phone]        [facility] Follow up.   Specialty: Cardiology Why: You  will be called to arrange for a stress test. See attached instructions for the test.  Contact information: [address], [address] [place] [phone]       [name], NP. Schedule an appointment as soon as possible for a visit in 1 week(s).   Specialty: Family Medicine Contact information: [place] [address] [phone]        Constance Haw, MD .   Specialty: Cardiology Contact information: [place] [address] [phone]            The results of significant diagnostics from this hospitalization (including imaging, microbiology, ancillary and laboratory) are listed below for reference.    Significant Diagnostic Studies: Dg Chest Port 1 View  Result Date: 04/08/2019 CLINICAL DATA:  Chest pain. EXAM: PORTABLE CHEST 1 VIEW COMPARISON:  November 16, 2017 FINDINGS: Stable postsurgical changes of CABG. Cardiomediastinal silhouette is normal. Mediastinal contours appear intact.  Calcific atherosclerotic disease of the aorta. There is no evidence of focal airspace consolidation, pleural effusion or pneumothorax. Low lung volumes. Osseous structures are without acute abnormality. Soft tissues are grossly normal. IMPRESSION: 1. No acute cardiopulmonary disease. 2. Low lung volumes. Electronically Signed   By: Fidela Salisbury M.D.   On: 04/08/2019 10:23    Microbiology: Recent Results (from the past 240 hour(s))  SARS Coronavirus 2 [facility] order, Performed in [facility] hospital lab) Nasopharyngeal Nasopharyngeal Swab     Status: None   Collection Time: 04/08/19 11:01 AM   Specimen: Nasopharyngeal Swab  Result Value Ref Range Status   SARS Coronavirus 2 NEGATIVE NEGATIVE Final    Comment: (NOTE) If result is NEGATIVE SARS-CoV-2 target nucleic acids are NOT DETECTED. The SARS-CoV-2 RNA is generally detectable in upper and lower  respiratory specimens during the acute phase of infection. The lowest  concentration of SARS-CoV-2 viral copies this assay can detect is 250  copies / mL. A negative result does not preclude SARS-CoV-2 infection  and should not be used as the sole basis for treatment or other  patient management decisions.  A negative result may occur with  improper specimen collection / handling, submission of specimen other  than nasopharyngeal swab, presence of viral mutation(s) within the  areas targeted by this assay, and inadequate number of viral copies  (<250 copies / mL). A negative result must be combined with clinical  observations, patient history, and epidemiological information. If result is POSITIVE SARS-CoV-2 target nucleic acids are DETECTED. The SARS-CoV-2 RNA is generally detectable in upper and lower  respiratory specimens dur ing the acute phase of infection.  Positive  results are indicative of active infection with SARS-CoV-2.  Clinical  correlation with patient history and other diagnostic information is  necessary to  determine patient infection status.  Positive results do  not rule out bacterial infection or co-infection with other viruses. If result is PRESUMPTIVE POSTIVE SARS-CoV-2 nucleic acids MAY BE PRESENT.   A presumptive positive result was obtained on the submitted specimen  and confirmed on repeat testing.  While 2019 novel coronavirus  (SARS-CoV-2) nucleic acids may be present in the submitted sample  additional confirmatory testing may be necessary for epidemiological  and / or clinical management purposes  to differentiate between  SARS-CoV-2 and other Sarbecovirus currently known to infect humans.  If clinically indicated additional testing with an alternate test  methodology [phone]) is advised. The SARS-CoV-2 RNA is generally  detectable in upper and lower respiratory sp  ecimens during the acute  phase of infection. The expected result is Negative. Fact Sheet for Patients:  StrictlyIdeas.no Fact Sheet for Healthcare Providers: BankingDealers.co.za This test is not yet approved or cleared by the [place] FDA and has been authorized for detection and/or diagnosis of SARS-CoV-2 by FDA under an Emergency Use Authorization (EUA).  This EUA will remain in effect (meaning this test can be used) for the duration of the COVID-19 declaration under Section 564(b)(1) of the Act, 21 U.S.C. section 360bbb-3(b)(1), unless the authorization is terminated or revoked sooner. Performed at [facility], [place] [address], [place], [place] [address]      Labs: Basic Metabolic Panel: Recent Labs  Lab 04/08/19 0916  NA 140  K 3.9  CL 104  CO2 24  GLUCOSE 107*  BUN 12  CREATININE 0.93  CALCIUM 9.2   Liver Function Tests: No results for input(s): AST, ALT, ALKPHOS, BILITOT, PROT, ALBUMIN in the last 168 hours. No results for input(s): LIPASE, AMYLASE in the last 168 hours. No results for input(s): AMMONIA in the last 168 hours. CBC:  Recent Labs  Lab 04/08/19 0916  WBC 6.7  HGB 16.8  HCT 47.9  MCV 88.4  PLT 268   Cardiac Enzymes: No results for input(s): CKTOTAL, CKMB, CKMBINDEX, TROPONINI in the last 168 hours. BNP: BNP (last 3 results) No results for input(s): BNP in the last 8760 hours.  ProBNP (last 3 results) No results for input(s): PROBNP in the last 8760 hours.  CBG: No results for input(s): GLUCAP in the last 168 hours.     Signed:  Alma Friendly, MD [facility] 04/09/2019, 5:32 PM

## 2019-04-09 NOTE — Discharge Instructions (Signed)
° °  You have a Stress Test scheduled at [facility]. Your doctor has ordered this test to check the blood flow in your heart arteries. ° °Please arrive 15 minutes early for paperwork. The whole test will take several hours. You may want to bring reading material to remain occupied while undergoing different parts of the test. ° °Instructions: °· No food/drink after midnight the night before. °· It is OK to take your morning meds with a sip of water EXCEPT for those types of medicines listed below or otherwise instructed. °· No caffeine/decaf products 24 hours before, including medicines such as Excedrin or Goody Powders. Call if there are any questions.  °· Wear comfortable clothes and shoes.  ° °Special Medication Instructions: °· Beta blockers such as metoprolol (Lopressor/Toprol XL), atenolol (Tenormin), carvedilol (Coreg), nebivolol (Bystolic), bisoprolol (Zebeta), propranolol (Inderal) should not be taken for 24 hours before the test. °· Calcium channel blockers such as diltiazem (Cardizem) or verapmil (Calan) should not be taken for 24 hours before the test. °· Remove nitroglycerin patches and do not take nitrate preparations such as Imdur/isosorbide the day of your test. °· No Persantine/Theophylline or Aggrenox medicines should be used within 24 hours of the test.  °· If you are diabetic, please ask which medications to hold the day of the test ° °What To Expect: °When you arrive in the lab, the technician will inject a small amount of radioactive tracer into your arm through an IV while you are resting quietly. This helps us to form pictures of your heart. You will likely only feel a sting from the IV. After a waiting period, resting pictures will be obtained under a big camera. These are the "before" pictures. ° °Next, you will be prepped for the stress portion of the test. This may include either walking on a treadmill or receiving a medicine that helps to dilate blood vessels in  your heart to simulate the effect of exercise on your heart. If you are walking on a treadmill, you will walk at different paces to try to get your heart rate to a goal number that is based on your age. If your doctor has chosen the pharmacologic test, then you will receive a medicine through your IV that may cause temporary nausea, flushing, shortness of breath and sometimes chest discomfort or vomiting. This is typically short-lived and usually resolves quickly. If you experience symptoms, that does not automatically mean the test is abnormal. Some patients do not experience any symptoms at all. Your blood pressure and heart rate will be monitored, and we will be watching your EKG on a computer screen for any changes. During this portion of the test, the radiologist will inject another small amount of radioactive tracer into your IV. After a waiting period, you will undergo a second set of pictures. These are the "after" pictures. ° °The doctor reading the test will compare the before-and-after images to look for evidence of heart blockages or heart weakness. The test usually takes 1 day to complete, but in certain instances (for example, if a patient is over a certain weight limit), the test may be done over the span of 2 days. ° ° °

## 2019-04-09 NOTE — Progress Notes (Signed)
  Echocardiogram 2D Echocardiogram has been performed.  Johny Chess 04/09/2019, 12:01 PM

## 2019-04-09 NOTE — Care Management Obs Status (Signed)
[facility] NOTIFICATION   Patient Details  Name: Terry Hansen MRN: [id number] Date of Birth: 07-Jan-1956   Medicare Observation Status Notification Given:  Yes    Zenon Mayo, RN 04/09/2019, 1:40 PM

## 2019-04-09 NOTE — Consult Note (Signed)
Cardiology Consultation:   Patient ID: Terry Hansen MRN: [id number]; DOB: [birth year]  Admit date: 04/08/2019 Date of Consult: 04/09/2019  Primary Care Provider: [name], NP Primary Cardiologist: Jenkins Rouge, MD  Primary Electrophysiologist:  Constance Haw, MD    Patient Profile:   Terry Hansen is a 63 y.o. male with a hx of CAD s/p CABG 2013, patent grafts in 2015, hypertension, hyperlipidemia and PAT who is being seen today for the evaluation of chest pain at the request of Dr. Horris Latino.  History of Present Illness:   Terry Hansen has a history of CABG in 2013 with LIMA to LAD/diagonal.  Last cardiac catheterization 06/03/2014 showed patent grafts.  Chest pain at that time was considered noncardiac.  The patient also has a history of atrial tachycardia, followed by Dr. Curt Bears, on Cardizem.  He previously had fatigue on beta-blockers.  Patient had nuclear stress tests in 10/2015 and 11/2017, both showing no ischemia. Last echocardiogram 09/2015 showed normal LVEF 60-65%, no regional wall motion abnormalities, mild aortic regurgitation, mild aortic stenosis, mild mitral regurgitation.  The patient presented to the hospital yesterday morning for evaluation of chest pain via EMS.  He says that he began to note tingling of his left arm about 1- 1.5 weeks ago mostly when he moved his arm a certain way, sometimes at rest.  Over the weekend he had mild central chest pressure (3/10) that radiated to his neck and back, between his shoulder blades.  This has been coming and going, lasting for 1 to 2 hours at a time and up to 3-4 hours.  Yesterday morning he awoke with his chest aching (4-5/10), going to his right arm.  He felt weak when he got up and was shaking like chills, but does not think he had a fever.  He had no shortness of breath at all or other symptoms like nausea.  He did have some brief lightheadedness.  He says that the symptoms are actually quite similar to prior panic  attacks.  The symptoms were also similar to what led him to have a stress test last year in 11/2017.  Terry Hansen notes that he has had trouble with anxiety issues since his CABG surgery.  He now thinks that his recent symptoms may be related to his panic attacks.  He says that he normally does yard work and is fairly active without any exertional chest discomfort.  He denies any recent orthopnea, PND or edema.  The patient was last seen in the office on 03/02/2019 by Dr. Curt Bears, regarding his atrial tachycardia.  Dr. Curt Bears discontinued his flecainide due to his history of CAD.  Notes also indicate that diltiazem was to be discontinued, however the patient did not get that message and he has continued his diltiazem.  Dr. Curt Bears wanted to try the patient on Multitak but this was too expensive.  There was also question of trying mexiletine but according to the patient, at his last conversation by phone with the office it was decided to continue on diltiazem with no addition of other medications.  The patient denies any recent episodes of fast heartbeats.  Heart Pathway Score:     Past Medical History:  Diagnosis Date  . CAD (coronary artery disease)    a.  LHC 06/26/12:  pLAD 90%, prox/mid/dist CFS 20-30%, oRCA 100% (non dom), EF 55%;  b. Echo 12/13:  EF 55-60%, Gr 1 diast dysfn;  c. s/p CABG [facility]) 12/13: L-LAD/Dx    . GERD (gastroesophageal reflux disease)   .  HLD (hyperlipidemia)   . Hypertension   . Ocular migraine    "maybe once q couple months" (06/02/2014)  . Unstable angina pectoris [facility])     Past Surgical History:  Procedure Laterality Date  . APPENDECTOMY  1970's  . CARDIAC CATHETERIZATION  06/2012  . CORONARY ARTERY BYPASS GRAFT  06/29/2012   Procedure: CORONARY ARTERY BYPASS GRAFTING (CABG);  Surgeon: Melrose Nakayama, MD;  Location: [place];  Service: Open Heart Surgery;  Laterality: N/A;  times two using Left Internal Mammary Artery  . INGUINAL HERNIA REPAIR Bilateral  [phone]  . LEFT HEART CATHETERIZATION WITH CORONARY ANGIOGRAM N/A 06/26/2012   Procedure: LEFT HEART CATHETERIZATION WITH CORONARY ANGIOGRAM;  Surgeon: Josue Hector, MD;  Location: [facility] CATH LAB;  Service: Cardiovascular;  Laterality: N/A;  . LEFT HEART CATHETERIZATION WITH CORONARY/GRAFT ANGIOGRAM N/A 06/03/2014   Procedure: LEFT HEART CATHETERIZATION WITH Beatrix Fetters;  Surgeon: Jettie Booze, MD;  Location: [facility] CATH LAB;  Service: Cardiovascular;  Laterality: N/A;  . NASAL SEPTUM SURGERY     For uncontrolled epistaxis early 2013  . SIGMOIDOSCOPY  10-15 years ago   in [place], [place]     Home Medications:  Prior to Admission medications   Medication Sig Start Date End Date Taking? Authorizing Provider  aspirin EC 81 MG tablet Take 1 tablet (81 mg total) by mouth daily. 07/21/12  Yes Weaver, Scott T, PA-C  atorvastatin (LIPITOR) 40 MG tablet TAKE 1 TABLET BY MOUTH EVERY DAY AT 6 PM Patient taking differently: Take 40 mg by mouth daily at 6 PM.  10/15/18  Yes Nafziger, Tommi Rumps, NP  Cetirizine HCl (ZYRTEC PO) Take 10 mg by mouth daily as needed (allergies). [facility] Brand    Yes [provider]  Coenzyme Q10 200 MG capsule Take 200 mg by mouth every morning. disontinue while in hospital   Yes [provider]  diltiazem (CARDIZEM CD) 300 MG 24 hr capsule Take 300 mg by mouth daily. 03/16/19  Yes [provider]  diltiazem (CARDIZEM) 30 MG tablet Take 1 tablet (30 mg total) by mouth 4 (four) times daily as needed (elevated heart rates). 03/17/19  Yes [name], Will Hassell Done, MD  fluticasone (FLONASE) 50 MCG/ACT nasal spray Place 2 sprays into both nostrils daily. Patient taking differently: Place 2 sprays into both nostrils daily as needed for allergies or rhinitis.  05/26/18  Yes Nafziger, Tommi Rumps, NP  ibuprofen (ADVIL,MOTRIN) 200 MG tablet Take 600 mg by mouth every 6 (six) hours as needed. migraine headache   Yes [provider]  losartan (COZAAR) 50  MG tablet TAKE 1 TABLET(50 MG) BY MOUTH DAILY Patient taking differently: Take 50 mg by mouth daily.  08/14/18  Yes Josue Hector, MD  nitroGLYCERIN (NITROSTAT) 0.4 MG SL tablet Place 1 tablet (0.4 mg total) under the tongue every 5 (five) minutes as needed for chest pain. 11/29/13  Yes Josue Hector, MD  omeprazole (PRILOSEC OTC) 20 MG tablet Take 20 mg by mouth daily as needed (acid reflux).    Yes [provider]    Inpatient Medications: Scheduled Meds: . aspirin EC  81 mg Oral Daily  . atorvastatin  40 mg Oral q1800  . diltiazem  300 mg Oral Daily  . enoxaparin (LOVENOX) injection  40 mg Subcutaneous Q24H  . losartan  50 mg Oral Daily   Continuous Infusions:  PRN Meds: acetaminophen, ondansetron (ZOFRAN) IV  Allergies:   No Known Allergies  Social History:   Social History   Socioeconomic History  .  Marital status: Divorced    Spouse name: Not on file  . Number of children: Not on file  . Years of education: Not on file  . Highest education level: Not on file  Occupational History  . Occupation: retired  Scientific laboratory technician  . Financial resource strain: Not on file  . Food insecurity    Worry: Not on file    Inability: Not on file  . Transportation needs    Medical: Not on file    Non-medical: Not on file  Tobacco Use  . Smoking status: Never Smoker  . Smokeless tobacco: Never Used  Substance and Sexual Activity  . Alcohol use: Yes    Comment: occ-Social per pt  . Drug use: No  . Sexual activity: Not Currently  Lifestyle  . Physical activity    Days per week: Not on file    Minutes per session: Not on file  . Stress: Not on file  Relationships  . Social Herbalist on phone: Not on file    Gets together: Not on file    Attends religious service: Not on file    Active member of club or organization: Not on file    Attends meetings of clubs or organizations: Not on file    Relationship status: Not on file  . Intimate partner violence     Fear of current or ex partner: Not on file    Emotionally abused: Not on file    Physically abused: Not on file    Forced sexual activity: Not on file  Other Topics Concern  . Not on file  Social History Narrative   Divorced 7 years ago   3 children   Works in Aeronautical engineer    Family History:    Family History  Problem Relation Age of Onset  . Heart disease Brother        CABG age 69, redo bypass ~50  . Heart disease Paternal Grandmother   . Heart disease Cousin        Maternal side  . Heart attack Brother   . Colon cancer Maternal Grandfather 60       METS to colon from Lung CA per pt  . Lung cancer Maternal Grandfather   . Colon polyps Neg Hx   . Esophageal cancer Neg Hx   . Rectal cancer Neg Hx   . Stomach cancer Neg Hx      ROS:  Please see the history of present illness.   All other ROS reviewed and negative.     Physical Exam/Data:   Vitals:   04/09/19 0022 04/09/19 0446 04/09/19 0633 04/09/19 0818  BP: 132/84 (!) 140/95  (!) 139/93  Pulse: 64 69  71  Resp: 17 16  15   Temp: 97.7 F (36.5 C) 97.8 F (36.6 C)  97.9 F (36.6 C)  TempSrc: Oral Oral  Oral  SpO2: 97% 98%  98%  Weight:   87.7 kg   Height:        Intake/Output Summary (Last 24 hours) at 04/09/2019 1122 Last data filed at 04/09/2019 0841 Gross per 24 hour  Intake 600 ml  Output 1425 ml  Net -825 ml   Last 3 Weights 04/09/2019 04/08/2019 03/05/2019  Weight (lbs) 193 lb 6.4 oz 195 lb 196 lb  Weight (kg) 87.726 kg 88.451 kg 88.905 kg     Body mass index is 26.97 kg/m.  General:  Well nourished, well developed, in no acute distress HEENT: normal  Lymph: no adenopathy Neck: no JVD Endocrine:  No thryomegaly Vascular: No carotid bruits; FA pulses 2+ bilaterally without bruits  Cardiac:  normal S1, S2; RRR; no murmur  Lungs:  clear to auscultation bilaterally, no wheezing, rhonchi or rales  Abd: soft, nontender, no hepatomegaly  Ext: no edema Musculoskeletal:  No deformities, BUE  and BLE strength normal and equal Skin: warm and dry  Neuro:  CNs 2-12 intact, no focal abnormalities noted Psych:  Normal affect   EKG:  The EKG was personally reviewed and demonstrates:  Sinus rhythm, Incomplete right bundle branch block, ST abnormality V1-3, similar to EKG in 10/2017 Telemetry:  Telemetry was personally reviewed and demonstrates: Sinus rhythm in the 60s with no ectopy  Relevant CV Studies:  Nuclear stress test 12/05/2017 Study Highlights   Nuclear stress EF: 62%.  Blood pressure demonstrated a normal response to exercise.  There was no ST segment deviation noted during stress.  This is a low risk study.   1. EF 62% with normal wall motion.  2. Fixed medium-sized, moderate intensity mid to apical anterior and true apex perfusion defect.  No evidence for ischemia.   Low risk study.  Despite normal wall motion, prior history of CABG suggests that this defect represents prior MI.       Echocardiogram 10/20/2015 Study Conclusions - Left ventricle: The cavity size was normal. Systolic function was   normal. The estimated ejection fraction was in the range of 60%   to 65%. Wall motion was normal; there were no regional wall   motion abnormalities. - Aortic valve: Possibly bicuspid; severely thickened, severely   calcified leaflets. Valve mobility was restricted. There was mild   stenosis. There was mild regurgitation. Peak gradient (S): 33 mm   Hg. Valve area (VTI): 1.86 cm^2. Valve area (Vmax): 2.65 cm^2.   Valve area (Vmean): 2.67 cm^2. - Aortic root: The aortic root was normal in size. - Mitral valve: There was mild regurgitation. - Left atrium: The atrium was normal in size. - Right ventricle: Systolic function was normal. - Right atrium: The atrium was normal in size. - Tricuspid valve: There was mild regurgitation. - Pulmonary arteries: Systolic pressure was within the normal   range. - Inferior vena cava: The vessel was normal in size. -  Pericardium, extracardiac: There was no pericardial effusion.   Cardiac cath 06/03/2014 IMPRESSIONS:  1. Absent left main coronary artery. 2. 80% mid left anterior descending artery stenosis.  Patent LIMA to LAD/Diagonal.  3. Mild disease left circumflex artery and its branches. 4. Patent nondominant right coronary artery. 5. Normal left ventricular systolic function.  LVEDP 6 mmHg.  Ejection fraction 55%.  RECOMMENDATION:  Continue medical therapy.  Cardiology follow-up with Dr. Johnsie Cancel.  Per the progress note from today, consider GI w/u.   Laboratory Data:  High Sensitivity Troponin:   Recent Labs  Lab 04/08/19 0916 04/08/19 1116  TROPONINIHS 4 5     Chemistry Recent Labs  Lab 04/08/19 0916  NA 140  K 3.9  CL 104  CO2 24  GLUCOSE 107*  BUN 12  CREATININE 0.93  CALCIUM 9.2  GFRNONAA >60  GFRAA >60  ANIONGAP 12    No results for input(s): PROT, ALBUMIN, AST, ALT, ALKPHOS, BILITOT in the last 168 hours. Hematology Recent Labs  Lab 04/08/19 0916  WBC 6.7  RBC 5.42  HGB 16.8  HCT 47.9  MCV 88.4  MCH 31.0  MCHC 35.1  RDW 12.2  PLT 268   BNPNo results for  input(s): BNP, PROBNP in the last 168 hours.  DDimer No results for input(s): DDIMER in the last 168 hours.   Radiology/Studies:  Dg Chest Port 1 View  Result Date: 04/08/2019 CLINICAL DATA:  Chest pain. EXAM: PORTABLE CHEST 1 VIEW COMPARISON:  November 16, 2017 FINDINGS: Stable postsurgical changes of CABG. Cardiomediastinal silhouette is normal. Mediastinal contours appear intact. Calcific atherosclerotic disease of the aorta. There is no evidence of focal airspace consolidation, pleural effusion or pneumothorax. Low lung volumes. Osseous structures are without acute abnormality. Soft tissues are grossly normal. IMPRESSION: 1. No acute cardiopulmonary disease. 2. Low lung volumes. Electronically Signed   By: Fidela Salisbury M.D.   On: 04/08/2019 10:23    Assessment and Plan:   Chest pain -Patient  has a history of chest pain in the past similar to his current presentation.  He had an normal stress Myoview in 10/2015 and 11/2017.  His symptoms have been occurring for several days with intermittent chest pain radiating to the neck, back, right arm and intermittent left arm tingling.  Yesterday morning his symptoms were slightly more severe, with weakness and shaking.  He denies any shortness of breath at all.  He usually is active and does yard work without any exertional chest discomfort. -High-sensitivity troponins are low and flat 4,5.  With his description of several hours of intermittent chest discomfort, would expect to see a rise in troponin if it was related to myocardial ischemia. -EKG without acute ischemic changes -Chest x-ray with no acute cardiopulmonary disease, low lung volumes. -Labs showed normal renal function and electrolytes.  Patient is not anemic.  TSH is in normal range. -Patient now feels that his symptoms are consistent with his prior panic attacks. -I do not feel like patient currently needs to have a cardiac catheterization.  Echocardiogram was done today. If no new LV dysfunction or wall motion abnormality, he may be able to have outpatient Lexiscan Myoview and follow up with Dr. Johnsie Cancel.   CAD -History of CABG with LIMA to LAD and D1 in 2013.  Cardiac cath in 2015 showed patent grafts. -The patient has had a normal Myoview in 10/2015 and 11/2017. -Patient continues on aspirin 81 mg and high intensity statin.  Atrial tachycardia -Followed by Dr. Curt Bears, controlled with diltiazem and flecainide in the past.  Patient had fatigue in the past with beta-blocker.  He was last seen in the office by Dr. Curt Bears on 03/02/2019 and due to his history of CAD he was switched from flecainide and diltiazem to Multaq.  Multaq was too expensive and the patient.  The patient did not discontinue diltiazem.  He is currently asymptomatic. -Continue current diltiazem.  Hypertension -On  diltiazem 300 mg daily, losartan 50 mg daily -Blood pressure is currently slightly above optimal, 120s-140s/80s-90s  Hyperlipidemia -On atorvastatin 40 mg daily -LDL checked yesterday was 78 (was 55 last year).  Continue current therapy.      For questions or updates, please contact [place] Please consult [email or web address] for contact info under     Signed, Daune Perch, NP  04/09/2019 11:22 AM

## 2019-04-12 ENCOUNTER — Telehealth: Payer: Self-pay | Admitting: *Deleted

## 2019-04-12 NOTE — Telephone Encounter (Signed)
Transition Care Management Follow-up Telephone Call   Date discharged? 04/09/2019   How have you been since you were released from the hospital? Patient reports "I just feel tired"    Do you understand why you were in the hospital? yes   Do you understand the discharge instructions? yes   Where were you discharged to? Home    Items Reviewed:  Medications reviewed: yes  Allergies reviewed: No, NKA   Dietary changes reviewed: yes, Heart Healthy   Referrals reviewed: yes, Cardiology    Functional Questionnaire:   Activities of Daily Living (ADLs):   He states they are independent in the following: ambulation, bathing and hygiene, feeding, continence, grooming, toileting and dressing States they require assistance with the following: N/A    Any transportation issues/concerns?: no   Any patient concerns? Yes wants to know what could cause him to feel so tired.    Confirmed importance and date/time of follow-up visits scheduled yes   Provider Appointment booked with [name], NP 04/13/2019 at [facility] for virtual visit.   Confirmed with patient if condition begins to worsen call PCP or go to the ER.  Patient was given the office number and encouraged to call back with question or concerns.  : yes

## 2019-04-13 ENCOUNTER — Telehealth (INDEPENDENT_AMBULATORY_CARE_PROVIDER_SITE_OTHER): Payer: Medicare Other | Admitting: Adult Health

## 2019-04-13 ENCOUNTER — Other Ambulatory Visit: Payer: Self-pay

## 2019-04-13 DIAGNOSIS — F419 Anxiety disorder, unspecified: Secondary | ICD-10-CM

## 2019-04-13 DIAGNOSIS — R0789 Other chest pain: Secondary | ICD-10-CM

## 2019-04-13 DIAGNOSIS — F329 Major depressive disorder, single episode, unspecified: Secondary | ICD-10-CM

## 2019-04-13 DIAGNOSIS — F32A Depression, unspecified: Secondary | ICD-10-CM

## 2019-04-13 MED ORDER — CITALOPRAM HYDROBROMIDE 10 MG PO TABS: 10.0000 mg | ORAL_TABLET | Freq: Every day | ORAL | 1 refills | Status: DC

## 2019-04-13 NOTE — Progress Notes (Signed)
Virtual Visit via Video Note  I connected with Terry Hansen on 04/13/19 at  2:00 PM EDT by a video enabled telemedicine application and verified that I am speaking with the correct person using two identifiers.  Location patient: home Location provider:work or home office Persons participating in the virtual visit: patient, provider  I discussed the limitations of evaluation and management by telemedicine and the availability of in person appointments. The patient expressed understanding and agreed to proceed.   HPI:  63 year old male who is being evaluated today for TCM visit  Admit Date: 04/08/2019 Discharge Date 04/09/2019  Presented to the emergency room with the complaint of chest pain.  He reported that he had been having intermittent chest pain over the last week.  This pain started in his left arm, moves across his chest and into the neck and back.  Pain was not constant, but on the day he presented to the emergency room it radiated into his right arm, lasted a few hours and then resolved for a few hours.  He denied exertional episodes.  His symptoms resolve spontaneously.  He took nitro the night before and this did not help.  He also reported history of significant anxiety with panic disorder and wondered if his symptoms were associated with anxiety.  In the ER his troponins were negative, EKG showed no acute ST changes.  And he was admitted for further management.  Upon discharge the patient denied any further chest pain episodes reported that his chest pain felt different from his usual panic attacks earlier, but later stated that it was like his regular panic attacks.  He was advised to seek outpatient mental health for his panic attacks, but patient declined, stating that his brother is a Social worker and usually does help him with his attacks.  He was then advised to seek care for his panic attacks with his PCP.  He denied shortness of breath, diaphoresis, dizziness,  fevers/chills, nausea/vomiting, abdominal pain.  Hospital Course  Chest Pain - h/o CABG in 2013 -Possibly atypical/noncardiac -Troponins negative, EKG with no acute ST changes, chest x-ray unremarkable -Heart pathway score was 6, indicating that the patient has an elevated risk or requires further evaluation.  He has a follow-up with cardiology for outpatient Lexiscan Myoview -Cardiac cath in 2015 showed patent grafts -Echo with normal EF, otherwise unremarkable  Atria Tachycardia  -Patient was last seen Dr. Curt Bears on March 02, 2019 and appeared that the patient was advised to stop flecainide due to history of CAD/CABG -Continue Cardizem CD with short acting diltiazem as needed as well  HTN  -Continue Cardizem and Cozaar  HLD - Continue Lipitor  Anxiety and Depression  -Would like to discuss this today  Since being discharged from the hospital he denies any recurrent episodes of chest pain.  He does feel as though this was related to anxiety possible panic attacks.  He continues to feel fatigued and tired all the time but contributes this to depression.  Back in July 2020 we discussed this at length but he refused medications at that time.  He is getting outside more often and working in the yard denies any exertional shortness of breath.  At this time he would like to start something low-dose to see if he can get rid of the panic attacks and subsequently stopped having chest pain.  He will make a follow-up with cardiology for outpatient stress test.   ROS: See pertinent positives and negatives per HPI.  Past Medical History:  Diagnosis  Date  . CAD (coronary artery disease)    a.  LHC 06/26/12:  pLAD 90%, prox/mid/dist CFS 20-30%, oRCA 100% (non dom), EF 55%;  b. Echo 12/13:  EF 55-60%, Gr 1 diast dysfn;  c. s/p CABG [facility]) 12/13: L-LAD/Dx    . GERD (gastroesophageal reflux disease)   . HLD (hyperlipidemia)   . Hypertension   . Ocular migraine    "maybe once q couple  months" (06/02/2014)  . Unstable angina pectoris [facility])     Past Surgical History:  Procedure Laterality Date  . APPENDECTOMY  1970's  . CARDIAC CATHETERIZATION  06/2012  . CORONARY ARTERY BYPASS GRAFT  06/29/2012   Procedure: CORONARY ARTERY BYPASS GRAFTING (CABG);  Surgeon: Melrose Nakayama, MD;  Location: [place];  Service: Open Heart Surgery;  Laterality: N/A;  times two using Left Internal Mammary Artery  . INGUINAL HERNIA REPAIR Bilateral [phone]  . LEFT HEART CATHETERIZATION WITH CORONARY ANGIOGRAM N/A 06/26/2012   Procedure: LEFT HEART CATHETERIZATION WITH CORONARY ANGIOGRAM;  Surgeon: Josue Hector, MD;  Location: [facility] CATH LAB;  Service: Cardiovascular;  Laterality: N/A;  . LEFT HEART CATHETERIZATION WITH CORONARY/GRAFT ANGIOGRAM N/A 06/03/2014   Procedure: LEFT HEART CATHETERIZATION WITH Beatrix Fetters;  Surgeon: Jettie Booze, MD;  Location[facility] CATH LAB;  Service: Cardiovascular;  Laterality: N/A;  . NASAL SEPTUM SURGERY     For uncontrolled epistaxis early 2013  . SIGMOIDOSCOPY  10-15 years ago   in [place], [place]    Family History  Problem Relation Age of Onset  . Heart disease Brother        CABG age 70, redo bypass ~50  . Heart disease Paternal Grandmother   . Heart disease Cousin        Maternal side  . Heart attack Brother   . Colon cancer Maternal Grandfather 60       METS to colon from Lung CA per pt  . Lung cancer Maternal Grandfather   . Colon polyps Neg Hx   . Esophageal cancer Neg Hx   . Rectal cancer Neg Hx   . Stomach cancer Neg Hx      Current Outpatient Medications:  .  aspirin EC 81 MG tablet, Take 1 tablet (81 mg total) by mouth daily., Disp: , Rfl:  .  atorvastatin (LIPITOR) 40 MG tablet, TAKE 1 TABLET BY MOUTH EVERY DAY AT 6 PM (Patient taking differently: Take 40 mg by mouth daily at 6 PM. ), Disp: 90 tablet, Rfl: 1 .  Cetirizine HCl (ZYRTEC PO), Take 10 mg by mouth daily as needed (allergies). [facility] Brand , Disp: , Rfl:   .  Coenzyme Q10 200 MG capsule, Take 200 mg by mouth every morning. disontinue while in hospital, Disp: , Rfl:  .  diltiazem (CARDIZEM CD) 300 MG 24 hr capsule, Take 300 mg by mouth daily., Disp: , Rfl:  .  diltiazem (CARDIZEM) 30 MG tablet, Take 1 tablet (30 mg total) by mouth 4 (four) times daily as needed (elevated heart rates)., Disp: 30 tablet, Rfl: 3 .  fluticasone (FLONASE) 50 MCG/ACT nasal spray, Place 2 sprays into both nostrils daily. (Patient taking differently: Place 2 sprays into both nostrils daily as needed for allergies or rhinitis. ), Disp: 16 g, Rfl: 6 .  ibuprofen (ADVIL,MOTRIN) 200 MG tablet, Take 600 mg by mouth every 6 (six) hours as needed. migraine headache, Disp: , Rfl:  .  losartan (COZAAR) 50 MG tablet, TAKE 1 TABLET(50 MG) BY MOUTH DAILY (Patient taking differently: Take  50 mg by mouth daily. ), Disp: 90 tablet, Rfl: 2 .  nitroGLYCERIN (NITROSTAT) 0.4 MG SL tablet, Place 1 tablet (0.4 mg total) under the tongue every 5 (five) minutes as needed for chest pain., Disp: 25 tablet, Rfl: 3 .  omeprazole (PRILOSEC OTC) 20 MG tablet, Take 20 mg by mouth daily as needed (acid reflux). , Disp: , Rfl:   EXAM:  VITALS per patient if applicable:  GENERAL: alert, oriented, appears well and in no acute distress  HEENT: atraumatic, conjunttiva clear, no obvious abnormalities on inspection of external nose and ears  NECK: normal movements of the head and neck  LUNGS: on inspection no signs of respiratory distress, breathing rate appears normal, no obvious gross SOB, gasping or wheezing  CV: no obvious cyanosis  MS: moves all visible extremities without noticeable abnormality  PSYCH/NEURO: pleasant and cooperative, no obvious depression or anxiety, speech and thought processing grossly intact  ASSESSMENT AND PLAN:  Discussed the following assessment and plan:  1. Anxiety and depression - citalopram (CELEXA) 10 MG tablet; Take 1 tablet (10 mg total) by mouth daily.   Dispense: 30 tablet; Refill: 1  2. Atypical chest pain -I think likely due to anxiety and panic attacks.  We will trial him on Celexa 10 mg and have him follow-up in 1 month.  Due to cardiac history though I encouraged him to continue to follow-up with cardiology as directed    I discussed the assessment and treatment plan with the patient. The patient was provided an opportunity to ask questions and all were answered. The patient agreed with the plan and demonstrated an understanding of the instructions.   The patient was advised to call back or seek an in-person evaluation if the symptoms worsen or if the condition fails to improve as anticipated.   [name], NP

## 2019-04-15 ENCOUNTER — Other Ambulatory Visit: Payer: Self-pay | Admitting: Adult Health

## 2019-04-16 NOTE — Telephone Encounter (Signed)
Sent to the pharmacy by e-scribe for 90 days.  Pt has upcoming cpx. 

## 2019-04-26 ENCOUNTER — Other Ambulatory Visit: Payer: Self-pay | Admitting: Cardiovascular Disease

## 2019-05-27 ENCOUNTER — Other Ambulatory Visit: Payer: Self-pay | Admitting: Cardiovascular Disease

## 2019-05-27 MED ORDER — LOSARTAN POTASSIUM 50 MG PO TABS: ORAL_TABLET | ORAL | 0 refills | Status: DC

## 2019-06-10 ENCOUNTER — Other Ambulatory Visit: Payer: Self-pay | Admitting: Adult Health

## 2019-06-10 DIAGNOSIS — F329 Major depressive disorder, single episode, unspecified: Secondary | ICD-10-CM

## 2019-06-10 DIAGNOSIS — F32A Depression, unspecified: Secondary | ICD-10-CM

## 2019-06-10 MED ORDER — CITALOPRAM HYDROBROMIDE 10 MG PO TABS: 10.0000 mg | ORAL_TABLET | Freq: Every day | ORAL | 1 refills | Status: DC

## 2019-06-10 NOTE — Telephone Encounter (Signed)
Please see rx refill request  °

## 2019-06-10 NOTE — Telephone Encounter (Signed)
Medication Refill - Medication:citalopram (CELEXA) 10 MG tablet  Patient only has one pill left.  Would like it filled as soon as possible  Preferred Pharmacy (with phone number or street name):  [facility] [place], [place] - [address] [place] [phone] (Phone) [phone] (Fax)     Agent: Please be advised that RX refills may take up to 3 business days. We ask that you follow-up with your pharmacy.

## 2019-06-10 NOTE — Telephone Encounter (Signed)
Pt has one pill left and called to see if this can be sent today/ please advise

## 2019-06-14 ENCOUNTER — Other Ambulatory Visit: Payer: Self-pay

## 2019-06-14 ENCOUNTER — Other Ambulatory Visit: Payer: Self-pay | Admitting: Cardiovascular Disease

## 2019-06-14 MED ORDER — LOSARTAN POTASSIUM 50 MG PO TABS: ORAL_TABLET | ORAL | 0 refills | Status: DC

## 2019-06-15 ENCOUNTER — Ambulatory Visit (INDEPENDENT_AMBULATORY_CARE_PROVIDER_SITE_OTHER): Payer: Medicare Other | Admitting: Adult Health

## 2019-06-15 ENCOUNTER — Encounter: Payer: Self-pay | Admitting: Adult Health

## 2019-06-15 VITALS — BP 126/84 | Temp 97.8°F | Ht 71.5 in | Wt 198.0 lb

## 2019-06-15 DIAGNOSIS — I2510 Atherosclerotic heart disease of native coronary artery without angina pectoris: Secondary | ICD-10-CM

## 2019-06-15 DIAGNOSIS — I10 Essential (primary) hypertension: Secondary | ICD-10-CM

## 2019-06-15 DIAGNOSIS — R972 Elevated prostate specific antigen [PSA]: Secondary | ICD-10-CM | POA: Diagnosis not present

## 2019-06-15 DIAGNOSIS — F329 Major depressive disorder, single episode, unspecified: Secondary | ICD-10-CM

## 2019-06-15 DIAGNOSIS — F419 Anxiety disorder, unspecified: Secondary | ICD-10-CM | POA: Diagnosis not present

## 2019-06-15 DIAGNOSIS — Z23 Encounter for immunization: Secondary | ICD-10-CM | POA: Diagnosis not present

## 2019-06-15 DIAGNOSIS — F32A Depression, unspecified: Secondary | ICD-10-CM

## 2019-06-15 LAB — CBC WITH DIFFERENTIAL/PLATELET
Basophils Absolute: 0 10*3/uL (ref 0.0–0.1)
Basophils Relative: 0.4 % (ref 0.0–3.0)
Eosinophils Absolute: 0.2 10*3/uL (ref 0.0–0.7)
Eosinophils Relative: 2.5 % (ref 0.0–5.0)
HCT: 46.3 % (ref 39.0–52.0)
Hemoglobin: 16.1 g/dL (ref 13.0–17.0)
Lymphocytes Relative: 24.2 % (ref 12.0–46.0)
Lymphs Abs: 1.8 10*3/uL (ref 0.7–4.0)
MCHC: 34.7 g/dL (ref 30.0–36.0)
MCV: 86.9 fl (ref 78.0–100.0)
Monocytes Absolute: 0.5 10*3/uL (ref 0.1–1.0)
Monocytes Relative: 7 % (ref 3.0–12.0)
Neutro Abs: 4.9 10*3/uL (ref 1.4–7.7)
Neutrophils Relative %: 65.9 % (ref 43.0–77.0)
Platelets: 262 10*3/uL (ref 150.0–400.0)
RBC: 5.33 Mil/uL (ref 4.22–5.81)
RDW: 12.9 % (ref 11.5–15.5)
WBC: 7.5 10*3/uL (ref 4.0–10.5)

## 2019-06-15 LAB — COMPREHENSIVE METABOLIC PANEL
ALT: 25 U/L (ref 0–53)
AST: 17 U/L (ref 0–37)
Albumin: 3.9 g/dL (ref 3.5–5.2)
Alkaline Phosphatase: 117 U/L (ref 39–117)
BUN: 13 mg/dL (ref 6–23)
CO2: 28 mEq/L (ref 19–32)
Calcium: 9 mg/dL (ref 8.4–10.5)
Chloride: 100 mEq/L (ref 96–112)
Creatinine, Ser: 0.85 mg/dL (ref 0.40–1.50)
GFR: 90.24 mL/min (ref >60.00)
Glucose, Bld: 98 mg/dL (ref 70–99)
Potassium: 3.7 mEq/L (ref 3.5–5.1)
Sodium: 135 mEq/L (ref 135–145)
Total Bilirubin: 0.7 mg/dL (ref 0.2–1.2)
Total Protein: 6.6 g/dL (ref 6.0–8.3)

## 2019-06-15 LAB — LIPID PANEL
Cholesterol: 130 mg/dL (ref 0–200)
HDL: 45.1 mg/dL (ref >39.00)
LDL Cholesterol: 71 mg/dL (ref 0–99)
NonHDL: 84.47
Total CHOL/HDL Ratio: 3
Triglycerides: 68 mg/dL (ref 0.0–149.0)
VLDL: 13.6 mg/dL (ref 0.0–40.0)

## 2019-06-15 LAB — TSH: TSH: 1.82 u[IU]/mL (ref 0.35–4.50)

## 2019-06-15 LAB — PSA: PSA: 0.97 ng/mL (ref 0.10–4.00)

## 2019-06-15 NOTE — Patient Instructions (Signed)
It was great seeing you today   Please continue on current medication therapy   Work on exercise and diet   Follow up in one year or sooner if needed

## 2019-06-15 NOTE — Progress Notes (Signed)
Subjective:    Patient ID: Terry Hansen, male    DOB: [birth year], 63 y.o.   MRN: [id number]  HPI  Patient presents for yearly preventative medicine examination. He is a pleasant 63 year old male who  has a past medical history of CAD (coronary artery disease), GERD (gastroesophageal reflux disease), HLD (hyperlipidemia), Hypertension, Ocular migraine, and Unstable angina pectoris ([place]).  CAD s/p GABG -followed by cardiology.  He is currently prescribed Lipitor 40 mg, aspirin 81 mg, Cardizem 300 mg daily  Lab Results  Component Value Date   CHOL 134 04/08/2019   HDL 47 04/08/2019   LDLCALC 78 04/08/2019   TRIG 45 04/08/2019   CHOLHDL 2.9 04/08/2019    Hypertension -currently prescribed losartan 50 mg daily.  He denies dizziness, lightheadedness, chest pain, shortness of breath, or syncopal episodes BP Readings from Last 3 Encounters:  06/15/19 126/84  04/09/19 130/79  03/05/19 103/66   Anxiety and Depression -he has a history of significant anxiety and panic disorder as well as depression.  He is done well without medications in the past but about two months ago he was started on Celexa. He reports that since starting this medication he is feeling a lot better. He reports that he is enjoying life once again, is having less panic attacks and feels happier.   Elevated PSA  Lab Results  Component Value Date   PSA 2.89 04/09/2018   PSA 5.69 (H) 03/31/2018   PSA 1.03 01/27/2017   All immunizations and health maintenance protocols were reviewed with the patient and needed orders were placed.  He is due for flu and his first pneumonia vaccination  Appropriate screening laboratory values were ordered for the patient including screening of hyperlipidemia, renal function and hepatic function. If indicated by BPH, a PSA was ordered.  Medication reconciliation,  past medical history, social history, problem list and allergies were reviewed in detail with the patient  Goals were  established with regard to weight loss, exercise, and  diet in compliance with medications.  Wt Readings from Last 3 Encounters:  06/15/19 198 lb (89.8 kg)  04/09/19 193 lb 6.4 oz (87.7 kg)  03/05/19 196 lb (88.9 kg)   End of life planning was discussed.  He is up-to-date on routine screening colonoscopy, did Cologuard in 2019.  Review of Systems  Constitutional: Negative.   HENT: Negative.   Eyes: Negative.   Respiratory: Negative.   Cardiovascular: Negative.   Gastrointestinal: Negative.   Endocrine: Negative.   Genitourinary: Negative.   Musculoskeletal: Negative.   Skin: Negative.   Allergic/Immunologic: Negative.   Neurological: Negative.   Hematological: Negative.   Psychiatric/Behavioral: Negative.   All other systems reviewed and are negative.  Past Medical History:  Diagnosis Date  . CAD (coronary artery disease)    a.  LHC 06/26/12:  pLAD 90%, prox/mid/dist CFS 20-30%, oRCA 100% (non dom), EF 55%;  b. Echo 12/13:  EF 55-60%, Gr 1 diast dysfn;  c. s/p CABG [facility] Hockey) 12/13: L-LAD/Dx    . GERD (gastroesophageal reflux disease)   . HLD (hyperlipidemia)   . Hypertension   . Ocular migraine    "maybe once q couple months" (06/02/2014)  . Unstable angina pectoris [facility])     Social History   Socioeconomic History  . Marital status: Divorced    Spouse name: Not on file  . Number of children: Not on file  . Years of education: Not on file  . Highest education level: Not on file  Occupational History  .  Occupation: retired  Scientific laboratory technician  . Financial resource strain: Not on file  . Food insecurity    Worry: Not on file    Inability: Not on file  . Transportation needs    Medical: Not on file    Non-medical: Not on file  Tobacco Use  . Smoking status: Never Smoker  . Smokeless tobacco: Never Used  Substance and Sexual Activity  . Alcohol use: Yes    Comment: occ-Social per pt  . Drug use: No  . Sexual activity: Not Currently  Lifestyle  . Physical  activity    Days per week: Not on file    Minutes per session: Not on file  . Stress: Not on file  Relationships  . Social Herbalist on phone: Not on file    Gets together: Not on file    Attends religious service: Not on file    Active member of club or organization: Not on file    Attends meetings of clubs or organizations: Not on file    Relationship status: Not on file  . Intimate partner violence    Fear of current or ex partner: Not on file    Emotionally abused: Not on file    Physically abused: Not on file    Forced sexual activity: Not on file  Other Topics Concern  . Not on file  Social History Narrative   Divorced 7 years ago   3 children   Works in Aeronautical engineer    Past Surgical History:  Procedure Laterality Date  . APPENDECTOMY  1970's  . CARDIAC CATHETERIZATION  06/2012  . CORONARY ARTERY BYPASS GRAFT  06/29/2012   Procedure: CORONARY ARTERY BYPASS GRAFTING (CABG);  Surgeon: Melrose Nakayama, MD;  Location: [place];  Service: Open Heart Surgery;  Laterality: N/A;  times two using Left Internal Mammary Artery  . INGUINAL HERNIA REPAIR Bilateral [phone]  . LEFT HEART CATHETERIZATION WITH CORONARY ANGIOGRAM N/A 06/26/2012   Procedure: LEFT HEART CATHETERIZATION WITH CORONARY ANGIOGRAM;  Surgeon: Josue Hector, MD;  Location: [facility] CATH LAB;  Service: Cardiovascular;  Laterality: N/A;  . LEFT HEART CATHETERIZATION WITH CORONARY/GRAFT ANGIOGRAM N/A 06/03/2014   Procedure: LEFT HEART CATHETERIZATION WITH Beatrix Fetters;  Surgeon: Jettie Booze, MD;  Location: [facility] CATH LAB;  Service: Cardiovascular;  Laterality: N/A;  . NASAL SEPTUM SURGERY     For uncontrolled epistaxis early 2013  . SIGMOIDOSCOPY  10-15 years ago   in [place], [place]    Family History  Problem Relation Age of Onset  . Heart disease Brother        CABG age 66, redo bypass ~50  . Heart disease Paternal Grandmother   . Heart disease Cousin        Maternal side   . Heart attack Brother   . Colon cancer Maternal Grandfather 60       METS to colon from Lung CA per pt  . Lung cancer Maternal Grandfather   . Colon polyps Neg Hx   . Esophageal cancer Neg Hx   . Rectal cancer Neg Hx   . Stomach cancer Neg Hx     No Known Allergies  Current Outpatient Medications on File Prior to Visit  Medication Sig Dispense Refill  . aspirin EC 81 MG tablet Take 1 tablet (81 mg total) by mouth daily.    Marland Kitchen atorvastatin (LIPITOR) 40 MG tablet Take 1 tablet (40 mg total) by mouth at bedtime. 90 tablet 0  .  Cetirizine HCl (ZYRTEC PO) Take 10 mg by mouth daily as needed (allergies). [facility] Brand     . citalopram (CELEXA) 10 MG tablet Take 1 tablet (10 mg total) by mouth daily. 30 tablet 1  . Coenzyme Q10 200 MG capsule Take 200 mg by mouth every morning. disontinue while in hospital    . diltiazem (CARDIZEM CD) 300 MG 24 hr capsule Take 300 mg by mouth daily.    Marland Kitchen diltiazem (CARDIZEM) 30 MG tablet Take 1 tablet (30 mg total) by mouth 4 (four) times daily as needed (elevated heart rates). 30 tablet 3  . fluticasone (FLONASE) 50 MCG/ACT nasal spray Place 2 sprays into both nostrils daily. (Patient taking differently: Place 2 sprays into both nostrils daily as needed for allergies or rhinitis. ) 16 g 6  . ibuprofen (ADVIL,MOTRIN) 200 MG tablet Take 600 mg by mouth every 6 (six) hours as needed. migraine headache    . losartan (COZAAR) 50 MG tablet TAKE 1 TABLET(50 MG) BY MOUTH DAILY. Please keep upcoming appt in January with Dr. Johnsie Cancel before anymore refills. Thank you 90 tablet 0  . nitroGLYCERIN (NITROSTAT) 0.4 MG SL tablet Place 1 tablet (0.4 mg total) under the tongue every 5 (five) minutes as needed for chest pain. 25 tablet 3  . omeprazole (PRILOSEC OTC) 20 MG tablet Take 20 mg by mouth daily as needed (acid reflux).      No current facility-administered medications on file prior to visit.     BP 126/84   Temp 97.8 F (36.6 C) (Temporal)   Ht 5' 11.5" (1.816 m)    Wt 198 lb (89.8 kg)   BMI 27.23 kg/m       Objective:   Physical Exam Vitals signs and nursing note reviewed.  Constitutional:      General: He is not in acute distress.    Appearance: Normal appearance. He is well-developed and overweight. He is not ill-appearing, toxic-appearing or diaphoretic.  HENT:     Head: Normocephalic and atraumatic.     Right Ear: Tympanic membrane, ear canal and external ear normal. There is no impacted cerumen.     Left Ear: Tympanic membrane, ear canal and external ear normal. There is no impacted cerumen.     Nose: Nose normal. No congestion or rhinorrhea.     Mouth/Throat:     Mouth: Mucous membranes are moist.     Pharynx: Oropharynx is clear. No oropharyngeal exudate or posterior oropharyngeal erythema.  Eyes:     General:        Right eye: No discharge.        Left eye: No discharge.     Pupils: Pupils are equal, round, and reactive to light.  Neck:     Trachea: No tracheal deviation.  Cardiovascular:     Rate and Rhythm: Normal rate and regular rhythm.     Pulses: Normal pulses.     Heart sounds: Normal heart sounds. No murmur. No friction rub. No gallop.   Pulmonary:     Effort: Pulmonary effort is normal. No respiratory distress.     Breath sounds: Normal breath sounds. No stridor. No wheezing, rhonchi or rales.  Chest:     Chest wall: No tenderness.  Abdominal:     General: Bowel sounds are normal. There is no distension.     Palpations: Abdomen is soft. There is no mass.     Tenderness: There is no abdominal tenderness. There is no right CVA tenderness, left CVA tenderness, guarding or  rebound.     Hernia: No hernia is present.  Musculoskeletal: Normal range of motion.        General: No swelling, tenderness, deformity or signs of injury.     Right lower leg: No edema.     Left lower leg: No edema.  Lymphadenopathy:     Cervical: No cervical adenopathy.  Skin:    General: Skin is warm and dry.     Capillary Refill: Capillary  refill takes less than 2 seconds.     Coloration: Skin is not jaundiced or pale.     Findings: No bruising, erythema, lesion or rash.  Neurological:     General: No focal deficit present.     Mental Status: He is alert and oriented to person, place, and time. Mental status is at baseline.     Cranial Nerves: No cranial nerve deficit.     Sensory: No sensory deficit.     Motor: No weakness.     Coordination: Coordination normal.     Gait: Gait normal.     Deep Tendon Reflexes: Reflexes normal.  Psychiatric:        Mood and Affect: Mood normal.        Behavior: Behavior normal.        Thought Content: Thought content normal.        Judgment: Judgment normal.       Assessment & Plan:  1. Anxiety and depression - Much better controlled. No change in medication therapy at this time  - CBC with Differential/Platelet - CMP - Lipid panel - TSH  2. Essential hypertension - Well controlled. No change in medications  - CBC with Differential/Platelet - CMP - Lipid panel - TSH  3. Coronary artery disease involving native coronary artery of native heart without angina pectoris - Follow up with Cardiology as directed. Continue with statin, asa and Cardizem.  - CBC with Differential/Platelet - CMP - Lipid panel - TSH  4. Atherosclerosis of native coronary artery of native heart without angina pectoris - Follow up with Cardiology as directed. Continue with statin, asa and Cardizem.  - CBC with Differential/Platelet - CMP - Lipid panel - TSH  5. Elevated PSA  - PSA  [name], NP

## 2019-06-15 NOTE — Addendum Note (Signed)
Addended by: Miles Costain T on: 06/15/2019 01:40 PM   Modules accepted: Orders

## 2019-07-10 ENCOUNTER — Encounter (HOSPITAL_COMMUNITY): Payer: Self-pay | Admitting: Emergency Medicine

## 2019-07-10 ENCOUNTER — Other Ambulatory Visit: Payer: Self-pay

## 2019-07-10 ENCOUNTER — Emergency Department (HOSPITAL_COMMUNITY): Payer: Medicare Other

## 2019-07-10 ENCOUNTER — Emergency Department (HOSPITAL_COMMUNITY)
Admission: EM | Admit: 2019-07-10 | Discharge: 2019-07-10 | Disposition: A | Discharge status: Home / Self Care | Payer: Medicare Other | Attending: Emergency Medicine | Admitting: Emergency Medicine

## 2019-07-10 DIAGNOSIS — J45909 Unspecified asthma, uncomplicated: Secondary | ICD-10-CM | POA: Insufficient documentation

## 2019-07-10 DIAGNOSIS — Z7982 Long term (current) use of aspirin: Secondary | ICD-10-CM | POA: Diagnosis not present

## 2019-07-10 DIAGNOSIS — I10 Essential (primary) hypertension: Secondary | ICD-10-CM | POA: Diagnosis not present

## 2019-07-10 DIAGNOSIS — Z79899 Other long term (current) drug therapy: Secondary | ICD-10-CM | POA: Insufficient documentation

## 2019-07-10 DIAGNOSIS — R0789 Other chest pain: Secondary | ICD-10-CM | POA: Diagnosis not present

## 2019-07-10 DIAGNOSIS — I2510 Atherosclerotic heart disease of native coronary artery without angina pectoris: Secondary | ICD-10-CM | POA: Insufficient documentation

## 2019-07-10 LAB — CBC
HCT: 44.2 % (ref 39.0–52.0)
Hemoglobin: 15 g/dL (ref 13.0–17.0)
MCH: 29.4 pg (ref 26.0–34.0)
MCHC: 33.9 g/dL (ref 30.0–36.0)
MCV: 86.5 fL (ref 80.0–100.0)
Platelets: 257 10*3/uL (ref 150–400)
RBC: 5.11 MIL/uL (ref 4.22–5.81)
RDW: 12.1 % (ref 11.5–15.5)
WBC: 7.5 10*3/uL (ref 4.0–10.5)
nRBC: 0 % (ref 0.0–0.2)

## 2019-07-10 LAB — BASIC METABOLIC PANEL
Anion gap: 9 (ref 5–15)
BUN: 10 mg/dL (ref 8–23)
CO2: 27 mmol/L (ref 22–32)
Calcium: 8.9 mg/dL (ref 8.9–10.3)
Chloride: 102 mmol/L (ref 98–111)
Creatinine, Ser: 0.95 mg/dL (ref 0.61–1.24)
GFR calc Af Amer: >60 mL/min (ref >60)
GFR calc non Af Amer: >60 mL/min (ref >60)
Glucose, Bld: 106 mg/dL — ABNORMAL HIGH (ref 70–99)
Potassium: 3.6 mmol/L (ref 3.5–5.1)
Sodium: 138 mmol/L (ref 135–145)

## 2019-07-10 LAB — TROPONIN I (HIGH SENSITIVITY)
Troponin I (High Sensitivity): 5 ng/L (ref <18)
Troponin I (High Sensitivity): 6 ng/L (ref <18)

## 2019-07-10 LAB — TSH: TSH: 1.802 u[IU]/mL (ref 0.350–4.500)

## 2019-07-10 LAB — D-DIMER, QUANTITATIVE: D-Dimer, Quant: <0.27 ug/mL-FEU (ref 0.00–0.50)

## 2019-07-10 MED ORDER — SODIUM CHLORIDE 0.9% FLUSH: 3.0000 mL | Freq: Once | INTRAVENOUS | Status: DC

## 2019-07-10 NOTE — Discharge Instructions (Addendum)
You were seen today for chest pain and some tachycardia.  You also stated that you did not feel well.  In general your work-up is largely reassuring.  You did have Covid testing given current pandemic.  This is pending and you should isolate until test results return.  Follow-up with your cardiologist.

## 2019-07-10 NOTE — ED Triage Notes (Signed)
Pt brought to ED by [facility] from home for c/o cp for almost a week getting worse tonight. Hx of bypass and anxiety, going on Tachy to brady on ems EKG. 324 mg of aspirin given pta. BP 165/98, HR 50-156, R-20 SPO2 99 on RA, temp 98.6. pt is AO x 4 NAD noticed during triage.

## 2019-07-10 NOTE — ED Provider Notes (Signed)
[place] EMERGENCY DEPARTMENT Provider Note   CSN: [id number] Arrival date & time: 07/10/19  0210     History Chief Complaint  Patient presents with  . Chest Pain    Terry Hansen is a 63 y.o. male.  HPI     This is a 63 year old male with a history of coronary artery disease, hypertension, hyperlipidemia who presents with chest achiness.  Patient reports over the last week he has had intermittent chest discomfort which he describes as achiness.  It seems to come and go.  It is not associated with exertion or eating.  He has never had symptoms like this before.  He does have a history of anxiety but states that he has not had any increased stressors in his life.  Tonight he fell asleep and woke up with this achiness.  Per EMS report, he was noted to have a heart rate that varied between the 50s to the 150s.  Patient denies any history of arrhythmias.  No known history of blood clots.  Denies leg swelling.  Denies any recent fevers or cough.  No known Covid exposures.  Patient on chart review, it does appear the patient has a history of intermittent atrial tachycardia.  He has previously seen Dr. Curt Bears.  Past Medical History:  Diagnosis Date  . CAD (coronary artery disease)    a.  LHC 06/26/12:  pLAD 90%, prox/mid/dist CFS 20-30%, oRCA 100% (non dom), EF 55%;  b. Echo 12/13:  EF 55-60%, Gr 1 diast dysfn;  c. s/p CABG Roxan Hockey) 12/13: L-LAD/Dx    . GERD (gastroesophageal reflux disease)   . HLD (hyperlipidemia)   . Hypertension   . Ocular migraine    "maybe once q couple months" (06/02/2014)  . Unstable angina pectoris [facility])     Patient Active Problem List   Diagnosis Date Noted  . Chest pain 04/08/2019  . Ocular migraine   . HLD (hyperlipidemia)   . Atopic dermatitis 01/27/2017  . PAT (paroxysmal atrial tachycardia) ([place]) 10/22/2015  . Palpitations 10/19/2015  . Chronotropic incompetence with sinus node dysfunction (HCC) 10/19/2015  . Anxiety  and depression 10/24/2014  . Unstable angina pectoris ([place]) 06/27/2014  . Chest pain, mid sternal 06/02/2014  . Routine history and physical examination of adult 01/31/2014  . Gastroesophageal reflux disease 01/31/2014  . HTN (hypertension) 03/01/2013  . Eustachian tube dysfunction 02/24/2013  . Labyrinthitis 02/24/2013  . Hypertension, benign 12/26/2012  . Acute bronchitis 12/26/2012  . Allergic rhinitis 12/26/2012  . Impacted cerumen 12/26/2012  . Epistaxis 12/26/2012  . Dizziness 12/26/2012  . Conjunctival hemorrhage 12/26/2012  . Asthma 12/26/2012  . Anxiety state 12/26/2012  . Viral infection 12/26/2012  . Syncope and collapse 12/26/2012  . Pneumonia 12/26/2012  . TIA (transient ischemic attack) 08/12/2012  . Precordial pain 08/12/2012  . Temporary cerebral vascular dysfunction 08/12/2012  . CAD (coronary artery disease) 06/27/2012  . Atherosclerotic heart disease of native coronary artery without angina pectoris 06/27/2012  . Migraine 06/26/2012  . Multiple-type hyperlipidemia 06/26/2012    Past Surgical History:  Procedure Laterality Date  . APPENDECTOMY  1970's  . CARDIAC CATHETERIZATION  06/2012  . CORONARY ARTERY BYPASS GRAFT  06/29/2012   Procedure: CORONARY ARTERY BYPASS GRAFTING (CABG);  Surgeon: Melrose Nakayama, MD;  Location: [facility];  Service: Open Heart Surgery;  Laterality: N/A;  times two using Left Internal Mammary Artery  . INGUINAL HERNIA REPAIR Bilateral [phone]  . LEFT HEART CATHETERIZATION WITH CORONARY ANGIOGRAM N/A 06/26/2012   Procedure:  LEFT HEART CATHETERIZATION WITH CORONARY ANGIOGRAM;  Surgeon: Josue Hector, MD;  Location: [facility] CATH LAB;  Service: Cardiovascular;  Laterality: N/A;  . LEFT HEART CATHETERIZATION WITH CORONARY/GRAFT ANGIOGRAM N/A 06/03/2014   Procedure: LEFT HEART CATHETERIZATION WITH Beatrix Fetters;  Surgeon: Jettie Booze, MD;  Location: [facility] CATH LAB;  Service: Cardiovascular;  Laterality: N/A;  . NASAL SEPTUM  SURGERY     For uncontrolled epistaxis early 2013  . SIGMOIDOSCOPY  10-15 years ago   in [place], [place]       Family History  Problem Relation Age of Onset  . Heart disease Brother        CABG age 71, redo bypass ~50  . Heart disease Paternal Grandmother   . Heart disease Cousin        Maternal side  . Heart attack Brother   . Colon cancer Maternal Grandfather 60       METS to colon from Lung CA per pt  . Lung cancer Maternal Grandfather   . Colon polyps Neg Hx   . Esophageal cancer Neg Hx   . Rectal cancer Neg Hx   . Stomach cancer Neg Hx     Social History   Tobacco Use  . Smoking status: Never Smoker  . Smokeless tobacco: Never Used  Substance Use Topics  . Alcohol use: Yes    Comment: occ-Social per pt  . Drug use: No    Home Medications Prior to Admission medications   Medication Sig Start Date End Date Taking? Authorizing Provider  aspirin EC 81 MG tablet Take 1 tablet (81 mg total) by mouth daily. 07/21/12  Yes Weaver, Scott T, PA-C  atorvastatin (LIPITOR) 40 MG tablet Take 1 tablet (40 mg total) by mouth at bedtime. 04/16/19  Yes Nafziger, Tommi Rumps, NP  citalopram (CELEXA) 10 MG tablet Take 1 tablet (10 mg total) by mouth daily. 06/10/19  Yes Nafziger, Tommi Rumps, NP  Coenzyme Q10 200 MG capsule Take 200 mg by mouth daily with breakfast.    Yes [provider]  diltiazem (CARDIZEM CD) 300 MG 24 hr capsule Take 300 mg by mouth daily. 03/16/19  Yes [provider]  diltiazem (CARDIZEM) 30 MG tablet Take 1 tablet (30 mg total) by mouth 4 (four) times daily as needed (elevated heart rates). 03/17/19  Yes [name], Will Hassell Done, MD  fluticasone (FLONASE) 50 MCG/ACT nasal spray Place 2 sprays into both nostrils daily. Patient taking differently: Place 2 sprays into both nostrils daily as needed for allergies or rhinitis.  05/26/18  Yes Nafziger, Tommi Rumps, NP  ibuprofen (ADVIL,MOTRIN) 200 MG tablet Take 600 mg by mouth every 6 (six) hours as needed (for migraines).     Yes [provider]  losartan (COZAAR) 50 MG tablet TAKE 1 TABLET(50 MG) BY MOUTH DAILY. Please keep upcoming appt in January with Dr. Johnsie Cancel before anymore refills. Thank you Patient taking differently: Take 50 mg by mouth at bedtime.  06/14/19  Yes Josue Hector, MD  nitroGLYCERIN (NITROSTAT) 0.4 MG SL tablet Place 1 tablet (0.4 mg total) under the tongue every 5 (five) minutes as needed for chest pain. 11/29/13  Yes Josue Hector, MD  omeprazole (PRILOSEC OTC) 20 MG tablet Take 20 mg by mouth daily as needed (acid reflux).    Yes [provider]    Allergies    Patient has no known allergies.  Review of Systems   Review of Systems  Constitutional: Negative for fever.  Respiratory: Positive for chest tightness. Negative for  cough and shortness of breath.   Cardiovascular: Negative for chest pain, palpitations and leg swelling.  Gastrointestinal: Negative for abdominal pain, nausea and vomiting.  Genitourinary: Negative for dysuria.  All other systems reviewed and are negative.   Physical Exam Updated Vital Signs BP 136/86   Pulse 66   Temp 97.9 F (36.6 C) (Oral)   Resp 15   Ht 1.803 m (5\' 11" )   Wt 88.5 kg   SpO2 99%   BMI 27.20 kg/m   Physical Exam Vitals and nursing note reviewed.  Constitutional:      Appearance: He is well-developed. He is not ill-appearing.  HENT:     Head: Normocephalic and atraumatic.  Eyes:     Pupils: Pupils are equal, round, and reactive to light.  Cardiovascular:     Rate and Rhythm: Normal rate and regular rhythm.     Heart sounds: Normal heart sounds. No murmur.  Pulmonary:     Effort: Pulmonary effort is normal. No respiratory distress.     Breath sounds: Normal breath sounds. No wheezing.  Abdominal:     General: Bowel sounds are normal.     Palpations: Abdomen is soft.     Tenderness: There is no abdominal tenderness. There is no rebound.  Musculoskeletal:     Cervical back: Neck supple.     Right lower leg:  No tenderness. No edema.     Left lower leg: No tenderness. No edema.  Lymphadenopathy:     Cervical: No cervical adenopathy.  Skin:    General: Skin is warm and dry.  Neurological:     Mental Status: He is alert and oriented to person, place, and time.  Psychiatric:        Mood and Affect: Mood normal.     ED Results / Procedures / Treatments   Labs (all labs ordered are listed, but only abnormal results are displayed) Labs Reviewed  BASIC METABOLIC PANEL - Abnormal; Notable for the following components:      Result Value   Glucose, Bld 106 (*)    All other components within normal limits  CBC  TSH  D-DIMER, QUANTITATIVE (NOT AT [facility])  TROPONIN I (HIGH SENSITIVITY)  TROPONIN I (HIGH SENSITIVITY)    EKG EKG Interpretation  Date/Time:  Saturday July 10 2019 02:10:28 EST Ventricular Rate:  94 PR Interval:    QRS Duration: 117 QT Interval:  358 QTC Calculation: 448 R Axis:   7 Text Interpretation: Sinus arrhythmia vs NSR with PACs Incomplete right bundle branch block Confirmed by Thayer Jew [phone]) on 07/10/2019 3:17:32 AM   Radiology DG Chest 2 View  Result Date: 07/10/2019 CLINICAL DATA:  Chest pain EXAM: CHEST - 2 VIEW COMPARISON:  04/08/2019 FINDINGS: There is elevation of the right hemidiaphragm. The heart size is stable. The patient is status post prior CABG. There is no pneumothorax. No large pleural effusion. There may be a minimal amount of atelectasis at the left lung base. IMPRESSION: No active cardiopulmonary disease. Electronically Signed   By: Constance Holster M.D.   On: 07/10/2019 03:01    Procedures Procedures (including critical care time)  Medications Ordered in ED Medications  sodium chloride flush (NS) 0.9 % injection 3 mL (3 mLs Intravenous Not Given 07/10/19 0217)    ED Course  I have reviewed the triage vital signs and the nursing notes.  Pertinent labs & imaging results that were available during my care of the patient were  reviewed by me and considered in my medical  decision making (see chart for details).    MDM Rules/Calculators/A&P                      Patient presents with chest discomfort on and off for the last week.  He is overall nontoxic and vital signs are reassuring.  EMS reported some tachycardia in route.  He is currently within irregular sinus rhythm.  Likely PACs.  He was monitored closely without any evidence of tachyarrhythmias on monitoring.  There is no evidence of ischemia on his EKG.  Troponins x2 -.  TSH is within normal range.  D-dimer is negative ruling out blood clot.  As he is otherwise low risk.  He has no significant metabolic derangements.  On recheck, he is without pain but states "I just do not feel right."  Given current pandemic, would not be unreasonable to test for COVID-19 although he reports low risk of exposure.  We will send this testing and have him isolate until results return.  In the meantime, we will have him follow-up with cardiology.  After history, exam, and medical workup I feel the patient has been appropriately medically screened and is safe for discharge home. Pertinent diagnoses were discussed with the patient. Patient was given return precautions.  Final Clinical Impression(s) / ED Diagnoses Final diagnoses:  Atypical chest pain    Rx / DC Orders ED Discharge Orders    None       Deangelo Berns, Barbette Hair, MD 07/10/19 [phone]

## 2019-07-13 ENCOUNTER — Telehealth: Payer: Self-pay | Admitting: *Deleted

## 2019-07-13 NOTE — Telephone Encounter (Signed)
Followed up w/ Terry Hansen per request of Dr. Curt Bears after recent ED visit. Terry Hansen reports that he is feeling better. States he feels that issue was more related to anxiety. He reports being "uptight about stuff with christmas and meeting deadlines", and anxiety is not new for him. Offered appt per Dr. Curt Bears but Terry Hansen declines at this time. Terry Hansen voiced that he has his yearly follow up with Dr. Johnsie Cancel on 08/02/19. Terry Hansen advised to call office if he feels he needs sooner follow up.  Aware I will forward to Dr. Curt Bears and will only call him back if [name] has other advisement. Patient verbalized understanding and agreeable to plan.

## 2019-07-18 ENCOUNTER — Other Ambulatory Visit: Payer: Self-pay | Admitting: Adult Health

## 2019-07-18 ENCOUNTER — Other Ambulatory Visit: Payer: Self-pay | Admitting: Cardiology

## 2019-07-19 ENCOUNTER — Other Ambulatory Visit: Payer: Self-pay

## 2019-07-19 ENCOUNTER — Inpatient Hospital Stay (HOSPITAL_COMMUNITY)
Admission: EM | Admit: 2019-07-19 | Discharge: 2019-07-22 | DRG: 310 | Disposition: A | Discharge status: Home / Self Care | Payer: Medicare Other | Attending: Internal Medicine | Admitting: Internal Medicine

## 2019-07-19 ENCOUNTER — Encounter (HOSPITAL_COMMUNITY): Payer: Self-pay

## 2019-07-19 DIAGNOSIS — Z8249 Family history of ischemic heart disease and other diseases of the circulatory system: Secondary | ICD-10-CM | POA: Diagnosis not present

## 2019-07-19 DIAGNOSIS — Z7982 Long term (current) use of aspirin: Secondary | ICD-10-CM | POA: Diagnosis not present

## 2019-07-19 DIAGNOSIS — R002 Palpitations: Secondary | ICD-10-CM | POA: Diagnosis present

## 2019-07-19 DIAGNOSIS — Z20828 Contact with and (suspected) exposure to other viral communicable diseases: Secondary | ICD-10-CM | POA: Diagnosis present

## 2019-07-19 DIAGNOSIS — E876 Hypokalemia: Secondary | ICD-10-CM | POA: Diagnosis present

## 2019-07-19 DIAGNOSIS — G43109 Migraine with aura, not intractable, without status migrainosus: Secondary | ICD-10-CM | POA: Diagnosis present

## 2019-07-19 DIAGNOSIS — I499 Cardiac arrhythmia, unspecified: Secondary | ICD-10-CM | POA: Diagnosis not present

## 2019-07-19 DIAGNOSIS — R001 Bradycardia, unspecified: Secondary | ICD-10-CM | POA: Diagnosis not present

## 2019-07-19 DIAGNOSIS — Z79899 Other long term (current) drug therapy: Secondary | ICD-10-CM

## 2019-07-19 DIAGNOSIS — I2510 Atherosclerotic heart disease of native coronary artery without angina pectoris: Secondary | ICD-10-CM | POA: Diagnosis present

## 2019-07-19 DIAGNOSIS — F419 Anxiety disorder, unspecified: Secondary | ICD-10-CM | POA: Diagnosis present

## 2019-07-19 DIAGNOSIS — Z951 Presence of aortocoronary bypass graft: Secondary | ICD-10-CM

## 2019-07-19 DIAGNOSIS — K219 Gastro-esophageal reflux disease without esophagitis: Secondary | ICD-10-CM | POA: Diagnosis present

## 2019-07-19 DIAGNOSIS — E785 Hyperlipidemia, unspecified: Secondary | ICD-10-CM | POA: Diagnosis present

## 2019-07-19 DIAGNOSIS — Z800 Family history of malignant neoplasm of digestive organs: Secondary | ICD-10-CM | POA: Diagnosis not present

## 2019-07-19 DIAGNOSIS — I10 Essential (primary) hypertension: Secondary | ICD-10-CM | POA: Diagnosis present

## 2019-07-19 DIAGNOSIS — I471 Supraventricular tachycardia: Principal | ICD-10-CM

## 2019-07-19 DIAGNOSIS — Z801 Family history of malignant neoplasm of trachea, bronchus and lung: Secondary | ICD-10-CM | POA: Diagnosis not present

## 2019-07-19 DIAGNOSIS — I4719 Other supraventricular tachycardia: Secondary | ICD-10-CM | POA: Diagnosis present

## 2019-07-19 LAB — BASIC METABOLIC PANEL
Anion gap: 12 (ref 5–15)
BUN: 14 mg/dL (ref 8–23)
CO2: 24 mmol/L (ref 22–32)
Calcium: 9.3 mg/dL (ref 8.9–10.3)
Chloride: 103 mmol/L (ref 98–111)
Creatinine, Ser: 1.02 mg/dL (ref 0.61–1.24)
GFR calc Af Amer: >60 mL/min (ref >60)
GFR calc non Af Amer: >60 mL/min (ref >60)
Glucose, Bld: 116 mg/dL — ABNORMAL HIGH (ref 70–99)
Potassium: 3.4 mmol/L — ABNORMAL LOW (ref 3.5–5.1)
Sodium: 139 mmol/L (ref 135–145)

## 2019-07-19 LAB — CBC
HCT: 48.8 % (ref 39.0–52.0)
Hemoglobin: 16.7 g/dL (ref 13.0–17.0)
MCH: 30 pg (ref 26.0–34.0)
MCHC: 34.2 g/dL (ref 30.0–36.0)
MCV: 87.8 fL (ref 80.0–100.0)
Platelets: 291 10*3/uL (ref 150–400)
RBC: 5.56 MIL/uL (ref 4.22–5.81)
RDW: 12.4 % (ref 11.5–15.5)
WBC: 10.6 10*3/uL — ABNORMAL HIGH (ref 4.0–10.5)
nRBC: 0 % (ref 0.0–0.2)

## 2019-07-19 LAB — URINALYSIS, ROUTINE W REFLEX MICROSCOPIC
Bilirubin Urine: NEGATIVE
Glucose, UA: NEGATIVE mg/dL
Hgb urine dipstick: NEGATIVE
Ketones, ur: 20 mg/dL — AB
Leukocytes,Ua: NEGATIVE
Nitrite: NEGATIVE
Protein, ur: NEGATIVE mg/dL
Specific Gravity, Urine: 1.009 (ref 1.005–1.030)
pH: 6 (ref 5.0–8.0)

## 2019-07-19 LAB — CBG MONITORING, ED: Glucose-Capillary: 122 mg/dL — ABNORMAL HIGH (ref 70–99)

## 2019-07-19 LAB — TROPONIN I (HIGH SENSITIVITY)
Troponin I (High Sensitivity): 4 ng/L (ref <18)
Troponin I (High Sensitivity): 4 ng/L (ref <18)

## 2019-07-19 LAB — MAGNESIUM: Magnesium: 2 mg/dL (ref 1.7–2.4)

## 2019-07-19 MED ORDER — FLUTICASONE PROPIONATE 50 MCG/ACT NA SUSP
2.0000 | Freq: Every day | NASAL | Status: DC | PRN
  Filled 2019-07-19: qty 16

## 2019-07-19 MED ORDER — NITROGLYCERIN 0.4 MG SL SUBL: 0.4000 mg | SUBLINGUAL_TABLET | SUBLINGUAL | Status: DC | PRN

## 2019-07-19 MED ORDER — ASPIRIN EC 81 MG PO TBEC
81.0000 mg | DELAYED_RELEASE_TABLET | Freq: Every day | ORAL | Status: DC
  Administered 2019-07-20 – 2019-07-22 (×3): 81 mg via ORAL
  Filled 2019-07-19 (×3): qty 1

## 2019-07-19 MED ORDER — ENOXAPARIN SODIUM 40 MG/0.4ML ~~LOC~~ SOLN
40.0000 mg | SUBCUTANEOUS | Status: DC
  Administered 2019-07-19 – 2019-07-20 (×2): 40 mg via SUBCUTANEOUS
  Filled 2019-07-19 (×3): qty 0.4

## 2019-07-19 MED ORDER — PANTOPRAZOLE SODIUM 40 MG PO TBEC: 40.0000 mg | DELAYED_RELEASE_TABLET | Freq: Every day | ORAL | Status: DC | PRN

## 2019-07-19 MED ORDER — CITALOPRAM HYDROBROMIDE 10 MG PO TABS
10.0000 mg | ORAL_TABLET | Freq: Every day | ORAL | Status: DC
  Administered 2019-07-20 – 2019-07-22 (×3): 10 mg via ORAL
  Filled 2019-07-19 (×3): qty 1

## 2019-07-19 MED ORDER — ONDANSETRON HCL 4 MG/2ML IJ SOLN: 4.0000 mg | Freq: Four times a day (QID) | INTRAMUSCULAR | Status: DC | PRN

## 2019-07-19 MED ORDER — POTASSIUM CHLORIDE CRYS ER 20 MEQ PO TBCR
60.0000 meq | EXTENDED_RELEASE_TABLET | Freq: Once | ORAL | Status: AC
  Administered 2019-07-19: 60 meq via ORAL
  Filled 2019-07-19: qty 3

## 2019-07-19 MED ORDER — POTASSIUM CHLORIDE CRYS ER 20 MEQ PO TBCR: 40.0000 meq | EXTENDED_RELEASE_TABLET | Freq: Once | ORAL | Status: DC

## 2019-07-19 MED ORDER — LOSARTAN POTASSIUM 50 MG PO TABS
50.0000 mg | ORAL_TABLET | Freq: Every day | ORAL | Status: DC
  Administered 2019-07-19 – 2019-07-21 (×3): 50 mg via ORAL
  Filled 2019-07-19 (×3): qty 1

## 2019-07-19 MED ORDER — ACETAMINOPHEN 325 MG PO TABS: 650.0000 mg | ORAL_TABLET | ORAL | Status: DC | PRN

## 2019-07-19 MED ORDER — ATORVASTATIN CALCIUM 40 MG PO TABS
40.0000 mg | ORAL_TABLET | Freq: Every day | ORAL | Status: DC
  Administered 2019-07-19 – 2019-07-21 (×3): 40 mg via ORAL
  Filled 2019-07-19 (×3): qty 1

## 2019-07-19 MED ORDER — DILTIAZEM HCL ER COATED BEADS 180 MG PO CP24
300.0000 mg | ORAL_CAPSULE | Freq: Every day | ORAL | Status: DC
  Administered 2019-07-20 – 2019-07-21 (×2): 300 mg via ORAL
  Filled 2019-07-19 (×2): qty 1

## 2019-07-19 MED ORDER — COENZYME Q10 200 MG PO CAPS: 200.0000 mg | ORAL_CAPSULE | Freq: Every day | ORAL | Status: DC

## 2019-07-19 NOTE — ED Notes (Signed)
Got patient on the monitor into a gown patient is resting with call bell in reach  

## 2019-07-19 NOTE — ED Triage Notes (Signed)
Pt reports one week of generalized fatigue and dizziness that has gotten worse. Pt seen here last week and was discharged. Pt denies chest pain but states he is "winded, not SOB". HR ranging from 30-74 in triage. Pt a.o

## 2019-07-19 NOTE — ED Provider Notes (Signed)
[place] EMERGENCY DEPARTMENT Provider Note   CSN: [id number] Arrival date & time: 07/19/19  1147     History Chief Complaint  Patient presents with  . Fatigue  . Dizziness    Terry Hansen is a 63 y.o. male.  HPI Patient presents to the emergency department with general weakness and dizziness along with an irregular heartbeat.  Patient states he was seen here last Monday with similar symptoms and was sent home with follow-up with his cardiologist.  Patient states that he started feeling symptoms again on Saturday.  Patient states that lasted about 5 hours.  Today he woke with symptoms and has persisted throughout the day.  Patient states that he has a pressure sensation in the center of his chest during these episodes.  The patient denies chest pain, shortness of breath, headache,blurred vision, neck pain, fever, cough,, numbness, anorexia, edema, abdominal pain, nausea, vomiting, diarrhea, rash, back pain, dysuria, hematemesis, bloody stool, near syncope, or syncope.    Past Medical History:  Diagnosis Date  . CAD (coronary artery disease)    a.  LHC 06/26/12:  pLAD 90%, prox/mid/dist CFS 20-30%, oRCA 100% (non dom), EF 55%;  b. Echo 12/13:  EF 55-60%, Gr 1 diast dysfn;  c. s/p CABG [facility]ox[facility] Hockey) 12/13: L-LAD/Dx    . GERD (gastroesophageal reflux disease)   . HLD (hyperlipidemia)   . Hypertension   . Ocular migraine    "maybe once q couple months" (06/02/2014)  . Unstable angina pectoris [facility])     Patient Active Problem List   Diagnosis Date Noted  . Chest pain 04/08/2019  . Ocular migraine   . HLD (hyperlipidemia)   . Atopic dermatitis 01/27/2017  . PAT (paroxysmal atrial tachycardia) ([place]) 10/22/2015  . Palpitations 10/19/2015  . Chronotropic incompetence with sinus node dysfunction (HCC) 10/19/2015  . Anxiety and depression 10/24/2014  . Unstable angina pectoris ([place]) 06/27/2014  . Chest pain, mid sternal 06/02/2014  . Routine history and  physical examination of adult 01/31/2014  . Gastroesophageal reflux disease 01/31/2014  . HTN (hypertension) 03/01/2013  . Eustachian tube dysfunction 02/24/2013  . Labyrinthitis 02/24/2013  . Hypertension, benign 12/26/2012  . Acute bronchitis 12/26/2012  . Allergic rhinitis 12/26/2012  . Impacted cerumen 12/26/2012  . Epistaxis 12/26/2012  . Dizziness 12/26/2012  . Conjunctival hemorrhage 12/26/2012  . Asthma 12/26/2012  . Anxiety state 12/26/2012  . Viral infection 12/26/2012  . Syncope and collapse 12/26/2012  . Pneumonia 12/26/2012  . TIA (transient ischemic attack) 08/12/2012  . Precordial pain 08/12/2012  . Temporary cerebral vascular dysfunction 08/12/2012  . CAD (coronary artery disease) 06/27/2012  . Atherosclerotic heart disease of native coronary artery without angina pectoris 06/27/2012  . Migraine 06/26/2012  . Multiple-type hyperlipidemia 06/26/2012    Past Surgical History:  Procedure Laterality Date  . APPENDECTOMY  1970's  . CARDIAC CATHETERIZATION  06/2012  . CORONARY ARTERY BYPASS GRAFT  06/29/2012   Procedure: CORONARY ARTERY BYPASS GRAFTING (CABG);  Surgeon: Melrose Nakayama, MD;  Location: [facility];  Service: Open Heart Surgery;  Laterality: N/A;  times two using Left Internal Mammary Artery  . INGUINAL HERNIA REPAIR Bilateral [phone]  . LEFT HEART CATHETERIZATION WITH CORONARY ANGIOGRAM N/A 06/26/2012   Procedure: LEFT HEART CATHETERIZATION WITH CORONARY ANGIOGRAM;  Surgeon: Josue Hector, MD;  Location: [facility] CATH LAB;  Service: Cardiovascular;  Laterality: N/A;  . LEFT HEART CATHETERIZATION WITH CORONARY/GRAFT ANGIOGRAM N/A 06/03/2014   Procedure: LEFT HEART CATHETERIZATION WITH Beatrix Fetters;  Surgeon: Jettie Booze, MD;  Location: [place] CATH LAB;  Service: Cardiovascular;  Laterality: N/A;  . NASAL SEPTUM SURGERY     For uncontrolled epistaxis early 2013  . SIGMOIDOSCOPY  10-15 years ago   in [place] [place], [place]       Family History    Problem Relation Age of Onset  . Heart disease Brother        CABG age 5, redo bypass ~50  . Heart disease Paternal Grandmother   . Heart disease Cousin        Maternal side  . Heart attack Brother   . Colon cancer Maternal Grandfather 60       METS to colon from Lung CA per pt  . Lung cancer Maternal Grandfather   . Colon polyps Neg Hx   . Esophageal cancer Neg Hx   . Rectal cancer Neg Hx   . Stomach cancer Neg Hx     Social History   Tobacco Use  . Smoking status: Never Smoker  . Smokeless tobacco: Never Used  Substance Use Topics  . Alcohol use: Yes    Comment: occ-Social per pt  . Drug use: No    Home Medications Prior to Admission medications   Medication Sig Start Date End Date Taking? Authorizing Provider  aspirin EC 81 MG tablet Take 1 tablet (81 mg total) by mouth daily. 07/21/12   Richardson Dopp T, PA-C  atorvastatin (LIPITOR) 40 MG tablet Take 1 tablet (40 mg total) by mouth at bedtime. 04/16/19   Nafziger, Tommi Rumps, NP  citalopram (CELEXA) 10 MG tablet Take 1 tablet (10 mg total) by mouth daily. 06/10/19   Nafziger, Tommi Rumps, NP  Coenzyme Q10 200 MG capsule Take 200 mg by mouth daily with breakfast.     [provider]  diltiazem (CARDIZEM CD) 300 MG 24 hr capsule Take 300 mg by mouth daily. 03/16/19   [provider]  diltiazem (CARDIZEM) 30 MG tablet Take 1 tablet (30 mg total) by mouth 4 (four) times daily as needed (elevated heart rates). 03/17/19   [name], Will Hassell Done, MD  fluticasone (FLONASE) 50 MCG/ACT nasal spray Place 2 sprays into both nostrils daily. Patient taking differently: Place 2 sprays into both nostrils daily as needed for allergies or rhinitis.  05/26/18   Nafziger, Tommi Rumps, NP  ibuprofen (ADVIL,MOTRIN) 200 MG tablet Take 600 mg by mouth every 6 (six) hours as needed (for migraines).     [provider]  losartan (COZAAR) 50 MG tablet TAKE 1 TABLET(50 MG) BY MOUTH DAILY. Please keep upcoming appt in January with Dr. Johnsie Cancel before  anymore refills. Thank you Patient taking differently: Take 50 mg by mouth at bedtime.  06/14/19   Josue Hector, MD  nitroGLYCERIN (NITROSTAT) 0.4 MG SL tablet Place 1 tablet (0.4 mg total) under the tongue every 5 (five) minutes as needed for chest pain. 11/29/13   Josue Hector, MD  omeprazole (PRILOSEC OTC) 20 MG tablet Take 20 mg by mouth daily as needed (acid reflux).     [provider]    Allergies    Patient has no known allergies.  Review of Systems   Review of Systems All other systems negative except as documented in the HPI. All pertinent positives and negatives as reviewed in the HPI. Physical Exam Updated Vital Signs BP 130/82   Pulse 81   Temp 97.7 F (36.5 C) (Oral)   Resp 18   Ht 5\' 11"  (1.803 m)   Wt 88.5 kg   SpO2 97%  BMI 27.20 kg/m   Physical Exam Vitals and nursing note reviewed.  Constitutional:      General: He is not in acute distress.    Appearance: He is well-developed.  HENT:     Head: Normocephalic and atraumatic.  Eyes:     Pupils: Pupils are equal, round, and reactive to light.  Cardiovascular:     Rate and Rhythm: Normal rate. Rhythm irregular.     Heart sounds: Normal heart sounds. No murmur. No friction rub. No gallop.   Pulmonary:     Effort: Pulmonary effort is normal. No respiratory distress.     Breath sounds: Normal breath sounds. No wheezing.  Abdominal:     General: Bowel sounds are normal. There is no distension.     Palpations: Abdomen is soft.     Tenderness: There is no abdominal tenderness.  Musculoskeletal:     Cervical back: Normal range of motion and neck supple.  Skin:    General: Skin is warm and dry.     Capillary Refill: Capillary refill takes less than 2 seconds.     Findings: No erythema or rash.  Neurological:     Mental Status: He is alert and oriented to person, place, and time.     Motor: No abnormal muscle tone.     Coordination: Coordination normal.  Psychiatric:        Behavior: Behavior  normal.     ED Results / Procedures / Treatments   Labs (all labs ordered are listed, but only abnormal results are displayed) Labs Reviewed  BASIC METABOLIC PANEL - Abnormal; Notable for the following components:      Result Value   Potassium 3.4 (*)    Glucose, Bld 116 (*)    All other components within normal limits  CBC - Abnormal; Notable for the following components:   WBC 10.6 (*)    All other components within normal limits  CBG MONITORING, ED - Abnormal; Notable for the following components:   Glucose-Capillary 122 (*)    All other components within normal limits  URINALYSIS, ROUTINE W REFLEX MICROSCOPIC  TROPONIN I (HIGH SENSITIVITY)    EKG None  Radiology No results found.  Procedures Procedures (including critical care time)  Medications Ordered in ED Medications - No data to display  ED Course  I have reviewed the triage vital signs and the nursing notes.  Pertinent labs & imaging results that were available during my care of the patient were reviewed by me and considered in my medical decision making (see chart for details).    MDM Rules/Calculators/A&P                     Spoke with cardiology who will come and evaluate the patient as it looks like he does have these runs of atrial tachycardia and would listen to his heart you have any normal beats followed by states several rapid heart beats.  This is consistent with what we are seeing on the EKG. Final Clinical Impression(s) / ED Diagnoses Final diagnoses:  None    Rx / DC Orders ED Discharge Orders    None       Dalia Heading, PA-C 07/19/19 1543    Davonna Belling, MD 07/20/19 1055

## 2019-07-19 NOTE — H&P (Addendum)
ELECTROPHYSIOLOGY CONSULT NOTE    Patient ID: Terry Hansen MRN: [id number], DOB/AGE: [birth year] 65 y.o.  Admit date: 07/19/2019 Date of Consult: 07/19/2019  Primary Physician: [name], NP Primary Cardiologist: Jenkins Rouge, MD  Electrophysiologist: Dr. Curt Bears   Referring Provider: Dr. Alvino Chapel  Patient Profile: Terry Hansen is a 63 y.o. male with a history of atrial tachycardia, palpitations, HTN, and CAD s/p CABG who is being seen today for the evaluation of palpitations/atrial tachycardia at the request of Dr. Alvino Chapel.  HPI:  Terry Hansen is a 63 y.o. male with history above. Previously seen by Dr. Curt Bears 02/2019 and switched from flecainide to [facility] with h/o CAD.  He called triage over the weekend with palpitations, lightheadedness and SOB. They were waxing and waning so he was initially scheduled for evaluation in the office 07/20/2019. He was encouraged to take an extra dose of diltiazem 30 mg as needed. His symptoms persisted and he did not feel like he could wait any longer, so presented to [facility]. He had a similar episode last week, ED work up was unremarkable.  That event assoc with CPressure, the ones today not so  Pertinent labs on arrival show Cr 1.02, K 3.4, CBC stable, HS-trop negative.  EKG shows NSR with paroxsymal atrial tachycardia. Medications at home included diltiazem 300 mg daily with extra 30 mg as needed up to 4 times a day. (Didn't tolerate metoprolol due to fatigue)   He is having runs of atrial tachycardia with palpitations currently. His chest pressure has waxed and waned. He is not short of breath at rest.  Past Medical History:  Diagnosis Date  . CAD (coronary artery disease)    a.  LHC 06/26/12:  pLAD 90%, prox/mid/dist CFS 20-30%, oRCA 100% (non dom), EF 55%;  b. Echo 12/13:  EF 55-60%, Gr 1 diast dysfn;  c. s/p CABG [facility]) 12/13: L-LAD/Dx    . GERD (gastroesophageal reflux disease)   . HLD (hyperlipidemia)   . Hypertension   .  Ocular migraine    "maybe once q couple months" (06/02/2014)  . Unstable angina pectoris [facility])      Surgical History:  Past Surgical History:  Procedure Laterality Date  . APPENDECTOMY  1970's  . CARDIAC CATHETERIZATION  06/2012  . CORONARY ARTERY BYPASS GRAFT  06/29/2012   Procedure: CORONARY ARTERY BYPASS GRAFTING (CABG);  Surgeon: Melrose Nakayama, MD;  Location: [place];  Service: Open Heart Surgery;  Laterality: N/A;  times two using Left Internal Mammary Artery  . INGUINAL HERNIA REPAIR Bilateral [phone]  . LEFT HEART CATHETERIZATION WITH CORONARY ANGIOGRAM N/A 06/26/2012   Procedure: LEFT HEART CATHETERIZATION WITH CORONARY ANGIOGRAM;  Surgeon: Josue Hector, MD;  Location: [facility] CATH LAB;  Service: Cardiovascular;  Laterality: N/A;  . LEFT HEART CATHETERIZATION WITH CORONARY/GRAFT ANGIOGRAM N/A 06/03/2014   Procedure: LEFT HEART CATHETERIZATION WITH Beatrix Fetters;  Surgeon: Jettie Booze, MD;  Location: [facility] CATH LAB;  Service: Cardiovascular;  Laterality: N/A;  . NASAL SEPTUM SURGERY     For uncontrolled epistaxis early 2013  . SIGMOIDOSCOPY  10-15 years ago   in [facility], [place]     (Not in a hospital admission)    Inpatient Medications:   Allergies: No Known Allergies  Social History   Socioeconomic History  . Marital status: Divorced    Spouse name: Not on file  . Number of children: Not on file  . Years of education: Not on file  . Highest education level: Not on file  Occupational History  .  Occupation: retired  Tobacco Use  . Smoking status: Never Smoker  . Smokeless tobacco: Never Used  Substance and Sexual Activity  . Alcohol use: Yes    Comment: occ-Social per pt  . Drug use: No  . Sexual activity: Not Currently  Other Topics Concern  . Not on file  Social History Narrative   Divorced 7 years ago   3 children   Works in Aeronautical engineer   Social Determinants of Radio broadcast assistant Strain:   . Difficulty of Paying  Living Expenses: Not on file  Food Insecurity:   . Worried About Charity fundraiser in the Last Year: Not on file  . Ran Out of Food in the Last Year: Not on file  Transportation Needs:   . Lack of Transportation (Medical): Not on file  . Lack of Transportation (Non-Medical): Not on file  Physical Activity:   . Days of Exercise per Week: Not on file  . Minutes of Exercise per Session: Not on file  Stress:   . Feeling of Stress : Not on file  Social Connections:   . Frequency of Communication with Friends and Family: Not on file  . Frequency of Social Gatherings with Friends and Family: Not on file  . Attends Religious Services: Not on file  . Active Member of Clubs or Organizations: Not on file  . Attends Archivist Meetings: Not on file  . Marital Status: Not on file  Intimate Partner Violence:   . Fear of Current or Ex-Partner: Not on file  . Emotionally Abused: Not on file  . Physically Abused: Not on file  . Sexually Abused: Not on file     Family History  Problem Relation Age of Onset  . Heart disease Brother        CABG age 28, redo bypass ~50  . Heart disease Paternal Grandmother   . Heart disease Cousin        Maternal side  . Heart attack Brother   . Colon cancer Maternal Grandfather 60       METS to colon from Lung CA per pt  . Lung cancer Maternal Grandfather   . Colon polyps Neg Hx   . Esophageal cancer Neg Hx   . Rectal cancer Neg Hx   . Stomach cancer Neg Hx      Review of Systems: All other systems reviewed and are otherwise negative except as noted above.  Physical Exam: Vitals:   07/19/19 1430 07/19/19 1445 07/19/19 1500 07/19/19 1515  BP: 140/89 132/83 134/89 123/79  Pulse: 66 (!) 25 95 64  Resp: 16 17 15 15   Temp:      TempSrc:      SpO2: 98% 98% 98% 97%  Weight:      Height:        GEN- The patient is well appearing, alert and oriented x 3 today.   HEENT: normocephalic, atraumatic; sclera clear, conjunctiva pink; hearing  intact; oropharynx clear; neck supple Lungs- Clear to ausculation bilaterally, normal work of breathing.  No wheezes, rales, rhonchi Heart- Irregular rate and rhythm, no murmurs, rubs or gallops GI- soft, non-tender, non-distended, bowel sounds present Extremities- no clubbing, cyanosis, or edema; DP/PT/radial pulses 2+ bilaterally MS- no significant deformity or atrophy Skin- warm and dry, no rash or lesion Psych- euthymic mood, full affect Neuro- strength and sensation are intact  Labs:   Lab Results  Component Value Date   WBC 10.6 (H) 07/19/2019   HGB  16.7 07/19/2019   HCT 48.8 07/19/2019   MCV 87.8 07/19/2019   PLT 291 07/19/2019    Recent Labs  Lab 07/19/19 1220  NA 139  K 3.4*  CL 103  CO2 24  BUN 14  CREATININE 1.02  CALCIUM 9.3  GLUCOSE 116*      Radiology/Studies: DG Chest 2 View  Result Date: 07/10/2019 CLINICAL DATA:  Chest pain EXAM: CHEST - 2 VIEW COMPARISON:  04/08/2019 FINDINGS: There is elevation of the right hemidiaphragm. The heart size is stable. The patient is status post prior CABG. There is no pneumothorax. No large pleural effusion. There may be a minimal amount of atelectasis at the left lung base. IMPRESSION: No active cardiopulmonary disease. Electronically Signed   By: Constance Holster M.D.   On: 07/10/2019 03:01     EKG: NSR with frequent short runs of SVT, underling sinus rate ~60-70 bpm (personally reviewed)  TELEMETRY: NSR with intermittent atrial tachycardia as high as 120-130s (personally reviewed)  Assessment/Plan: 1.  Palpitations/Atrial tachycardia On diltiazem 300 mg daily with extra 30 mg as needed Echo 04/09/2019 LVEF 60-65%, LA diam 4.0 cm (1.92 cm/m2), LA vol 43.0 ml (20.69 ml/m2) TSH normal 07/10/2019. Previously taken off of flecainide with h/o CAD/CABG.  Had discussed Multaq, but has been cost prohibitive. Could consider tikosyn/Sotalol depending on affordability. Would prefer to avoid long term amiodarone, but could use  as a bridge to discuss ablation. We will look at cost of Tikosyn vs Sotalol, and plan med initiation tomorrow am.   2. Hypokalemia  K 3.4 on arrival. Will supp and follow.   3. CAD s/p CABG "7 years ago) Denies ischemic symptoms. HS-Trop negative, and last ED check.  For questions or updates, please contact [place] Please consult [email or web address] for contact info under Cardiology/STEMI.  Signed, Shirley Friar, PA-C  07/19/2019 3:57 PM  Atrial tachycardia  Coronary artery disease with prior bypass  Hypokalemia  Hypertension  Anxiety  H and P as noted above , x as addended   Patient has recurrent symptomatic atrial tachycardia.  May be left atrial in origin with a positive P waves in lead V1   lengthy discussion regarding options and the role of catheter ablation.  With coronary disease he is not a candidate for a 1C; QTC adequate for a class III.  He is reluctant to take dronaderone because of cost and amiodarone because his brother did not tolerate it.  Hence, he prefers hospitalization for the initiation of a class III as opposed to amiodarone and a bridge to ablation.  We will look into the cost on his new health insurance for dofetilide versus sotalol and make a decision in the morning following repletion of potassium  We have reviewed the potential for proarrhythmia   .

## 2019-07-19 NOTE — Consult Note (Deleted)
ELECTROPHYSIOLOGY CONSULT NOTE    Patient ID: Terry Hansen MRN: [id number], DOB/AGE: 65-07-1955 65 y.o.  Admit date: 07/19/2019 Date of Consult: 07/19/2019  Primary Physician: [name], NP Primary Cardiologist: Jenkins Rouge, MD  Electrophysiologist: Dr. Curt Bears   Referring Provider: Dr. Alvino Chapel  Patient Profile: Terry Hansen is a 63 y.o. male with a history of atrial tachycardia, palpitations, HTN, and CAD s/p CABG who is being seen today for the evaluation of palpitations/atrial tachycardia at the request of Dr. Alvino Chapel.  HPI:  Terry Hansen is a 63 y.o. male with history above. Previously seen by Dr. Curt Bears 02/2019 and switched from flecainide to [facility] with h/o CAD.  He called triage over the weekend with palpitations, lightheadedness and SOB. They were waxing and waning so he was initially scheduled for evaluation in the office 07/20/2019. He was encouraged to take an extra dose of diltiazem 30 mg as needed. His symptoms persisted and he did not feel like he could wait any longer, so presented to [facility]. He had a similar episode last week, ED work up was unremarkable.   Pertinent labs on arrival show Cr 1.02, K 3.4, CBC stable, HS-trop negative.  EKG shows NSR with paroxsymal atrial tachycardia. Medications at home included diltiazem 300 mg daily with extra 30 mg as needed up to 4 times a day. (Didn't tolerate metoprolol due to fatigue)   He is having runs of atrial tachycardia with palpitations currently. His chest pressure has waxed and waned. He is not short of breath at rest.  Past Medical History:  Diagnosis Date  . CAD (coronary artery disease)    a.  LHC 06/26/12:  pLAD 90%, prox/mid/dist CFS 20-30%, oRCA 100% (non dom), EF 55%;  b. Echo 12/13:  EF 55-60%, Gr 1 diast dysfn;  c. s/p CABG [facility]) 12/13: L-LAD/Dx    . GERD (gastroesophageal reflux disease)   . HLD (hyperlipidemia)   . Hypertension   . Ocular migraine    "maybe once q couple months"  (06/02/2014)  . Unstable angina pectoris [facility])      Surgical History:  Past Surgical History:  Procedure Laterality Date  . APPENDECTOMY  1970's  . CARDIAC CATHETERIZATION  06/2012  . CORONARY ARTERY BYPASS GRAFT  06/29/2012   Procedure: CORONARY ARTERY BYPASS GRAFTING (CABG);  Surgeon: Melrose Nakayama, MD;  Location: [place];  Service: Open Heart Surgery;  Laterality: N/A;  times two using Left Internal Mammary Artery  . INGUINAL HERNIA REPAIR Bilateral [phone]  . LEFT HEART CATHETERIZATION WITH CORONARY ANGIOGRAM N/A 06/26/2012   Procedure: LEFT HEART CATHETERIZATION WITH CORONARY ANGIOGRAM;  Surgeon: Josue Hector, MD;  Location[facility] CATH LAB;  Service: Cardiovascular;  Laterality: N/A;  . LEFT HEART CATHETERIZATION WITH CORONARY/GRAFT ANGIOGRAM N/A 06/03/2014   Procedure: LEFT HEART CATHETERIZATION WITH Beatrix Fetters;  Surgeon: Jettie Booze, MD;  Location: [facility] CATH LAB;  Service: Cardiovascular;  Laterality: N/A;  . NASAL SEPTUM SURGERY     For uncontrolled epistaxis early 2013  . SIGMOIDOSCOPY  10-15 years ago   in [facility], [place]     (Not in a hospital admission)   Inpatient Medications:   Allergies: No Known Allergies  Social History   Socioeconomic History  . Marital status: Divorced    Spouse name: Not on file  . Number of children: Not on file  . Years of education: Not on file  . Highest education level: Not on file  Occupational History  . Occupation: retired  Tobacco Use  . Smoking status:  Never Smoker  . Smokeless tobacco: Never Used  Substance and Sexual Activity  . Alcohol use: Yes    Comment: occ-Social per pt  . Drug use: No  . Sexual activity: Not Currently  Other Topics Concern  . Not on file  Social History Narrative   Divorced 7 years ago   3 children   Works in Aeronautical engineer   Social Determinants of Radio broadcast assistant Strain:   . Difficulty of Paying Living Expenses: Not on file  Food Insecurity:     . Worried About Charity fundraiser in the Last Year: Not on file  . Ran Out of Food in the Last Year: Not on file  Transportation Needs:   . Lack of Transportation (Medical): Not on file  . Lack of Transportation (Non-Medical): Not on file  Physical Activity:   . Days of Exercise per Week: Not on file  . Minutes of Exercise per Session: Not on file  Stress:   . Feeling of Stress : Not on file  Social Connections:   . Frequency of Communication with Friends and Family: Not on file  . Frequency of Social Gatherings with Friends and Family: Not on file  . Attends Religious Services: Not on file  . Active Member of Clubs or Organizations: Not on file  . Attends Archivist Meetings: Not on file  . Marital Status: Not on file  Intimate Partner Violence:   . Fear of Current or Ex-Partner: Not on file  . Emotionally Abused: Not on file  . Physically Abused: Not on file  . Sexually Abused: Not on file     Family History  Problem Relation Age of Onset  . Heart disease Brother        CABG age 38, redo bypass ~50  . Heart disease Paternal Grandmother   . Heart disease Cousin        Maternal side  . Heart attack Brother   . Colon cancer Maternal Grandfather 60       METS to colon from Lung CA per pt  . Lung cancer Maternal Grandfather   . Colon polyps Neg Hx   . Esophageal cancer Neg Hx   . Rectal cancer Neg Hx   . Stomach cancer Neg Hx      Review of Systems: All other systems reviewed and are otherwise negative except as noted above.  Physical Exam: Vitals:   07/19/19 1430 07/19/19 1445 07/19/19 1500 07/19/19 1515  BP: 140/89 132/83 134/89 123/79  Pulse: 66 (!) 25 95 64  Resp: 16 17 15 15   Temp:      TempSrc:      SpO2: 98% 98% 98% 97%  Weight:      Height:        GEN- The patient is well appearing, alert and oriented x 3 today.   HEENT: normocephalic, atraumatic; sclera clear, conjunctiva pink; hearing intact; oropharynx clear; neck supple Lungs- Clear to  ausculation bilaterally, normal work of breathing.  No wheezes, rales, rhonchi Heart- Irregular rate and rhythm, no murmurs, rubs or gallops GI- soft, non-tender, non-distended, bowel sounds present Extremities- no clubbing, cyanosis, or edema; DP/PT/radial pulses 2+ bilaterally MS- no significant deformity or atrophy Skin- warm and dry, no rash or lesion Psych- euthymic mood, full affect Neuro- strength and sensation are intact  Labs:   Lab Results  Component Value Date   WBC 10.6 (H) 07/19/2019   HGB 16.7 07/19/2019   HCT 48.8 07/19/2019  MCV 87.8 07/19/2019   PLT 291 07/19/2019    Recent Labs  Lab 07/19/19 1220  NA 139  K 3.4*  CL 103  CO2 24  BUN 14  CREATININE 1.02  CALCIUM 9.3  GLUCOSE 116*      Radiology/Studies: DG Chest 2 View  Result Date: 07/10/2019 CLINICAL DATA:  Chest pain EXAM: CHEST - 2 VIEW COMPARISON:  04/08/2019 FINDINGS: There is elevation of the right hemidiaphragm. The heart size is stable. The patient is status post prior CABG. There is no pneumothorax. No large pleural effusion. There may be a minimal amount of atelectasis at the left lung base. IMPRESSION: No active cardiopulmonary disease. Electronically Signed   By: Constance Holster M.D.   On: 07/10/2019 03:01    EKG: NSR with frequent short runs of SVT, underling sinus rate ~60-70 bpm (personally reviewed)  TELEMETRY: NSR with intermittent atrial tachycardia as high as 120-130s (personally reviewed)  Assessment/Plan: 1.  Palpitations/Atrial tachycardia On diltiazem 300 mg daily with extra 30 mg as needed Echo 04/09/2019 LVEF 60-65%, LA diam 4.0 cm (1.92 cm/m2), LA vol 43.0 ml (20.69 ml/m2) TSH normal 07/10/2019. Previously taken off of flecainide with h/o CAD/CABG.  Had discussed Multaq, but has been cost prohibitive. Could consider tikosyn/Sotalol depending on affordability. Would prefer to avoid long term amiodarone, but could use as a bridge to discuss ablation.  2. Hypokalemia  K  3.4 on arrival. Will supp.   3. CAD s/p CABG "7 years ago) Denies ischemic symptoms. HS-Trop negative, and last ED check.  For questions or updates, please contact [place] Please consult [email or web address] for contact info under Cardiology/STEMI.  Jacalyn Lefevre, PA-C  07/19/2019 3:39 PM

## 2019-07-19 NOTE — ED Notes (Signed)
CBG 122 

## 2019-07-19 NOTE — ED Notes (Signed)
Patient denies pain, CP, SOB and is resting comfortably.

## 2019-07-19 NOTE — Telephone Encounter (Signed)
Patient states that on Saturday his heart was racing for 4-5 hours. He also felt that it was fluttering and skipping. Later in the day he felt better but woke up Sunday morning with the same symptoms. He stated that his heart would skip every 15-20 beats. He his still reporting these same symptoms this morning with skips more frequently. He reports back pain, discomfort under his sternum and lightheadedness. He was seen in the ED on 12/19 for chest pain. He states that he was not having these skipped beats at that time.  Patient is unable to take his BP or HR but does feel like his heart is racing. He also reports having no energy and feeling lethargic. He did take an extra dose of diltiazem 30 mg in the middle of the night. I advised him that he should go ahead and take another as he his prescribed up to 4 30 mg tablets daily as needed.  Patient was scheduled by operator for an appointment with Tyson Babinski, PA-C for tomorrow. The patient is not sure if he can wait until tomorrow. I advised him that he should go to the ER to be seen.

## 2019-07-19 NOTE — Telephone Encounter (Signed)
Operator called into triage with the pt on the line. See note from the operator. I advised the operator to please tell the pt we will call him back in just a few minutes as all nurses are on the phone at this time.

## 2019-07-19 NOTE — Telephone Encounter (Signed)
Patient c/o Palpitations:  High priority if patient c/o lightheadedness, shortness of breath, or chest pain  1) How long have you had palpitations/irregular HR/ Afib? Are you having the symptoms now? Started Saturday 07/17/19 then stopped same day. Started again 07/18/19 & is still experiencing symptoms now.     2) Are you currently experiencing lightheadedness, SOB or CP? Lightheadedness, A little Chest Pain    3) Do you have a history of afib (atrial fibrillation) or irregular heart rhythm? Yes   4) Have you checked your BP or HR? (document readings if available): No   5) Are you experiencing any other symptoms? Lightheadedness, a little chest pain, & discomfort     Patient has appointment scheduled for hospital f/u and current symptoms for tomorrow 07/20/19.

## 2019-07-20 ENCOUNTER — Ambulatory Visit: Payer: Medicare Other | Admitting: Physician Assistant

## 2019-07-20 ENCOUNTER — Encounter (HOSPITAL_COMMUNITY): Payer: Self-pay | Admitting: Internal Medicine

## 2019-07-20 LAB — BASIC METABOLIC PANEL
Anion gap: 8 (ref 5–15)
Anion gap: 7 (ref 5–15)
BUN: 15 mg/dL (ref 8–23)
BUN: 13 mg/dL (ref 8–23)
CO2: 27 mmol/L (ref 22–32)
CO2: 26 mmol/L (ref 22–32)
Calcium: 8.9 mg/dL (ref 8.9–10.3)
Calcium: 9.1 mg/dL (ref 8.9–10.3)
Chloride: 104 mmol/L (ref 98–111)
Chloride: 105 mmol/L (ref 98–111)
Creatinine, Ser: 1.01 mg/dL (ref 0.61–1.24)
Creatinine, Ser: 1.04 mg/dL (ref 0.61–1.24)
GFR calc Af Amer: >60 mL/min (ref >60)
GFR calc Af Amer: >60 mL/min (ref >60)
GFR calc non Af Amer: >60 mL/min (ref >60)
GFR calc non Af Amer: >60 mL/min (ref >60)
Glucose, Bld: 94 mg/dL (ref 70–99)
Glucose, Bld: 93 mg/dL (ref 70–99)
Potassium: 3.7 mmol/L (ref 3.5–5.1)
Potassium: 4.4 mmol/L (ref 3.5–5.1)
Sodium: 139 mmol/L (ref 135–145)
Sodium: 138 mmol/L (ref 135–145)

## 2019-07-20 LAB — MAGNESIUM: Magnesium: 2 mg/dL (ref 1.7–2.4)

## 2019-07-20 MED ORDER — SODIUM CHLORIDE 0.9 % IV SOLN: 250.0000 mL | INTRAVENOUS | Status: DC | PRN

## 2019-07-20 MED ORDER — SOTALOL HCL 120 MG PO TABS
120.0000 mg | ORAL_TABLET | Freq: Two times a day (BID) | ORAL | Status: DC
  Administered 2019-07-20 – 2019-07-22 (×5): 120 mg via ORAL
  Filled 2019-07-20 (×6): qty 1

## 2019-07-20 MED ORDER — DOFETILIDE 500 MCG PO CAPS: 500.0000 ug | ORAL_CAPSULE | Freq: Two times a day (BID) | ORAL | Status: DC

## 2019-07-20 MED ORDER — POTASSIUM CHLORIDE CRYS ER 20 MEQ PO TBCR
40.0000 meq | EXTENDED_RELEASE_TABLET | Freq: Once | ORAL | Status: AC
  Administered 2019-07-20: 40 meq via ORAL
  Filled 2019-07-20: qty 2

## 2019-07-20 MED ORDER — SODIUM CHLORIDE 0.9% FLUSH
3.0000 mL | Freq: Two times a day (BID) | INTRAVENOUS | Status: DC
  Administered 2019-07-20 – 2019-07-21 (×4): 3 mL via INTRAVENOUS

## 2019-07-20 MED ORDER — SODIUM CHLORIDE 0.9% FLUSH: 3.0000 mL | INTRAVENOUS | Status: DC | PRN

## 2019-07-20 MED ORDER — POTASSIUM CHLORIDE CRYS ER 20 MEQ PO TBCR
20.0000 meq | EXTENDED_RELEASE_TABLET | Freq: Once | ORAL | Status: AC
  Administered 2019-07-20: 20 meq via ORAL
  Filled 2019-07-20: qty 1

## 2019-07-20 NOTE — Telephone Encounter (Signed)
Sent to the pharmacy by e-scribe. 

## 2019-07-20 NOTE — Progress Notes (Addendum)
Electrophysiology Rounding Note  Patient Name: Terry Hansen Date of Encounter: 07/20/2019  Primary Cardiologist: Jenkins Rouge, MD Electrophysiologist: Will Meredith Leeds, MD   Subjective   The patient is doing well today.  AT has quieted overnight. NSR 60s currently. At this time, the patient denies chest pain, shortness of breath, or any new concerns.  Inpatient Medications    Scheduled Meds: . aspirin EC  81 mg Oral Daily  . atorvastatin  40 mg Oral QHS  . citalopram  10 mg Oral Daily  . diltiazem  300 mg Oral Daily  . enoxaparin (LOVENOX) injection  40 mg Subcutaneous Q24H  . losartan  50 mg Oral QHS   Continuous Infusions:  PRN Meds: acetaminophen, fluticasone, nitroGLYCERIN, ondansetron (ZOFRAN) IV, pantoprazole   Vital Signs    Vitals:   07/19/19 1737 07/19/19 2033 07/20/19 0424 07/20/19 0426  BP: (!) 146/87 106/70 123/80   Pulse: 71 74 (!) 58   Resp: 16 17 17    Temp: 98.3 F (36.8 C) 98.3 F (36.8 C) 98.6 F (37 C)   TempSrc: Oral Oral Oral   SpO2: 100% 97% 97%   Weight: 87.8 kg   87.9 kg  Height: 5\' 11"  (1.803 m)       Intake/Output Summary (Last 24 hours) at 07/20/2019 0736 Last data filed at 07/20/2019 0115 Gross per 24 hour  Intake 360 ml  Output 250 ml  Net 110 ml   Filed Weights   07/19/19 1154 07/19/19 1737 07/20/19 0426  Weight: 88.5 kg 87.8 kg 87.9 kg    Physical Exam  BP 124/81 (BP Location: Left Arm)   Pulse (!) 57   Temp 98.6 F (37 C) (Oral)   Resp 18   Ht 5\' 11"  (1.803 m)   Wt 87.9 kg   SpO2 96%   BMI 27.03 kg/m  Well developed and nourished in no acute distress HENT normal Neck supple with JVP-  flat  Clear Regular rate and rhythm, no murmurs or gallops Abd-soft with active BS No Clubbing cyanosis edema Skin-warm and dry A & Oriented  Grossly normal sensory and motor function      Labs    CBC Recent Labs    07/19/19 1220  WBC 10.6*  HGB 16.7  HCT 48.8  MCV 87.8  PLT [id number]   Basic Metabolic  Panel Recent Labs    07/19/19 1220 07/19/19 1632 07/20/19 0422  NA 139  --  139  K 3.4*  --  3.7  CL 103  --  104  CO2 24  --  27  GLUCOSE 116*  --  94  BUN 14  --  15  CREATININE 1.02  --  1.01  CALCIUM 9.3  --  8.9  MG  --  2.0 2.0   Liver Function Tests No results for input(s): AST, ALT, ALKPHOS, BILITOT, PROT, ALBUMIN in the last 72 hours. No results for input(s): LIPASE, AMYLASE in the last 72 hours. Cardiac Enzymes No results for input(s): CKTOTAL, CKMB, CKMBINDEX, TROPONINI in the last 72 hours.   Telemetry    NSR this am in 60s (personally reviewed)  Radiology    No results found.  Patient Profile     Terry Hansen is a 63 y.o. male with a history of atrial tachycardia, palpitations, HTN, and CAD s/p CABG who is being seen for the evaluation of palpitations/atrial tachycardia at the request of Dr. Alvino Chapel.  Assessment & Plan    1.  Palpitations/Atrial tachycardia Continue diltiazem 300 mg daily  for now.  EF normal by echo 04/09/2019 TSH normal 07/10/2019. Previously taken off of flecainide with h/o CAD/CABG. Would not re-challenge AAD discussions on going due to his frequency of events being ~ once a month.  He is hesitant for tikosyn/sotalol because unsure what cost would be (insurance changes 07/23/19) He would like to consider tikosyn, but is still concerned about pricing.   2. Hypokalemia  K 3.7 this am.  Continue supp.   3. CAD s/p CABG "7 years ago" Limites AAD options Denies ischemic symptoms. HS-trop negative.   ADDENDUM Tikosyn likely to be cost prohibitive after discussion with his insurance carrier.   Will proceed with tikosyn.   For questions or updates, please contact [place] Please consult [email or web address] for contact info under Cardiology/STEMI.  Signed, Shirley Friar, PA-C  07/20/2019, 7:36 AM  Presumed sinus rhythm.  Lengthy discussion regarding antiarrhythmic alternatives.  We will look into the cost of sotalol  versus dofetilide and also broached the possibility of intermittent therapy with dronaderone to be taken on an as-needed basis.  He would like to undertake antiarrhythmic suppression as opposed to as needed therapy.  Hence we will begin therapy once we find out regarding costs.  Discussed proarrhythmic risks.  Continue potassium supplementation.  More than 50% of 25  min was spent in counseling related to the above

## 2019-07-20 NOTE — Progress Notes (Signed)
Afternoon EKG reviewed after first dose of sotalol. QTc stable.    Continue current dose.   Legrand Como [address] [place], [place]

## 2019-07-20 NOTE — Progress Notes (Signed)
Patient has financial concerns about his new medications (Dofetlide). Patient has a very high co-pay and would like to discuss other options before he will start the medication.

## 2019-07-20 NOTE — Care Management (Addendum)
CM consult acknowledged to check tikosyn and sotalol pricing; request sent to the TOC CMA.   Benefits check:  1. TIKOSYN 125 MCG BID   250 MCG BID  500 MCG BID  NOT COVER / NON-FORMULARY  PRIOR APPROVAL- YES # [phone]    2. DOFETILIDE 125 MC G  COVER- YES  CO-PAY- $243.08  TIER- 3 DRUG  PRIOR APPROVAL- NO  90 DAY SUPPLY FOR M/O $387.83   3. DOFETILIDE 250 MCG BID  COVER- YES  CO-PAY- $ 203.96  TIER- 3 DRUG  PRIOR APPROVAL- NO  90 DAY SUPPLY FOR M/O $387.82    4. DOFETILIDE  500 MCG BID  COVER- YES  CO-PAY- $204.02  TIER- 3 DRUG  PRIOR APPROVAL -NO  90 DAY SUPPLY FOR M/O $387.82    1. BETAPACE 80 MG BID  NOT COVER / NON-FORMULARY  PRIOR APPROVAL- YES # [phone]   2. SOTALOL 80 MG BID  COVER- YES  COP-PAY- $2.00  TIER- 2 DRUG  PRIOR APPROVAL- NO  90 DAY SUPPLY FOR M/O $6.00   DEDUCTIBLE : NOT MET   PREFERRED PHARMACY : YES  [facility]    Natalie Gay MSN, RN, NCM-BC, ACM-RN [phone] 

## 2019-07-20 NOTE — Progress Notes (Signed)
Pharmacy: Dofetilide (Tikosyn) - Initial Consult Assessment and Electrolyte Replacement  Pharmacy consulted to assist in monitoring and replacing electrolytes in this 63 y.o. male admitted on 07/19/2019 undergoing dofetilide initiation. First dofetilide dose: 500 mcg BID on 12/29  Assessment:  Patient Exclusion Criteria: If any screening criteria checked as "Yes", then  patient  should NOT receive dofetilide until criteria item is corrected.  If "Yes" please indicate correction plan.  YES  NO Patient  Exclusion Criteria Correction Plan   []   [x]   Baseline QTc interval is greater than or equal to 440 msec. IF above YES box checked dofetilide contraindicated unless patient has ICD; then may proceed if QTc 500-550 msec or with known ventricular conduction abnormalities may proceed with QTc 550-600 msec. QTc = 0.46 Likely lower now - discussed with MD   []   [x]   Patient is known or suspected to have a digoxin level greater than 2 ng/ml: No results found for: DIGOXIN     []   [x]   Creatinine clearance less than 20 ml/min (calculated using Cockcroft-Gault, actual body weight and serum creatinine): Estimated Creatinine Clearance: 77.7 mL/min (by C-G formula based on SCr of 1.01 mg/dL).     []   [x]  Patient has received drugs known to prolong the QT intervals within the last 48 hours (phenothiazines, tricyclics or tetracyclic antidepressants, erythromycin, H-1 antihistamines, cisapride, fluoroquinolones, azithromycin). Drugs not listed above may have an, as yet, undetected potential to prolong the QT interval, updated information on QT prolonging agents is available at this website:QT prolonging agents or www.crediblemeds.org    []   [x]   Patient received a dose of hydrochlorothiazide (Oretic) alone or in any combination including triamterene (Dyazide, Maxzide) in the last 48 hours.    []   [x]  Patient received a medication known to increase dofetilide plasma concentrations prior to initial  dofetilide dose:  . Trimethoprim (Primsol, Proloprim) in the last 36 hours . Verapamil (Calan, Verelan) in the last 36 hours or a sustained release dose in the last 72 hours . Megestrol (Megace) in the last 5 days  . Cimetidine (Tagamet) in the last 6 hours . Ketoconazole (Nizoral) in the last 24 hours . Itraconazole (Sporanox) in the last 48 hours  . Prochlorperazine (Compazine) in the last 36 hours     []   [x]   Patient is known to have a history of torsades de pointes; congenital or acquired long QT syndromes.    []   [x]   Patient has received a Class 1 antiarrhythmic with less than 2 half-lives since last dose. (Disopyramide, Quinidine, Procainamide, Lidocaine, Mexiletine, Flecainide, Propafenone)    []   [x]   Patient has received amiodarone therapy in the past 3 months or amiodarone level is greater than 0.3 ng/ml.    Patient has not been anticoagulated since rhythm is AT and not afib.  Labs:    Component Value Date/Time   K 3.7 07/20/2019 0422   MG 2.0 07/20/2019 0422     Plan: Potassium: K 3.5-3.7:  Hold Tikosyn initiation and give KCl 60 mEq po x1 and repeat BMET 2hr after dose - repeat appropriate dose if K < 4    Magnesium: Mg 1.8-2: Give Mg 2 gm IV x1 to prevent Mg from dropping below 1.8 - do not need to recheck Mg. Appropriate to initiate Tikosyn   Thank you for allowing pharmacy to participate in this patient's care  Marguerite Olea, [facility] Clinical Pharmacist Phone [phone]  07/20/2019 8:23 AM

## 2019-07-20 NOTE — Progress Notes (Signed)
  Discussed with patient, who spoke to his new insurance carrier.   Tikosyn would run him about $140 (500 mcg tablet), and may be even more for lower strength.  GoodRx would be a reasonable alternative ($30-50 for the 500 mcg), but the pricing would be similar for the 250 or 125 dosing.   With the uncertainty of the price, pt would like to use Sotalol.  Discussed with Dr. Caryl Comes. Electrolytes and QTc OK.   Will start Sotalol this am at 120 mg BID.   Legrand Como [address] [place], PA-C  07/20/2019 11:24 AM

## 2019-07-21 DIAGNOSIS — R001 Bradycardia, unspecified: Secondary | ICD-10-CM

## 2019-07-21 LAB — BASIC METABOLIC PANEL
Anion gap: 7 (ref 5–15)
BUN: 12 mg/dL (ref 8–23)
CO2: 25 mmol/L (ref 22–32)
Calcium: 9 mg/dL (ref 8.9–10.3)
Chloride: 105 mmol/L (ref 98–111)
Creatinine, Ser: 0.86 mg/dL (ref 0.61–1.24)
GFR calc Af Amer: >60 mL/min (ref >60)
GFR calc non Af Amer: >60 mL/min (ref >60)
Glucose, Bld: 88 mg/dL (ref 70–99)
Potassium: 4.1 mmol/L (ref 3.5–5.1)
Sodium: 137 mmol/L (ref 135–145)

## 2019-07-21 LAB — MAGNESIUM: Magnesium: 1.9 mg/dL (ref 1.7–2.4)

## 2019-07-21 MED ORDER — MAGNESIUM SULFATE 2 GM/50ML IV SOLN
2.0000 g | Freq: Once | INTRAVENOUS | Status: AC
  Administered 2019-07-21: 2 g via INTRAVENOUS
  Filled 2019-07-21: qty 50

## 2019-07-21 NOTE — H&P (View-Only) (Signed)
Electrophysiology Rounding Note  Patient Name: Terry Hansen Date of Encounter: 07/21/2019  Primary Cardiologist: Johnsie Cancel Electrophysiologist: Curt Bears   Subjective   The patient is doing well today.  At this time, the patient denies chest pain, shortness of breath, or any new concerns.  Inpatient Medications    Scheduled Meds: . aspirin EC  81 mg Oral Daily  . atorvastatin  40 mg Oral QHS  . citalopram  10 mg Oral Daily  . diltiazem  300 mg Oral Daily  . enoxaparin (LOVENOX) injection  40 mg Subcutaneous Q24H  . losartan  50 mg Oral QHS  . sodium chloride flush  3 mL Intravenous Q12H  . sotalol  120 mg Oral Q12H   Continuous Infusions: . sodium chloride    . magnesium sulfate bolus IVPB     PRN Meds: sodium chloride, acetaminophen, fluticasone, nitroGLYCERIN, ondansetron (ZOFRAN) IV, pantoprazole, sodium chloride flush   Vital Signs    Vitals:   07/20/19 1754 07/20/19 2058 07/21/19 0416 07/21/19 0418  BP: 124/81 137/78 116/76   Pulse: (!) 57 (!) 57 (!) 55   Resp: 18 16 16    Temp: 98.6 F (37 C) 99.1 F (37.3 C) 98.5 F (36.9 C)   TempSrc: Oral Oral Oral   SpO2: 96% 96% 95%   Weight:    87.4 kg  Height:        Intake/Output Summary (Last 24 hours) at 07/21/2019 0736 Last data filed at 07/20/2019 2040 Gross per 24 hour  Intake 1183 ml  Output 1050 ml  Net 133 ml   Filed Weights   07/19/19 1737 07/20/19 0426 07/21/19 0418  Weight: 87.8 kg 87.9 kg 87.4 kg    Physical Exam    GEN- The patient is well appearing, alert and oriented x 3 today.   Head- normocephalic, atraumatic Eyes-  Sclera clear, conjunctiva pink Ears- hearing intact Oropharynx- clear Neck- supple Lungs- Clear to ausculation bilaterally, normal work of breathing Heart- Regular rate and rhythm, no murmurs, rubs or gallops GI- soft, NT, ND, + BS Extremities- no clubbing, cyanosis, or edema Skin- no rash or lesion Psych- euthymic mood, full affect Neuro- strength and sensation are  intact  Labs    CBC Recent Labs    07/19/19 1220  WBC 10.6*  HGB 16.7  HCT 48.8  MCV 87.8  PLT [id number]   Basic Metabolic Panel Recent Labs    07/20/19 0422 07/20/19 1030 07/21/19 0407  NA 139 138 137  K 3.7 4.4 4.1  CL 104 105 105  CO2 27 26 25   GLUCOSE 94 93 88  BUN 15 13 12   CREATININE 1.01 1.04 0.86  CALCIUM 8.9 9.1 9.0  MG 2.0  --  1.9     Telemetry    SR, short run of AT last night  (personally reviewed)  Radiology    No results found.   Assessment & Plan    1.  Atrial tachycardia Doing well on Sotalol EKG yesterday stable Labs stable today Continue Sotalol with plans to discharge after tomorrow morning's dose  2.  CAD s/p CABG No recent ischemic symptoms  3) SINUS BRADYCARDIA    For questions or updates, please contact [facility] Please consult [email or web address] for contact info under Cardiology/STEMI.  Signed, Chanetta Marshall, NP  07/21/2019, 7:36 AM    Tolerating sotalol; QT within range  12/30 1158 h  483msec K > 4.0  Will continue sotalol Will have to observe for bradycardia Stop dilt and may need something else for BP  Will consider amlodipine or ARB if needed

## 2019-07-21 NOTE — Progress Notes (Signed)
Pharmacy: Sotalol (Betapace) - Follow Up For Assessment and Electrolyte Replacement Consult   Pharmacy consulted to assist in monitoring and replacing electrolytes in this 63 y.o. male admitted on 07/19/2019 undergoing sotalol initiation .   First sotalol dose: 07/20/19@1208   Labs:    Component Value Date/Time   K 4.1 07/21/2019 0407   MG 1.9 07/21/2019 0407     Plan: Potassium: K >/= 4: No additional supplementation needed  Magnesium: Mg 1.8-2: Give Mg 2 gm IV x1    Thank you for allowing pharmacy to participate in this patient's care   Antonietta Jewel, PharmD, [place] Pharmacist  Phone: [phone]  Please check AMION for all [place] phone numbers After 10:00 PM, call [place] [phone] 07/21/2019  7:24 AM

## 2019-07-21 NOTE — Progress Notes (Addendum)
Electrophysiology Rounding Note  Patient Name: Terry Hansen Date of Encounter: 07/21/2019  Primary Cardiologist: Johnsie Cancel Electrophysiologist: Curt Bears   Subjective   The patient is doing well today.  At this time, the patient denies chest pain, shortness of breath, or any new concerns.  Inpatient Medications    Scheduled Meds: . aspirin EC  81 mg Oral Daily  . atorvastatin  40 mg Oral QHS  . citalopram  10 mg Oral Daily  . diltiazem  300 mg Oral Daily  . enoxaparin (LOVENOX) injection  40 mg Subcutaneous Q24H  . losartan  50 mg Oral QHS  . sodium chloride flush  3 mL Intravenous Q12H  . sotalol  120 mg Oral Q12H   Continuous Infusions: . sodium chloride    . magnesium sulfate bolus IVPB     PRN Meds: sodium chloride, acetaminophen, fluticasone, nitroGLYCERIN, ondansetron (ZOFRAN) IV, pantoprazole, sodium chloride flush   Vital Signs    Vitals:   07/20/19 1754 07/20/19 2058 07/21/19 0416 07/21/19 0418  BP: 124/81 137/78 116/76   Pulse: (!) 57 (!) 57 (!) 55   Resp: 18 16 16    Temp: 98.6 F (37 C) 99.1 F (37.3 C) 98.5 F (36.9 C)   TempSrc: Oral Oral Oral   SpO2: 96% 96% 95%   Weight:    87.4 kg  Height:        Intake/Output Summary (Last 24 hours) at 07/21/2019 0736 Last data filed at 07/20/2019 2040 Gross per 24 hour  Intake 1183 ml  Output 1050 ml  Net 133 ml   Filed Weights   07/19/19 1737 07/20/19 0426 07/21/19 0418  Weight: 87.8 kg 87.9 kg 87.4 kg    Physical Exam    GEN- The patient is well appearing, alert and oriented x 3 today.   Head- normocephalic, atraumatic Eyes-  Sclera clear, conjunctiva pink Ears- hearing intact Oropharynx- clear Neck- supple Lungs- Clear to ausculation bilaterally, normal work of breathing Heart- Regular rate and rhythm, no murmurs, rubs or gallops GI- soft, NT, ND, + BS Extremities- no clubbing, cyanosis, or edema Skin- no rash or lesion Psych- euthymic mood, full affect Neuro- strength and sensation are  intact  Labs    CBC Recent Labs    07/19/19 1220  WBC 10.6*  HGB 16.7  HCT 48.8  MCV 87.8  PLT [id number]   Basic Metabolic Panel Recent Labs    07/20/19 0422 07/20/19 1030 07/21/19 0407  NA 139 138 137  K 3.7 4.4 4.1  CL 104 105 105  CO2 27 26 25   GLUCOSE 94 93 88  BUN 15 13 12   CREATININE 1.01 1.04 0.86  CALCIUM 8.9 9.1 9.0  MG 2.0  --  1.9     Telemetry    SR, short run of AT last night  (personally reviewed)  Radiology    No results found.   Assessment & Plan    1.  Atrial tachycardia Doing well on Sotalol EKG yesterday stable Labs stable today Continue Sotalol with plans to discharge after tomorrow morning's dose  2.  CAD s/p CABG No recent ischemic symptoms  3) SINUS BRADYCARDIA    For questions or updates, please contact [facility] Please consult [email or web address] for contact info under Cardiology/STEMI.  Signed, Chanetta Marshall, NP  07/21/2019, 7:36 AM    Tolerating sotalol; QT within range  12/30 1158 h  479msec K > 4.0  Will continue sotalol Will have to observe for bradycardia Stop dilt and may need something else for BP  Will consider amlodipine or ARB if needed

## 2019-07-22 LAB — BASIC METABOLIC PANEL
Anion gap: 7 (ref 5–15)
BUN: 15 mg/dL (ref 8–23)
CO2: 27 mmol/L (ref 22–32)
Calcium: 8.8 mg/dL — ABNORMAL LOW (ref 8.9–10.3)
Chloride: 102 mmol/L (ref 98–111)
Creatinine, Ser: 1.1 mg/dL (ref 0.61–1.24)
GFR calc Af Amer: >60 mL/min (ref >60)
GFR calc non Af Amer: >60 mL/min (ref >60)
Glucose, Bld: 98 mg/dL (ref 70–99)
Potassium: 4.3 mmol/L (ref 3.5–5.1)
Sodium: 136 mmol/L (ref 135–145)

## 2019-07-22 LAB — MAGNESIUM: Magnesium: 2.3 mg/dL (ref 1.7–2.4)

## 2019-07-22 MED ORDER — SOTALOL HCL 120 MG PO TABS: 120.0000 mg | ORAL_TABLET | Freq: Two times a day (BID) | ORAL | 2 refills | Status: DC

## 2019-07-22 NOTE — Progress Notes (Signed)
Pharmacy: Sotalol (Betapace) - Follow Up For Assessment and Electrolyte Replacement Consult   Pharmacy consulted to assist in monitoring and replacing electrolytes in this 63 y.o. male admitted on 07/19/2019 undergoing sotalol initiation .   First sotalol dose: 07/20/19@1208   Labs:    Component Value Date/Time   K 4.3 07/22/2019 0327   MG 2.3 07/22/2019 0327     Plan: Potassium: K >/= 4: No additional supplementation needed  Magnesium: MG 2.3, no supplement needed.    Terry Hansen, PharmD, BCPS, FNKF Clinical Pharmacist [place] Please utilize Amion for appropriate phone number to reach the unit pharmacist ([place])   07/22/2019  9:41 AM

## 2019-07-22 NOTE — Discharge Summary (Addendum)
ELECTROPHYSIOLOGY PROCEDURE DISCHARGE SUMMARY    Patient ID: Terry Hansen,  MRN: [id number], DOB/AGE: 1956-01-06 63 y.o.  Admit date: 07/19/2019 Discharge date: 07/22/2019  Primary Care Physician: Johnsie Cancel Primary Cardiologist: [facility]  Primary Discharge Diagnosis:  Active Problems:   Atrial tachycardia [facility])  Secondary Discharge Diagnosis: 1.  CAD s/p CABG 2.  HTN   No Known Allergies   Brief HPI/Hospital Course:  Takahiro Baldry is a 63 y.o. male with the above past medical history. He presented to the ER on 07/19/19 with increased palpitations despite Mutaq (was previously on Flecainide).  He was seen by Dr Caryl Comes and treatment options were discussed including Tikosyn vs Sotalol. Because of cost the patient elected to start Sotalol.  Labs and QTc remained stable this admission. Some bradycardia was noted and Diltiazem was discontinued.  EKG and labs arranged for next week in office. Follow up with Dr Johnsie Cancel and Dr Curt Bears as scheduled.   Physical Exam: Vitals:   07/21/19 0944 07/21/19 1500 07/21/19 2024 07/22/19 0517  BP: 112/74 109/62 122/80 115/66  Pulse: 61 (!) 51 (!) 49 (!) 49  Resp:  16    Temp:  99.1 F (37.3 C) 98 F (36.7 C) 98.1 F (36.7 C)  TempSrc:  Oral Oral Oral  SpO2: 95%  95% 97%  Weight:    87.3 kg  Height:        GEN- The patient is well appearing, alert and oriented x 3 today.   HEENT: normocephalic, atraumatic; sclera clear, conjunctiva pink; hearing intact; oropharynx clear; neck supple  Lungs- Clear to ausculation bilaterally, normal work of breathing.  No wheezes, rales, rhonchi Heart- Regular rate and rhythm  GI- soft, non-tender, non-distended, bowel sounds present, no hepatosplenomegaly Extremities- no clubbing, cyanosis, or edema  MS- no significant deformity or atrophy Skin- warm and dry, no rash or lesion Psych- euthymic mood, full affect Neuro- strength and sensation are intact    Labs:   Lab Results  Component Value Date   WBC 10.6 (H) 07/19/2019   HGB 16.7 07/19/2019   HCT 48.8 07/19/2019   MCV 87.8 07/19/2019   PLT 291 07/19/2019    Recent Labs  Lab 07/22/19 0327  NA 136  K 4.3  CL 102  CO2 27  BUN 15  CREATININE 1.10  CALCIUM 8.8*  GLUCOSE 98     Discharge Medications:  Allergies as of 07/22/2019   No Known Allergies     Medication List    STOP taking these medications   diltiazem 300 MG 24 hr capsule Commonly known as: CARDIZEM CD     TAKE these medications   aspirin EC 81 MG tablet Take 1 tablet (81 mg total) by mouth daily.   atorvastatin 40 MG tablet Commonly known as: LIPITOR TAKE 1 TABLET(40 MG) BY MOUTH AT BEDTIME What changed: See the new instructions.   citalopram 10 MG tablet Commonly known as: CeleXA Take 1 tablet (10 mg total) by mouth daily.   Coenzyme Q10 200 MG capsule Take 200 mg by mouth daily with breakfast.   diltiazem 30 MG tablet Commonly known as: CARDIZEM Take 1 tablet (30 mg total) by mouth 4 (four) times daily as needed (elevated heart rates).   fluticasone 50 MCG/ACT nasal spray Commonly known as: FLONASE Place 2 sprays into both nostrils daily. What changed:   when to take this  reasons to take this   ibuprofen 200 MG tablet Commonly known as: ADVIL Take 600 mg by mouth every 6 (six) hours  as needed (for migraines).   losartan 50 MG tablet Commonly known as: COZAAR TAKE 1 TABLET(50 MG) BY MOUTH DAILY. Please keep upcoming appt in January with Dr. Johnsie Cancel before anymore refills. Thank you What changed:   how much to take  how to take this  when to take this  additional instructions   nitroGLYCERIN 0.4 MG SL tablet Commonly known as: NITROSTAT Place 1 tablet (0.4 mg total) under the tongue every 5 (five) minutes as needed for chest pain.   omeprazole 20 MG tablet Commonly known as: PRILOSEC OTC Take 20 mg by mouth daily as needed (acid reflux).   sotalol 120 MG tablet Commonly known as: BETAPACE Take 1 tablet (120 mg  total) by mouth every 12 (twelve) hours.       Disposition: Pt is being discharged home today in good condition.  Follow-up Information    [place] Office Follow up on 07/30/2019.   Specialty: Cardiology Why: at [facility] for EKG and Labs Contact information: [address] [place]       Josue Hector, MD Follow up on 08/16/2019.   Specialty: Cardiology Why: at 8:45AM Contact information: [address] [place] [address] [phone]        Constance Haw, MD Follow up on 10/05/2019.   Specialty: Cardiology Why: at 9:30AM Contact information: [place] [address] [phone]           Duration of Discharge Encounter: Greater than 30 minutes including physician time.  Signed, Chanetta Marshall, NP 07/22/2019 8:44 AM  Seen and agree  ECG shows QTc < 500 Follow up next week to reassess followBP at home

## 2019-07-30 ENCOUNTER — Other Ambulatory Visit: Payer: Medicare Other | Admitting: *Deleted

## 2019-07-30 ENCOUNTER — Other Ambulatory Visit: Payer: Self-pay

## 2019-07-30 ENCOUNTER — Encounter: Payer: Self-pay | Admitting: Nurse Practitioner

## 2019-07-30 ENCOUNTER — Encounter: Payer: Self-pay | Admitting: Cardiovascular Disease

## 2019-07-30 ENCOUNTER — Ambulatory Visit (INDEPENDENT_AMBULATORY_CARE_PROVIDER_SITE_OTHER): Payer: Medicare Other | Admitting: Cardiovascular Disease

## 2019-07-30 VITALS — BP 130/60 | HR 56 | Ht 71.0 in | Wt 199.0 lb

## 2019-07-30 DIAGNOSIS — R079 Chest pain, unspecified: Secondary | ICD-10-CM | POA: Diagnosis not present

## 2019-07-30 DIAGNOSIS — I10 Essential (primary) hypertension: Secondary | ICD-10-CM

## 2019-07-30 DIAGNOSIS — I471 Supraventricular tachycardia: Secondary | ICD-10-CM

## 2019-07-30 DIAGNOSIS — I25810 Atherosclerosis of coronary artery bypass graft(s) without angina pectoris: Secondary | ICD-10-CM | POA: Diagnosis not present

## 2019-07-30 MED ORDER — ISOSORBIDE MONONITRATE ER 30 MG PO TB24: 30.0000 mg | ORAL_TABLET | Freq: Every day | ORAL | 3 refills | Status: DC

## 2019-07-30 NOTE — Progress Notes (Signed)
Reason for visit:  Brief HPI/Hospital Course:  1.) Terry Hansen is a 64 y.o. male with the above past medical history. He presented to the ER on 07/19/19 with increased palpitations despite Mutaq (was previously on Flecainide).  He was seen by Dr Caryl Comes and treatment options were discussed including Tikosyn vs Sotalol. Because of cost the patient elected to start Sotalol.  Labs and QTc remained stable this admission. Some bradycardia was noted and Diltiazem was discontinued.  EKG and labs arranged for next week in office. Follow up with Dr Johnsie Cancel and Dr Curt Bears as scheduled.  2.) Name of MD requesting visit: Dr.Klein at discharge from [facility] for start of Sotalol.  Electrophysiologist is Dr. Curt Bears-  Dr. Johnsie Cancel is Primary Cardiologist   3.) H&P:  History of PAT-He presented to the ER on 07/19/19 with increased palpitations despite Mutaq (was previously on Flecainide).  He was seen by Dr Caryl Comes and treatment options were discussed including Tikosyn vs Sotalol. Because of cost the patient elected to start Sotalol.  Labs and QTc remained stable this admission. Some bradycardia was noted and Diltiazem was discontinued.  EKG and labs arranged for next week in office. Follow up with Dr Johnsie Cancel and Dr Curt Bears as scheduled.  4.) ROS related to problem: pt went the ER for the following and is here in the office for follow-up He presented to the ER on 07/19/19 with increased palpitations despite Mutaq (was previously on Flecainide).  He was seen by Dr Caryl Comes and treatment options were discussed including Tikosyn vs Sotalol. Because of cost the patient elected to start Sotalol.  Labs and QTc remained stable this admission. Some bradycardia was noted and Diltiazem was discontinued. Pt was advised at discharge to come back in the office in one week for  EKG and labs, and f/u with Dr Johnsie Cancel and Dr Curt Bears as scheduled.  While getting EKG nurse visit, pt mentioned complaining that he had chest pain on Wed, which felt like  pins and needles, with irregular heart rate.  Pt took one nitro and this improved. Pt states his BP has been all over the place this week, with highest reading at 197/96.  V/S- today was 166/94-HR-56.  Pt states he is being compliant with taking all his cardiac meds prescribed, however when going over his medications again, he reports he has changed his BP medication Losartan around from taking this at bedtime to taking this in the morning time.   Pt also states he truly has not felt well since he was discharged from the hospital, but decided not to call into the office to inform us of this, and just endorse symptoms at today's nurse visit. This information was endorsed to DOD Dr. Acie Fredrickson, as well as his EKG was shown.  Pt will be added onto Dr. Elmarie Shiley DOD Schedule for today, for further assessment of complaints mentioned.   5.) Assessment and plan per MD: due to pts recent complaints of chest pain on Wed and today, accompanied with high blood pressure readings, utilizing nitro,  and cardiac History, pt will need to be added to DOD Dr. Elmarie Shiley schedule for this afternoon.    See physician  note below       Cardiology Office Note:    Date:  07/30/2019   ID:  Terry Hansen, DOB 1956-03-20, MRN [id number]  PCP:  [name], NP  Cardiologist:  Jenkins Rouge, MD  Electrophysiologist:  Will Meredith Leeds, MD   Referring MD: [name], NP   Chief Complaint  Patient  presents with  . Chest Pain  . Tachycardia    History of Present Illness:    Terry Hansen is a 64 y.o. male with a hx of atrial tachycardia, palpitations, hypertension, coronary artery disease-status post coronary artery bypass grafting.  I am seeing him today as a work in visit for episodes of chest pain.  He was recently in the hospital for initiation of sotalol and was told to come to the office today for an EKG to check his QT interval.  While he was here he informed the nurse that he been having some chest pain and  was sent to the DOD for further evaluation.  reports havnig some CP last Wednesday and today  CP occurred spontaneously  Had some irregular  tachycardia .  The tachycardia subsided and several hours later later, he developed chest  Took SL NTG , with significant relief. ( not completely ) woke up pain free.  Today , off and on cp , pressure ,  Did not take ntg today  Did not have cp prior to his diagnosis of CAD - was having migraines.  No migraines recenlty  Does not get regular exercise,  Walks some .   Is retired.   BP has been a bit elevated today   Past Medical History:  Diagnosis Date  . CAD (coronary artery disease)    a.  LHC 06/26/12:  pLAD 90%, prox/mid/dist CFS 20-30%, oRCA 100% (non dom), EF 55%;  b. Echo 12/13:  EF 55-60%, Gr 1 diast dysfn;  c. s/p CABG [facility]) 12/13: L-LAD/Dx    . GERD (gastroesophageal reflux disease)   . HLD (hyperlipidemia)   . Hypertension   . Ocular migraine    "maybe once q couple months" (06/02/2014)  . Unstable angina pectoris [facility])     Past Surgical History:  Procedure Laterality Date  . APPENDECTOMY  1970's  . CARDIAC CATHETERIZATION  06/2012  . CORONARY ARTERY BYPASS GRAFT  06/29/2012   Procedure: CORONARY ARTERY BYPASS GRAFTING (CABG);  Surgeon: Melrose Nakayama, MD;  Location: [facility];  Service: Open Heart Surgery;  Laterality: N/A;  times two using Left Internal Mammary Artery  . INGUINAL HERNIA REPAIR Bilateral [phone]  . LEFT HEART CATHETERIZATION WITH CORONARY ANGIOGRAM N/A 06/26/2012   Procedure: LEFT HEART CATHETERIZATION WITH CORONARY ANGIOGRAM;  Surgeon: Josue Hector, MD;  Location: [facility] CATH LAB;  Service: Cardiovascular;  Laterality: N/A;  . LEFT HEART CATHETERIZATION WITH CORONARY/GRAFT ANGIOGRAM N/A 06/03/2014   Procedure: LEFT HEART CATHETERIZATION WITH Beatrix Fetters;  Surgeon: Jettie Booze, MD;  Location: [facility] CATH LAB;  Service: Cardiovascular;  Laterality: N/A;  . NASAL SEPTUM SURGERY     For  uncontrolled epistaxis early 2013  . SIGMOIDOSCOPY  10-15 years ago   in [place], [place]    Current Medications: Current Meds  Medication Sig  . aspirin EC 81 MG tablet Take 1 tablet (81 mg total) by mouth daily.  Marland Kitchen atorvastatin (LIPITOR) 40 MG tablet TAKE 1 TABLET(40 MG) BY MOUTH AT BEDTIME  . citalopram (CELEXA) 10 MG tablet Take 1 tablet (10 mg total) by mouth daily.  . Coenzyme Q10 200 MG capsule Take 200 mg by mouth daily with breakfast.   . fluticasone (FLONASE) 50 MCG/ACT nasal spray Place 2 sprays into both nostrils daily. (Patient taking differently: Place 2 sprays into both nostrils daily as needed for allergies or rhinitis. )  . ibuprofen (ADVIL,MOTRIN) 200 MG tablet Take 600 mg by mouth every 6 (six) hours as needed (  for migraines).   . losartan (COZAAR) 50 MG tablet TAKE 1 TABLET(50 MG) BY MOUTH DAILY. Please keep upcoming appt in January with Dr. Johnsie Cancel before anymore refills. Thank you (Patient taking differently: Take 50 mg by mouth daily. )  . sotalol (BETAPACE) 120 MG tablet Take 1 tablet (120 mg total) by mouth every 12 (twelve) hours.     Allergies:   Patient has no known allergies.   Social History   Socioeconomic History  . Marital status: Divorced    Spouse name: Not on file  . Number of children: Not on file  . Years of education: Not on file  . Highest education level: Not on file  Occupational History  . Occupation: retired  Tobacco Use  . Smoking status: Never Smoker  . Smokeless tobacco: Never Used  Substance and Sexual Activity  . Alcohol use: Yes    Comment: occ-Social per pt  . Drug use: No  . Sexual activity: Not Currently  Other Topics Concern  . Not on file  Social History Narrative   Divorced 7 years ago   3 children   Works in Aeronautical engineer   Social Determinants of Radio broadcast assistant Strain:   . Difficulty of Paying Living Expenses: Not on file  Food Insecurity:   . Worried About Charity fundraiser in the Last  Year: Not on file  . Ran Out of Food in the Last Year: Not on file  Transportation Needs:   . Lack of Transportation (Medical): Not on file  . Lack of Transportation (Non-Medical): Not on file  Physical Activity:   . Days of Exercise per Week: Not on file  . Minutes of Exercise per Session: Not on file  Stress:   . Feeling of Stress : Not on file  Social Connections:   . Frequency of Communication with Friends and Family: Not on file  . Frequency of Social Gatherings with Friends and Family: Not on file  . Attends Religious Services: Not on file  . Active Member of Clubs or Organizations: Not on file  . Attends Archivist Meetings: Not on file  . Marital Status: Not on file     Family History: The patient's family history includes Colon cancer (age of onset: 91) in his maternal grandfather; Heart attack in his brother; Heart disease in his brother, cousin, and paternal grandmother; Lung cancer in his maternal grandfather. There is no history of Colon polyps, Esophageal cancer, Rectal cancer, or Stomach cancer.  ROS:   Please see the history of present illness.     All other systems reviewed and are negative.  EKGs/Labs/Other Studies Reviewed:    The following studies were reviewed today:   EKG: July 30, 2019: Sinus bradycardia 54 beats a minute.  QT corrected is 422 ms.  Incomplete right bundle branch block.   Recent Labs: 06/15/2019: ALT 25 07/10/2019: TSH 1.802 07/19/2019: Hemoglobin 16.7; Platelets 291 07/22/2019: BUN 15; Creatinine, Ser 1.10; Magnesium 2.3; Potassium 4.3; Sodium 136  Recent Lipid Panel    Component Value Date/Time   CHOL 130 06/15/2019 0834   TRIG 68.0 06/15/2019 0834   HDL 45.10 06/15/2019 0834   CHOLHDL 3 06/15/2019 0834   VLDL 13.6 06/15/2019 0834   LDLCALC 71 06/15/2019 0834    Physical Exam:    VS:  Pulse (!) 56   Ht 5\' 11"  (1.803 m)   Wt 199 lb (90.3 kg)   BMI 27.75 kg/m     Wt Readings  from Last 3 Encounters:  07/30/19  199 lb (90.3 kg)  07/22/19 192 lb 6.4 oz (87.3 kg)  07/10/19 195 lb (88.5 kg)     GEN:  Well nourished, well developed in no acute distress HEENT: Normal NECK: No JVD; No carotid bruits LYMPHATICS: No lymphadenopathy CARDIAC: RRR, no murmurs, rubs, gallops RESPIRATORY:  Clear to auscultation without rales, wheezing or rhonchi  ABDOMEN: Soft, non-tender, non-distended MUSCULOSKELETAL:  No edema; No deformity  SKIN: Warm and dry NEUROLOGIC:  Alert and oriented x 3 PSYCHIATRIC:  Normal affect   ASSESSMENT:    1. PAT (paroxysmal atrial tachycardia) (HCC)   2. Chest pain of uncertain etiology   3. Coronary artery disease involving coronary bypass graft of native heart without angina pectoris   4. Essential hypertension    PLAN:    In order of problems listed above:  1. Chest discomfort: Terry Hansen has a history of coronary artery disease and coronary artery bypass grafting.  He now presents with chest achiness.  He had some chest pain on Wednesday which was partially relieved with nitroglycerin.  He had some chest pain earlier today that resolved on its own.  He has a history of CAD but did not have angina prior to his bypass grafting.  He had more migraine headache symptoms.  We will start him on isosorbide 30 mg a day.  We will schedule him for a Lexiscan Myoview study.  We will have him follow-up and see Dr. Johnsie Cancel  in the office-already scheduled for February 3.  Of advised him to go to the ER/call EMS if he has recurrent episodes of chest pain then or not relieved with nitroglycerin.  2.  Atrial tachycardia: He is on sotalol.  His QTC looks good.  Continue current dose of sotalol.  3.  Hypertension: Blood pressure is elevated today.  He admits to eating lots of salty foods.  Will cut back on his salt intake.   Medication Adjustments/Labs and Tests Ordered: Current medicines are reviewed at length with the patient today.  Concerns regarding medicines are outlined above.  Orders  Placed This Encounter  Procedures  . MYOCARDIAL PERFUSION IMAGING  . EKG 12-Lead   Meds ordered this encounter  Medications  . isosorbide mononitrate (IMDUR) 30 MG 24 hr tablet    Sig: Take 1 tablet (30 mg total) by mouth daily.    Dispense:  90 tablet    Refill:  3    Patient Instructions  Medication Instructions:  Your physician has recommended you make the following change in your medication:  START Isosorbide mononitrate (Imdur) 30 mg once daily  *If you need a refill on your cardiac medications before your next appointment, please call your pharmacy*  Lab Work: None Ordered   Testing/Procedures: Your physician has requested that you have a lexiscan myoview. For further information please visit [email or web address]. Please follow instruction sheet, as given.    Follow-Up: At [facility] you and your health needs are our priority.  As part of our continuing mission to provide you with exceptional heart care, we have created designated Provider Care Teams.  These Care Teams include your primary Cardiologist (physician) and Advanced Practice Providers (APPs -  Physician Assistants and Nurse Practitioners) who all work together to provide you with the care you need, when you need it.  Your next appointment:   1 month(s) on February 3  The format for your next appointment:   In Person  Provider:   Jenkins Rouge, MD  Signed, Mertie Moores, MD  07/30/2019 5:47 PM    [place]

## 2019-07-30 NOTE — Patient Instructions (Signed)
Medication Instructions:  Your physician has recommended you make the following change in your medication:  START Isosorbide mononitrate (Imdur) 30 mg once daily  *If you need a refill on your cardiac medications before your next appointment, please call your pharmacy*  Lab Work: None Ordered   Testing/Procedures: Your physician has requested that you have a lexiscan myoview. For further information please visit [email or web address]. Please follow instruction sheet, as given.    Follow-Up: At [facility], you and your health needs are our priority.  As part of our continuing mission to provide you with exceptional heart care, we have created designated Provider Care Teams.  These Care Teams include your primary Cardiologist (physician) and Advanced Practice Providers (APPs -  Physician Assistants and Nurse Practitioners) who all work together to provide you with the care you need, when you need it.  Your next appointment:   1 month(s) on February 3  The format for your next appointment:   In Person  Provider:   Jenkins Rouge, MD

## 2019-07-31 LAB — COMPREHENSIVE METABOLIC PANEL
ALT: 19 IU/L (ref 0–44)
AST: 14 IU/L (ref 0–40)
Albumin/Globulin Ratio: 1.8 (ref 1.2–2.2)
Albumin: 4.1 g/dL (ref 3.8–4.8)
Alkaline Phosphatase: 137 IU/L — ABNORMAL HIGH (ref 39–117)
BUN/Creatinine Ratio: 16 (ref 10–24)
BUN: 15 mg/dL (ref 8–27)
Bilirubin Total: 0.5 mg/dL (ref 0.0–1.2)
CO2: 25 mmol/L (ref 20–29)
Calcium: 9.5 mg/dL (ref 8.6–10.2)
Chloride: 100 mmol/L (ref 96–106)
Creatinine, Ser: 0.91 mg/dL (ref 0.76–1.27)
GFR calc Af Amer: 102 mL/min/{1.73_m2} (ref >59)
GFR calc non Af Amer: 88 mL/min/{1.73_m2} (ref >59)
Globulin, Total: 2.3 g/dL (ref 1.5–4.5)
Glucose: 86 mg/dL (ref 65–99)
Potassium: 5 mmol/L (ref 3.5–5.2)
Sodium: 138 mmol/L (ref 134–144)
Total Protein: 6.4 g/dL (ref 6.0–8.5)

## 2019-07-31 LAB — MAGNESIUM: Magnesium: 2 mg/dL (ref 1.6–2.3)

## 2019-08-02 ENCOUNTER — Telehealth: Payer: Self-pay | Admitting: Cardiovascular Disease

## 2019-08-02 NOTE — Telephone Encounter (Signed)
New Message  Pt c/o medication issue:  1. Name of Medication: isosorbide mononitrate (IMDUR) 30 MG 24 hr tablet  2. How are you currently taking this medication (dosage and times per day)? As written  3. Are you having a reaction (difficulty breathing--STAT)? No  4. What is your medication issue? Receives a really bad headache when he takes the medication

## 2019-08-02 NOTE — Telephone Encounter (Signed)
Pt reports he started Imdur 30mg  BID two days ago.  He has experienced headaches both days and wondering if it is med. He did not take anything for the headache as he wasn't sure if it was ok.   Advised common side effect from imdur, ok to take OTC pain medication for.  Also recommended trying to cut dosage in half to work up tolerance.  He will try just half a tab BID for a couple of days before going back to full tab and take OTC pain med if headache occurs.

## 2019-08-02 NOTE — Telephone Encounter (Signed)
His dose of Imdur was prescribed as 30 mg QD ( not BID) Headache is a frequent side effect of IMdur Cont. Tylenol or NSAID as needed .  OK also to cut the tablet in half and take 1/2 once daily ( not BID)  Is scheduled for myoview soon  He belongs to Dr. Johnsie Cancel and will follow up with him

## 2019-08-02 NOTE — Telephone Encounter (Signed)
Spoke with pt, advised of MD recommendation to try 1/2 tab daily.  After a couple of days if he is tolerating he will try to increase to full tab.

## 2019-08-04 ENCOUNTER — Telehealth: Payer: Self-pay | Admitting: Cardiovascular Disease

## 2019-08-04 NOTE — Telephone Encounter (Signed)
I spoke to the patient who called because his BP continues to be elevated and he is experiencing CP and headaches (he will take Tylenol to control).  He made some adjustment on his Imdur on the 11th, taking 15 mg daily, but took 1 tablet 30 mg as prescribed this morning at 9:00.  His CP has subsided the past few days, but BP was still elevated at 11:15 180/79 HR 49.    He has a Myoview scheduled 1/19.  He will continue with the Imdur 30 mg daily and keep [place] updated on progress.

## 2019-08-04 NOTE — Telephone Encounter (Signed)
New message   Pt c/o BP issue: STAT if pt c/o blurred vision, one-sided weakness or slurred speech  1. What are your last 5 BP readings? 168/90 59 hr 180/79 hr 49   2. Are you having any other symptoms (ex. Dizziness, headache, blurred vision, passed out)? Dizziness, lighheaded, headache   3. What is your BP issue? B/p is elevated

## 2019-08-06 ENCOUNTER — Telehealth (HOSPITAL_COMMUNITY): Payer: Self-pay | Admitting: *Deleted

## 2019-08-06 NOTE — Telephone Encounter (Signed)
Close encounter 

## 2019-08-09 ENCOUNTER — Telehealth: Payer: Self-pay

## 2019-08-09 NOTE — Telephone Encounter (Signed)
Attempted to call patient. LMTCB 08/09/2019   

## 2019-08-09 NOTE — Telephone Encounter (Signed)
Reviewed result with patient. He agreed to reach out to PCP for further guidance. No further orders at this time.   Advised pt to call for any further questions or concerns.

## 2019-08-09 NOTE — Telephone Encounter (Signed)
-----  Message from [name], MD sent at 08/07/2019  8:59 PM EST ----- Please Inform Patient that his alk phos  is abnormal and will require followup with  His PCP-- I am not sure who ordered the lab  Thanks

## 2019-08-10 ENCOUNTER — Encounter (HOSPITAL_COMMUNITY): Payer: Medicare Other

## 2019-08-10 ENCOUNTER — Other Ambulatory Visit: Payer: Self-pay

## 2019-08-10 ENCOUNTER — Ambulatory Visit (HOSPITAL_COMMUNITY)
Admission: RE | Admit: 2019-08-10 | Discharge: 2019-08-10 | Disposition: A | Discharge status: Home / Self Care | Payer: Medicare Other | Source: Ambulatory Visit | Attending: Cardiovascular Disease | Admitting: Cardiovascular Disease

## 2019-08-10 DIAGNOSIS — I471 Supraventricular tachycardia: Secondary | ICD-10-CM | POA: Insufficient documentation

## 2019-08-10 DIAGNOSIS — I25810 Atherosclerosis of coronary artery bypass graft(s) without angina pectoris: Secondary | ICD-10-CM | POA: Diagnosis not present

## 2019-08-10 DIAGNOSIS — I10 Essential (primary) hypertension: Secondary | ICD-10-CM | POA: Insufficient documentation

## 2019-08-10 DIAGNOSIS — R079 Chest pain, unspecified: Secondary | ICD-10-CM | POA: Insufficient documentation

## 2019-08-10 LAB — MYOCARDIAL PERFUSION IMAGING
LV dias vol: 121 mL (ref 62–150)
LV sys vol: 60 mL
Peak HR: 82 {beats}/min
Rest HR: 52 {beats}/min
SDS: 2
SRS: 5
SSS: 7
TID: 1.01

## 2019-08-10 MED ORDER — REGADENOSON 0.4 MG/5ML IV SOLN
0.4000 mg | Freq: Once | INTRAVENOUS | Status: AC
  Administered 2019-08-10: 0.4 mg via INTRAVENOUS

## 2019-08-10 MED ORDER — TECHNETIUM TC 99M TETROFOSMIN IV KIT
31.4000 | PACK | Freq: Once | INTRAVENOUS | Status: AC | PRN
  Administered 2019-08-10: 31.4 via INTRAVENOUS
  Filled 2019-08-10: qty 32

## 2019-08-10 MED ORDER — TECHNETIUM TC 99M TETROFOSMIN IV KIT
10.4000 | PACK | Freq: Once | INTRAVENOUS | Status: AC | PRN
  Administered 2019-08-10: 10.4 via INTRAVENOUS
  Filled 2019-08-10: qty 11

## 2019-08-12 ENCOUNTER — Other Ambulatory Visit: Payer: Self-pay

## 2019-08-12 ENCOUNTER — Other Ambulatory Visit: Payer: Self-pay | Admitting: Adult Health

## 2019-08-12 ENCOUNTER — Ambulatory Visit (INDEPENDENT_AMBULATORY_CARE_PROVIDER_SITE_OTHER): Payer: Medicare Other | Admitting: Cardiology

## 2019-08-12 ENCOUNTER — Encounter: Payer: Self-pay | Admitting: Cardiology

## 2019-08-12 ENCOUNTER — Telehealth: Payer: Self-pay | Admitting: Adult Health

## 2019-08-12 VITALS — BP 162/92 | HR 59 | Ht 71.0 in | Wt 197.8 lb

## 2019-08-12 DIAGNOSIS — R079 Chest pain, unspecified: Secondary | ICD-10-CM

## 2019-08-12 DIAGNOSIS — I471 Supraventricular tachycardia: Secondary | ICD-10-CM

## 2019-08-12 DIAGNOSIS — I200 Unstable angina: Secondary | ICD-10-CM | POA: Diagnosis not present

## 2019-08-12 DIAGNOSIS — F32A Depression, unspecified: Secondary | ICD-10-CM

## 2019-08-12 DIAGNOSIS — I10 Essential (primary) hypertension: Secondary | ICD-10-CM

## 2019-08-12 DIAGNOSIS — F329 Major depressive disorder, single episode, unspecified: Secondary | ICD-10-CM

## 2019-08-12 MED ORDER — CITALOPRAM HYDROBROMIDE 10 MG PO TABS: 10.0000 mg | ORAL_TABLET | Freq: Every day | ORAL | 1 refills | Status: DC

## 2019-08-12 NOTE — Telephone Encounter (Signed)
Patient called back and stated that he has to be able to get this medication filled by 12:00 tomorrow.  He is having a procedure done on Monday so after his Covid test tomorrow he has to quarantine the rest of the day and weekend.

## 2019-08-12 NOTE — Telephone Encounter (Addendum)
Patient would like a refill on his citalopram (CELEXA) 10 MG tablet medication and have it sent to his preferred pharmacy [facility] on [address] from this day forward. Patient only has two tablets left of this medication.

## 2019-08-12 NOTE — Telephone Encounter (Signed)
One of his liver enzymes was slightly abnormal. It was normal back in November. After his procedure he can follow up and we will retest

## 2019-08-12 NOTE — Telephone Encounter (Signed)
Please advise 

## 2019-08-12 NOTE — Patient Instructions (Signed)
Medication Instructions:   Your physician has recommended you make the following change in your medication:   1) Increase Losartan to 100MG , once a day  *If you need a refill on your cardiac medications before your next appointment, please call your pharmacy*  Lab Work:  You will have labs drawn today: CBC/BMET  If you have labs (blood work) drawn today and your tests are completely normal, you will receive your results only by: Marland Kitchen MyChart Message (if you have MyChart) OR . A paper copy in the mail If you have any lab test that is abnormal or we need to change your treatment, we will call you to review the results.  Testing/Procedures:  Your physician has requested that you have a cardiac catheterization. Cardiac catheterization is used to diagnose and/or treat various heart conditions. Doctors may recommend this procedure for a number of different reasons. The most common reason is to evaluate chest pain. Chest pain can be a symptom of coronary artery disease (CAD), and cardiac catheterization can show whether plaque is narrowing or blocking your heart's arteries. This procedure is also used to evaluate the valves, as well as measure the blood flow and oxygen levels in different parts of your heart. For further information please visit HugeFiesta.[email or web address]. Please follow instruction sheet, as given.  Your Pre-procedure COVID-19 Testing will be done on 08/13/19 at 2:25 at [facility], [place]. After your swab you will be given a mask to wear and instructed to go home and quarantine/no visitors until after your procedure. If you test positive you will be notified and your procedure will be cancelled.    Follow-Up:  As scheduled with Dr. Johnsie Cancel

## 2019-08-12 NOTE — Progress Notes (Signed)
Cardiology Office Note   Date:  08/12/2019   ID:  Terry Hansen, DOB [birth year], MRN [id number]  PCP:  Terry Peng, NP  Cardiologist: Dr. Johnsie Hansen   Chief Complaint  Patient presents with  . Follow-up      History of Present Illness: Terry Hansen is a 64 y.o. male who presents for post stress test follow-up, seen for Dr. Johnsie Hansen   He has a hx of CAD s/p CABG 2013, patent grafts in 2015, hypertension, hyperlipidemia and PAT.   [name] has a history of CABG in 2013 with LIMA to LAD/diagonal. Last cardiac catheterization 06/03/2014 showed patent grafts.  Chest pain at that time was considered noncardiac.  The patient also has a history of atrial tachycardia, followed by Dr. Curt Hansen, on Cardizem.  He previously had fatigue on beta-blockers.  Patient had nuclear stress tests in 10/2015 and 11/2017, both showing no ischemia. Last echocardiogram 09/2015 showed normal LVEF 60-65%, no regional wall motion abnormalities, mild aortic regurgitation, mild aortic stenosis, mild mitral regurgitation.  He was most recently seen during hospitalization 06/2019 for palpitations/atrial tachycardia. Previously seen by Dr. Curt Hansen 02/2019 and switched from flecainide to [facility] with h/o CAD. Prior to hospitalization, he called with c/o palpitations, lightheadedness and SOB. They were waxing and waning so he was initially scheduled for evaluation in the office 07/20/2019. He was encouraged to take an extra dose of diltiazem 30 mg as needed. His symptoms persisted and he did not feel like he could wait any longer, so presented to [facility].  Plan was initially for Tikosyn as a bridge to ablation however was cost prohibitive therefore sotalol was initiated   Patient reports that for approximately 6 months he has noticed an increase in his fatigue however was attributing this to his atrial tachycardia.  As above, was hospitalized 06/2019 at which time sotalol was initiated.  He reports his PAT is been very  well controlled however has noticed increasing exertional chest pain.  This was noted during hospitalization therefore an outpatient Lexiscan stress test was scheduled.   Lexiscan performed 08/10/2019 found to have no ST segment deviation however findings consistent with prior MI with periinfarct ischemia noted to be an intermediate risk study given new peri-infarct ischemia from last exam and mild decrease in LVEF at 50%.  Medium size severe perfusion defect in anterior wall at the mid ventricle and anterior, lateral, and inferior walls at the apex as well as the apical cap.  The mid anterior wall severe perfusion defect is partially reversible to mild, and the anterior apical, lateral apical and inferior apical as well as true apex defects were fixed.  Plan is to follow-up today for cardiac catheterization for further ischemic evaluation.  Patient is chest pain-free today.  We discussed stress test results in depth as well as LHC risks and benefits.  Patient is agreeable for cardiac catheterization given his symptoms for further ischemic evaluation.  He denies shortness of breath, palpitations, LE edema, orthopnea, PND or syncope.  We will plan for lab work today.  Covid testing tomorrow 08/13/2019 with quarantining over the weekend and cath on Monday, 08/16/2019.  BP is quite elevated today at 162/92 with a BP log showing persistently elevated BPs therefore we will increase his losartan to 100 mg p.o. daily.  Past Medical History:  Diagnosis Date  . CAD (coronary artery disease)    a.  LHC 06/26/12:  pLAD 90%, prox/mid/dist CFS 20-30%, oRCA 100% (non dom), EF 55%;  b. Echo 12/13:  EF 55-60%, Gr 1 diast dysfn;  c. s/p CABG Terry Hansen) 12/13: L-LAD/Dx    . GERD (gastroesophageal reflux disease)   . HLD (hyperlipidemia)   . Hypertension   . Ocular migraine    "maybe once q couple months" (06/02/2014)  . Unstable angina pectoris [facility])     Past Surgical History:  Procedure Laterality Date  .  APPENDECTOMY  1970's  . CARDIAC CATHETERIZATION  06/2012  . CORONARY ARTERY BYPASS GRAFT  06/29/2012   Procedure: CORONARY ARTERY BYPASS GRAFTING (CABG);  Surgeon: Melrose Nakayama, MD;  Location: [facility];  Service: Open Heart Surgery;  Laterality: N/A;  times two using Left Internal Mammary Artery  . INGUINAL HERNIA REPAIR Bilateral [phone]  . LEFT HEART CATHETERIZATION WITH CORONARY ANGIOGRAM N/A 06/26/2012   Procedure: LEFT HEART CATHETERIZATION WITH CORONARY ANGIOGRAM;  Surgeon: Josue Hector, MD;  Location: [facility] CATH LAB;  Service: Cardiovascular;  Laterality: N/A;  . LEFT HEART CATHETERIZATION WITH CORONARY/GRAFT ANGIOGRAM N/A 06/03/2014   Procedure: LEFT HEART CATHETERIZATION WITH Beatrix Fetters;  Surgeon: Jettie Booze, MD;  Location: [facility] CATH LAB;  Service: Cardiovascular;  Laterality: N/A;  . NASAL SEPTUM SURGERY     For uncontrolled epistaxis early 2013  . SIGMOIDOSCOPY  10-15 years ago   in [place], [place]     Current Outpatient Medications  Medication Sig Dispense Refill  . aspirin EC 81 MG tablet Take 1 tablet (81 mg total) by mouth daily.    Marland Kitchen atorvastatin (LIPITOR) 40 MG tablet TAKE 1 TABLET(40 MG) BY MOUTH AT BEDTIME 90 tablet 3  . citalopram (CELEXA) 10 MG tablet Take 1 tablet (10 mg total) by mouth daily. 90 tablet 1  . Coenzyme Q10 200 MG capsule Take 200 mg by mouth daily with breakfast.     . diltiazem (CARDIZEM) 30 MG tablet Take 1 tablet (30 mg total) by mouth 4 (four) times daily as needed (elevated heart rates). 30 tablet 3  . fluticasone (FLONASE) 50 MCG/ACT nasal spray Place 2 sprays into both nostrils as needed for allergies or rhinitis.    Marland Kitchen ibuprofen (ADVIL,MOTRIN) 200 MG tablet Take 600 mg by mouth every 6 (six) hours as needed (for migraines).     . isosorbide mononitrate (IMDUR) 30 MG 24 hr tablet Take 1 tablet (30 mg total) by mouth daily. 90 tablet 3  . losartan (COZAAR) 50 MG tablet Take 50 mg by mouth daily.    . nitroGLYCERIN  (NITROSTAT) 0.4 MG SL tablet Place 1 tablet (0.4 mg total) under the tongue every 5 (five) minutes as needed for chest pain. 25 tablet 3  . omeprazole (PRILOSEC OTC) 20 MG tablet Take 20 mg by mouth daily as needed (acid reflux).     . sotalol (BETAPACE) 120 MG tablet Take 1 tablet (120 mg total) by mouth every 12 (twelve) hours. 60 tablet 2   No current facility-administered medications for this visit.    Allergies:   Patient has no known allergies.    Social History:  The patient  reports that he has never smoked. He has never used smokeless tobacco. He reports current alcohol use. He reports that he does not use drugs.   Family History:  The patient's family history includes Colon cancer (age of onset: 8) in his maternal grandfather; Heart attack in his brother; Heart disease in his brother, cousin, and paternal grandmother; Lung cancer in his maternal grandfather.    ROS:  Please see the history of present illness. Otherwise, review of systems are positive  for none.   All other systems are reviewed and negative.    PHYSICAL EXAM: VS:  BP (!) 162/92   Pulse (!) 59   Ht 5\' 11"  (1.803 m)   Wt 197 lb 12.8 oz (89.7 kg)   SpO2 96%   BMI 27.59 kg/m  , BMI Body mass index is 27.59 kg/m.   General: Well developed, well nourished, NAD Neck: Negative for carotid bruits. No JVD Lungs:Clear to ausculation bilaterally. No wheezes, rales, or rhonchi. Breathing is unlabored. Cardiovascular: RRR with S1 S2. + murmurs Extremities: No edema.  DP pulses 2+ bilaterally Neuro: Alert and oriented. No focal deficits. No facial asymmetry. MAE spontaneously. Psych: Responds to questions appropriately with normal affect.     EKG:  EKG is not ordered today.  Recent Labs: 07/10/2019: TSH 1.802 07/19/2019: Hemoglobin 16.7; Platelets 291 07/30/2019: ALT 19; BUN 15; Creatinine, Ser 0.91; Magnesium 2.0; Potassium 5.0; Sodium 138    Lipid Panel    Component Value Date/Time   CHOL 130 06/15/2019 0834    TRIG 68.0 06/15/2019 0834   HDL 45.10 06/15/2019 0834   CHOLHDL 3 06/15/2019 0834   VLDL 13.6 06/15/2019 0834   LDLCALC 71 06/15/2019 0834      Wt Readings from Last 3 Encounters:  08/12/19 197 lb 12.8 oz (89.7 kg)  08/10/19 199 lb (90.3 kg)  07/30/19 199 lb (90.3 kg)      Other studies Reviewed: Additional studies/ records that were reviewed today include:   Lexiscan stress test 08/10/2019:   The left ventricular ejection fraction is mildly decreased (45-54%).  Nuclear stress EF: 50%.  There was no ST segment deviation noted during stress.  Findings consistent with prior myocardial infarction with peri-infarct ischemia.  This is an intermediate risk study given peri-infarct ischemia new from the last exam and mild decrease in ejection fraction.   Medium size severe perfusion defect in the anterior wall at the mid ventricle, and the anterior, lateral, and inferior walls at the apex, as well as the apical cap.  The mid anterior wall severe perfusion defect is partially reversible to mild, and the anteroapical, lateral apical, and inferoapical as well as true apex defects are fixed.  Compared to the prior study, there is evidence of peri-infarct ischemia in the mid anterior wall.    ASSESSMENT AND PLAN:  1.  Chest pain with history of CAD status post CABG 2013: -Has LIMA to LAD/D1 which were patent on last LHC 2015.  Myoview stress test 10/2015 with anterior MI no ischemia, EF 41% with subsequent echocardiogram showing normal LVEF.  Myoview stress test performed 12/05/2017 with normal EF at 62%. -Patient reports increasing exertional chest pain for which he related to his PAT however was set up for outpatient Lexiscan stress test at hospital discharge which showed a partially reversible severe perfusion defect in mid anterior wall -Given this, plan is for cardiac catheterization for further ischemic evaluation -Obtain CBC, BMET>> Covid testing tomorrow 08/13/2019 for planned  cath on Monday -Cardiac catheterization was discussed with the patient fully. The patient understands that risks include but are not limited to stroke (1 in 1000), death (1 in 79), kidney failure [usually temporary] (1 in 500), bleeding (1 in 200), allergic reaction [possibly serious] (1 in 200).  The patient understands and is willing to proceed.    2.  Atrial tachycardia: -Follows with EP>> reports no recent palpitations since sotalol initiation -Most recently was discharged for hospitalization 06/2019 in the setting of atrial tachycardia at which time sotalol  was initiated -Initially was on flecainide however given history of CAD plan was for transition to sotalol in the setting of increased frequency of PAT  3. HTN: -Elevated, 162/92 today with BP log showing persistently elevated BPs over the last 2 weeks -Increase losartan to 100 mg p.o. daily  4. HLD: -LDL, 71 -Continue statin   Current medicines are reviewed at length with the patient today.  The patient does not have concerns regarding medicines.  The following changes have been made: Increase losartan to 100 mg p.o. daily  Labs/ tests ordered today include: CBC, BMET No orders of the defined types were placed in this encounter.   Disposition:   FU with APP in 2 weeks  Signed, Kathyrn Drown, NP  08/12/2019 2:20 PM    [facility] [place], [place], [place]  [address] Phone: [phone]; Fax: [phone]

## 2019-08-12 NOTE — Telephone Encounter (Signed)
Patient wanted the provider to know that his 07/30/2019 labs with his cardiologist came out abnormal and he wants to know if there is anything he should be doing.

## 2019-08-13 ENCOUNTER — Other Ambulatory Visit: Payer: Self-pay

## 2019-08-13 ENCOUNTER — Other Ambulatory Visit (HOSPITAL_COMMUNITY)
Admission: RE | Admit: 2019-08-13 | Discharge: 2019-08-13 | Disposition: A | Discharge status: Home / Self Care | Payer: Medicare Other | Source: Ambulatory Visit | Attending: Cardiology | Admitting: Cardiology

## 2019-08-13 ENCOUNTER — Telehealth: Payer: Self-pay

## 2019-08-13 DIAGNOSIS — Z01812 Encounter for preprocedural laboratory examination: Secondary | ICD-10-CM | POA: Diagnosis not present

## 2019-08-13 DIAGNOSIS — Z20822 Contact with and (suspected) exposure to covid-19: Secondary | ICD-10-CM | POA: Insufficient documentation

## 2019-08-13 DIAGNOSIS — F419 Anxiety disorder, unspecified: Secondary | ICD-10-CM

## 2019-08-13 DIAGNOSIS — F329 Major depressive disorder, single episode, unspecified: Secondary | ICD-10-CM

## 2019-08-13 DIAGNOSIS — F32A Depression, unspecified: Secondary | ICD-10-CM

## 2019-08-13 LAB — BASIC METABOLIC PANEL WITH GFR
BUN/Creatinine Ratio: 12 (ref 10–24)
BUN: 11 mg/dL (ref 8–27)
CO2: 26 mmol/L (ref 20–29)
Calcium: 9.5 mg/dL (ref 8.6–10.2)
Chloride: 99 mmol/L (ref 96–106)
Creatinine, Ser: 0.95 mg/dL (ref 0.76–1.27)
GFR calc Af Amer: 97 mL/min/{1.73_m2}
GFR calc non Af Amer: 84 mL/min/{1.73_m2}
Glucose: 74 mg/dL (ref 65–99)
Potassium: 4.4 mmol/L (ref 3.5–5.2)
Sodium: 140 mmol/L (ref 134–144)

## 2019-08-13 LAB — CBC
Hematocrit: 45.4 % (ref 37.5–51.0)
Hemoglobin: 15.5 g/dL (ref 13.0–17.7)
MCH: 30 pg (ref 26.6–33.0)
MCHC: 34.1 g/dL (ref 31.5–35.7)
MCV: 88 fL (ref 79–97)
Platelets: 246 10*3/uL (ref 150–450)
RBC: 5.16 x10E6/uL (ref 4.14–5.80)
RDW: 12.4 % (ref 11.6–15.4)
WBC: 7 10*3/uL (ref 3.4–10.8)

## 2019-08-13 LAB — SARS CORONAVIRUS 2 (TAT 6-24 HRS): SARS Coronavirus 2: NEGATIVE

## 2019-08-13 MED ORDER — CITALOPRAM HYDROBROMIDE 10 MG PO TABS: 10.0000 mg | ORAL_TABLET | Freq: Every day | ORAL | 1 refills | Status: DC

## 2019-08-13 NOTE — Telephone Encounter (Signed)
Patient notified of update  and verbalized understanding. 

## 2019-08-13 NOTE — Telephone Encounter (Signed)
Patient need to schedule an ov for more refills. 

## 2019-08-13 NOTE — Telephone Encounter (Signed)
The patient has been notified of the lab results and verbalized understanding.  All questions (if any) were answered. Frederik Schmidt, RN 08/13/2019 2:30 PM

## 2019-08-13 NOTE — Telephone Encounter (Signed)
-----   Message from Tommie Raymond, NP sent at 08/13/2019  2:13 PM EST ----- Please let the patient know that his lab work is unremarkable with no abnormalities.  Great news.

## 2019-08-16 ENCOUNTER — Encounter (HOSPITAL_COMMUNITY)
Admission: RE | Disposition: A | Discharge status: Home / Self Care | Payer: Self-pay | Source: Home / Self Care | Attending: Cardiology

## 2019-08-16 ENCOUNTER — Ambulatory Visit: Payer: Medicare Other | Admitting: Cardiovascular Disease

## 2019-08-16 ENCOUNTER — Ambulatory Visit (HOSPITAL_COMMUNITY)
Admission: RE | Admit: 2019-08-16 | Discharge: 2019-08-16 | Disposition: A | Discharge status: Home / Self Care | Payer: Medicare Other | Attending: Cardiology | Admitting: Cardiology

## 2019-08-16 ENCOUNTER — Other Ambulatory Visit: Payer: Self-pay

## 2019-08-16 DIAGNOSIS — Z7901 Long term (current) use of anticoagulants: Secondary | ICD-10-CM | POA: Insufficient documentation

## 2019-08-16 DIAGNOSIS — R001 Bradycardia, unspecified: Secondary | ICD-10-CM | POA: Insufficient documentation

## 2019-08-16 DIAGNOSIS — I25119 Atherosclerotic heart disease of native coronary artery with unspecified angina pectoris: Secondary | ICD-10-CM

## 2019-08-16 DIAGNOSIS — Z951 Presence of aortocoronary bypass graft: Secondary | ICD-10-CM | POA: Insufficient documentation

## 2019-08-16 DIAGNOSIS — R9439 Abnormal result of other cardiovascular function study: Secondary | ICD-10-CM | POA: Diagnosis present

## 2019-08-16 DIAGNOSIS — I471 Supraventricular tachycardia: Secondary | ICD-10-CM | POA: Insufficient documentation

## 2019-08-16 DIAGNOSIS — Z79899 Other long term (current) drug therapy: Secondary | ICD-10-CM | POA: Diagnosis not present

## 2019-08-16 DIAGNOSIS — Z7982 Long term (current) use of aspirin: Secondary | ICD-10-CM | POA: Diagnosis not present

## 2019-08-16 HISTORY — PX: LEFT HEART CATH AND CORS/GRAFTS ANGIOGRAPHY: CATH118250

## 2019-08-16 SURGERY — LEFT HEART CATH AND CORS/GRAFTS ANGIOGRAPHY
Anesthesia: LOCAL

## 2019-08-16 MED ORDER — HEPARIN (PORCINE) IN NACL 1000-0.9 UT/500ML-% IV SOLN
INTRAVENOUS | Status: AC
  Filled 2019-08-16: qty 1000

## 2019-08-16 MED ORDER — FENTANYL CITRATE (PF) 100 MCG/2ML IJ SOLN
INTRAMUSCULAR | Status: DC | PRN
  Administered 2019-08-16: 25 ug via INTRAVENOUS

## 2019-08-16 MED ORDER — MIDAZOLAM HCL 2 MG/2ML IJ SOLN
INTRAMUSCULAR | Status: DC | PRN
  Administered 2019-08-16: 2 mg via INTRAVENOUS

## 2019-08-16 MED ORDER — SODIUM CHLORIDE 0.9 % WEIGHT BASED INFUSION
3.0000 mL/kg/h | INTRAVENOUS | Status: AC
  Administered 2019-08-16: 10:00:00 3 mL/kg/h via INTRAVENOUS

## 2019-08-16 MED ORDER — HEPARIN SODIUM (PORCINE) 1000 UNIT/ML IJ SOLN
INTRAMUSCULAR | Status: AC
  Filled 2019-08-16: qty 1

## 2019-08-16 MED ORDER — MIDAZOLAM HCL 2 MG/2ML IJ SOLN
INTRAMUSCULAR | Status: AC
  Filled 2019-08-16: qty 2

## 2019-08-16 MED ORDER — HEPARIN SODIUM (PORCINE) 1000 UNIT/ML IJ SOLN
INTRAMUSCULAR | Status: DC | PRN
  Administered 2019-08-16: 4500 [IU] via INTRAVENOUS

## 2019-08-16 MED ORDER — VERAPAMIL HCL 2.5 MG/ML IV SOLN
INTRAVENOUS | Status: DC | PRN
  Administered 2019-08-16: 10 mL via INTRA_ARTERIAL

## 2019-08-16 MED ORDER — SODIUM CHLORIDE 0.9% FLUSH: 3.0000 mL | INTRAVENOUS | Status: DC | PRN

## 2019-08-16 MED ORDER — HEPARIN (PORCINE) IN NACL 1000-0.9 UT/500ML-% IV SOLN
INTRAVENOUS | Status: DC | PRN
  Administered 2019-08-16 (×2): 500 mL

## 2019-08-16 MED ORDER — SODIUM CHLORIDE 0.9 % IV SOLN: 250.0000 mL | INTRAVENOUS | Status: DC | PRN

## 2019-08-16 MED ORDER — ASPIRIN 81 MG PO CHEW: 81.0000 mg | CHEWABLE_TABLET | ORAL | Status: DC

## 2019-08-16 MED ORDER — SODIUM CHLORIDE 0.9 % WEIGHT BASED INFUSION: 1.0000 mL/kg/h | INTRAVENOUS | Status: DC

## 2019-08-16 MED ORDER — LIDOCAINE HCL (PF) 1 % IJ SOLN
INTRAMUSCULAR | Status: AC
  Filled 2019-08-16: qty 30

## 2019-08-16 MED ORDER — IOHEXOL 350 MG/ML SOLN
INTRAVENOUS | Status: DC | PRN
  Administered 2019-08-16: 65 mL

## 2019-08-16 MED ORDER — FENTANYL CITRATE (PF) 100 MCG/2ML IJ SOLN
INTRAMUSCULAR | Status: AC
  Filled 2019-08-16: qty 2

## 2019-08-16 MED ORDER — SODIUM CHLORIDE 0.9% FLUSH: 3.0000 mL | Freq: Two times a day (BID) | INTRAVENOUS | Status: DC

## 2019-08-16 MED ORDER — LIDOCAINE HCL (PF) 1 % IJ SOLN
INTRAMUSCULAR | Status: DC | PRN
  Administered 2019-08-16: 2 mL

## 2019-08-16 MED ORDER — VERAPAMIL HCL 2.5 MG/ML IV SOLN
INTRAVENOUS | Status: AC
  Filled 2019-08-16: qty 2

## 2019-08-16 SURGICAL SUPPLY — 10 items
CATH INFINITI 5FR MULTPACK ANG (CATHETERS) ×2 IMPLANT
DEVICE RAD COMP TR BAND LRG (VASCULAR PRODUCTS) ×2 IMPLANT
GLIDESHEATH SLEND SS 6F .021 (SHEATH) ×2 IMPLANT
GUIDEWIRE INQWIRE 1.5J.035X260 (WIRE) IMPLANT
INQWIRE 1.5J .035X260CM (WIRE)
KIT HEART LEFT (KITS) ×2 IMPLANT
PACK CARDIAC CATHETERIZATION (CUSTOM PROCEDURE TRAY) ×2 IMPLANT
SHEATH PROBE COVER 6X72 (BAG) ×2 IMPLANT
TRANSDUCER W/STOPCOCK (MISCELLANEOUS) ×2 IMPLANT
TUBING CIL FLEX 10 FLL-RA (TUBING) ×2 IMPLANT

## 2019-08-16 NOTE — Interval H&P Note (Signed)
History and Physical Interval Note:  08/16/2019 11:08 AM  Terry Hansen  has presented today for surgery, with the diagnosis of Chest Pain with Abnormal Nuclear Stress Test.  The various methods of treatment have been discussed with the patient and family. After consideration of risks, benefits and other options for treatment, the patient has consented to  Procedure(s): LEFT HEART CATH AND CORS/GRAFTS ANGIOGRAPHY (N/A)  PERCUTANEOUS CORONARY INTERVENTION  as a surgical intervention.  The patient's history has been reviewed, patient examined, no change in status, stable for surgery.  I have reviewed the patient's chart and labs.  Questions were answered to the patient's satisfaction.    Cath Lab Visit (complete for each Cath Lab visit)  Clinical Evaluation Leading to the Procedure:   ACS: No.  Non-ACS:    Anginal Classification: CCS II  Anti-ischemic medical therapy: Minimal Therapy (1 class of medications)  Non-Invasive Test Results: Intermediate-risk stress test findings: cardiac mortality 1-3%/year  Prior CABG: Previous CABG   [name]

## 2019-08-16 NOTE — Discharge Instructions (Signed)
Radial Site Care  This sheet gives you information about how to care for yourself after your procedure. Your health care provider may also give you more specific instructions. If you have problems or questions, contact your health care provider. What can I expect after the procedure? After the procedure, it is common to have:  Bruising and tenderness at the catheter insertion area. Follow these instructions at home: Medicines  Take over-the-counter and prescription medicines only as told by your health care provider. Insertion site care  Follow instructions from your health care provider about how to take care of your insertion site. Make sure you: ? Wash your hands with soap and water before you change your bandage (dressing). If soap and water are not available, use hand sanitizer. ? Remove your dressing as told by your health care provider. In 24-48 hours  Check your insertion site every day for signs of infection. Check for: ? Redness, swelling, or pain. ? Fluid or blood. ? Pus or a bad smell. ? Warmth.  Do not take baths, swim, or use a hot tub until your health care provider approves.  You may shower 24-48 hours after the procedure, or as directed by your health care provider. ? Remove the dressing and gently wash the site with plain soap and water. ? Pat the area dry with a clean towel. ? Do not rub the site. That could cause bleeding.  Do not apply powder or lotion to the site. Activity   For 24 hours after the procedure, or as directed by your health care provider: ? Do not flex or bend the affected arm. ? Do not push or pull heavy objects with the affected arm. ? Do not drive yourself home from the hospital or clinic. You may drive 24 hours after the procedure unless your health care provider tells you not to. ? Do not operate machinery or power tools.  Do not lift anything that is heavier than 10 lb (4.5 kg), or the limit that you are told, until your health care  provider says that it is safe. For 4 days  Ask your health care provider when it is okay to: ? Return to work or school. ? Resume usual physical activities or sports. ? Resume sexual activity. General instructions  If the catheter site starts to bleed, raise your arm and put firm pressure on the site. If the bleeding does not stop, get help right away. This is a medical emergency.  If you went home on the same day as your procedure, a responsible adult should be with you for the first 24 hours after you arrive home.  Keep all follow-up visits as told by your health care provider. This is important. Contact a health care provider if:  You have a fever.  You have redness, swelling, or yellow drainage around your insertion site. Get help right away if:  You have unusual pain at the radial site.  The catheter insertion area swells very fast.  The insertion area is bleeding, and the bleeding does not stop when you hold steady pressure on the area.  Your arm or hand becomes pale, cool, tingly, or numb. These symptoms may represent a serious problem that is an emergency. Do not wait to see if the symptoms will go away. Get medical help right away. Call your local emergency services (911 in the [place] Do not drive yourself to the hospital. Summary  After the procedure, it is common to have bruising and tenderness at the  site.  Follow instructions from your health care provider about how to take care of your radial site wound. Check the wound every day for signs of infection.  Do not lift anything that is heavier than 10 lb (4.5 kg), or the limit that you are told, until your health care provider says that it is safe. This information is not intended to replace advice given to you by your health care provider. Make sure you discuss any questions you have with your health care provider. Document Revised: 08/13/2017 Document Reviewed: 08/13/2017 Elsevier Patient Education  2020 [employer].

## 2019-08-18 ENCOUNTER — Telehealth: Payer: Self-pay | Admitting: Cardiology

## 2019-08-18 NOTE — Telephone Encounter (Signed)
LMTCB

## 2019-08-18 NOTE — Telephone Encounter (Signed)
After the first day, the best recommendation for having some swelling of the arm is to use a warm compress.  As long as he can still wiggle his hands and move his hands, the probably is in any acute worrisome issue.  If he has having issues with moving his hand, then we need to have him seen.  [name], MD

## 2019-08-18 NOTE — Telephone Encounter (Signed)
Pt called in reporting he developed mild swelling in his left hand and forearm after having cardiac cath on Monday. Area is not bruised or tender to touch per patient. No bleeding issues at site. He went home with an arm board and keep this on for 24 hrs. Small redden area to inside of forearm. No numbness or tingling in hand or fingers. Advised he should elevate arm and could use ice. If no improvement or worsened to call back and would need to be seen in the office to further assess site. He was agreeable and thanked me for the call back.

## 2019-08-18 NOTE — Telephone Encounter (Signed)
Patient called stating he had a cath on Monday went up his left arm, he is having some swelling. He states he arm feels weak, has a bruise forming in his forearm. He has put ice on it and has kept it elevated as directed, it has not improved.

## 2019-08-18 NOTE — Telephone Encounter (Signed)
Pt called to report that he had cath with radial access 08/16/19 and he is calling to c/o edema from his hand half way up his forearm.. some mild bruising, no pain, no redness or heat and no drainage or fever.   He has been elevating it and icing it but no improvement. He denies doing anything strenuous.   I advised him to continue to monitor and to call if anything changes or worsens.. I will forward to Dr. Ellyn Hack for review.

## 2019-08-19 NOTE — Progress Notes (Signed)
Patient ID: Terry Hansen, male   DOB: 1956-04-16, 64 y.o.   MRN: [id number]             64 y.o. CABG 2013 LIMA to LAD/Diagonal patent cath 06/03/14 CRF;s HTN, HLD. 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 Has son in [place] and brother in [place]   D/c from hospital on 07/22/19 with chest pain and palpitations had runs of atrial tachycardia and sotolol started , cardizem D/c due to bradycardia Myovue done 08/10/19 with EF 50% medium sized anterior infarct with peri infarct ischemia.   Cath 08/16/19 no targets for PCI, patent LIMA to D1/LAD large left dominant circumflex and non dominant RCA without significant disease     No Known Allergies   Current Outpatient Medications  Medication Sig Dispense Refill  . aspirin EC 81 MG tablet Take 1 tablet (81 mg total) by mouth daily.    Marland Kitchen atorvastatin (LIPITOR) 40 MG tablet TAKE 1 TABLET(40 MG) BY MOUTH AT BEDTIME (Patient taking differently: Take 40 mg by mouth at bedtime. ) 90 tablet 3  . citalopram (CELEXA) 10 MG tablet Take 1 tablet (10 mg total) by mouth daily. 90 tablet 1  . Coenzyme Q10 200 MG capsule Take 200 mg by mouth daily with breakfast.     . diltiazem (CARDIZEM) 30 MG tablet Take 1 tablet (30 mg total) by mouth 4 (four) times daily as needed (elevated heart rates). 30 tablet 3  . fluticasone (FLONASE) 50 MCG/ACT nasal spray Place 2 sprays into both nostrils as needed for allergies or rhinitis.    Marland Kitchen ibuprofen (ADVIL,MOTRIN) 200 MG tablet Take 400-600 mg by mouth every 6 (six) hours as needed for headache or moderate pain.     . isosorbide mononitrate (IMDUR) 30 MG 24 hr tablet Take 1 tablet (30 mg total) by  mouth daily. 90 tablet 3  . losartan (COZAAR) 50 MG tablet Take 100 mg by mouth daily.     . nitroGLYCERIN (NITROSTAT) 0.4 MG SL tablet Place 1 tablet (0.4 mg total) under the tongue every 5 (five) minutes as needed for chest pain. 25 tablet 3  . omeprazole (PRILOSEC OTC) 20 MG tablet Take 20 mg by mouth daily as needed (acid reflux).     . sotalol (BETAPACE) 120 MG tablet Take 1 tablet (120 mg total) by mouth every 12 (twelve) hours. 60 tablet 2   No current facility-administered medications for this visit.    Past Medical History:  Diagnosis Date  . CAD (coronary artery disease)    a.  LHC 06/26/12:  pLAD 90%, prox/mid/dist CFS 20-30%, oRCA 100% (non dom), EF 55%;  b. Echo 12/13:  EF 55-60%, Gr 1 diast dysfn;  c. s/p CABG Roxan Hockey) 12/13: L-LAD/Dx    . GERD (gastroesophageal reflux disease)   . HLD (hyperlipidemia)   . Hypertension   . Ocular migraine    "maybe once q couple months" (06/02/2014)  . Unstable angina pectoris [facility])     Past Surgical History:  Procedure Laterality Date  . APPENDECTOMY  1970's  . CARDIAC CATHETERIZATION  06/2012  . CORONARY ARTERY BYPASS GRAFT  06/29/2012   Procedure: CORONARY ARTERY BYPASS GRAFTING (CABG);  Surgeon: Melrose Nakayama, MD;  Location: [place];  Service: Open Heart Surgery;  Laterality: N/A;  times two using Left Internal Mammary Artery  . INGUINAL HERNIA REPAIR Bilateral [phone]  . LEFT HEART CATH AND CORS/GRAFTS ANGIOGRAPHY N/A 08/16/2019   Procedure: LEFT HEART CATH AND CORS/GRAFTS ANGIOGRAPHY;  Surgeon: Leonie Man, MD;  Location: [place] [facility];  Service: Cardiovascular;  Laterality: N/A;  . LEFT HEART CATHETERIZATION WITH CORONARY ANGIOGRAM N/A 06/26/2012   Procedure: LEFT HEART CATHETERIZATION WITH CORONARY ANGIOGRAM;  Surgeon: Josue Hector, MD;  Location: [facility] CATH LAB;  Service: Cardiovascular;  Laterality: N/A;  . LEFT HEART CATHETERIZATION WITH CORONARY/GRAFT ANGIOGRAM N/A 06/03/2014   Procedure: LEFT HEART  CATHETERIZATION WITH Beatrix Fetters;  Surgeon: Jettie Booze, MD;  Location: [facility] CATH LAB;  Service: Cardiovascular;  Laterality: N/A;  . NASAL SEPTUM SURGERY     For uncontrolled epistaxis early 2013  . SIGMOIDOSCOPY  10-15 years ago   in [place], [place]    Family History  Problem Relation Age of Onset  . Heart disease Brother        CABG age 96, redo bypass ~50  . Heart disease Paternal Grandmother   . Heart disease Cousin        Maternal side  . Heart attack Brother   . Colon cancer Maternal Grandfather 60       METS to colon from Lung CA per pt  . Lung cancer Maternal Grandfather   . Colon polyps Neg Hx   . Esophageal cancer Neg Hx   . Rectal cancer Neg Hx   . Stomach cancer Neg Hx     Social History   Socioeconomic History  . Marital status: Divorced    Spouse name: Not on file  . Number of children: Not on file  . Years of education: Not on file  . Highest education level: Not on file  Occupational History  . Occupation: retired  Tobacco Use  . Smoking status: Never Smoker  . Smokeless tobacco: Never Used  Substance and Sexual Activity  . Alcohol use: Yes    Comment: occ-Social per pt  . Drug use: No  . Sexual activity: Not Currently  Other Topics Concern  . Not on file  Social History Narrative   Divorced 7 years ago   3 children   Works in Aeronautical engineer   Social Determinants of Radio broadcast assistant Strain:   . Difficulty of Paying Living Expenses: Not on file  Food Insecurity:   . Worried About Charity fundraiser in the Last Year: Not on file  . Ran Out of Food in the Last Year: Not on file  Transportation Needs:   . Lack of Transportation (Medical): Not on file  . Lack of Transportation (Non-Medical): Not on file  Physical Activity:   . Days of Exercise per Week: Not on file  . Minutes of Exercise per Session: Not on file  Stress:   . Feeling of Stress : Not on file  Social Connections:   . Frequency of  Communication with Friends and Family: Not on file  . Frequency of Social Gatherings with Friends and Family: Not on file  . Attends Religious Services: Not on file  . Active Member of Clubs or Organizations: Not on file  . Attends Archivist Meetings: Not on file  . Marital Status: Not on file  Intimate Partner Violence:   . Fear of Current or Ex-Partner: Not on file  .  Emotionally Abused: Not on file  . Physically Abused: Not on file  . Sexually Abused: Not on file    ROS:  insomnia and anxiety mild GERD all other symptoms reviewed and negative   Vitals:   08/25/19 1504  BP: (!) 140/92  Pulse: 68  SpO2: 97%    Affect appropriate Healthy:  appears stated age HEENT: normal Neck supple with no adenopathy JVP normal no bruits no thyromegaly Lungs clear with no wheezing and good diaphragmatic motion Heart:  S1/S2 no murmur, no rub, gallop or click PMI normal Abdomen: benighn, BS positve, no tenderness, no AAA no bruit.  No HSM or HJR Distal pulses intact with no bruits No edema Neuro non-focal Skin warm and dry No muscular weakness     Wt Readings from Last 3 Encounters:  08/25/19 197 lb (89.4 kg)  08/16/19 196 lb (88.9 kg)  08/12/19 197 lb 12.8 oz (89.7 kg)    Lab Results  Component Value Date   WBC 7.0 08/12/2019   HGB 15.5 08/12/2019   HCT 45.4 08/12/2019   PLT 246 08/12/2019   GLUCOSE 74 08/12/2019   CHOL 130 06/15/2019   TRIG 68.0 06/15/2019   HDL 45.10 06/15/2019   LDLCALC 71 06/15/2019   ALT 19 07/30/2019   AST 14 07/30/2019   NA 140 08/12/2019   K 4.4 08/12/2019   CL 99 08/12/2019   CREATININE 0.95 08/12/2019   BUN 11 08/12/2019   CO2 26 08/12/2019   TSH 1.802 07/10/2019   PSA 0.97 06/15/2019   INR 1.05 10/19/2015   HGBA1C 5.4 04/08/2019    EKG: Normal sinus rhythm, no acute change  09/29/14     Impression:  CAD/CABG: LIMA LAD/D1 patent cath 08/16/19 with no significant disease in left dominant circumflex and  Small non  dominant RCA Continue medical Rx   ATrial Tachycardia: Sees [name] stable started on sotolol in hospital end of December 2020   HTN:  Well controlled.  Continue current medications and low sodium Dash type diet.     Cholesterol: at goal labs with primary   Lab Results  Component Value Date   CHOL 130 06/15/2019   HDL 45.10 06/15/2019   LDLCALC 71 06/15/2019   TRIG 68.0 06/15/2019   CHOLHDL 3 06/15/2019    Jenkins Rouge

## 2019-08-20 NOTE — Telephone Encounter (Signed)
Left detail message on voicemail . May use warm compression area . If become worse over the weekend recommend f/u at urgent care  otherwise discuss at appt with Dr Johnsie Cancel on 08/25/19

## 2019-08-25 ENCOUNTER — Other Ambulatory Visit: Payer: Self-pay

## 2019-08-25 ENCOUNTER — Encounter (INDEPENDENT_AMBULATORY_CARE_PROVIDER_SITE_OTHER): Payer: Self-pay

## 2019-08-25 ENCOUNTER — Ambulatory Visit (INDEPENDENT_AMBULATORY_CARE_PROVIDER_SITE_OTHER): Payer: Medicare Other | Admitting: Cardiovascular Disease

## 2019-08-25 ENCOUNTER — Encounter: Payer: Self-pay | Admitting: Cardiovascular Disease

## 2019-08-25 VITALS — BP 140/92 | HR 68 | Ht 71.0 in | Wt 197.0 lb

## 2019-08-25 DIAGNOSIS — I2510 Atherosclerotic heart disease of native coronary artery without angina pectoris: Secondary | ICD-10-CM

## 2019-08-25 DIAGNOSIS — I200 Unstable angina: Secondary | ICD-10-CM

## 2019-08-25 MED ORDER — AMLODIPINE BESYLATE 5 MG PO TABS: 5.0000 mg | ORAL_TABLET | Freq: Every day | ORAL | 3 refills | Status: DC

## 2019-08-25 MED ORDER — LOSARTAN POTASSIUM 100 MG PO TABS: 100.0000 mg | ORAL_TABLET | Freq: Every day | ORAL | 3 refills | Status: DC

## 2019-08-25 NOTE — Patient Instructions (Addendum)
Your physician has recommended you make the following change in your medication:  START AMLODIPINE 5 MG EVERY DAY     Your physician wants you to follow-up in: [place] will receive a reminder letter in the mail two months in advance. If you don't receive a letter, please call our office to schedule the follow-up appointment.

## 2019-09-02 ENCOUNTER — Other Ambulatory Visit: Payer: Self-pay

## 2019-09-03 ENCOUNTER — Encounter: Payer: Self-pay | Admitting: Adult Health

## 2019-09-03 ENCOUNTER — Ambulatory Visit (INDEPENDENT_AMBULATORY_CARE_PROVIDER_SITE_OTHER): Payer: Medicare Other | Admitting: Adult Health

## 2019-09-03 VITALS — BP 130/80 | HR 59 | Temp 97.5°F | Ht 71.0 in | Wt 196.0 lb

## 2019-09-03 DIAGNOSIS — I10 Essential (primary) hypertension: Secondary | ICD-10-CM | POA: Diagnosis not present

## 2019-09-03 DIAGNOSIS — I200 Unstable angina: Secondary | ICD-10-CM

## 2019-09-03 NOTE — Progress Notes (Signed)
Subjective:    Patient ID: Terry Hansen, male    DOB: 11/01/1955, 64 y.o.   MRN: [id number]  HPI 64 year old male who  has a past medical history of CAD (coronary artery disease), GERD (gastroesophageal reflux disease), HLD (hyperlipidemia), Hypertension, Ocular migraine, and Unstable angina pectoris ([place]).   He presents to the office today for concern of high blood pressure. His medications are prescribed by Cardiology and was recently placed on Norvasc 5 mg and started this on 08/26/2019. At home he was getting blood pressure readings in the 140-150's consistently. Yesterday his BP was 120's/80's. He was wondering if his cuff was not getting the right readings. He is asymptomatic.   BP on automatic cuff134/82 BP Manual 130/80   Review of Systems See HPI   Past Medical History:  Diagnosis Date  . CAD (coronary artery disease)    a.  LHC 06/26/12:  pLAD 90%, prox/mid/dist CFS 20-30%, oRCA 100% (non dom), EF 55%;  b. Echo 12/13:  EF 55-60%, Gr 1 diast dysfn;  c. s/p CABG Roxan Hockey) 12/13: L-LAD/Dx    . GERD (gastroesophageal reflux disease)   . HLD (hyperlipidemia)   . Hypertension   . Ocular migraine    "maybe once q couple months" (06/02/2014)  . Unstable angina pectoris [facility])     Social History   Socioeconomic History  . Marital status: Divorced    Spouse name: Not on file  . Number of children: Not on file  . Years of education: Not on file  . Highest education level: Not on file  Occupational History  . Occupation: retired  Tobacco Use  . Smoking status: Never Smoker  . Smokeless tobacco: Never Used  Substance and Sexual Activity  . Alcohol use: Yes    Comment: occ-Social per pt  . Drug use: No  . Sexual activity: Not Currently  Other Topics Concern  . Not on file  Social History Narrative   Divorced 7 years ago   3 children   Works in Aeronautical engineer   Social Determinants of Radio broadcast assistant Strain:   . Difficulty of Paying Living  Expenses: Not on file  Food Insecurity:   . Worried About Charity fundraiser in the Last Year: Not on file  . Ran Out of Food in the Last Year: Not on file  Transportation Needs:   . Lack of Transportation (Medical): Not on file  . Lack of Transportation (Non-Medical): Not on file  Physical Activity:   . Days of Exercise per Week: Not on file  . Minutes of Exercise per Session: Not on file  Stress:   . Feeling of Stress : Not on file  Social Connections:   . Frequency of Communication with Friends and Family: Not on file  . Frequency of Social Gatherings with Friends and Family: Not on file  . Attends Religious Services: Not on file  . Active Member of Clubs or Organizations: Not on file  . Attends Archivist Meetings: Not on file  . Marital Status: Not on file  Intimate Partner Violence:   . Fear of Current or Ex-Partner: Not on file  . Emotionally Abused: Not on file  . Physically Abused: Not on file  . Sexually Abused: Not on file    Past Surgical History:  Procedure Laterality Date  . APPENDECTOMY  1970's  . CARDIAC CATHETERIZATION  06/2012  . CORONARY ARTERY BYPASS GRAFT  06/29/2012   Procedure: CORONARY ARTERY BYPASS GRAFTING (CABG);  Surgeon: Melrose Nakayama, MD;  Location: [place];  Service: Open Heart Surgery;  Laterality: N/A;  times two using Left Internal Mammary Artery  . INGUINAL HERNIA REPAIR Bilateral [phone]  . LEFT HEART CATH AND CORS/GRAFTS ANGIOGRAPHY N/A 08/16/2019   Procedure: LEFT HEART CATH AND CORS/GRAFTS ANGIOGRAPHY;  Surgeon: Leonie Man, MD;  Location: [place] [facility];  Service: Cardiovascular;  Laterality: N/A;  . LEFT HEART CATHETERIZATION WITH CORONARY ANGIOGRAM N/A 06/26/2012   Procedure: LEFT HEART CATHETERIZATION WITH CORONARY ANGIOGRAM;  Surgeon: Josue Hector, MD;  Location: [facility] CATH LAB;  Service: Cardiovascular;  Laterality: N/A;  . LEFT HEART CATHETERIZATION WITH CORONARY/GRAFT ANGIOGRAM N/A 06/03/2014   Procedure: LEFT  HEART CATHETERIZATION WITH Beatrix Fetters;  Surgeon: Jettie Booze, MD;  Location: [facility] CATH LAB;  Service: Cardiovascular;  Laterality: N/A;  . NASAL SEPTUM SURGERY     For uncontrolled epistaxis early 2013  . SIGMOIDOSCOPY  10-15 years ago   in [place], [place]    Family History  Problem Relation Age of Onset  . Heart disease Brother        CABG age 48, redo bypass ~50  . Heart disease Paternal Grandmother   . Heart disease Cousin        Maternal side  . Heart attack Brother   . Colon cancer Maternal Grandfather 60       METS to colon from Lung CA per pt  . Lung cancer Maternal Grandfather   . Colon polyps Neg Hx   . Esophageal cancer Neg Hx   . Rectal cancer Neg Hx   . Stomach cancer Neg Hx     No Known Allergies  Current Outpatient Medications on File Prior to Visit  Medication Sig Dispense Refill  . amLODipine (NORVASC) 5 MG tablet Take 1 tablet (5 mg total) by mouth daily. 90 tablet 3  . aspirin EC 81 MG tablet Take 1 tablet (81 mg total) by mouth daily.    Marland Kitchen atorvastatin (LIPITOR) 40 MG tablet TAKE 1 TABLET(40 MG) BY MOUTH AT BEDTIME (Patient taking differently: Take 40 mg by mouth at bedtime. ) 90 tablet 3  . citalopram (CELEXA) 10 MG tablet Take 1 tablet (10 mg total) by mouth daily. 90 tablet 1  . Coenzyme Q10 200 MG capsule Take 200 mg by mouth daily with breakfast.     . diltiazem (CARDIZEM) 30 MG tablet Take 1 tablet (30 mg total) by mouth 4 (four) times daily as needed (elevated heart rates). 30 tablet 3  . fluticasone (FLONASE) 50 MCG/ACT nasal spray Place 2 sprays into both nostrils as needed for allergies or rhinitis.    Marland Kitchen ibuprofen (ADVIL,MOTRIN) 200 MG tablet Take 400-600 mg by mouth every 6 (six) hours as needed for headache or moderate pain.     . isosorbide mononitrate (IMDUR) 30 MG 24 hr tablet Take 1 tablet (30 mg total) by mouth daily. 90 tablet 3  . losartan (COZAAR) 100 MG tablet Take 1 tablet (100 mg total) by mouth daily. 90 tablet 3  .  nitroGLYCERIN (NITROSTAT) 0.4 MG SL tablet Place 1 tablet (0.4 mg total) under the tongue every 5 (five) minutes as needed for chest pain. 25 tablet 3  . omeprazole (PRILOSEC OTC) 20 MG tablet Take 20 mg by mouth daily as needed (acid reflux).     . sotalol (BETAPACE) 120 MG tablet Take 1 tablet (120 mg total) by mouth every 12 (twelve) hours. 60 tablet 2   No current facility-administered medications on  file prior to visit.    BP 130/80   Pulse (!) 59   Temp (!) 97.5 F (36.4 C) (Other (Comment))   Ht 5\' 11"  (1.803 m)   Wt 196 lb (88.9 kg)   SpO2 99%   BMI 27.34 kg/m       Objective:   Physical Exam Vitals and nursing note reviewed.  Constitutional:      Appearance: Normal appearance.  Cardiovascular:     Rate and Rhythm: Normal rate and regular rhythm.     Pulses: Normal pulses.     Heart sounds: Normal heart sounds.  Pulmonary:     Effort: Pulmonary effort is normal.     Breath sounds: Normal breath sounds.  Musculoskeletal:        General: Normal range of motion.  Skin:    General: Skin is warm and dry.  Neurological:     General: No focal deficit present.     Mental Status: He is alert.  Psychiatric:        Mood and Affect: Mood normal.        Behavior: Behavior normal.       Assessment & Plan:  1. Essential hypertension - BP controlled. No change in medications  - Follow up with cardiology as directed   [name], NP

## 2019-10-05 ENCOUNTER — Encounter: Payer: Self-pay | Admitting: Cardiology

## 2019-10-05 ENCOUNTER — Other Ambulatory Visit: Payer: Self-pay

## 2019-10-05 ENCOUNTER — Ambulatory Visit (INDEPENDENT_AMBULATORY_CARE_PROVIDER_SITE_OTHER): Payer: Medicare Other | Admitting: Cardiology

## 2019-10-05 VITALS — BP 142/86 | HR 56 | Ht 71.0 in | Wt 198.0 lb

## 2019-10-05 DIAGNOSIS — I200 Unstable angina: Secondary | ICD-10-CM | POA: Diagnosis not present

## 2019-10-05 DIAGNOSIS — I471 Supraventricular tachycardia: Secondary | ICD-10-CM

## 2019-10-05 NOTE — Progress Notes (Signed)
Electrophysiology Office Note   Date:  10/05/2019   ID:  Terry Hansen, DOB [birth year], MRN [id number]  PCP:  [name], NP  Cardiologist: Johnsie Cancel  Primary Electrophysiologist:  Will Meredith Leeds, MD    No chief complaint on file.    History of Present Illness: Terry Hansen is a 64 y.o. male who presents today for electrophysiology evaluation.   He presented to the hospital at the end of March with palpitations and was found to have an atrial tachycardia and multiple short runs of tachycardia. At that time, he was started on metoprolol 75 mg seems to control his symptoms. He re-presented to the hospital on 4/10 with similar symptoms.  His metoprolol was sent stopped and his diltiazem was increased due to fatigue.  Today, denies symptoms of palpitations, chest pain, shortness of breath, orthopnea, PND, lower extremity edema, claudication, dizziness, presyncope, syncope, bleeding, or neurologic sequela. The patient is tolerating medications without difficulties.  Overall he is doing well.  Since he has gotten stabilized on the sotalol he has not had any palpitations.  He is able to do all his daily activities without restriction.  Past Medical History:  Diagnosis Date  . CAD (coronary artery disease)    a.  LHC 06/26/12:  pLAD 90%, prox/mid/dist CFS 20-30%, oRCA 100% (non dom), EF 55%;  b. Echo 12/13:  EF 55-60%, Gr 1 diast dysfn;  c. s/p CABG Roxan Hockey) 12/13: L-LAD/Dx    . GERD (gastroesophageal reflux disease)   . HLD (hyperlipidemia)   . Hypertension   . Ocular migraine    "maybe once q couple months" (06/02/2014)  . Unstable angina pectoris [facility])    Past Surgical History:  Procedure Laterality Date  . APPENDECTOMY  1970's  . CARDIAC CATHETERIZATION  06/2012  . CORONARY ARTERY BYPASS GRAFT  06/29/2012   Procedure: CORONARY ARTERY BYPASS GRAFTING (CABG);  Surgeon: Melrose Nakayama, MD;  Location: [place];  Service: Open Heart Surgery;  Laterality: N/A;  times two  using Left Internal Mammary Artery  . INGUINAL HERNIA REPAIR Bilateral [phone]  . LEFT HEART CATH AND CORS/GRAFTS ANGIOGRAPHY N/A 08/16/2019   Procedure: LEFT HEART CATH AND CORS/GRAFTS ANGIOGRAPHY;  Surgeon: Leonie Man, MD;  Location: [place] CV LAB;  Service: Cardiovascular;  Laterality: N/A;  . LEFT HEART CATHETERIZATION WITH CORONARY ANGIOGRAM N/A 06/26/2012   Procedure: LEFT HEART CATHETERIZATION WITH CORONARY ANGIOGRAM;  Surgeon: Josue Hector, MD;  Location: [facility] CATH LAB;  Service: Cardiovascular;  Laterality: N/A;  . LEFT HEART CATHETERIZATION WITH CORONARY/GRAFT ANGIOGRAM N/A 06/03/2014   Procedure: LEFT HEART CATHETERIZATION WITH Beatrix Fetters;  Surgeon: Jettie Booze, MD;  Location: [facility] CATH LAB;  Service: Cardiovascular;  Laterality: N/A;  . NASAL SEPTUM SURGERY     For uncontrolled epistaxis early 2013  . SIGMOIDOSCOPY  10-15 years ago   in [place], [place]     Current Outpatient Medications  Medication Sig Dispense Refill  . amLODipine (NORVASC) 5 MG tablet Take 1 tablet (5 mg total) by mouth daily. 90 tablet 3  . aspirin EC 81 MG tablet Take 1 tablet (81 mg total) by mouth daily.    Marland Kitchen atorvastatin (LIPITOR) 40 MG tablet TAKE 1 TABLET(40 MG) BY MOUTH AT BEDTIME 90 tablet 3  . citalopram (CELEXA) 10 MG tablet Take 1 tablet (10 mg total) by mouth daily. 90 tablet 1  . Coenzyme Q10 200 MG capsule Take 200 mg by mouth daily with breakfast.     . diltiazem (CARDIZEM) 30 MG tablet  Take 1 tablet (30 mg total) by mouth 4 (four) times daily as needed (elevated heart rates). 30 tablet 3  . fluticasone (FLONASE) 50 MCG/ACT nasal spray Place 2 sprays into both nostrils as needed for allergies or rhinitis.    Marland Kitchen ibuprofen (ADVIL,MOTRIN) 200 MG tablet Take 400-600 mg by mouth every 6 (six) hours as needed for headache or moderate pain.     . isosorbide mononitrate (IMDUR) 30 MG 24 hr tablet Take 1 tablet (30 mg total) by mouth daily. 90 tablet 3  . losartan (COZAAR)  100 MG tablet Take 1 tablet (100 mg total) by mouth daily. 90 tablet 3  . nitroGLYCERIN (NITROSTAT) 0.4 MG SL tablet Place 1 tablet (0.4 mg total) under the tongue every 5 (five) minutes as needed for chest pain. 25 tablet 3  . omeprazole (PRILOSEC OTC) 20 MG tablet Take 20 mg by mouth daily as needed (acid reflux).     . sotalol (BETAPACE) 120 MG tablet Take 1 tablet (120 mg total) by mouth every 12 (twelve) hours. 60 tablet 2   No current facility-administered medications for this visit.    Allergies:   Patient has no known allergies.   Social History:  The patient  reports that he has never smoked. He has never used smokeless tobacco. He reports current alcohol use. He reports that he does not use drugs.   Family History:  The patient's family history includes Colon cancer (age of onset: 7) in his maternal grandfather; Heart attack in his brother; Heart disease in his brother, cousin, and paternal grandmother; Lung cancer in his maternal grandfather.    ROS:  Please see the history of present illness.   Otherwise, review of systems is positive for none.   All other systems are reviewed and negative.   PHYSICAL EXAM: VS:  BP (!) 142/86   Pulse (!) 56   Ht 5\' 11"  (1.803 m)   Wt 198 lb (89.8 kg)   SpO2 99%   BMI 27.62 kg/m  , BMI Body mass index is 27.62 kg/m. GEN: Well nourished, well developed, in no acute distress  HEENT: normal  Neck: no JVD, carotid bruits, or masses Cardiac: RRR; no murmurs, rubs, or gallops,no edema  Respiratory:  clear to auscultation bilaterally, normal work of breathing GI: soft, nontender, nondistended, + BS MS: no deformity or atrophy  Skin: warm and dry Neuro:  Strength and sensation are intact Psych: euthymic mood, full affect  EKG:  EKG is ordered today. Personal review of the ekg ordered shows sinus rhythm, rate 56  Recent Labs: 07/10/2019: TSH 1.802 07/30/2019: ALT 19; Magnesium 2.0 08/12/2019: BUN 11; Creatinine, Ser 0.95; Hemoglobin 15.5;  Platelets 246; Potassium 4.4; Sodium 140    Lipid Panel     Component Value Date/Time   CHOL 130 06/15/2019 0834   TRIG 68.0 06/15/2019 0834   HDL 45.10 06/15/2019 0834   CHOLHDL 3 06/15/2019 0834   VLDL 13.6 06/15/2019 0834   LDLCALC 71 06/15/2019 0834     Wt Readings from Last 3 Encounters:  10/05/19 198 lb (89.8 kg)  09/03/19 196 lb (88.9 kg)  08/25/19 197 lb (89.4 kg)      Other studies Reviewed: Additional studies/ records that were reviewed today include: LHC 08/16/19  Review of the above records today demonstrates:   Angiographically no change in coronary disease as noted in 2015-proximal-mid LAD 80 to 85% focal stenosis at branch point with D1 and SP1. ? This lesion could explain results from stress test.  Not PCI target because it would likely jeopardize both grafts without potentially benefiting the SP branch.  Widely patent LIMA-D1-LAD with retrograde filling to the original lesion  Separate ostium dominant Left Circumflex with mild AV groove disease.  Small nondominant RCA.  TTE 04/09/19 1. Left ventricular ejection fraction, by visual estimation, is 60 to  65%. The left ventricle has normal function. Normal left ventricular size.  There is no left ventricular hypertrophy.  2. Left ventricular diastolic Doppler parameters are consistent with  impaired relaxation pattern of LV diastolic filling.  3. Global right ventricle has normal systolic function.The right  ventricular size is normal. No increase in right ventricular wall  thickness.  4. Left atrial size was normal.  5. Right atrial size was normal.  6. The mitral valve is normal in structure. Mild mitral valve  regurgitation. No evidence of mitral stenosis.  7. The tricuspid valve is normal in structure. Tricuspid valve  regurgitation is mild.  8. The aortic valve is severely calcified, possibly bicuspid. Aortic  valve regurgitation is mild by color flow Doppler. Mild aortic valve  stenosis.   9. The pulmonic valve was normal in structure. Pulmonic valve  regurgitation is not visualized by color flow Doppler.  10. The inferior vena cava is normal in size with greater than 50%  respiratory variability, suggesting right atrial pressure of 3 mmHg.   ASSESSMENT AND PLAN:  1.  Atrial tachycardia: Currently on sotalol.  Stable on the sotalol he is done much better.  He has had no further palpitations.  He had a recent left heart catheterization that showed stable coronary artery disease.  We will make no further changes.  2.  Hypertension: Mildly elevated today but usually well controlled.  No changes.  3.  Coronary artery disease status post CABG: Ejection fraction is normal.  No current chest pain.  No changes.  Current medicines are reviewed at length with the patient today.   The patient does not have concerns regarding his medicines.  The following changes were made today: None  Labs/ tests ordered today include:  Orders Placed This Encounter  Procedures  . EKG 12-Lead     Disposition:   FU with Will [name] 6 months  Signed, Will Meredith Leeds, MD  10/05/2019 9:41 AM     [facility] [address] [place] [address] [phone] (office) [phone] (fax)

## 2019-10-05 NOTE — Patient Instructions (Signed)
Medication Instructions:  Your physician recommends that you continue on your current medications as directed. Please refer to the Current Medication list given to you today.  *If you need a refill on your cardiac medications before your next appointment, please call your pharmacy*   Lab Work: None ordered If you have labs (blood work) drawn today and your tests are completely normal, you will receive your results only by: . MyChart Message (if you have MyChart) OR . A paper copy in the mail If you have any lab test that is abnormal or we need to change your treatment, we will call you to review the results.   Testing/Procedures: None ordered   Follow-Up: At [facility], you and your health needs are our priority.  As part of our continuing mission to provide you with exceptional heart care, we have created designated Provider Care Teams.  These Care Teams include your primary Cardiologist (physician) and Advanced Practice Providers (APPs -  Physician Assistants and Nurse Practitioners) who all work together to provide you with the care you need, when you need it.  We recommend signing up for the patient portal called "MyChart".  Sign up information is provided on this After Visit Summary.  MyChart is used to connect with patients for Virtual Visits (Telemedicine).  Patients are able to view lab/test results, encounter notes, upcoming appointments, etc.  Non-urgent messages can be sent to your provider as well.   To learn more about what you can do with MyChart, go to [email or web address].    Your next appointment:   6 month(s)  The format for your next appointment:   In Person  Provider:   Will [name], MD   Thank you for choosing [facility]!!   Collis Thede, RN [phone]    Other Instructions    

## 2019-10-14 ENCOUNTER — Other Ambulatory Visit: Payer: Self-pay | Admitting: Nurse Practitioner

## 2019-12-21 ENCOUNTER — Other Ambulatory Visit: Payer: Self-pay

## 2019-12-21 ENCOUNTER — Ambulatory Visit (HOSPITAL_COMMUNITY)
Admission: EM | Admit: 2019-12-21 | Discharge: 2019-12-21 | Disposition: A | Discharge status: Home / Self Care | Payer: Medicare Other | Attending: Family Medicine | Admitting: Family Medicine

## 2019-12-21 ENCOUNTER — Encounter (HOSPITAL_COMMUNITY): Payer: Self-pay

## 2019-12-21 DIAGNOSIS — I2510 Atherosclerotic heart disease of native coronary artery without angina pectoris: Secondary | ICD-10-CM | POA: Diagnosis not present

## 2019-12-21 DIAGNOSIS — Z79899 Other long term (current) drug therapy: Secondary | ICD-10-CM | POA: Diagnosis not present

## 2019-12-21 DIAGNOSIS — E785 Hyperlipidemia, unspecified: Secondary | ICD-10-CM | POA: Insufficient documentation

## 2019-12-21 DIAGNOSIS — J0100 Acute maxillary sinusitis, unspecified: Secondary | ICD-10-CM | POA: Diagnosis not present

## 2019-12-21 DIAGNOSIS — Z951 Presence of aortocoronary bypass graft: Secondary | ICD-10-CM | POA: Insufficient documentation

## 2019-12-21 DIAGNOSIS — Z20822 Contact with and (suspected) exposure to covid-19: Secondary | ICD-10-CM | POA: Diagnosis not present

## 2019-12-21 DIAGNOSIS — K219 Gastro-esophageal reflux disease without esophagitis: Secondary | ICD-10-CM | POA: Diagnosis not present

## 2019-12-21 DIAGNOSIS — Z7982 Long term (current) use of aspirin: Secondary | ICD-10-CM | POA: Diagnosis not present

## 2019-12-21 DIAGNOSIS — I10 Essential (primary) hypertension: Secondary | ICD-10-CM | POA: Diagnosis not present

## 2019-12-21 DIAGNOSIS — J029 Acute pharyngitis, unspecified: Secondary | ICD-10-CM | POA: Diagnosis present

## 2019-12-21 MED ORDER — AZITHROMYCIN 250 MG PO TABS: ORAL_TABLET | ORAL | 0 refills | Status: DC

## 2019-12-21 NOTE — Discharge Instructions (Signed)
You have a sinus infection.  I have prescribed a z pack for you to take.  Follow up with this office or with primary care if you are not feeling better over the next 2 days.  Follow up with the ER for trouble swallowing, trouble breathing, other concerning symptoms.

## 2019-12-21 NOTE — ED Provider Notes (Signed)
[facility]   [id number] 12/21/19 Arrival Time: 1902  [id number] THROAT  SUBJECTIVE: History from: patient.  Terry Hansen is a 64 y.o. male who presents with abrupt onset of nasal congestion, headache, fatigue for the last week. Denies sick exposure to Covid, strep, flu or mono, or precipitating event. Reports L sided sinus pain and pressure. Has tried OTC cold medications without relief. There are no aggravating symptoms. Reports previous symptoms in the past.     Denies fever, chills, fatigue, ear pain, rhinorrhea, cough, SOB, wheezing, chest pain, nausea, rash, changes in bowel or bladder habits.     ROS: As per HPI.  All other pertinent ROS negative.     Past Medical History:  Diagnosis Date   CAD (coronary artery disease)    a.  LHC 06/26/12:  pLAD 90%, prox/mid/dist CFS 20-30%, oRCA 100% (non dom), EF 55%;  b. Echo 12/13:  EF 55-60%, Gr 1 diast dysfn;  c. s/p CABG [facility] Hockey) 12/13: L-LAD/Dx     GERD (gastroesophageal reflux disease)    HLD (hyperlipidemia)    Hypertension    Ocular migraine    "maybe once q couple months" (06/02/2014)   Unstable angina pectoris [facility])    Past Surgical History:  Procedure Laterality Date   APPENDECTOMY  1970's   CARDIAC CATHETERIZATION  06/2012   CORONARY ARTERY BYPASS GRAFT  06/29/2012   Procedure: CORONARY ARTERY BYPASS GRAFTING (CABG);  Surgeon: Melrose Nakayama, MD;  Location: [place];  Service: Open Heart Surgery;  Laterality: N/A;  times two using Left Internal Mammary Artery   INGUINAL HERNIA REPAIR Bilateral [phone]   LEFT HEART CATH AND CORS/GRAFTS ANGIOGRAPHY N/A 08/16/2019   Procedure: LEFT HEART CATH AND CORS/GRAFTS ANGIOGRAPHY;  Surgeon: Leonie Man, MD;  Location: [place] CV LAB;  Service: Cardiovascular;  Laterality: N/A;   LEFT HEART CATHETERIZATION WITH CORONARY ANGIOGRAM N/A 06/26/2012   Procedure: LEFT HEART CATHETERIZATION WITH CORONARY ANGIOGRAM;  Surgeon: Josue Hector, MD;  Location:  [facility] CATH LAB;  Service: Cardiovascular;  Laterality: N/A;   LEFT HEART CATHETERIZATION WITH CORONARY/GRAFT ANGIOGRAM N/A 06/03/2014   Procedure: LEFT HEART CATHETERIZATION WITH Beatrix Fetters;  Surgeon: Jettie Booze, MD;  Location: [facility] CATH LAB;  Service: Cardiovascular;  Laterality: N/A;   NASAL SEPTUM SURGERY     For uncontrolled epistaxis early 2013   SIGMOIDOSCOPY  10-15 years ago   in [place], [place]   No Known Allergies No current facility-administered medications on file prior to encounter.   Current Outpatient Medications on File Prior to Encounter  Medication Sig Dispense Refill   aspirin EC 81 MG tablet Take 1 tablet (81 mg total) by mouth daily.     atorvastatin (LIPITOR) 40 MG tablet TAKE 1 TABLET(40 MG) BY MOUTH AT BEDTIME 90 tablet 3   citalopram (CELEXA) 10 MG tablet Take 1 tablet (10 mg total) by mouth daily. 90 tablet 1   Coenzyme Q10 200 MG capsule Take 200 mg by mouth daily with breakfast.      diltiazem (CARDIZEM) 30 MG tablet Take 1 tablet (30 mg total) by mouth 4 (four) times daily as needed (elevated heart rates). 30 tablet 3   fluticasone (FLONASE) 50 MCG/ACT nasal spray Place 2 sprays into both nostrils as needed for allergies or rhinitis.     isosorbide mononitrate (IMDUR) 30 MG 24 hr tablet Take 1 tablet (30 mg total) by mouth daily. 90 tablet 3   omeprazole (PRILOSEC OTC) 20 MG tablet Take 20 mg by mouth daily as  needed (acid reflux).      sotalol (BETAPACE) 120 MG tablet TAKE 1 TABLET BY MOUTH EVERY 12 HOURS 180 tablet 3   amLODipine (NORVASC) 5 MG tablet Take 1 tablet (5 mg total) by mouth daily. 90 tablet 3   ibuprofen (ADVIL,MOTRIN) 200 MG tablet Take 400-600 mg by mouth every 6 (six) hours as needed for headache or moderate pain.      losartan (COZAAR) 100 MG tablet Take 1 tablet (100 mg total) by mouth daily. 90 tablet 3   nitroGLYCERIN (NITROSTAT) 0.4 MG SL tablet Place 1 tablet (0.4 mg total) under the tongue every 5 (five)  minutes as needed for chest pain. 25 tablet 3   Social History   Socioeconomic History   Marital status: Divorced    Spouse name: Not on file   Number of children: Not on file   Years of education: Not on file   Highest education level: Not on file  Occupational History   Occupation: retired  Tobacco Use   Smoking status: Never Smoker   Smokeless tobacco: Never Used  Substance and Sexual Activity   Alcohol use: Yes    Comment: occ-Social per pt   Drug use: No   Sexual activity: Not Currently  Other Topics Concern   Not on file  Social History Narrative   Divorced 7 years ago   3 children   Works in Aeronautical engineer   Social Determinants of Radio broadcast assistant Strain:    Difficulty of Paying Living Expenses:   Food Insecurity:    Worried About Charity fundraiser in the Last Year:    Arboriculturist in the Last Year:   Transportation Needs:    Film/video editor (Medical):    Lack of Transportation (Non-Medical):   Physical Activity:    Days of Exercise per Week:    Minutes of Exercise per Session:   Stress:    Feeling of Stress :   Social Connections:    Frequency of Communication with Friends and Family:    Frequency of Social Gatherings with Friends and Family:    Attends Religious Services:    Active Member of Clubs or Organizations:    Attends Music therapist:    Marital Status:   Intimate Partner Violence:    Fear of Current or Ex-Partner:    Emotionally Abused:    Physically Abused:    Sexually Abused:    Family History  Problem Relation Age of Onset   Heart disease Brother        CABG age 74, redo bypass ~50   Heart disease Paternal Grandmother    Heart disease Cousin        Maternal side   Heart attack Brother    Colon cancer Maternal Grandfather 60       METS to colon from Lung CA per pt   Lung cancer Maternal Grandfather    Colon polyps Neg Hx    Esophageal cancer Neg Hx      Rectal cancer Neg Hx    Stomach cancer Neg Hx     OBJECTIVE:  Vitals:   12/21/19 1941  BP: (!) 155/88  Pulse: 93  Resp: 18  Temp: 98.9 F (37.2 C)  TempSrc: Oral  SpO2: 98%     General appearance: alert; appears fatigued, but nontoxic, speaking in full sentences and managing own secretions HEENT: NCAT; Ears: EACs clear, TMs pearly gray with visible cone of light, without erythema; Eyes:  PERRL, EOMI grossly; Nose: no obvious rhinorrhea; Throat: oropharynx clear, tonsils 1+ and mildly erythematous without white tonsillar exudates, uvula midline; L maxillary sinus tenderness to palpation Neck: supple without LAD Lungs: CTA bilaterally without adventitious breath sounds; cough absent Heart: regular rate and rhythm.  Radial pulses 2+ symmetrical bilaterally Skin: warm and dry Psychological: alert and cooperative; normal mood and affect  LABS: No results found for this or any previous visit (from the past 24 hour(s)).   ASSESSMENT & PLAN:  1. Acute non-recurrent maxillary sinusitis     Meds ordered this encounter  Medications   azithromycin (ZITHROMAX Z-PAK) 250 MG tablet    Sig: Take 2 tablets today, and 1 tablet for the next 4 days.    Dispense:  6 tablet    Refill:  0    Order Specific Question:   Supervising Provider    Answer:   Chase Picket [id number]    Acute Sinusitis Prescribed azithromycin Push fluids and get rest Take as directed and to completion.  Drink warm or cool liquids, use throat lozenges, or popsicles to help alleviate symptoms Take OTC ibuprofen or tylenol as needed for pain Follow up with PCP if symptoms persist Return or go to ER if you have any new or worsening symptoms such as fever, chills, nausea, vomiting, worsening sore throat, cough, abdominal pain, chest pain, changes in bowel or bladder habits.  Covid swab obtained in office today.  Patient instructed to quarantine until results are back and negative.  If results are negative,  patient may resume daily schedule as tolerated once they are fever free for 24 hours without the use of antipyretic medications.  If results are positive, patient instructed to quarantine 10 days from today.  Patient instructed to follow-up with primary care with this office as needed.  Patient instructed to follow-up in the ER for trouble swallowing, trouble breathing, other concerning symptoms.   Reviewed expectations re: course of current medical issues. Questions answered. Outlined signs and symptoms indicating need for more acute intervention. Patient verbalized understanding. After Visit Summary given.           Faustino Congress, NP 12/21/19 2034

## 2019-12-21 NOTE — ED Triage Notes (Signed)
Pt c/o intermittent sinus pressure onset approx 3-4 days ago and was using flonase and netti pot with some improvement. States drainage, congestion and irritation increased today with green drainage. Pt states he has been working outside a lot.  Denies cough, HA, sore throat, abdom pain, n/v/d, body aches, fever, chills.  Pt feels that he had COVID prior to official testing b/c he "had all the symptoms".

## 2019-12-23 LAB — SARS CORONAVIRUS 2 (TAT 6-24 HRS): SARS Coronavirus 2: NEGATIVE

## 2020-01-20 ENCOUNTER — Encounter: Payer: Self-pay | Admitting: Family Medicine

## 2020-01-20 ENCOUNTER — Ambulatory Visit (INDEPENDENT_AMBULATORY_CARE_PROVIDER_SITE_OTHER): Payer: Medicare Other | Admitting: Family Medicine

## 2020-01-20 ENCOUNTER — Other Ambulatory Visit: Payer: Self-pay

## 2020-01-20 VITALS — BP 132/80 | HR 62 | Temp 97.9°F | Wt 197.0 lb

## 2020-01-20 DIAGNOSIS — H6983 Other specified disorders of Eustachian tube, bilateral: Secondary | ICD-10-CM

## 2020-01-20 DIAGNOSIS — I200 Unstable angina: Secondary | ICD-10-CM | POA: Diagnosis not present

## 2020-01-20 NOTE — Patient Instructions (Signed)
Eustachian Tube Dysfunction ° °Eustachian tube dysfunction refers to a condition in which a blockage develops in the narrow passage that connects the middle ear to the back of the nose (eustachian tube). The eustachian tube regulates air pressure in the middle ear by letting air move between the ear and nose. It also helps to drain fluid from the middle ear space. °Eustachian tube dysfunction can affect one or both ears. When the eustachian tube does not function properly, air pressure, fluid, or both can build up in the middle ear. °What are the causes? °This condition occurs when the eustachian tube becomes blocked or cannot open normally. Common causes of this condition include: °· Ear infections. °· Colds and other infections that affect the nose, mouth, and throat (upper respiratory tract). °· Allergies. °· Irritation from cigarette smoke. °· Irritation from stomach acid coming up into the esophagus (gastroesophageal reflux). The esophagus is the tube that carries food from the mouth to the stomach. °· Sudden changes in air pressure, such as from descending in an airplane or scuba diving. °· Abnormal growths in the nose or throat, such as: °? Growths that line the nose (nasal polyps). °? Abnormal growth of cells (tumors). °? Enlarged tissue at the back of the throat (adenoids). °What increases the risk? °You are more likely to develop this condition if: °· You smoke. °· You are overweight. °· You are a child who has: °? Certain birth defects of the mouth, such as cleft palate. °? Large tonsils or adenoids. °What are the signs or symptoms? °Common symptoms of this condition include: °· A feeling of fullness in the ear. °· Ear pain. °· Clicking or popping noises in the ear. °· Ringing in the ear. °· Hearing loss. °· Loss of balance. °· Dizziness. °Symptoms may get worse when the air pressure around you changes, such as when you travel to an area of high elevation, fly on an airplane, or go scuba diving. °How is  this diagnosed? °This condition may be diagnosed based on: °· Your symptoms. °· A physical exam of your ears, nose, and throat. °· Tests, such as those that measure: °? The movement of your eardrum (tympanogram). °? Your hearing (audiometry). °How is this treated? °Treatment depends on the cause and severity of your condition. °· In mild cases, you may relieve your symptoms by moving air into your ears. This is called "popping the ears." °· In more severe cases, or if you have symptoms of fluid in your ears, treatment may include: °? Medicines to relieve congestion (decongestants). °? Medicines that treat allergies (antihistamines). °? Nasal sprays or ear drops that contain medicines that reduce swelling (steroids). °? A procedure to drain the fluid in your eardrum (myringotomy). In this procedure, a small tube is placed in the eardrum to: °§ Drain the fluid. °§ Restore the air in the middle ear space. °? A procedure to insert a balloon device through the nose to inflate the opening of the eustachian tube (balloon dilation). °Follow these instructions at home: °Lifestyle °· Do not do any of the following until your health care provider approves: °? Travel to high altitudes. °? Fly in airplanes. °? Work in a pressurized cabin or room. °? Scuba dive. °· Do not use any products that contain nicotine or tobacco, such as cigarettes and e-cigarettes. If you need help quitting, ask your health care provider. °· Keep your ears dry. Wear fitted earplugs during showering and bathing. Dry your ears completely after. °General instructions °· Take over-the-counter   and prescription medicines only as told by your health care provider. °· Use techniques to help pop your ears as recommended by your health care provider. These may include: °? Chewing gum. °? Yawning. °? Frequent, forceful swallowing. °? Closing your mouth, holding your nose closed, and gently blowing as if you are trying to blow air out of your nose. °· Keep all  follow-up visits as told by your health care provider. This is important. °Contact a health care provider if: °· Your symptoms do not go away after treatment. °· Your symptoms come back after treatment. °· You are unable to pop your ears. °· You have: °? A fever. °? Pain in your ear. °? Pain in your head or neck. °? Fluid draining from your ear. °· Your hearing suddenly changes. °· You become very dizzy. °· You lose your balance. °Summary °· Eustachian tube dysfunction refers to a condition in which a blockage develops in the eustachian tube. °· It can be caused by ear infections, allergies, inhaled irritants, or abnormal growths in the nose or throat. °· Symptoms include ear pain, hearing loss, or ringing in the ears. °· Mild cases are treated with maneuvers to unblock the ears, such as yawning or ear popping. °· Severe cases are treated with medicines. Surgery may also be done (rare). °This information is not intended to replace advice given to you by your health care provider. Make sure you discuss any questions you have with your health care provider. °Document Revised: 10/28/2017 Document Reviewed: 10/28/2017 °Elsevier Patient Education © 2020 [employer] ° °

## 2020-01-20 NOTE — Progress Notes (Signed)
Subjective:    Patient ID: Terry Hansen, male    DOB: Aug 26, 1955, 64 y.o.   MRN: [id number]  No chief complaint on file.   HPI Patient was seen today for ongoing concern.  Pt notes recent sinus infection at the beginning of June treated with azithromycin.  Since then endorses bilateral ears feeling clogged off and on x several weeks.  Pt also notes ringing in left ear.  Pt taking Flonase daily.  Started taking generic Zyrtec last night.  Pt denies fever, chills, nausea, vomiting, facial pain/pressure, headache, sore throat, cough.  Pt notes history of severe epistaxis in the past requiring cessation on left side of nose.  Inquires if that is related to his current problems.  Past Medical History:  Diagnosis Date  . CAD (coronary artery disease)    a.  LHC 06/26/12:  pLAD 90%, prox/mid/dist CFS 20-30%, oRCA 100% (non dom), EF 55%;  b. Echo 12/13:  EF 55-60%, Gr 1 diast dysfn;  c. s/p CABG [facility] Hockey) 12/13: L-LAD/Dx    . GERD (gastroesophageal reflux disease)   . HLD (hyperlipidemia)   . Hypertension   . Ocular migraine    "maybe once q couple months" (06/02/2014)  . Unstable angina pectoris ([facility]CC)     No Known Allergies  ROS General: Denies fever, chills, night sweats, changes in weight, changes in appetite HEENT: Denies headaches, ear pain, changes in vision, rhinorrhea, sore throat  + clogged ears and tinnitus in left ear CV: Denies CP, palpitations, SOB, orthopnea Pulm: Denies SOB, cough, wheezing GI: Denies abdominal pain, nausea, vomiting, diarrhea, constipation GU: Denies dysuria, hematuria, frequency, vaginal discharge Msk: Denies muscle cramps, joint pains Neuro: Denies weakness, numbness, tingling Skin: Denies rashes, bruising Psych: Denies depression, anxiety, hallucinations     Objective:    Blood pressure 132/80, pulse 62, temperature 97.9 F (36.6 C), temperature source Temporal, weight 197 lb (89.4 kg), SpO2 97 %.  Gen. Pleasant, well-nourished, in no distress,  normal affect  HEENT: [place]/AT, face symmetric, conjunctiva clear, no scleral icterus, PERRLA, EOMI, nares patent without drainage, pharynx without erythema or exudate.  Normal external ear and canals.  TMs full bilaterally without supprative fluid or erythema Lungs: no accessory muscle use Cardiovascular: RRR, no peripheral edema Musculoskeletal: No deformities, no cyanosis or clubbing, normal tone Neuro:  A&Ox3, CN II-XII intact, normal gait  Wt Readings from Last 3 Encounters:  01/20/20 197 lb (89.4 kg)  10/05/19 198 lb (89.8 kg)  09/03/19 196 lb (88.9 kg)    Lab Results  Component Value Date   WBC 7.0 08/12/2019   HGB 15.5 08/12/2019   HCT 45.4 08/12/2019   PLT 246 08/12/2019   GLUCOSE 74 08/12/2019   CHOL 130 06/15/2019   TRIG 68.0 06/15/2019   HDL 45.10 06/15/2019   LDLCALC 71 06/15/2019   ALT 19 07/30/2019   AST 14 07/30/2019   NA 140 08/12/2019   K 4.4 08/12/2019   CL 99 08/12/2019   CREATININE 0.95 08/12/2019   BUN 11 08/12/2019   CO2 26 08/12/2019   TSH 1.802 07/10/2019   PSA 0.97 06/15/2019   INR 1.05 10/19/2015   HGBA1C 5.4 04/08/2019    Assessment/Plan:  Eustachian tube dysfunction, bilateral -Continue supportive care including facial massage, yawning, Flonase, allergy pill. -Consider switching allergy medicine.  Offered prescription, patient declined at this time. -Discussed limiting Afrin use to no more than 3 days if needed. -Given handout -Given precautions  F/u as needed for continued or worsening symptoms  Grier Mitts, MD

## 2020-02-02 ENCOUNTER — Ambulatory Visit (INDEPENDENT_AMBULATORY_CARE_PROVIDER_SITE_OTHER): Payer: Medicare Other | Admitting: Adult Health

## 2020-02-02 ENCOUNTER — Other Ambulatory Visit: Payer: Self-pay

## 2020-02-02 ENCOUNTER — Encounter: Payer: Self-pay | Admitting: Adult Health

## 2020-02-02 VITALS — BP 134/88 | Temp 98.4°F | Wt 201.0 lb

## 2020-02-02 DIAGNOSIS — I200 Unstable angina: Secondary | ICD-10-CM

## 2020-02-02 DIAGNOSIS — F329 Major depressive disorder, single episode, unspecified: Secondary | ICD-10-CM

## 2020-02-02 DIAGNOSIS — H6983 Other specified disorders of Eustachian tube, bilateral: Secondary | ICD-10-CM | POA: Diagnosis not present

## 2020-02-02 DIAGNOSIS — F419 Anxiety disorder, unspecified: Secondary | ICD-10-CM | POA: Diagnosis not present

## 2020-02-02 DIAGNOSIS — F32A Depression, unspecified: Secondary | ICD-10-CM

## 2020-02-02 MED ORDER — PREDNISONE 10 MG PO TABS: ORAL_TABLET | ORAL | 0 refills | Status: DC

## 2020-02-02 MED ORDER — CITALOPRAM HYDROBROMIDE 10 MG PO TABS: 10.0000 mg | ORAL_TABLET | Freq: Every day | ORAL | 1 refills | Status: DC

## 2020-02-02 NOTE — Progress Notes (Signed)
Subjective:    Patient ID: Terry Hansen, male    DOB: Oct 22, 1955, 64 y.o.   MRN: [id number]  HPI 64 year old male who  has a past medical history of CAD (coronary artery disease), GERD (gastroesophageal reflux disease), HLD (hyperlipidemia), Hypertension, Ocular migraine, and Unstable angina pectoris ([place]).  He presents to the office today for an growing issue of eustachian tube dysfunction.  He was seen on 01/20/2020 by another provider in the office.  At this time he reported being treated for a sinus infection at the beginning of June with azithromycin, since then he reported the feeling of bilateral ears feeling clogged off and on for several weeks.  He also noticed ringing in his left ear.  He was taking Flonase daily as well as started Zyrtec the night prior.  He denied fever, chills, nausea, vomiting, facial pain or pressure, headache, sore throat, or cough.  He was continued on supportive care including facial massage, yawning, Flonase, and allergy pill.  Today he reports that despite the symptoms he continues to have the same complaints.  He continues to use Flonase and Zyrtec without relief.  Has not developed fevers, chills, sinus pain or pressure, headaches, or cough  He also needs a refill of Celexa for anxiety and depression.  He does feel well controlled on this dose and has no reason to change dosage at this time Review of Systems See HPI   Past Medical History:  Diagnosis Date   CAD (coronary artery disease)    a.  LHC 06/26/12:  pLAD 90%, prox/mid/dist CFS 20-30%, oRCA 100% (non dom), EF 55%;  b. Echo 12/13:  EF 55-60%, Gr 1 diast dysfn;  c. s/p CABG [facility] Hockey) 12/13: L-LAD/Dx     GERD (gastroesophageal reflux disease)    HLD (hyperlipidemia)    Hypertension    Ocular migraine    "maybe once q couple months" (06/02/2014)   Unstable angina pectoris (HCC)     Social History   Socioeconomic History   Marital status: Divorced    Spouse name: Not on file    Number of children: Not on file   Years of education: Not on file   Highest education level: Not on file  Occupational History   Occupation: retired  Tobacco Use   Smoking status: Never Smoker   Smokeless tobacco: Never Used  Scientific laboratory technician Use: Never used  Substance and Sexual Activity   Alcohol use: Yes    Comment: occ-Social per pt   Drug use: No   Sexual activity: Not Currently  Other Topics Concern   Not on file  Social History Narrative   Divorced 7 years ago   3 children   Works in Aeronautical engineer   Social Determinants of Radio broadcast assistant Strain:    Difficulty of Paying Living Expenses:   Food Insecurity:    Worried About Charity fundraiser in the Last Year:    Arboriculturist in the Last Year:   Transportation Needs:    Film/video editor (Medical):    Lack of Transportation (Non-Medical):   Physical Activity:    Days of Exercise per Week:    Minutes of Exercise per Session:   Stress:    Feeling of Stress :   Social Connections:    Frequency of Communication with Friends and Family:    Frequency of Social Gatherings with Friends and Family:    Attends Religious Services:    Active Member  of Clubs or Organizations:    Attends Music therapist:    Marital Status:   Intimate Partner Violence:    Fear of Current or Ex-Partner:    Emotionally Abused:    Physically Abused:    Sexually Abused:     Past Surgical History:  Procedure Laterality Date   APPENDECTOMY  1970's   CARDIAC CATHETERIZATION  06/2012   CORONARY ARTERY BYPASS GRAFT  06/29/2012   Procedure: CORONARY ARTERY BYPASS GRAFTING (CABG);  Surgeon: Melrose Nakayama, MD;  Location: [facility];  Service: Open Heart Surgery;  Laterality: N/A;  times two using Left Internal Mammary Artery   INGUINAL HERNIA REPAIR Bilateral [phone]   LEFT HEART CATH AND CORS/GRAFTS ANGIOGRAPHY N/A 08/16/2019   Procedure: LEFT HEART CATH AND  CORS/GRAFTS ANGIOGRAPHY;  Surgeon: Leonie Man, MD;  Location: [facility];  Service: Cardiovascular;  Laterality: N/A;   LEFT HEART CATHETERIZATION WITH CORONARY ANGIOGRAM N/A 06/26/2012   Procedure: LEFT HEART CATHETERIZATION WITH CORONARY ANGIOGRAM;  Surgeon: Josue Hector, MD;  Location: [facility];  Service: Cardiovascular;  Laterality: N/A;   LEFT HEART CATHETERIZATION WITH CORONARY/GRAFT ANGIOGRAM N/A 06/03/2014   Procedure: LEFT HEART CATHETERIZATION WITH Beatrix Fetters;  Surgeon: Jettie Booze, MD;  Location: [facility];  Service: Cardiovascular;  Laterality: N/A;   NASAL SEPTUM SURGERY     For uncontrolled epistaxis early 2013   SIGMOIDOSCOPY  10-15 years ago   in [place], [place]    Family History  Problem Relation Age of Onset   Heart disease Brother        CABG age 25, redo bypass ~50   Heart disease Paternal Grandmother    Heart disease Cousin        Maternal side   Heart attack Brother    Colon cancer Maternal Grandfather 60       METS to colon from Lung CA per pt   Lung cancer Maternal Grandfather    Colon polyps Neg Hx    Esophageal cancer Neg Hx    Rectal cancer Neg Hx    Stomach cancer Neg Hx     No Known Allergies  Current Outpatient Medications on File Prior to Visit  Medication Sig Dispense Refill   aspirin EC 81 MG tablet Take 1 tablet (81 mg total) by mouth daily.     atorvastatin (LIPITOR) 40 MG tablet TAKE 1 TABLET(40 MG) BY MOUTH AT BEDTIME 90 tablet 3   Coenzyme Q10 200 MG capsule Take 200 mg by mouth daily with breakfast.      diltiazem (CARDIZEM) 30 MG tablet Take 1 tablet (30 mg total) by mouth 4 (four) times daily as needed (elevated heart rates). 30 tablet 3   fluticasone (FLONASE) 50 MCG/ACT nasal spray Place 2 sprays into both nostrils as needed for allergies or rhinitis.     ibuprofen (ADVIL,MOTRIN) 200 MG tablet Take 400-600 mg by mouth every 6 (six) hours as needed for headache or moderate pain.       isosorbide mononitrate (IMDUR) 30 MG 24 hr tablet Take 1 tablet (30 mg total) by mouth daily. 90 tablet 3   losartan (COZAAR) 100 MG tablet Take 1 tablet (100 mg total) by mouth daily. 90 tablet 3   nitroGLYCERIN (NITROSTAT) 0.4 MG SL tablet Place 1 tablet (0.4 mg total) under the tongue every 5 (five) minutes as needed for chest pain. 25 tablet 3   omeprazole (PRILOSEC OTC) 20 MG tablet Take 20 mg by mouth daily as  needed (acid reflux).      sotalol (BETAPACE) 120 MG tablet TAKE 1 TABLET BY MOUTH EVERY 12 HOURS 180 tablet 3   amLODipine (NORVASC) 5 MG tablet Take 1 tablet (5 mg total) by mouth daily. 90 tablet 3   No current facility-administered medications on file prior to visit.    BP 134/88    Temp 98.4 F (36.9 C)    Wt 201 lb (91.2 kg)    BMI 28.03 kg/m       Objective:   Physical Exam Vitals and nursing note reviewed.  Constitutional:      Appearance: Normal appearance.  HENT:     Right Ear: Ear canal and external ear normal. Drainage present. No tenderness. A middle ear effusion is present. There is no impacted cerumen. Tympanic membrane is bulging. Tympanic membrane is not erythematous.     Left Ear: Ear canal and external ear normal. No drainage or tenderness. A middle ear effusion is present. There is no impacted cerumen. Tympanic membrane is bulging. Tympanic membrane is not erythematous.     Nose: Nose normal. No congestion.     Mouth/Throat:     Mouth: Mucous membranes are moist.     Pharynx: Oropharynx is clear. No oropharyngeal exudate or posterior oropharyngeal erythema.  Neurological:     Mental Status: He is alert.       Assessment & Plan:  1. Eustachian tube dysfunction, bilateral -Try systemic corticosteroids.  He was also advised to use Flonase for 3 days.  Follow-up if no improvement.  Consider referral to ear nose and throat - predniSONE (DELTASONE) 10 MG tablet; 40 mg x 3 days, 20 mg x 3 days, 10 mg x 3 days  Dispense: 21 tablet; Refill:  0  2. Anxiety and depression  - citalopram (CELEXA) 10 MG tablet; Take 1 tablet (10 mg total) by mouth daily.  Dispense: 90 tablet; Refill: 1  [name], NP

## 2020-03-16 DIAGNOSIS — Z20822 Contact with and (suspected) exposure to covid-19: Secondary | ICD-10-CM | POA: Diagnosis not present

## 2020-04-04 ENCOUNTER — Telehealth (INDEPENDENT_AMBULATORY_CARE_PROVIDER_SITE_OTHER): Payer: Medicare Other | Admitting: Adult Health

## 2020-04-04 DIAGNOSIS — B349 Viral infection, unspecified: Secondary | ICD-10-CM

## 2020-04-04 NOTE — Progress Notes (Signed)
Virtual Visit via Telephone Note  I connected with Terry Hansen on 04/04/20 at  8:30 AM EDT by telephone and verified that I am speaking with the correct person using two identifiers.   I discussed the limitations, risks, security and privacy concerns of performing an evaluation and management service by telephone and the availability of in person appointments. I also discussed with the patient that there may be a patient responsible charge related to this service. The patient expressed understanding and agreed to proceed.  Location patient: home Location provider: work or home office Participants present for the call: patient, provider Patient did not have a visit in the prior 7 days to address this/these issue(s).   History of Present Illness: 64 year old male who is being evaluated today for an acute issue.  His symptoms started 7 to 10 days ago.  Symptoms included fever x4 to 5 days, this has resolved.  Generalized body aches, fatigue, deep cough.  He was exposed to flu and RSV by his grandchildren.  Reports that his symptoms have greatly improved since he made the appointment and feels as though he is on "the back end of this".  He was seen by urgent care and prescribed antibiotic and prednisone.  He still has a mild cough.   He was tested for Covid 5 days ago which was negative.   Observations/Objective: Patient sounds cheerful and well on the phone. I do not appreciate any SOB. Speech and thought processing are grossly intact. Patient reported vitals:  Assessment and Plan: 1. Viral illness -Possibly RSV from his grandkids.  He is improving and does not want any more medications.  Advise rest and hydration over-the-counter cough medicine if needed.  He will follow-up if symptoms worsen  Follow Up Instructions:  I did not refer this patient for an OV in the next 24 hours for this/these issue(s).  I discussed the assessment and treatment plan with the patient. The patient was  provided an opportunity to ask questions and all were answered. The patient agreed with the plan and demonstrated an understanding of the instructions.   The patient was advised to call back or seek an in-person evaluation if the symptoms worsen or if the condition fails to improve as anticipated.  I provided 18  minutes of non-face-to-face time during this encounter.   [name], NP

## 2020-04-25 NOTE — Progress Notes (Signed)
Patient ID: Terry Hansen, male   DOB: [birth year], 64 y.o.   MRN: [id number]             64 y.o. CABG 2013 LIMA to LAD/Diagonal patent cath 06/03/14 CRF;s HTN, HLD. Has histrory of atrial tachycardia sees [name] for On cardizem and Sotolol ( previously flecainide but changed due to CAD )  Issues with Multaq being cost prohibitive  Fatigue with beta blockers Last myovue May 2019 normal no ischemia Has some anxiety and labile BP  He is retiring from [employer] at end of year and stress Levels will be much lower. Has son in [place] and brother in [place] in [place] Has a bad heart with previous CABG and recent TAVR   D/c from hospital on 07/22/19 with chest pain and palpitations had runs of atrial tachycardia and sotolol started , cardizem D/c due to bradycardia Myovue done 08/10/19 with EF 50% medium sized anterior infarct with peri infarct ischemia.   Cath 08/16/19 no targets for PCI, patent LIMA to D1/LAD large left dominant circumflex and non dominant RCA without significant disease   Seen by [facility] March 2021 and stable arrhythmia on Sotolol  Does not want to get COVID shot discussed at length    No Known Allergies   Current Outpatient Medications  Medication Sig Dispense Refill  . aspirin EC 81 MG tablet Take 1 tablet (81 mg total) by mouth daily.    Marland Kitchen atorvastatin (LIPITOR) 40 MG tablet TAKE 1 TABLET(40 MG) BY MOUTH AT BEDTIME 90 tablet 3  . citalopram (CELEXA) 10 MG tablet Take 1 tablet (10 mg total) by mouth daily. 90 tablet 1  . Coenzyme Q10 200 MG capsule Take 200 mg by mouth daily with breakfast.     . diltiazem (CARDIZEM) 30 MG tablet Take 1 tablet (30 mg total) by mouth 4 (four) times daily as needed (elevated heart rates). 30 tablet 3  . fluticasone (FLONASE) 50 MCG/ACT nasal spray Place 2 sprays into both nostrils as needed for allergies or rhinitis.    Marland Kitchen ibuprofen (ADVIL,MOTRIN) 200 MG tablet Take 400-600 mg by mouth every 6 (six) hours as  needed for headache or moderate pain.     . isosorbide mononitrate (IMDUR) 30 MG 24 hr tablet Take 1 tablet (30 mg total) by mouth daily. 90 tablet 3  . losartan (COZAAR) 100 MG tablet Take 1 tablet (100 mg total) by mouth daily. 90 tablet 3  . nitroGLYCERIN (NITROSTAT) 0.4 MG SL tablet Place 1 tablet (0.4 mg total) under the tongue every 5 (five) minutes as needed for chest pain. 25 tablet 3  . omeprazole (PRILOSEC OTC) 20 MG tablet Take 20 mg by mouth daily as needed (acid reflux).     . sotalol (BETAPACE) 120 MG tablet TAKE 1 TABLET BY MOUTH EVERY 12 HOURS 180 tablet 3  . amLODipine (NORVASC) 5 MG tablet Take 1 tablet (5 mg total) by mouth daily. 90 tablet 3   No current facility-administered medications for this visit.    Past Medical History:  Diagnosis Date  . CAD (coronary artery disease)    a.  LHC 06/26/12:  pLAD 90%, prox/mid/dist CFS 20-30%, oRCA 100% (non dom), EF 55%;  b. Echo 12/13:  EF 55-60%, Gr 1 diast dysfn;  c. s/p CABG Roxan Hockey) 12/13: L-LAD/Dx    . GERD (gastroesophageal reflux disease)   . HLD (hyperlipidemia)   . Hypertension   . Ocular migraine    "maybe once q couple months" (06/02/2014)  .  Unstable angina pectoris [facility])     Past Surgical History:  Procedure Laterality Date  . APPENDECTOMY  1970's  . CARDIAC CATHETERIZATION  06/2012  . CORONARY ARTERY BYPASS GRAFT  06/29/2012   Procedure: CORONARY ARTERY BYPASS GRAFTING (CABG);  Surgeon: Melrose Nakayama, MD;  Location: [place];  Service: Open Heart Surgery;  Laterality: N/A;  times two using Left Internal Mammary Artery  . INGUINAL HERNIA REPAIR Bilateral [phone]  . LEFT HEART CATH AND CORS/GRAFTS ANGIOGRAPHY N/A 08/16/2019   Procedure: LEFT HEART CATH AND CORS/GRAFTS ANGIOGRAPHY;  Surgeon: Leonie Man, MD;  Location: [place] CV LAB;  Service: Cardiovascular;  Laterality: N/A;  . LEFT HEART CATHETERIZATION WITH CORONARY ANGIOGRAM N/A 06/26/2012   Procedure: LEFT HEART CATHETERIZATION WITH  CORONARY ANGIOGRAM;  Surgeon: Josue Hector, MD;  Location: [facility] CATH LAB;  Service: Cardiovascular;  Laterality: N/A;  . LEFT HEART CATHETERIZATION WITH CORONARY/GRAFT ANGIOGRAM N/A 06/03/2014   Procedure: LEFT HEART CATHETERIZATION WITH Beatrix Fetters;  Surgeon: Jettie Booze, MD;  Location: [facility] CATH LAB;  Service: Cardiovascular;  Laterality: N/A;  . NASAL SEPTUM SURGERY     For uncontrolled epistaxis early 2013  . SIGMOIDOSCOPY  10-15 years ago   in [place], [place]    Family History  Problem Relation Age of Onset  . Heart disease Brother        CABG age 13, redo bypass ~50  . Heart disease Paternal Grandmother   . Heart disease Cousin        Maternal side  . Heart attack Brother   . Colon cancer Maternal Grandfather 60       METS to colon from Lung CA per pt  . Lung cancer Maternal Grandfather   . Colon polyps Neg Hx   . Esophageal cancer Neg Hx   . Rectal cancer Neg Hx   . Stomach cancer Neg Hx     Social History   Socioeconomic History  . Marital status: Divorced    Spouse name: Not on file  . Number of children: Not on file  . Years of education: Not on file  . Highest education level: Not on file  Occupational History  . Occupation: retired  Tobacco Use  . Smoking status: Never Smoker  . Smokeless tobacco: Never Used  Vaping Use  . Vaping Use: Never used  Substance and Sexual Activity  . Alcohol use: Yes    Comment: occ-Social per pt  . Drug use: No  . Sexual activity: Not Currently  Other Topics Concern  . Not on file  Social History Narrative   Divorced 7 years ago   3 children   Works in Aeronautical engineer   Social Determinants of Radio broadcast assistant Strain:   . Difficulty of Paying Living Expenses: Not on file  Food Insecurity:   . Worried About Charity fundraiser in the Last Year: Not on file  . Ran Out of Food in the Last Year: Not on file  Transportation Needs:   . Lack of Transportation (Medical): Not on file  .  Lack of Transportation (Non-Medical): Not on file  Physical Activity:   . Days of Exercise per Week: Not on file  . Minutes of Exercise per Session: Not on file  Stress:   . Feeling of Stress : Not on file  Social Connections:   . Frequency of Communication with Friends and Family: Not on file  . Frequency of Social Gatherings with Friends and Family: Not on  file  . Attends Religious Services: Not on file  . Active Member of Clubs or Organizations: Not on file  . Attends Archivist Meetings: Not on file  . Marital Status: Not on file  Intimate Partner Violence:   . Fear of Current or Ex-Partner: Not on file  . Emotionally Abused: Not on file  . Physically Abused: Not on file  . Sexually Abused: Not on file    ROS:  insomnia and anxiety mild GERD all other symptoms reviewed and negative   Vitals:   05/08/20 0910  BP: 136/66  Pulse: (!) 55  SpO2: 96%    Affect appropriate Healthy:  appears stated age HEENT: normal Neck supple with no adenopathy JVP normal no bruits no thyromegaly Lungs clear with no wheezing and good diaphragmatic motion Heart:  S1/S2 no murmur, no rub, gallop or click PMI normal Abdomen: benighn, BS positve, no tenderness, no AAA no bruit.  No HSM or HJR Distal pulses intact with no bruits No edema Neuro non-focal Skin warm and dry No muscular weakness     Wt Readings from Last 3 Encounters:  05/08/20 195 lb 12.8 oz (88.8 kg)  02/02/20 201 lb (91.2 kg)  01/20/20 197 lb (89.4 kg)    Lab Results  Component Value Date   WBC 7.0 08/12/2019   HGB 15.5 08/12/2019   HCT 45.4 08/12/2019   PLT 246 08/12/2019   GLUCOSE 74 08/12/2019   CHOL 130 06/15/2019   TRIG 68.0 06/15/2019   HDL 45.10 06/15/2019   LDLCALC 71 06/15/2019   ALT 19 07/30/2019   AST 14 07/30/2019   NA 140 08/12/2019   K 4.4 08/12/2019   CL 99 08/12/2019   CREATININE 0.95 08/12/2019   BUN 11 08/12/2019   CO2 26 08/12/2019   TSH 1.802 07/10/2019   PSA 0.97  06/15/2019   INR 1.05 10/19/2015   HGBA1C 5.4 04/08/2019    EKG: Normal sinus rhythm, no acute change  09/29/14     Impression:  CAD/CABG: LIMA LAD/D1 patent cath 08/16/19 with no significant disease in left dominant circumflex and  Small non dominant RCA Continue medical Rx   ATrial Tachycardia: Sees [name] stable started on sotolol in hospital end of December 2020 and improved   HTN:  Well controlled.  Continue current medications and low sodium Dash type diet.     Cholesterol: at goal labs with primary   Lab Results  Component Value Date   CHOL 130 06/15/2019   HDL 45.10 06/15/2019   LDLCALC 71 06/15/2019   TRIG 68.0 06/15/2019   CHOLHDL 3 06/15/2019   F/U in 6 months with [name] and me in a year  Jenkins Rouge

## 2020-05-08 ENCOUNTER — Encounter: Payer: Self-pay | Admitting: Cardiovascular Disease

## 2020-05-08 ENCOUNTER — Ambulatory Visit (INDEPENDENT_AMBULATORY_CARE_PROVIDER_SITE_OTHER): Payer: Medicare Other | Admitting: Cardiovascular Disease

## 2020-05-08 ENCOUNTER — Other Ambulatory Visit: Payer: Self-pay

## 2020-05-08 VITALS — BP 136/66 | HR 55 | Ht 71.0 in | Wt 195.8 lb

## 2020-05-08 DIAGNOSIS — I2510 Atherosclerotic heart disease of native coronary artery without angina pectoris: Secondary | ICD-10-CM

## 2020-05-08 DIAGNOSIS — I471 Supraventricular tachycardia: Secondary | ICD-10-CM

## 2020-05-08 DIAGNOSIS — I200 Unstable angina: Secondary | ICD-10-CM

## 2020-05-08 NOTE — Patient Instructions (Signed)

## 2020-06-20 ENCOUNTER — Other Ambulatory Visit: Payer: Self-pay

## 2020-06-20 ENCOUNTER — Ambulatory Visit (INDEPENDENT_AMBULATORY_CARE_PROVIDER_SITE_OTHER): Payer: Medicare Other | Admitting: Cardiology

## 2020-06-20 ENCOUNTER — Encounter: Payer: Self-pay | Admitting: Cardiology

## 2020-06-20 VITALS — BP 150/90 | HR 63 | Ht 71.0 in | Wt 198.0 lb

## 2020-06-20 DIAGNOSIS — I200 Unstable angina: Secondary | ICD-10-CM

## 2020-06-20 DIAGNOSIS — I471 Supraventricular tachycardia: Secondary | ICD-10-CM

## 2020-06-20 NOTE — Patient Instructions (Signed)
Medication Instructions:  Your physician recommends that you continue on your current medications as directed. Please refer to the Current Medication list given to you today.  *If you need a refill on your cardiac medications before your next appointment, please call your pharmacy*   Lab Work: None ordered   Testing/Procedures: None ordered   Follow-Up: At [facility], you and your health needs are our priority.  As part of our continuing mission to provide you with exceptional heart care, we have created designated Provider Care Teams.  These Care Teams include your primary Cardiologist (physician) and Advanced Practice Providers (APPs -  Physician Assistants and Nurse Practitioners) who all work together to provide you with the care you need, when you need it.  We recommend signing up for the patient portal called "MyChart".  Sign up information is provided on this After Visit Summary.  MyChart is used to connect with patients for Virtual Visits (Telemedicine).  Patients are able to view lab/test results, encounter notes, upcoming appointments, etc.  Non-urgent messages can be sent to your provider as well.   To learn more about what you can do with MyChart, go to [email or web address].    Your next appointment:   6 month(s)  The format for your next appointment:   In Person  Provider:   Will [name], MD   Thank you for choosing [facility]!!   Achol Azpeitia, RN [phone]     

## 2020-06-20 NOTE — Progress Notes (Signed)
Electrophysiology Office Note   Date:  06/20/2020   ID:  Terry Hansen, DOB 06/15/1956, MRN [id number]  PCP:  [name], NP  Cardiologist: Johnsie Cancel  Primary Electrophysiologist:  Kyaire Gruenewald Meredith Leeds, MD    No chief complaint on file.    History of Present Illness: Terry Hansen is a 64 y.o. male who presents today for electrophysiology evaluation.     He presented to the hospital the end of March with palpitations and was found to have atrial tachycardia with multiple short runs of tachycardia.  He was started on metoprolol.  He represented to the hospital a month later with similar symptoms.  His metoprolol was stopped and he was started on diltiazem.  He continued to have episodes and has now been started on sotalol.  Today, denies symptoms of palpitations, chest pain, shortness of breath, orthopnea, PND, lower extremity edema, claudication, dizziness, presyncope, syncope, bleeding, or neurologic sequela. The patient is tolerating medications without difficulties.  Since last being seen he has done well.  He has no chest pain or shortness of breath and is able do all of his daily activities without restriction.  He states that he feels better now than he has in the last few years.  He has not had any palpitations.  Past Medical History:  Diagnosis Date  . CAD (coronary artery disease)    a.  LHC 06/26/12:  pLAD 90%, prox/mid/dist CFS 20-30%, oRCA 100% (non dom), EF 55%;  b. Echo 12/13:  EF 55-60%, Gr 1 diast dysfn;  c. s/p CABG Roxan Hockey) 12/13: L-LAD/Dx    . GERD (gastroesophageal reflux disease)   . HLD (hyperlipidemia)   . Hypertension   . Ocular migraine    "maybe once q couple months" (06/02/2014)  . Unstable angina pectoris [facility])    Past Surgical History:  Procedure Laterality Date  . APPENDECTOMY  1970's  . CARDIAC CATHETERIZATION  06/2012  . CORONARY ARTERY BYPASS GRAFT  06/29/2012   Procedure: CORONARY ARTERY BYPASS GRAFTING (CABG);  Surgeon: Melrose Nakayama, MD;  Location: [place];  Service: Open Heart Surgery;  Laterality: N/A;  times two using Left Internal Mammary Artery  . INGUINAL HERNIA REPAIR Bilateral [phone]  . LEFT HEART CATH AND CORS/GRAFTS ANGIOGRAPHY N/A 08/16/2019   Procedure: LEFT HEART CATH AND CORS/GRAFTS ANGIOGRAPHY;  Surgeon: Leonie Man, MD;  Location: [place] CV LAB;  Service: Cardiovascular;  Laterality: N/A;  . LEFT HEART CATHETERIZATION WITH CORONARY ANGIOGRAM N/A 06/26/2012   Procedure: LEFT HEART CATHETERIZATION WITH CORONARY ANGIOGRAM;  Surgeon: Josue Hector, MD;  Location: [facility] CATH LAB;  Service: Cardiovascular;  Laterality: N/A;  . LEFT HEART CATHETERIZATION WITH CORONARY/GRAFT ANGIOGRAM N/A 06/03/2014   Procedure: LEFT HEART CATHETERIZATION WITH Beatrix Fetters;  Surgeon: Jettie Booze, MD;  Location: [facility] CATH LAB;  Service: Cardiovascular;  Laterality: N/A;  . NASAL SEPTUM SURGERY     For uncontrolled epistaxis early 2013  . SIGMOIDOSCOPY  10-15 years ago   in [place], [place]     Current Outpatient Medications  Medication Sig Dispense Refill  . aspirin EC 81 MG tablet Take 1 tablet (81 mg total) by mouth daily.    Marland Kitchen atorvastatin (LIPITOR) 40 MG tablet TAKE 1 TABLET(40 MG) BY MOUTH AT BEDTIME 90 tablet 3  . citalopram (CELEXA) 10 MG tablet Take 1 tablet (10 mg total) by mouth daily. 90 tablet 1  . Coenzyme Q10 200 MG capsule Take 200 mg by mouth daily with breakfast.     . diltiazem (  CARDIZEM) 30 MG tablet Take 1 tablet (30 mg total) by mouth 4 (four) times daily as needed (elevated heart rates). 30 tablet 3  . fluticasone (FLONASE) 50 MCG/ACT nasal spray Place 2 sprays into both nostrils as needed for allergies or rhinitis.    Marland Kitchen ibuprofen (ADVIL,MOTRIN) 200 MG tablet Take 400-600 mg by mouth every 6 (six) hours as needed for headache or moderate pain.     . isosorbide mononitrate (IMDUR) 30 MG 24 hr tablet Take 1 tablet (30 mg total) by mouth daily. 90 tablet 3  . losartan (COZAAR)  100 MG tablet Take 1 tablet (100 mg total) by mouth daily. 90 tablet 3  . nitroGLYCERIN (NITROSTAT) 0.4 MG SL tablet Place 1 tablet (0.4 mg total) under the tongue every 5 (five) minutes as needed for chest pain. 25 tablet 3  . omeprazole (PRILOSEC OTC) 20 MG tablet Take 20 mg by mouth daily as needed (acid reflux).     . sotalol (BETAPACE) 120 MG tablet TAKE 1 TABLET BY MOUTH EVERY 12 HOURS 180 tablet 3  . amLODipine (NORVASC) 5 MG tablet Take 1 tablet (5 mg total) by mouth daily. 90 tablet 3   No current facility-administered medications for this visit.    Allergies:   Patient has no known allergies.   Social History:  The patient  reports that he has never smoked. He has never used smokeless tobacco. He reports current alcohol use. He reports that he does not use drugs.   Family History:  The patient's family history includes Colon cancer (age of onset: 60) in his maternal grandfather; Heart attack in his brother; Heart disease in his brother, cousin, and paternal grandmother; Lung cancer in his maternal grandfather.   ROS:  Please see the history of present illness.   Otherwise, review of systems is positive for none.   All other systems are reviewed and negative.   PHYSICAL EXAM: VS:  BP (!) 150/90   Pulse 63   Ht 5\' 11"  (1.803 m)   Wt 198 lb (89.8 kg)   SpO2 98%   BMI 27.62 kg/m  , BMI Body mass index is 27.62 kg/m. GEN: Well nourished, well developed, in no acute distress  HEENT: normal  Neck: no JVD, carotid bruits, or masses Cardiac: RRR; no murmurs, rubs, or gallops,no edema  Respiratory:  clear to auscultation bilaterally, normal work of breathing GI: soft, nontender, nondistended, + BS MS: no deformity or atrophy  Skin: warm and dry Neuro:  Strength and sensation are intact Psych: euthymic mood, full affect  EKG:  EKG is ordered today. Personal review of the ekg ordered shows sinus rhythm, right bundle branch block, QTC 470 ms  Recent Labs: 07/10/2019: TSH  1.802 07/30/2019: ALT 19; Magnesium 2.0 08/12/2019: BUN 11; Creatinine, Ser 0.95; Hemoglobin 15.5; Platelets 246; Potassium 4.4; Sodium 140    Lipid Panel     Component Value Date/Time   CHOL 130 06/15/2019 0834   TRIG 68.0 06/15/2019 0834   HDL 45.10 06/15/2019 0834   CHOLHDL 3 06/15/2019 0834   VLDL 13.6 06/15/2019 0834   LDLCALC 71 06/15/2019 0834     Wt Readings from Last 3 Encounters:  06/20/20 198 lb (89.8 kg)  05/08/20 195 lb 12.8 oz (88.8 kg)  02/02/20 201 lb (91.2 kg)      Other studies Reviewed: Additional studies/ records that were reviewed today include: LHC 08/16/19  Review of the above records today demonstrates:   Angiographically no change in coronary disease as noted in  2015-proximal-mid LAD 80 to 85% focal stenosis at branch point with D1 and SP1. ? This lesion could explain results from stress test.  Not PCI target because it would likely jeopardize both grafts without potentially benefiting the SP branch.  Widely patent LIMA-D1-LAD with retrograde filling to the original lesion  Separate ostium dominant Left Circumflex with mild AV groove disease.  Small nondominant RCA.  TTE 04/09/19 1. Left ventricular ejection fraction, by visual estimation, is 60 to  65%. The left ventricle has normal function. Normal left ventricular size.  There is no left ventricular hypertrophy.  2. Left ventricular diastolic Doppler parameters are consistent with  impaired relaxation pattern of LV diastolic filling.  3. Global right ventricle has normal systolic function.The right  ventricular size is normal. No increase in right ventricular wall  thickness.  4. Left atrial size was normal.  5. Right atrial size was normal.  6. The mitral valve is normal in structure. Mild mitral valve  regurgitation. No evidence of mitral stenosis.  7. The tricuspid valve is normal in structure. Tricuspid valve  regurgitation is mild.  8. The aortic valve is severely calcified,  possibly bicuspid. Aortic  valve regurgitation is mild by color flow Doppler. Mild aortic valve  stenosis.  9. The pulmonic valve was normal in structure. Pulmonic valve  regurgitation is not visualized by color flow Doppler.  10. The inferior vena cava is normal in size with greater than 50%  respiratory variability, suggesting right atrial pressure of 3 mmHg.   ASSESSMENT AND PLAN:  1.  Atrial tachycardia: Currently on sotalol, high risk medication monitoring.  Has been stable without palpitations.  He is tolerating the sotalol without issues.  No changes.  2.  Hypertension: Elevated today but usually well controlled.  He checks it at home and if it goes up, he Areona Homer restart his amlodipine.  3.  Coronary artery disease status post CABG: Ejection fraction is normal.  No chest pain.  No changes.    Current medicines are reviewed at length with the patient today.   The patient does not have concerns regarding his medicines.  The following changes were made today: None  Labs/ tests ordered today include:  Orders Placed This Encounter  Procedures  . EKG 12-Lead     Disposition:   FU with Afnan Cadiente 6 months  Signed, Sueann Brownley Meredith Leeds, MD  06/20/2020 11:02 AM     [facility] [address] [place] [address] [phone] (office) [phone] (fax)

## 2020-07-03 ENCOUNTER — Encounter: Payer: Self-pay | Admitting: Adult Health

## 2020-07-03 ENCOUNTER — Ambulatory Visit (INDEPENDENT_AMBULATORY_CARE_PROVIDER_SITE_OTHER): Payer: Medicare Other | Admitting: Adult Health

## 2020-07-03 ENCOUNTER — Other Ambulatory Visit: Payer: Self-pay

## 2020-07-03 VITALS — BP 150/84 | HR 60 | Temp 98.2°F | Ht 71.0 in | Wt 198.0 lb

## 2020-07-03 DIAGNOSIS — I200 Unstable angina: Secondary | ICD-10-CM | POA: Diagnosis not present

## 2020-07-03 DIAGNOSIS — M79605 Pain in left leg: Secondary | ICD-10-CM | POA: Diagnosis not present

## 2020-07-03 MED ORDER — CYCLOBENZAPRINE HCL 10 MG PO TABS: 10.0000 mg | ORAL_TABLET | Freq: Three times a day (TID) | ORAL | 0 refills | Status: DC | PRN

## 2020-07-03 NOTE — Patient Instructions (Signed)
It was great seeing you today   I think the discomfort you are feeling is all soft tissue injury   I have sent in flexeril which is a muscle relaxer, use this as directed. It may make you feel sleepy   Continue with stretching exercises and also use a heating pad when resting

## 2020-07-03 NOTE — Progress Notes (Signed)
Subjective:    Patient ID: Terry Hansen, male    DOB: 1955-12-23, 64 y.o.   MRN: [id number]  HPI 63 year old male who  has a past medical history of CAD (coronary artery disease), GERD (gastroesophageal reflux disease), HLD (hyperlipidemia), Hypertension, Ocular migraine, and Unstable angina pectoris ([place]).  He presents to the office today for an acute issue of left knee/left leg pain. Has been present for about three weeks intermittently. Pain I described as an " ache/tight" and can be in the hamstring, behind the knee or in the calf. Prior to the pain he was working in a cramped position in his attic. Pain is worse when sitting and resting. When he gets up and stands the pain improves.   He denies leg swelling, shortness of breath, or chest pain   Has used motrin intermittently and done stretching exercises with minimal relief    Review of Systems See HPI   Past Medical History:  Diagnosis Date  . CAD (coronary artery disease)    a.  LHC 06/26/12:  pLAD 90%, prox/mid/dist CFS 20-30%, oRCA 100% (non dom), EF 55%;  b. Echo 12/13:  EF 55-60%, Gr 1 diast dysfn;  c. s/p CABG [facility] Hockey) 12/13: L-LAD/Dx    . GERD (gastroesophageal reflux disease)   . HLD (hyperlipidemia)   . Hypertension   . Ocular migraine    "maybe once q couple months" (06/02/2014)  . Unstable angina pectoris [facility])     Social History   Socioeconomic History  . Marital status: Divorced    Spouse name: Not on file  . Number of children: Not on file  . Years of education: Not on file  . Highest education level: Not on file  Occupational History  . Occupation: retired  Tobacco Use  . Smoking status: Never Smoker  . Smokeless tobacco: Never Used  Vaping Use  . Vaping Use: Never used  Substance and Sexual Activity  . Alcohol use: Yes    Comment: occ-Social per pt  . Drug use: No  . Sexual activity: Not Currently  Other Topics Concern  . Not on file  Social History Narrative   Divorced 7 years ago   3  children   Works in Aeronautical engineer   Social Determinants of Radio broadcast assistant Strain: Not on file  Food Insecurity: Not on file  Transportation Needs: Not on file  Physical Activity: Not on file  Stress: Not on file  Social Connections: Not on file  Intimate Partner Violence: Not on file    Past Surgical History:  Procedure Laterality Date  . APPENDECTOMY  1970's  . CARDIAC CATHETERIZATION  06/2012  . CORONARY ARTERY BYPASS GRAFT  06/29/2012   Procedure: CORONARY ARTERY BYPASS GRAFTING (CABG);  Surgeon: Melrose Nakayama, MD;  Location: [place];  Service: Open Heart Surgery;  Laterality: N/A;  times two using Left Internal Mammary Artery  . INGUINAL HERNIA REPAIR Bilateral [phone]  . LEFT HEART CATH AND CORS/GRAFTS ANGIOGRAPHY N/A 08/16/2019   Procedure: LEFT HEART CATH AND CORS/GRAFTS ANGIOGRAPHY;  Surgeon: Leonie Man, MD;  Location: [place] CV LAB;  Service: Cardiovascular;  Laterality: N/A;  . LEFT HEART CATHETERIZATION WITH CORONARY ANGIOGRAM N/A 06/26/2012   Procedure: LEFT HEART CATHETERIZATION WITH CORONARY ANGIOGRAM;  Surgeon: Josue Hector, MD;  Location: [facility] CATH LAB;  Service: Cardiovascular;  Laterality: N/A;  . LEFT HEART CATHETERIZATION WITH CORONARY/GRAFT ANGIOGRAM N/A 06/03/2014   Procedure: LEFT HEART CATHETERIZATION WITH CORONARY/GRAFT ANGIOGRAM;  Surgeon: Conception Oms  Hassell Done, MD;  Location: [facility] CATH LAB;  Service: Cardiovascular;  Laterality: N/A;  . NASAL SEPTUM SURGERY     For uncontrolled epistaxis early 2013  . SIGMOIDOSCOPY  10-15 years ago   in [place], [place]    Family History  Problem Relation Age of Onset  . Heart disease Brother        CABG age 63, redo bypass ~50  . Heart disease Paternal Grandmother   . Heart disease Cousin        Maternal side  . Heart attack Brother   . Colon cancer Maternal Grandfather 60       METS to colon from Lung CA per pt  . Lung cancer Maternal Grandfather   . Colon polyps Neg Hx   .  Esophageal cancer Neg Hx   . Rectal cancer Neg Hx   . Stomach cancer Neg Hx     No Known Allergies  Current Outpatient Medications on File Prior to Visit  Medication Sig Dispense Refill  . aspirin EC 81 MG tablet Take 1 tablet (81 mg total) by mouth daily.    Marland Kitchen atorvastatin (LIPITOR) 40 MG tablet TAKE 1 TABLET(40 MG) BY MOUTH AT BEDTIME 90 tablet 3  . citalopram (CELEXA) 10 MG tablet Take 1 tablet (10 mg total) by mouth daily. 90 tablet 1  . Coenzyme Q10 200 MG capsule Take 200 mg by mouth daily with breakfast.     . diltiazem (CARDIZEM) 30 MG tablet Take 1 tablet (30 mg total) by mouth 4 (four) times daily as needed (elevated heart rates). 30 tablet 3  . fluticasone (FLONASE) 50 MCG/ACT nasal spray Place 2 sprays into both nostrils as needed for allergies or rhinitis.    Marland Kitchen ibuprofen (ADVIL,MOTRIN) 200 MG tablet Take 400-600 mg by mouth every 6 (six) hours as needed for headache or moderate pain.     . isosorbide mononitrate (IMDUR) 30 MG 24 hr tablet Take 1 tablet (30 mg total) by mouth daily. 90 tablet 3  . losartan (COZAAR) 100 MG tablet Take 1 tablet (100 mg total) by mouth daily. 90 tablet 3  . nitroGLYCERIN (NITROSTAT) 0.4 MG SL tablet Place 1 tablet (0.4 mg total) under the tongue every 5 (five) minutes as needed for chest pain. 25 tablet 3  . omeprazole (PRILOSEC OTC) 20 MG tablet Take 20 mg by mouth daily as needed (acid reflux).     . sotalol (BETAPACE) 120 MG tablet TAKE 1 TABLET BY MOUTH EVERY 12 HOURS 180 tablet 3  . amLODipine (NORVASC) 5 MG tablet Take 1 tablet (5 mg total) by mouth daily. 90 tablet 3   No current facility-administered medications on file prior to visit.    BP (!) 150/84   Pulse 60   Temp 98.2 F (36.8 C) (Oral)   Ht 5\' 11"  (1.803 m)   Wt 198 lb (89.8 kg)   SpO2 98%   BMI 27.62 kg/m       Objective:   Physical Exam Vitals and nursing note reviewed.  Constitutional:      Appearance: Normal appearance.  Cardiovascular:     Rate and Rhythm:  Normal rate and regular rhythm.     Pulses: Normal pulses.     Heart sounds: Normal heart sounds.  Musculoskeletal:        General: Tenderness (in hamstring and calf with palpation. No bakers cyst noted. No redness, warmth, or swelling ) present. Normal range of motion.  Skin:    General: Skin is warm  and dry.     Capillary Refill: Capillary refill takes less than 2 seconds.  Neurological:     General: No focal deficit present.     Mental Status: He is alert and oriented to person, place, and time.       Assessment & Plan:  1. Left leg pain - Appears to be soft tissue injury. Advised heating pad, stretching exercises, and will prescribe a short course of flexeril  - cyclobenzaprine (FLEXERIL) 10 MG tablet; Take 1 tablet (10 mg total) by mouth 3 (three) times daily as needed for muscle spasms.  Dispense: 30 tablet; Refill: 0  [name], NP

## 2020-07-06 DIAGNOSIS — H5203 Hypermetropia, bilateral: Secondary | ICD-10-CM | POA: Diagnosis not present

## 2020-07-06 DIAGNOSIS — I10 Essential (primary) hypertension: Secondary | ICD-10-CM | POA: Diagnosis not present

## 2020-07-06 DIAGNOSIS — H35033 Hypertensive retinopathy, bilateral: Secondary | ICD-10-CM | POA: Diagnosis not present

## 2020-07-06 DIAGNOSIS — H2513 Age-related nuclear cataract, bilateral: Secondary | ICD-10-CM | POA: Diagnosis not present

## 2020-07-06 DIAGNOSIS — H524 Presbyopia: Secondary | ICD-10-CM | POA: Diagnosis not present

## 2020-07-06 DIAGNOSIS — H52223 Regular astigmatism, bilateral: Secondary | ICD-10-CM | POA: Diagnosis not present

## 2020-07-12 ENCOUNTER — Other Ambulatory Visit: Payer: Self-pay | Admitting: Cardiovascular Disease

## 2020-07-12 ENCOUNTER — Other Ambulatory Visit: Payer: Self-pay | Admitting: Adult Health

## 2020-08-01 ENCOUNTER — Other Ambulatory Visit: Payer: Self-pay | Admitting: Adult Health

## 2020-08-01 DIAGNOSIS — F32A Depression, unspecified: Secondary | ICD-10-CM

## 2020-08-01 DIAGNOSIS — F419 Anxiety disorder, unspecified: Secondary | ICD-10-CM

## 2020-08-02 NOTE — Telephone Encounter (Signed)
SENT TO THE PHARMACY BY E-SCRIBE FOR 30 DAYS.  I HAVE SCHEDULED THE PT FOR HIS PHYSICAL ON 09/01/20.

## 2020-08-21 ENCOUNTER — Other Ambulatory Visit: Payer: Self-pay | Admitting: Cardiovascular Disease

## 2020-08-31 ENCOUNTER — Other Ambulatory Visit: Payer: Self-pay

## 2020-09-01 ENCOUNTER — Encounter: Payer: Self-pay | Admitting: Adult Health

## 2020-09-01 ENCOUNTER — Ambulatory Visit (INDEPENDENT_AMBULATORY_CARE_PROVIDER_SITE_OTHER): Payer: Medicare Other | Admitting: Adult Health

## 2020-09-01 VITALS — BP 120/78 | Temp 97.7°F | Ht 72.0 in | Wt 198.0 lb

## 2020-09-01 DIAGNOSIS — I10 Essential (primary) hypertension: Secondary | ICD-10-CM | POA: Diagnosis not present

## 2020-09-01 DIAGNOSIS — F32A Depression, unspecified: Secondary | ICD-10-CM

## 2020-09-01 DIAGNOSIS — E785 Hyperlipidemia, unspecified: Secondary | ICD-10-CM

## 2020-09-01 DIAGNOSIS — F419 Anxiety disorder, unspecified: Secondary | ICD-10-CM | POA: Diagnosis not present

## 2020-09-01 DIAGNOSIS — I471 Supraventricular tachycardia: Secondary | ICD-10-CM | POA: Diagnosis not present

## 2020-09-01 DIAGNOSIS — R972 Elevated prostate specific antigen [PSA]: Secondary | ICD-10-CM | POA: Diagnosis not present

## 2020-09-01 DIAGNOSIS — I2510 Atherosclerotic heart disease of native coronary artery without angina pectoris: Secondary | ICD-10-CM | POA: Diagnosis not present

## 2020-09-01 LAB — CBC WITH DIFFERENTIAL/PLATELET
Basophils Absolute: 0 10*3/uL (ref 0.0–0.1)
Basophils Relative: 0.2 % (ref 0.0–3.0)
Eosinophils Absolute: 0.1 10*3/uL (ref 0.0–0.7)
Eosinophils Relative: 1.6 % (ref 0.0–5.0)
HCT: 43.2 % (ref 39.0–52.0)
Hemoglobin: 15 g/dL (ref 13.0–17.0)
Lymphocytes Relative: 31.9 % (ref 12.0–46.0)
Lymphs Abs: 2.3 10*3/uL (ref 0.7–4.0)
MCHC: 34.8 g/dL (ref 30.0–36.0)
MCV: 85.3 fl (ref 78.0–100.0)
Monocytes Absolute: 0.5 10*3/uL (ref 0.1–1.0)
Monocytes Relative: 7.6 % (ref 3.0–12.0)
Neutro Abs: 4.2 10*3/uL (ref 1.4–7.7)
Neutrophils Relative %: 58.7 % (ref 43.0–77.0)
Platelets: 231 10*3/uL (ref 150.0–400.0)
RBC: 5.06 Mil/uL (ref 4.22–5.81)
RDW: 13.1 % (ref 11.5–15.5)
WBC: 7.2 10*3/uL (ref 4.0–10.5)

## 2020-09-01 LAB — PSA: PSA: 0.88 ng/mL (ref 0.10–4.00)

## 2020-09-01 LAB — COMPREHENSIVE METABOLIC PANEL
ALT: 22 U/L (ref 0–53)
AST: 16 U/L (ref 0–37)
Albumin: 4.1 g/dL (ref 3.5–5.2)
Alkaline Phosphatase: 124 U/L — ABNORMAL HIGH (ref 39–117)
BUN: 14 mg/dL (ref 6–23)
CO2: 31 mEq/L (ref 19–32)
Calcium: 9.5 mg/dL (ref 8.4–10.5)
Chloride: 102 mEq/L (ref 96–112)
Creatinine, Ser: 1.23 mg/dL (ref 0.40–1.50)
GFR: 60.96 mL/min (ref >60.00)
Glucose, Bld: 98 mg/dL (ref 70–99)
Potassium: 5 mEq/L (ref 3.5–5.1)
Sodium: 139 mEq/L (ref 135–145)
Total Bilirubin: 0.9 mg/dL (ref 0.2–1.2)
Total Protein: 6.6 g/dL (ref 6.0–8.3)

## 2020-09-01 LAB — LIPID PANEL
Cholesterol: 111 mg/dL (ref 0–200)
HDL: 39.1 mg/dL (ref >39.00)
LDL Cholesterol: 57 mg/dL (ref 0–99)
NonHDL: 71.43
Total CHOL/HDL Ratio: 3
Triglycerides: 73 mg/dL (ref 0.0–149.0)
VLDL: 14.6 mg/dL (ref 0.0–40.0)

## 2020-09-01 LAB — TSH: TSH: 2.17 u[IU]/mL (ref 0.35–4.50)

## 2020-09-01 MED ORDER — CITALOPRAM HYDROBROMIDE 10 MG PO TABS
10.0000 mg | ORAL_TABLET | Freq: Every day | ORAL | 1 refills | Status: DC
Start: 2020-09-01 — End: 2020-12-12

## 2020-09-01 NOTE — Progress Notes (Signed)
Subjective:    Patient ID: Terry Hansen, male    DOB: December 25, 1955, 65 y.o.   MRN: [id number]  HPI Patient presents for yearly preventative medicine examination. He is a pleasant 65 year old male who  has a past medical history of CAD (coronary artery disease), GERD (gastroesophageal reflux disease), HLD (hyperlipidemia), Hypertension, Ocular migraine, and Unstable angina pectoris ([place]).  CAD s/p CABG -followed by cardiology. CABG in 2013 LIMA to LAD/diagonal pain cath. Repeat cath on 08/16/2019 which showed no targets for PCI, patent LIMA to D1/LAD large left dominant circumflex with nondominant RCA without significant disease. Currently prescribed Lipitor 40 mg, aspirin 81 mg, and Cardizem 30 mg QID.   He denies chest pain or shortness of breath  Atrial Tachycardia -followed by cardiology as well currently prescribed Cardizem and sotalol-arrhythmias stable  Hypertension-is currently prescribed losartan 50 mg daily and Cardizem He denies dizziness, lightheadedness, chest pain, shortness of breath, or syncopal episodes  Anxiety and depression- controlled with Celexa 10 mg   All immunizations and health maintenance protocols were reviewed with the patient and needed orders were placed. Due for his second pneumonia vaccination as well as Covid vaccinations, he does not want to get Covid vaccinations  Appropriate screening laboratory values were ordered for the patient including screening of hyperlipidemia, renal function and hepatic function. If indicated by BPH, a PSA was ordered.  Medication reconciliation,  past medical history, social history, problem list and allergies were reviewed in detail with the patient  Goals were established with regard to weight loss, exercise, and  diet in compliance with medications Wt Readings from Last 3 Encounters:  09/01/20 198 lb (89.8 kg)  07/03/20 198 lb (89.8 kg)  06/20/20 198 lb (89.8 kg)    He is up-to-date on routine colon cancer  screening   Review of Systems  Constitutional: Negative.   HENT: Negative.   Eyes: Negative.   Respiratory: Negative.   Cardiovascular: Negative.   Gastrointestinal: Negative.   Endocrine: Negative.   Genitourinary: Negative.   Musculoskeletal: Negative.   Skin: Negative.   Allergic/Immunologic: Negative.   Neurological: Negative.   Hematological: Negative.   Psychiatric/Behavioral: Negative.   All other systems reviewed and are negative.  Past Medical History:  Diagnosis Date  . CAD (coronary artery disease)    a.  LHC 06/26/12:  pLAD 90%, prox/mid/dist CFS 20-30%, oRCA 100% (non dom), EF 55%;  b. Echo 12/13:  EF 55-60%, Gr 1 diast dysfn;  c. s/p CABG [facility] Hockey) 12/13: L-LAD/Dx    . GERD (gastroesophageal reflux disease)   . HLD (hyperlipidemia)   . Hypertension   . Ocular migraine    "maybe once q couple months" (06/02/2014)  . Unstable angina pectoris [facility])     Social History   Socioeconomic History  . Marital status: Divorced    Spouse name: Not on file  . Number of children: Not on file  . Years of education: Not on file  . Highest education level: Not on file  Occupational History  . Occupation: retired  Tobacco Use  . Smoking status: Never Smoker  . Smokeless tobacco: Never Used  Vaping Use  . Vaping Use: Never used  Substance and Sexual Activity  . Alcohol use: Yes    Comment: occ-Social per pt  . Drug use: No  . Sexual activity: Not Currently  Other Topics Concern  . Not on file  Social History Narrative   Divorced 7 years ago   3 children   Works in Aeronautical engineer  Social Determinants of Health   Financial Resource Strain: Not on file  Food Insecurity: Not on file  Transportation Needs: Not on file  Physical Activity: Not on file  Stress: Not on file  Social Connections: Not on file  Intimate Partner Violence: Not on file    Past Surgical History:  Procedure Laterality Date  . APPENDECTOMY  1970's  . CARDIAC CATHETERIZATION   06/2012  . CORONARY ARTERY BYPASS GRAFT  06/29/2012   Procedure: CORONARY ARTERY BYPASS GRAFTING (CABG);  Surgeon: Melrose Nakayama, MD;  Location: [place];  Service: Open Heart Surgery;  Laterality: N/A;  times two using Left Internal Mammary Artery  . INGUINAL HERNIA REPAIR Bilateral [phone]  . LEFT HEART CATH AND CORS/GRAFTS ANGIOGRAPHY N/A 08/16/2019   Procedure: LEFT HEART CATH AND CORS/GRAFTS ANGIOGRAPHY;  Surgeon: Leonie Man, MD;  Location: [place] CV LAB;  Service: Cardiovascular;  Laterality: N/A;  . LEFT HEART CATHETERIZATION WITH CORONARY ANGIOGRAM N/A 06/26/2012   Procedure: LEFT HEART CATHETERIZATION WITH CORONARY ANGIOGRAM;  Surgeon: Josue Hector, MD;  Location: [facility] CATH LAB;  Service: Cardiovascular;  Laterality: N/A;  . LEFT HEART CATHETERIZATION WITH CORONARY/GRAFT ANGIOGRAM N/A 06/03/2014   Procedure: LEFT HEART CATHETERIZATION WITH Beatrix Fetters;  Surgeon: Jettie Booze, MD;  Location: [facility] CATH LAB;  Service: Cardiovascular;  Laterality: N/A;  . NASAL SEPTUM SURGERY     For uncontrolled epistaxis early 2013  . SIGMOIDOSCOPY  10-15 years ago   in [place], [place]    Family History  Problem Relation Age of Onset  . Heart disease Brother        CABG age 24, redo bypass ~50  . Heart disease Paternal Grandmother   . Heart disease Cousin        Maternal side  . Heart attack Brother   . Colon cancer Maternal Grandfather 60       METS to colon from Lung CA per pt  . Lung cancer Maternal Grandfather   . Colon polyps Neg Hx   . Esophageal cancer Neg Hx   . Rectal cancer Neg Hx   . Stomach cancer Neg Hx     No Known Allergies  Current Outpatient Medications on File Prior to Visit  Medication Sig Dispense Refill  . aspirin EC 81 MG tablet Take 1 tablet (81 mg total) by mouth daily.    Marland Kitchen atorvastatin (LIPITOR) 40 MG tablet TAKE 1 TABLET BY MOUTH AT BEDTIME 90 tablet 0  . Cholecalciferol (D3) 50 MCG (2000 UT) TABS Take by mouth.    . citalopram  (CELEXA) 10 MG tablet TAKE ONE TABLET BY MOUTH DAILY 30 tablet 0  . Coenzyme Q10 200 MG capsule Take 200 mg by mouth daily with breakfast.     . diltiazem (CARDIZEM) 30 MG tablet Take 1 tablet (30 mg total) by mouth 4 (four) times daily as needed (elevated heart rates). 30 tablet 3  . fluticasone (FLONASE) 50 MCG/ACT nasal spray Place 2 sprays into both nostrils as needed for allergies or rhinitis.    Marland Kitchen ibuprofen (ADVIL,MOTRIN) 200 MG tablet Take 400-600 mg by mouth every 6 (six) hours as needed for headache or moderate pain.     . isosorbide mononitrate (IMDUR) 30 MG 24 hr tablet TAKE ONE TABLET BY MOUTH DAILY 90 tablet 3  . losartan (COZAAR) 100 MG tablet TAKE ONE TABLET BY MOUTH DAILY 90 tablet 3  . nitroGLYCERIN (NITROSTAT) 0.4 MG SL tablet Place 1 tablet (0.4 mg total) under the tongue every 5 (  five) minutes as needed for chest pain. 25 tablet 3  . omeprazole (PRILOSEC OTC) 20 MG tablet Take 20 mg by mouth daily as needed (acid reflux).     . sotalol (BETAPACE) 120 MG tablet TAKE 1 TABLET BY MOUTH EVERY 12 HOURS 180 tablet 3  . vitamin B-12 (CYANOCOBALAMIN) 1000 MCG tablet Take 1,000 mcg by mouth daily.    Marland Kitchen amLODipine (NORVASC) 5 MG tablet Take 1 tablet (5 mg total) by mouth daily. 90 tablet 3   No current facility-administered medications on file prior to visit.    BP 120/78   Temp 97.7 F (36.5 C) (Oral)   Ht 6' (1.829 m) Comment: WITH SHOES  Wt 198 lb (89.8 kg)   BMI 26.85 kg/m       Objective:   Physical Exam Vitals and nursing note reviewed.  Constitutional:      Appearance: Normal appearance.  Cardiovascular:     Rate and Rhythm: Normal rate and regular rhythm.     Pulses: Normal pulses.     Heart sounds: Normal heart sounds.  Pulmonary:     Effort: Pulmonary effort is normal.     Breath sounds: Normal breath sounds.  Musculoskeletal:        General: Normal range of motion.  Skin:    General: Skin is warm and dry.     Capillary Refill: Capillary refill takes less  than 2 seconds.  Neurological:     General: No focal deficit present.     Mental Status: He is alert and oriented to person, place, and time.  Psychiatric:        Mood and Affect: Mood normal.        Behavior: Behavior normal.        Thought Content: Thought content normal.        Judgment: Judgment normal.       Assessment & Plan:  1. Essential hypertension - well controlled. No change in medications  - Follow up in one year or sooner if needed - Work on exercising more and eating heart healthy diet  - CBC with Differential/Platelet; Future - Comprehensive metabolic panel; Future - Lipid panel; Future - TSH; Future  2. Coronary artery disease involving native coronary artery of native heart without angina pectoris - Follow up with Cardiology plan of care - CBC with Differential/Platelet; Future - Comprehensive metabolic panel; Future - Lipid panel; Future - TSH; Future  3. Elevated PSA  - PSA; Future  4. Anxiety and depression - citalopram (CELEXA) 10 MG tablet; Take 1 tablet (10 mg total) by mouth daily.  Dispense: 90 tablet; Refill: 1  5. Atrial tachycardia ([place]) - Continue with Cardiology plan of care  - CBC with Differential/Platelet; Future - Comprehensive metabolic panel; Future - Lipid panel; Future - TSH; Future  6. Hyperlipidemia, unspecified hyperlipidemia type - Continue with statin  - CBC with Differential/Platelet; Future - Comprehensive metabolic panel; Future - Lipid panel; Future - TSH; Future  [name], NP

## 2020-09-05 ENCOUNTER — Other Ambulatory Visit: Payer: Self-pay

## 2020-09-06 ENCOUNTER — Encounter: Payer: Self-pay | Admitting: Adult Health

## 2020-09-06 ENCOUNTER — Ambulatory Visit (INDEPENDENT_AMBULATORY_CARE_PROVIDER_SITE_OTHER): Payer: Medicare Other | Admitting: Adult Health

## 2020-09-06 VITALS — BP 130/80 | Temp 97.7°F | Wt 199.0 lb

## 2020-09-06 DIAGNOSIS — Z23 Encounter for immunization: Secondary | ICD-10-CM

## 2020-09-06 DIAGNOSIS — I2510 Atherosclerotic heart disease of native coronary artery without angina pectoris: Secondary | ICD-10-CM | POA: Diagnosis not present

## 2020-09-06 DIAGNOSIS — L821 Other seborrheic keratosis: Secondary | ICD-10-CM | POA: Diagnosis not present

## 2020-09-06 NOTE — Addendum Note (Signed)
Addended by: Miles Costain T on: 09/06/2020 10:56 AM   Modules accepted: Orders

## 2020-09-06 NOTE — Progress Notes (Signed)
Subjective:    Patient ID: Terry Hansen, male    DOB: 15-Jul-1956, 65 y.o.   MRN: [id number]  HPI 65 year old male who presents to the office today for an acute issue.  He has a skin neoplasm on his right dorsal hand as well as on the side of his right neck that are irritated by clothing.  He would like these removed.   Review of Systems See HPI   Past Medical History:  Diagnosis Date  . CAD (coronary artery disease)    a.  LHC 06/26/12:  pLAD 90%, prox/mid/dist CFS 20-30%, oRCA 100% (non dom), EF 55%;  b. Echo 12/13:  EF 55-60%, Gr 1 diast dysfn;  c. s/p CABG [facility] Hockey) 12/13: L-LAD/Dx    . GERD (gastroesophageal reflux disease)   . HLD (hyperlipidemia)   . Hypertension   . Ocular migraine    "maybe once q couple months" (06/02/2014)  . Unstable angina pectoris [facility])     Social History   Socioeconomic History  . Marital status: Divorced    Spouse name: Not on file  . Number of children: Not on file  . Years of education: Not on file  . Highest education level: Not on file  Occupational History  . Occupation: retired  Tobacco Use  . Smoking status: Never Smoker  . Smokeless tobacco: Never Used  Vaping Use  . Vaping Use: Never used  Substance and Sexual Activity  . Alcohol use: Yes    Comment: occ-Social per pt  . Drug use: No  . Sexual activity: Not Currently  Other Topics Concern  . Not on file  Social History Narrative   Divorced 7 years ago   3 children   Works in Aeronautical engineer   Social Determinants of Radio broadcast assistant Strain: Not on file  Food Insecurity: Not on file  Transportation Needs: Not on file  Physical Activity: Not on file  Stress: Not on file  Social Connections: Not on file  Intimate Partner Violence: Not on file    Past Surgical History:  Procedure Laterality Date  . APPENDECTOMY  1970's  . CARDIAC CATHETERIZATION  06/2012  . CORONARY ARTERY BYPASS GRAFT  06/29/2012   Procedure: CORONARY ARTERY BYPASS GRAFTING  (CABG);  Surgeon: Melrose Nakayama, MD;  Location: [place];  Service: Open Heart Surgery;  Laterality: N/A;  times two using Left Internal Mammary Artery  . INGUINAL HERNIA REPAIR Bilateral [phone]  . LEFT HEART CATH AND CORS/GRAFTS ANGIOGRAPHY N/A 08/16/2019   Procedure: LEFT HEART CATH AND CORS/GRAFTS ANGIOGRAPHY;  Surgeon: Leonie Man, MD;  Location: [place] CV LAB;  Service: Cardiovascular;  Laterality: N/A;  . LEFT HEART CATHETERIZATION WITH CORONARY ANGIOGRAM N/A 06/26/2012   Procedure: LEFT HEART CATHETERIZATION WITH CORONARY ANGIOGRAM;  Surgeon: Josue Hector, MD;  Location[facility] CATH LAB;  Service: Cardiovascular;  Laterality: N/A;  . LEFT HEART CATHETERIZATION WITH CORONARY/GRAFT ANGIOGRAM N/A 06/03/2014   Procedure: LEFT HEART CATHETERIZATION WITH Beatrix Fetters;  Surgeon: Jettie Booze, MD;  Location: [facility] CATH LAB;  Service: Cardiovascular;  Laterality: N/A;  . NASAL SEPTUM SURGERY     For uncontrolled epistaxis early 2013  . SIGMOIDOSCOPY  10-15 years ago   in [place], [place]    Family History  Problem Relation Age of Onset  . Heart disease Brother        CABG age 50, redo bypass ~50  . Heart disease Paternal Grandmother   . Heart disease Cousin  Maternal side  . Heart attack Brother   . Colon cancer Maternal Grandfather 60       METS to colon from Lung CA per pt  . Lung cancer Maternal Grandfather   . Colon polyps Neg Hx   . Esophageal cancer Neg Hx   . Rectal cancer Neg Hx   . Stomach cancer Neg Hx     No Known Allergies  Current Outpatient Medications on File Prior to Visit  Medication Sig Dispense Refill  . aspirin EC 81 MG tablet Take 1 tablet (81 mg total) by mouth daily.    Marland Kitchen atorvastatin (LIPITOR) 40 MG tablet TAKE 1 TABLET BY MOUTH AT BEDTIME 90 tablet 0  . Cholecalciferol (D3) 50 MCG (2000 UT) TABS Take by mouth.    . citalopram (CELEXA) 10 MG tablet Take 1 tablet (10 mg total) by mouth daily. 90 tablet 1  . Coenzyme Q10 200  MG capsule Take 200 mg by mouth daily with breakfast.     . diltiazem (CARDIZEM) 30 MG tablet Take 1 tablet (30 mg total) by mouth 4 (four) times daily as needed (elevated heart rates). 30 tablet 3  . fluticasone (FLONASE) 50 MCG/ACT nasal spray Place 2 sprays into both nostrils as needed for allergies or rhinitis.    Marland Kitchen ibuprofen (ADVIL,MOTRIN) 200 MG tablet Take 400-600 mg by mouth every 6 (six) hours as needed for headache or moderate pain.     . isosorbide mononitrate (IMDUR) 30 MG 24 hr tablet TAKE ONE TABLET BY MOUTH DAILY 90 tablet 3  . losartan (COZAAR) 100 MG tablet TAKE ONE TABLET BY MOUTH DAILY 90 tablet 3  . nitroGLYCERIN (NITROSTAT) 0.4 MG SL tablet Place 1 tablet (0.4 mg total) under the tongue every 5 (five) minutes as needed for chest pain. 25 tablet 3  . omeprazole (PRILOSEC OTC) 20 MG tablet Take 20 mg by mouth daily as needed (acid reflux).     . sotalol (BETAPACE) 120 MG tablet TAKE 1 TABLET BY MOUTH EVERY 12 HOURS 180 tablet 3  . vitamin B-12 (CYANOCOBALAMIN) 1000 MCG tablet Take 1,000 mcg by mouth daily.    Marland Kitchen amLODipine (NORVASC) 5 MG tablet Take 1 tablet (5 mg total) by mouth daily. 90 tablet 3   No current facility-administered medications on file prior to visit.    BP 130/80   Temp 97.7 F (36.5 C)   Wt 199 lb (90.3 kg)   BMI 26.99 kg/m       Objective:   Physical Exam Vitals and nursing note reviewed.  Constitutional:      Appearance: Normal appearance.  Skin:    General: Skin is warm and dry.     Comments: Has two benign appearing keratoses noted  Neurological:     General: No focal deficit present.     Mental Status: He is alert and oriented to person, place, and time.  Psychiatric:        Mood and Affect: Mood normal.        Behavior: Behavior normal.        Thought Content: Thought content normal.        Judgment: Judgment normal.       Assessment & Plan:  1. Seborrheic keratoses -Verbal consent received for cryotherapy.  Using 3 freeze thaw  cycles, both Seborrheic keratoses were burned off. Patient tolerated procedure well. After care reviewed  [name], NP

## 2020-09-26 ENCOUNTER — Other Ambulatory Visit: Payer: Self-pay | Admitting: Cardiovascular Disease

## 2020-10-02 ENCOUNTER — Telehealth: Payer: Self-pay | Admitting: Adult Health

## 2020-10-02 NOTE — Telephone Encounter (Signed)
Pt call and stated he want a call back to talk about coming off his Citalopram .

## 2020-10-04 NOTE — Telephone Encounter (Signed)
Called pt to discuss the below. Pt verbalized the understanding.

## 2020-10-04 NOTE — Telephone Encounter (Signed)
He can start weaning himself off  Start with skipping 1 day between doses for 1 week For the second week cut the pill in half to 5 mg and skip 1-2 day between doses For the third week, skip 2 to 3 days between doses Fourth week he should be fine quitting the medication completely  If he develops any withdrawal symptoms such as headaches, worsening anxiety, shakiness, etc then he can take one half a tab

## 2020-10-04 NOTE — Telephone Encounter (Signed)
Does pt need a visit for this?

## 2020-10-11 ENCOUNTER — Other Ambulatory Visit: Payer: Self-pay

## 2020-10-11 ENCOUNTER — Other Ambulatory Visit: Payer: Self-pay | Admitting: Adult Health

## 2020-10-11 MED ORDER — SOTALOL HCL 120 MG PO TABS: 120.0000 mg | ORAL_TABLET | Freq: Two times a day (BID) | ORAL | 3 refills | Status: DC

## 2020-11-16 ENCOUNTER — Other Ambulatory Visit: Payer: Self-pay

## 2020-11-16 ENCOUNTER — Telehealth (INDEPENDENT_AMBULATORY_CARE_PROVIDER_SITE_OTHER): Payer: Medicare Other | Admitting: Adult Health

## 2020-11-16 ENCOUNTER — Encounter: Payer: Self-pay | Admitting: Adult Health

## 2020-11-16 DIAGNOSIS — J302 Other seasonal allergic rhinitis: Secondary | ICD-10-CM | POA: Diagnosis not present

## 2020-11-16 DIAGNOSIS — I2510 Atherosclerotic heart disease of native coronary artery without angina pectoris: Secondary | ICD-10-CM

## 2020-11-16 DIAGNOSIS — H6981 Other specified disorders of Eustachian tube, right ear: Secondary | ICD-10-CM

## 2020-11-16 MED ORDER — FLUTICASONE PROPIONATE 50 MCG/ACT NA SUSP
2.0000 | NASAL | 3 refills | Status: DC | PRN
Start: 2020-11-16 — End: 2021-07-18

## 2020-11-16 NOTE — Progress Notes (Signed)
Virtual Visit via Video Note  I connected with Terry Hansen on 11/16/20 at 11:00 AM EDT by a video enabled telemedicine application and verified that I am speaking with the correct person using two identifiers.  Location patient: home Location provider:work or home office Persons participating in the virtual visit: patient, provider  I discussed the limitations of evaluation and management by telemedicine and the availability of in person appointments. The patient expressed understanding and agreed to proceed.   HPI: This 65 year old male who is being evaluated today for an acute issue.  Symptoms started 5 to 6 days ago.  Symptoms include stuffy nose, productive cough, and he had 3 to 4 days of laryngitis which is now resolved.  Additionally he does report some right sided upper jaw/ear pain.  He denies fevers, chills and does not feel acutely ill.  Has been using vitamin C and ibuprofen at home but no allergy medication.  Feels as though his symptoms are slowly improving   ROS: See pertinent positives and negatives per HPI.  Past Medical History:  Diagnosis Date  . CAD (coronary artery disease)    a.  LHC 06/26/12:  pLAD 90%, prox/mid/dist CFS 20-30%, oRCA 100% (non dom), EF 55%;  b. Echo 12/13:  EF 55-60%, Gr 1 diast dysfn;  c. s/p CABG [facility]) 12/13: L-LAD/Dx    . GERD (gastroesophageal reflux disease)   . HLD (hyperlipidemia)   . Hypertension   . Ocular migraine    "maybe once q couple months" (06/02/2014)  . Unstable angina pectoris [facility])     Past Surgical History:  Procedure Laterality Date  . APPENDECTOMY  1970's  . CARDIAC CATHETERIZATION  06/2012  . CORONARY ARTERY BYPASS GRAFT  06/29/2012   Procedure: CORONARY ARTERY BYPASS GRAFTING (CABG);  Surgeon: Melrose Nakayama, MD;  Location: [place];  Service: Open Heart Surgery;  Laterality: N/A;  times two using Left Internal Mammary Artery  . INGUINAL HERNIA REPAIR Bilateral [phone]  . LEFT HEART CATH AND CORS/GRAFTS  ANGIOGRAPHY N/A 08/16/2019   Procedure: LEFT HEART CATH AND CORS/GRAFTS ANGIOGRAPHY;  Surgeon: Leonie Man, MD;  Location: [place] [facility];  Service: Cardiovascular;  Laterality: N/A;  . LEFT HEART CATHETERIZATION WITH CORONARY ANGIOGRAM N/A 06/26/2012   Procedure: LEFT HEART CATHETERIZATION WITH CORONARY ANGIOGRAM;  Surgeon: Josue Hector, MD;  Location: [facility] CATH LAB;  Service: Cardiovascular;  Laterality: N/A;  . LEFT HEART CATHETERIZATION WITH CORONARY/GRAFT ANGIOGRAM N/A 06/03/2014   Procedure: LEFT HEART CATHETERIZATION WITH Beatrix Fetters;  Surgeon: Jettie Booze, MD;  Location: [facility] CATH LAB;  Service: Cardiovascular;  Laterality: N/A;  . NASAL SEPTUM SURGERY     For uncontrolled epistaxis early 2013  . SIGMOIDOSCOPY  10-15 years ago   in [place], [place]    Family History  Problem Relation Age of Onset  . Heart disease Brother        CABG age 46, redo bypass ~50  . Heart disease Paternal Grandmother   . Heart disease Cousin        Maternal side  . Heart attack Brother   . Colon cancer Maternal Grandfather 60       METS to colon from Lung CA per pt  . Lung cancer Maternal Grandfather   . Colon polyps Neg Hx   . Esophageal cancer Neg Hx   . Rectal cancer Neg Hx   . Stomach cancer Neg Hx        Current Outpatient Medications:  .  amLODipine (NORVASC) 5 MG  tablet, TAKE ONE TABLET BY MOUTH DAILY, Disp: 90 tablet, Rfl: 1 .  aspirin EC 81 MG tablet, Take 1 tablet (81 mg total) by mouth daily., Disp: , Rfl:  .  atorvastatin (LIPITOR) 40 MG tablet, TAKE ONE TABLET BY MOUTH AT BEDTIME, Disp: 90 tablet, Rfl: 0 .  Cholecalciferol (D3) 50 MCG (2000 UT) TABS, Take by mouth., Disp: , Rfl:  .  citalopram (CELEXA) 10 MG tablet, Take 1 tablet (10 mg total) by mouth daily., Disp: 90 tablet, Rfl: 1 .  Coenzyme Q10 200 MG capsule, Take 200 mg by mouth daily with breakfast. , Disp: , Rfl:  .  diltiazem (CARDIZEM) 30 MG tablet, Take 1 tablet (30 mg total) by mouth 4 (four)  times daily as needed (elevated heart rates)., Disp: 30 tablet, Rfl: 3 .  fluticasone (FLONASE) 50 MCG/ACT nasal spray, Place 2 sprays into both nostrils as needed for allergies or rhinitis., Disp: , Rfl:  .  ibuprofen (ADVIL,MOTRIN) 200 MG tablet, Take 400-600 mg by mouth every 6 (six) hours as needed for headache or moderate pain. , Disp: , Rfl:  .  isosorbide mononitrate (IMDUR) 30 MG 24 hr tablet, TAKE ONE TABLET BY MOUTH DAILY, Disp: 90 tablet, Rfl: 3 .  losartan (COZAAR) 100 MG tablet, TAKE ONE TABLET BY MOUTH DAILY, Disp: 90 tablet, Rfl: 3 .  nitroGLYCERIN (NITROSTAT) 0.4 MG SL tablet, Place 1 tablet (0.4 mg total) under the tongue every 5 (five) minutes as needed for chest pain., Disp: 25 tablet, Rfl: 3 .  omeprazole (PRILOSEC OTC) 20 MG tablet, Take 20 mg by mouth daily as needed (acid reflux). , Disp: , Rfl:  .  sotalol (BETAPACE) 120 MG tablet, Take 1 tablet (120 mg total) by mouth every 12 (twelve) hours., Disp: 180 tablet, Rfl: 3 .  vitamin B-12 (CYANOCOBALAMIN) 1000 MCG tablet, Take 1,000 mcg by mouth daily., Disp: , Rfl:   EXAM:  VITALS per patient if applicable:  GENERAL: alert, oriented, appears well and in no acute distress  HEENT: atraumatic, conjunttiva clear, no obvious abnormalities on inspection of external nose and ears  NECK: normal movements of the head and neck  LUNGS: on inspection no signs of respiratory distress, breathing rate appears normal, no obvious gross SOB, gasping or wheezing  CV: no obvious cyanosis  MS: moves all visible extremities without noticeable abnormality  PSYCH/NEURO: pleasant and cooperative, no obvious depression or anxiety, speech and thought processing grossly intact  ASSESSMENT AND PLAN:  Discussed the following assessment and plan:  1. Seasonal allergies - Add zyrtec - fluticasone (FLONASE) 50 MCG/ACT nasal spray; Place 2 sprays into both nostrils as needed for allergies or rhinitis.  Dispense: 15.8 mL; Refill: 3 - Follow up  if no improvement in the next week or sooner if symptoms worsen   2. Dysfunction of right eustachian tube  - fluticasone (FLONASE) 50 MCG/ACT nasal spray; Place 2 sprays into both nostrils as needed for allergies or rhinitis.  Dispense: 15.8 mL; Refill: 3     I discussed the assessment and treatment plan with the patient. The patient was provided an opportunity to ask questions and all were answered. The patient agreed with the plan and demonstrated an understanding of the instructions.   The patient was advised to call back or seek an in-person evaluation if the symptoms worsen or if the condition fails to improve as anticipated.   [name], NP

## 2020-11-19 DIAGNOSIS — J0110 Acute frontal sinusitis, unspecified: Secondary | ICD-10-CM | POA: Diagnosis not present

## 2020-11-30 ENCOUNTER — Telehealth: Payer: Self-pay | Admitting: Adult Health

## 2020-11-30 NOTE — Progress Notes (Signed)
  Chronic Care Management   Outreach Note  11/30/2020 Name: Terry Hansen MRN: [id number] DOB: 08-Jan-1956  Referred by: [name], NP Reason for referral : No chief complaint on file.   An unsuccessful telephone outreach was attempted today. The patient was referred to the pharmacist for assistance with care management and care coordination.   Follow Up Plan:   Carley Perdue [employer] Scheduler

## 2020-12-01 ENCOUNTER — Telehealth: Payer: Self-pay | Admitting: Adult Health

## 2020-12-01 NOTE — Progress Notes (Signed)
  Chronic Care Management   Note  12/01/2020 Name: Terry Hansen MRN: [id number] DOB: 1955-09-28  Terry Hansen is a 65 y.o. year old male who is a primary care patient of [name], NP. I reached out to Georgie Chard by phone today in response to a referral sent by [name], [name], NP.   [name] was given information about Chronic Care Management services today including:  1. CCM service includes personalized support from designated clinical staff supervised by his physician, including individualized plan of care and coordination with other care providers 2. 24/7 contact phone numbers for assistance for urgent and routine care needs. 3. Service will only be billed when office clinical staff spend 20 minutes or more in a month to coordinate care. 4. Only one practitioner may furnish and bill the service in a calendar month. 5. The patient may stop CCM services at any time (effective at the end of the month) by phone call to the office staff.   Patient agreed to services and verbal consent obtained.   Follow up plan:   Carley Perdue [employer] Scheduler

## 2020-12-12 ENCOUNTER — Other Ambulatory Visit: Payer: Self-pay

## 2020-12-12 ENCOUNTER — Ambulatory Visit (INDEPENDENT_AMBULATORY_CARE_PROVIDER_SITE_OTHER): Payer: Medicare Other

## 2020-12-12 VITALS — BP 130/78 | HR 57 | Temp 98.7°F | Resp 20 | Wt 197.8 lb

## 2020-12-12 DIAGNOSIS — Z0000 Encounter for general adult medical examination without abnormal findings: Secondary | ICD-10-CM

## 2020-12-12 NOTE — Patient Instructions (Signed)
[name] , Thank you for taking time to come for your Medicare Wellness Visit. I appreciate your ongoing commitment to your health goals. Please review the following plan we discussed and let me know if I can assist you in the future.   Screening recommendations/referrals: Colonoscopy: Done 03/05/19 Recommended yearly ophthalmology/optometry visit for glaucoma screening and checkup Recommended yearly dental visit for hygiene and checkup  Vaccinations: Influenza vaccine: Up to date Pneumococcal vaccine: Due and discussed Tdap vaccine: Due and discussed Shingles vaccine: Shingrix discussed. Please contact your pharmacy for coverage information.    Covid-19: Declined  Advanced directives: Please bring a copy of your health care power of attorney and living will to the office at your convenience.  Conditions/risks identified: Stay healthy   Next appointment: Follow up in one year for your annual wellness visit.   Preventive Care 67 Years and Older, Male Preventive care refers to lifestyle choices and visits with your health care provider that can promote health and wellness. What does preventive care include?  A yearly physical exam. This is also called an annual well check.  Dental exams once or twice a year.  Routine eye exams. Ask your health care provider how often you should have your eyes checked.  Personal lifestyle choices, including:  Daily care of your teeth and gums.  Regular physical activity.  Eating a healthy diet.  Avoiding tobacco and drug use.  Limiting alcohol use.  Practicing safe sex.  Taking low doses of aspirin every day.  Taking vitamin and mineral supplements as recommended by your health care provider. What happens during an annual well check? The services and screenings done by your health care provider during your annual well check will depend on your age, overall health, lifestyle risk factors, and family history of disease. Counseling  Your  health care provider may ask you questions about your:  Alcohol use.  Tobacco use.  Drug use.  Emotional well-being.  Home and relationship well-being.  Sexual activity.  Eating habits.  History of falls.  Memory and ability to understand (cognition).  Work and work Statistician. Screening  You may have the following tests or measurements:  Height, weight, and BMI.  Blood pressure.  Lipid and cholesterol levels. These may be checked every 5 years, or more frequently if you are over 67 years old.  Skin check.  Lung cancer screening. You may have this screening every year starting at age 67 if you have a 30-pack-year history of smoking and currently smoke or have quit within the past 15 years.  Fecal occult blood test (FOBT) of the stool. You may have this test every year starting at age 67.  Flexible sigmoidoscopy or colonoscopy. You may have a sigmoidoscopy every 5 years or a colonoscopy every 10 years starting at age 67.  Prostate cancer screening. Recommendations will vary depending on your family history and other risks.  Hepatitis C blood test.  Hepatitis B blood test.  Sexually transmitted disease (STD) testing.  Diabetes screening. This is done by checking your blood sugar (glucose) after you have not eaten for a while (fasting). You may have this done every 1-3 years.  Abdominal aortic aneurysm (AAA) screening. You may need this if you are a current or former smoker.  Osteoporosis. You may be screened starting at age 67 if you are at high risk. Talk with your health care provider about your test results, treatment options, and if necessary, the need for more tests. Vaccines  Your health care provider may  recommend certain vaccines, such as:  Influenza vaccine. This is recommended every year.  Tetanus, diphtheria, and acellular pertussis (Tdap, Td) vaccine. You may need a Td booster every 10 years.  Zoster vaccine. You may need this after age  67.  Pneumococcal 13-valent conjugate (PCV13) vaccine. One dose is recommended after age 67.  Pneumococcal polysaccharide (PPSV23) vaccine. One dose is recommended after age 67. Talk to your health care provider about which screenings and vaccines you need and how often you need them. This information is not intended to replace advice given to you by your health care provider. Make sure you discuss any questions you have with your health care provider. Document Released: 08/04/2015 Document Revised: 03/27/2016 Document Reviewed: 05/09/2015 Elsevier Interactive Patient Education  2017 [place] Prevention in the Home Falls can cause injuries. They can happen to people of all ages. There are many things you can do to make your home safe and to help prevent falls. What can I do on the outside of my home?  Regularly fix the edges of walkways and driveways and fix any cracks.  Remove anything that might make you trip as you walk through a door, such as a raised step or threshold.  Trim any bushes or trees on the path to your home.  Use bright outdoor lighting.  Clear any walking paths of anything that might make someone trip, such as rocks or tools.  Regularly check to see if handrails are loose or broken. Make sure that both sides of any steps have handrails.  Any raised decks and porches should have guardrails on the edges.  Have any leaves, snow, or ice cleared regularly.  Use sand or salt on walking paths during winter.  Clean up any spills in your garage right away. This includes oil or grease spills. What can I do in the bathroom?  Use night lights.  Install grab bars by the toilet and in the tub and shower. Do not use towel bars as grab bars.  Use non-skid mats or decals in the tub or shower.  If you need to sit down in the shower, use a plastic, non-slip stool.  Keep the floor dry. Clean up any water that spills on the floor as soon as it happens.  Remove  soap buildup in the tub or shower regularly.  Attach bath mats securely with double-sided non-slip rug tape.  Do not have throw rugs and other things on the floor that can make you trip. What can I do in the bedroom?  Use night lights.  Make sure that you have a light by your bed that is easy to reach.  Do not use any sheets or blankets that are too big for your bed. They should not hang down onto the floor.  Have a firm chair that has side arms. You can use this for support while you get dressed.  Do not have throw rugs and other things on the floor that can make you trip. What can I do in the kitchen?  Clean up any spills right away.  Avoid walking on wet floors.  Keep items that you use a lot in easy-to-reach places.  If you need to reach something above you, use a strong step stool that has a grab bar.  Keep electrical cords out of the way.  Do not use floor polish or wax that makes floors slippery. If you must use wax, use non-skid floor wax.  Do not have throw rugs and  other things on the floor that can make you trip. What can I do with my stairs?  Do not leave any items on the stairs.  Make sure that there are handrails on both sides of the stairs and use them. Fix handrails that are broken or loose. Make sure that handrails are as long as the stairways.  Check any carpeting to make sure that it is firmly attached to the stairs. Fix any carpet that is loose or worn.  Avoid having throw rugs at the top or bottom of the stairs. If you do have throw rugs, attach them to the floor with carpet tape.  Make sure that you have a light switch at the top of the stairs and the bottom of the stairs. If you do not have them, ask someone to add them for you. What else can I do to help prevent falls?  Wear shoes that:  Do not have high heels.  Have rubber bottoms.  Are comfortable and fit you well.  Are closed at the toe. Do not wear sandals.  If you use a  stepladder:  Make sure that it is fully opened. Do not climb a closed stepladder.  Make sure that both sides of the stepladder are locked into place.  Ask someone to hold it for you, if possible.  Clearly mark and make sure that you can see:  Any grab bars or handrails.  First and last steps.  Where the edge of each step is.  Use tools that help you move around (mobility aids) if they are needed. These include:  Canes.  Walkers.  Scooters.  Crutches.  Turn on the lights when you go into a dark area. Replace any light bulbs as soon as they burn out.  Set up your furniture so you have a clear path. Avoid moving your furniture around.  If any of your floors are uneven, fix them.  If there are any pets around you, be aware of where they are.  Review your medicines with your doctor. Some medicines can make you feel dizzy. This can increase your chance of falling. Ask your doctor what other things that you can do to help prevent falls. This information is not intended to replace advice given to you by your health care provider. Make sure you discuss any questions you have with your health care provider. Document Released: 05/04/2009 Document Revised: 12/14/2015 Document Reviewed: 08/12/2014 Elsevier Interactive Patient Education  2017 [employer].

## 2020-12-12 NOTE — Progress Notes (Signed)
Subjective:   Terry Hansen is a 65 y.o. male who presents for an Initial Medicare Annual Wellness Visit.  Review of Systems     Cardiac Risk Factors include: advanced age (>61men, >11 women);hypertension;dyslipidemia;male gender     Objective:    Today's Vitals   12/12/20 1020  BP: 130/78  Pulse: (!) 57  Resp: 20  Temp: 98.7 F (37.1 C)  SpO2: 98%  Weight: 197 lb 12.8 oz (89.7 kg)   Body mass index is 26.83 kg/m.  Advanced Directives 12/12/2020 08/16/2019 07/19/2019 07/19/2019 07/10/2019 04/08/2019 04/08/2019  Does Patient Have a Medical Advance Directive? Yes Yes No No No Yes No  Type of Advance Directive Living will [place];Living will - - - [place];Living will -  Does patient want to make changes to medical advance directive? - - - - - No - Patient declined -  Copy of [place] in Chart? - - - - - No - copy requested -  Would patient like information on creating a medical advance directive? - - No - Patient declined No - Patient declined No - Guardian declined No - Patient declined No - Patient declined  Pre-existing out of facility DNR order (yellow form or pink MOST form) - - - - - - -    Current Medications (verified) Outpatient Encounter Medications as of 12/12/2020  Medication Sig  . amLODipine (NORVASC) 5 MG tablet TAKE ONE TABLET BY MOUTH DAILY  . aspirin EC 81 MG tablet Take 1 tablet (81 mg total) by mouth daily.  Marland Kitchen atorvastatin (LIPITOR) 40 MG tablet TAKE ONE TABLET BY MOUTH AT BEDTIME  . Cholecalciferol (D3) 50 MCG (2000 UT) TABS Take by mouth.  . Coenzyme Q10 200 MG capsule Take 200 mg by mouth daily with breakfast.   . diltiazem (CARDIZEM) 30 MG tablet Take 1 tablet (30 mg total) by mouth 4 (four) times daily as needed (elevated heart rates).  . fluticasone (FLONASE) 50 MCG/ACT nasal spray Place 2 sprays into both nostrils as needed for allergies or rhinitis.  Marland Kitchen ibuprofen (ADVIL,MOTRIN) 200 MG  tablet Take 400-600 mg by mouth every 6 (six) hours as needed for headache or moderate pain.   . isosorbide mononitrate (IMDUR) 30 MG 24 hr tablet TAKE ONE TABLET BY MOUTH DAILY  . losartan (COZAAR) 100 MG tablet TAKE ONE TABLET BY MOUTH DAILY  . nitroGLYCERIN (NITROSTAT) 0.4 MG SL tablet Place 1 tablet (0.4 mg total) under the tongue every 5 (five) minutes as needed for chest pain.  Marland Kitchen omeprazole (PRILOSEC OTC) 20 MG tablet Take 20 mg by mouth daily as needed (acid reflux).   . sotalol (BETAPACE) 120 MG tablet Take 1 tablet (120 mg total) by mouth every 12 (twelve) hours.  . vitamin B-12 (CYANOCOBALAMIN) 1000 MCG tablet Take 1,000 mcg by mouth daily.  . [DISCONTINUED] citalopram (CELEXA) 10 MG tablet Take 1 tablet (10 mg total) by mouth daily. (Patient not taking: Reported on 12/12/2020)   No facility-administered encounter medications on file as of 12/12/2020.    Allergies (verified) Patient has no known allergies.   History: Past Medical History:  Diagnosis Date  . CAD (coronary artery disease)    a.  LHC 06/26/12:  pLAD 90%, prox/mid/dist CFS 20-30%, oRCA 100% (non dom), EF 55%;  b. Echo 12/13:  EF 55-60%, Gr 1 diast dysfn;  c. s/p CABG [facility]) 12/13: L-LAD/Dx    . GERD (gastroesophageal reflux disease)   . HLD (hyperlipidemia)   .  Hypertension   . Ocular migraine    "maybe once q couple months" (06/02/2014)  . Unstable angina pectoris [facility])    Past Surgical History:  Procedure Laterality Date  . APPENDECTOMY  1970's  . CARDIAC CATHETERIZATION  06/2012  . CORONARY ARTERY BYPASS GRAFT  06/29/2012   Procedure: CORONARY ARTERY BYPASS GRAFTING (CABG);  Surgeon: Melrose Nakayama, MD;  Location: [facility];  Service: Open Heart Surgery;  Laterality: N/A;  times two using Left Internal Mammary Artery  . INGUINAL HERNIA REPAIR Bilateral [phone]  . LEFT HEART CATH AND CORS/GRAFTS ANGIOGRAPHY N/A 08/16/2019   Procedure: LEFT HEART CATH AND CORS/GRAFTS ANGIOGRAPHY;  Surgeon: Leonie Man, MD;  Location: [facility];  Service: Cardiovascular;  Laterality: N/A;  . LEFT HEART CATHETERIZATION WITH CORONARY ANGIOGRAM N/A 06/26/2012   Procedure: LEFT HEART CATHETERIZATION WITH CORONARY ANGIOGRAM;  Surgeon: Josue Hector, MD;  Location: [facility] CATH LAB;  Service: Cardiovascular;  Laterality: N/A;  . LEFT HEART CATHETERIZATION WITH CORONARY/GRAFT ANGIOGRAM N/A 06/03/2014   Procedure: LEFT HEART CATHETERIZATION WITH Beatrix Fetters;  Surgeon: Jettie Booze, MD;  Location: [facility] CATH LAB;  Service: Cardiovascular;  Laterality: N/A;  . NASAL SEPTUM SURGERY     For uncontrolled epistaxis early 2013  . SIGMOIDOSCOPY  10-15 years ago   in [place], [place]   Family History  Problem Relation Age of Onset  . Heart disease Brother        CABG age 59, redo bypass ~50  . Heart disease Paternal Grandmother   . Heart disease Cousin        Maternal side  . Heart attack Brother   . Colon cancer Maternal Grandfather 60       METS to colon from Lung CA per pt  . Lung cancer Maternal Grandfather   . Colon polyps Neg Hx   . Esophageal cancer Neg Hx   . Rectal cancer Neg Hx   . Stomach cancer Neg Hx    Social History   Socioeconomic History  . Marital status: Divorced    Spouse name: Not on file  . Number of children: Not on file  . Years of education: Not on file  . Highest education level: Not on file  Occupational History  . Occupation: retired  Tobacco Use  . Smoking status: Never Smoker  . Smokeless tobacco: Never Used  Vaping Use  . Vaping Use: Never used  Substance and Sexual Activity  . Alcohol use: Yes    Comment: occ-Social per pt  . Drug use: No  . Sexual activity: Not Currently  Other Topics Concern  . Not on file  Social History Narrative   Divorced 7 years ago   3 children   Works in [place] Strain: [place]   . Difficulty of Paying Living Expenses: Not hard at all  Food  Insecurity: No Food Insecurity  . Worried About Charity fundraiser in the Last Year: Never true  . Ran Out of Food in the Last Year: Never true  Transportation Needs: No Transportation Needs  . Lack of Transportation (Medical): No  . Lack of Transportation (Non-Medical): No  Physical Activity: Insufficiently Active  . Days of Exercise per Week: 2 days  . Minutes of Exercise per Session: 60 min  Stress: No Stress Concern Present  . Feeling of Stress : Not at all  Social Connections: Moderately Isolated  . Frequency of Communication with  Friends and Family: More than three times a week  . Frequency of Social Gatherings with Friends and Family: More than three times a week  . Attends Religious Services: 1 to 4 times per year  . Active Member of Clubs or Organizations: No  . Attends Archivist Meetings: Never  . Marital Status: Divorced    Tobacco Counseling Counseling given: Not Answered   Clinical Intake:  Pre-visit preparation completed: Yes  Pain : No/denies pain     BMI - recorded: 26.83 Nutritional Status: BMI 25 -29 Overweight Nutritional Risks: None Diabetes: No  How often do you need to have someone help you when you read instructions, pamphlets, or other written materials from your doctor or pharmacy?: 1 - Never  Diabetic?No  Interpreter Needed?: No  Information entered by :: Charlott Rakes, LPN   Activities of Daily Living In your present state of health, do you have any difficulty performing the following activities: 12/12/2020  Hearing? N  Vision? N  Difficulty concentrating or making decisions? Y  Comment memory at times  Walking or climbing stairs? N  Dressing or bathing? N  Doing errands, shopping? N  Preparing Food and eating ? N  Using the Toilet? N  In the past six months, have you accidently leaked urine? N  Do you have problems with loss of bowel control? N  Managing your Medications? N  Managing your Finances? N  Housekeeping or  managing your Housekeeping? N  Some recent data might be hidden    Patient Care Team: [name], NP as PCP - General (Family Medicine) Josue Hector, MD as PCP - Cardiology (Cardiology) Constance Haw, MD as PCP - Electrophysiology (Cardiology) Verl Blalock, Marijo Conception, MD (Inactive) as Attending Physician (Cardiology) Viona Gilmore, [facility] as Pharmacist (Pharmacist)  Indicate any recent Medical Services you may have received from other than [facility] providers in the past year (date may be approximate).     Assessment:   This is a routine wellness examination for [place].  Hearing/Vision screen  Hearing Screening   125Hz  250Hz  500Hz  1000Hz  2000Hz  3000Hz  4000Hz  6000Hz  8000Hz   Right ear:           Left ear:           Comments: Pt denies any hearing issues   Vision Screening Comments: Pt follows Dr Remo Lipps for annual eye exams   Dietary issues and exercise activities discussed: Current Exercise Habits: Home exercise routine, Type of exercise: walking, Time (Minutes): 60, Frequency (Times/Week): 2, Weekly Exercise (Minutes/Week): 120  Goals Addressed            This Visit's Progress   . Patient Stated       Stay healthy      Depression Screen PHQ 2/9 Scores 12/12/2020 09/01/2020 04/04/2020 01/02/2017 10/24/2014 08/10/2012  PHQ - 2 Score 0 0 0 2 2 2   PHQ- 9 Score - - 0 4 9 -    Fall Risk Fall Risk  12/12/2020 09/01/2020 01/02/2017  Falls in the past year? 0 0 No  Number falls in past yr: 0 - -  Injury with Fall? 0 - -  Risk for fall due to : Impaired vision - -  Follow up Falls prevention discussed - -    FALL RISK PREVENTION PERTAINING TO THE HOME:  Any stairs in or around the home? Yes  If so, are there any without handrails? Yes  Home free of loose throw rugs in walkways, pet beds, electrical cords, etc? Yes  Adequate lighting in your home to reduce risk of falls? Yes   ASSISTIVE DEVICES UTILIZED TO PREVENT FALLS:  Life alert? No  Use of a cane, walker or w/c? No   Grab bars in the bathroom? No  Shower chair or bench in shower? No  Elevated toilet seat or a handicapped toilet? No   TIMED UP AND GO:  Was the test performed? Yes .  Length of time to ambulate 10 feet: 10 sec.   Gait steady and fast without use of assistive device  Cognitive Function:     6CIT Screen 12/12/2020  What Year? 0 points  What month? 0 points  What time? 0 points  Count back from 20 0 points  Months in reverse 0 points  Repeat phrase 2 points  Total Score 2    Immunizations Immunization History  Administered Date(s) Administered  . Fluad Quad(high Dose 65+) 09/06/2020  . Pneumococcal Conjugate-13 06/15/2019    TDAP status: Due, Education has been provided regarding the importance of this vaccine. Advised may receive this vaccine at local pharmacy or Health Dept. Aware to provide a copy of the vaccination record if obtained from local pharmacy or Health Dept. Verbalized acceptance and understanding.  Flu Vaccine status: Up to date  Pneumococcal vaccine status: Due, Education has been provided regarding the importance of this vaccine. Advised may receive this vaccine at local pharmacy or Health Dept. Aware to provide a copy of the vaccination record if obtained from local pharmacy or Health Dept. Verbalized acceptance and understanding.  Covid-19 vaccine status: Declined, Education has been provided regarding the importance of this vaccine but patient still declined. Advised may receive this vaccine at local pharmacy or Health Dept.or vaccine clinic. Aware to provide a copy of the vaccination record if obtained from local pharmacy or Health Dept. Verbalized acceptance and understanding.  Qualifies for Shingles Vaccine? Yes   Zostavax completed No   Shingrix Completed?: No.    Education has been provided regarding the importance of this vaccine. Patient has been advised to call insurance company to determine out of pocket expense if they have not yet received this  vaccine. Advised may also receive vaccine at local pharmacy or Health Dept. Verbalized acceptance and understanding.  Screening Tests Health Maintenance  Topic Date Due  . TETANUS/TDAP  Never done  . COVID-19 Vaccine (1) 12/28/2020 (Originally 08/27/1967)  . PNA vac Low Risk Adult (2 of 2 - PPSV23) 09/01/2021 (Originally 06/14/2020)  . INFLUENZA VACCINE  02/19/2021  . COLONOSCOPY (Pts 45-60yrs Insurance coverage will need to be confirmed)  03/04/2026  . Hepatitis C Screening  Completed  . HPV VACCINES  Aged Out    Health Maintenance  Health Maintenance Due  Topic Date Due  . TETANUS/TDAP  Never done    Colorectal cancer screening: Type of screening: Colonoscopy. Completed 03/05/19. Repeat every 7 years   Additional Screening:  Hepatitis C Screening:  Completed 7/9/8  Vision Screening: Recommended annual ophthalmology exams for early detection of glaucoma and other disorders of the eye. Is the patient up to date with their annual eye exam?  Yes  Who is the provider or what is the name of the office in which the patient attends annual eye exams? Dr Remo Lipps @ [facility] If pt is not established with a provider, would they like to be referred to a provider to establish care? No .   Dental Screening: Recommended annual dental exams for proper oral hygiene  Community Resource Referral / Chronic Care Management: CRR  required this visit?  No   CCM required this visit?  No      Plan:     I have personally reviewed and noted the following in the patient's chart:   . Medical and social history . Use of alcohol, tobacco or illicit drugs  . Current medications and supplements including opioid prescriptions. Patient is not currently taking opioid prescriptions. . Functional ability and status . Nutritional status . Physical activity . Advanced directives . List of other physicians . Hospitalizations, surgeries, and ER visits in previous 12 months . Vitals . Screenings to  include cognitive, depression, and falls . Referrals and appointments  In addition, I have reviewed and discussed with patient certain preventive protocols, quality metrics, and best practice recommendations. A written personalized care plan for preventive services as well as general preventive health recommendations were provided to patient.     Willette Brace, LPN   [id number]   Nurse Notes: None

## 2021-01-03 ENCOUNTER — Telehealth: Payer: Self-pay | Admitting: Pharmacist

## 2021-01-03 NOTE — Chronic Care Management (AMB) (Signed)
Chronic Care Management Pharmacy Assistant   Name: Terry Hansen  MRN: [id number] DOB: 02-Mar-1956  Terry Hansen is an 65 y.o. year old male who presents for his initial CCM visit with the clinical pharmacist.  Reason for Encounter: Chart prep for initial visit on 01/08/21 with Jeni Salles clinical pharmacist.   Conditions to be addressed/monitored: CAD, HTN, HLD, Anxiety, Depression, and Migraine,TIA, Paroxysmal atrial Tachycardia and Asthma.   Primary concerns for visit include:   Recent office visits:  11/16/20 [name] NP (PCP) - seen for seasonal allergies. No medication changes. Follow up as needed.   09/06/20 [name] NP (PCP) - seen for Seborrheic keratosis and need for flu vaccine. No medication changes. Flu Quad High dose 65+ vaccine administered. No follow up noted.   09/01/20 [name] NP (PCP) - seen for essential hypertension and other chronic conditions. Discontinued cyclobenzaprine 10mg . No follow up noted.   07/03/20 [name] NP (PCP) - seen for left leg pain. Patient started on cyclobenzaprine 10mg  3 times daily as needed. Follow up as needed.   Recent consult visits:  07/06/20 Teodoro Spray [facility]) - seen for hypertensive retinopathy, astigmatism and cataracts. No medication changes or follow up noted.   06/20/20 Will [name] MD (Cardiology) -  seen for follow up for SVT. No medication changes. Follow up in 6 months.   Hospital visits:  None in previous 6 months  Fill History:  ATORVASTATIN CALCIUM 40MG  TAB 10/11/2020 90   CITALOPRAM HYDROBROMIDE 10MG  TAB 09/01/2020 90   FLUTICASONE PROPIONATE 50MCG SPR 11/16/2020 30   ISOSORBIDE MONONITRATE ER 30MG  ER TAB 10/11/2020 90   LOSARTAN POTASSIUM 100MG  TAB 11/16/2020 90   SOTALOL HCL 120MG  TAB 10/11/2020 90   AMLODIPINE BESYLATE 5MG  TAB 01/01/2021 90    Medications: Outpatient Encounter Medications as of 01/03/2021  Medication Sig   amLODipine (NORVASC) 5 MG tablet TAKE ONE  TABLET BY MOUTH DAILY   aspirin EC 81 MG tablet Take 1 tablet (81 mg total) by mouth daily.   atorvastatin (LIPITOR) 40 MG tablet TAKE ONE TABLET BY MOUTH AT BEDTIME   Cholecalciferol (D3) 50 MCG (2000 UT) TABS Take by mouth.   Coenzyme Q10 200 MG capsule Take 200 mg by mouth daily with breakfast.    diltiazem (CARDIZEM) 30 MG tablet Take 1 tablet (30 mg total) by mouth 4 (four) times daily as needed (elevated heart rates).   fluticasone (FLONASE) 50 MCG/ACT nasal spray Place 2 sprays into both nostrils as needed for allergies or rhinitis.   ibuprofen (ADVIL,MOTRIN) 200 MG tablet Take 400-600 mg by mouth every 6 (six) hours as needed for headache or moderate pain.    isosorbide mononitrate (IMDUR) 30 MG 24 hr tablet TAKE ONE TABLET BY MOUTH DAILY   losartan (COZAAR) 100 MG tablet TAKE ONE TABLET BY MOUTH DAILY   nitroGLYCERIN (NITROSTAT) 0.4 MG SL tablet Place 1 tablet (0.4 mg total) under the tongue every 5 (five) minutes as needed for chest pain.   omeprazole (PRILOSEC OTC) 20 MG tablet Take 20 mg by mouth daily as needed (acid reflux).    sotalol (BETAPACE) 120 MG tablet Take 1 tablet (120 mg total) by mouth every 12 (twelve) hours.   vitamin B-12 (CYANOCOBALAMIN) 1000 MCG tablet Take 1,000 mcg by mouth daily.   No facility-administered encounter medications on file as of 01/03/2021.    Have you seen any other providers since your last visit?  Any changes in your medications or health?  Any side effects from any medications?  Do you have an symptoms or problems not managed by your medications?  Any concerns about your health right now?  Has your provider asked that you check blood pressure, blood sugar, or follow special diet at home?  Do you get any type of exercise on a regular basis?  Can you think of a goal you would like to reach for your health?  Do you have any problems getting your medications?  Is there anything that you would like to discuss during the appointment?   Please  bring medications and supplements to appointment   Called patient who stated he needs to cancel this appointment due to moving and does not wish to reschedule at this time.   Star Rating Drugs:  Atorvastatin 40mg  - last filled on 10/11/20 90DS at [facility]. Losartan 100mg  - last filled on 11/16/20 90DS at [place] Pharmacist Assistant [phone]

## 2021-01-08 ENCOUNTER — Ambulatory Visit: Payer: Medicare Other

## 2021-01-09 ENCOUNTER — Other Ambulatory Visit: Payer: Self-pay | Admitting: Adult Health

## 2021-02-13 ENCOUNTER — Encounter: Payer: Self-pay | Admitting: Cardiology

## 2021-02-13 ENCOUNTER — Other Ambulatory Visit: Payer: Self-pay

## 2021-02-13 ENCOUNTER — Ambulatory Visit (INDEPENDENT_AMBULATORY_CARE_PROVIDER_SITE_OTHER): Payer: Medicare Other | Admitting: Cardiology

## 2021-02-13 VITALS — BP 120/80 | HR 55 | Ht 72.0 in | Wt 195.0 lb

## 2021-02-13 DIAGNOSIS — I2510 Atherosclerotic heart disease of native coronary artery without angina pectoris: Secondary | ICD-10-CM

## 2021-02-13 DIAGNOSIS — I471 Supraventricular tachycardia: Secondary | ICD-10-CM | POA: Diagnosis not present

## 2021-02-13 NOTE — Progress Notes (Signed)
Electrophysiology Office Note   Date:  02/13/2021   ID:  Terry Hansen, DOB [birth year], MRN [id number]  PCP:  Terry Peng, NP  Cardiologist: Johnsie Cancel  Primary Electrophysiologist:  Terry Furnari Meredith Leeds, MD    No chief complaint on file.    History of Present Illness: Terry Hansen is a 65 y.o. male who presents today for electrophysiology evaluation.     He has a history of coronary artery disease status post CABG in 2013, hypertension, hyperlipidemia, and atrial tachycardia.  He presented to the hospital with palpitations and was found atrial tachycardia with multiple short runs of tachycardia.  Started on metoprolol but continued to have symptoms.  He has since been started on sotalol.  Today, denies symptoms of palpitations, chest pain, shortness of breath, orthopnea, PND, lower extremity edema, claudication, dizziness, presyncope, syncope, bleeding, or neurologic sequela. The patient is tolerating medications without difficulties.  Since being seen he has done well.  He has no chest pain or shortness of breath.  Is able do all of his daily activities without restriction.  He is noted no further palpitations.  He has no acute complaints today.  Past Medical History:  Diagnosis Date   CAD (coronary artery disease)    a.  LHC 06/26/12:  pLAD 90%, prox/mid/dist CFS 20-30%, oRCA 100% (non dom), EF 55%;  b. Echo 12/13:  EF 55-60%, Gr 1 diast dysfn;  c. s/p CABG Terry Hansen) 12/13: L-LAD/Dx     GERD (gastroesophageal reflux disease)    HLD (hyperlipidemia)    Hypertension    Ocular migraine    "maybe once q couple months" (06/02/2014)   Unstable angina pectoris [facility])    Past Surgical History:  Procedure Laterality Date   APPENDECTOMY  1970's   CARDIAC CATHETERIZATION  06/2012   CORONARY ARTERY BYPASS GRAFT  06/29/2012   Procedure: CORONARY ARTERY BYPASS GRAFTING (CABG);  Surgeon: Melrose Nakayama, MD;  Location: [place];  Service: Open Heart Surgery;  Laterality: N/A;  times two  using Left Internal Mammary Artery   INGUINAL HERNIA REPAIR Bilateral [phone]   LEFT HEART CATH AND CORS/GRAFTS ANGIOGRAPHY N/A 08/16/2019   Procedure: LEFT HEART CATH AND CORS/GRAFTS ANGIOGRAPHY;  Surgeon: Terry Man, MD;  Location: [place] [facility];  Service: Cardiovascular;  Laterality: N/A;   LEFT HEART CATHETERIZATION WITH CORONARY ANGIOGRAM N/A 06/26/2012   Procedure: LEFT HEART CATHETERIZATION WITH CORONARY ANGIOGRAM;  Surgeon: Terry Hector, MD;  Location: [facility] CATH LAB;  Service: Cardiovascular;  Laterality: N/A;   LEFT HEART CATHETERIZATION WITH CORONARY/GRAFT ANGIOGRAM N/A 06/03/2014   Procedure: LEFT HEART CATHETERIZATION WITH Terry Hansen;  Surgeon: Terry Booze, MD;  Location: [facility] CATH LAB;  Service: Cardiovascular;  Laterality: N/A;   NASAL SEPTUM SURGERY     For uncontrolled epistaxis early 2013   SIGMOIDOSCOPY  10-15 years ago   in [place], [place]     Current Outpatient Medications  Medication Sig Dispense Refill   amLODipine (NORVASC) 5 MG tablet TAKE ONE TABLET BY MOUTH DAILY 90 tablet 1   aspirin EC 81 MG tablet Take 1 tablet (81 mg total) by mouth daily.     atorvastatin (LIPITOR) 40 MG tablet TAKE ONE TABLET BY MOUTH EVERY NIGHT AT BEDTIME 90 tablet 0   Cholecalciferol (D3) 50 MCG (2000 UT) TABS Take by mouth.     Coenzyme Q10 200 MG capsule Take 200 mg by mouth daily with breakfast.      diltiazem (CARDIZEM) 30 MG tablet Take 1 tablet (30 mg total)  by mouth 4 (four) times daily as needed (elevated heart rates). 30 tablet 3   fluticasone (FLONASE) 50 MCG/ACT nasal spray Place 2 sprays into both nostrils as needed for allergies or rhinitis. 15.8 mL 3   ibuprofen (ADVIL,MOTRIN) 200 MG tablet Take 400-600 mg by mouth every 6 (six) hours as needed for headache or moderate pain.      isosorbide mononitrate (IMDUR) 30 MG 24 hr tablet TAKE ONE TABLET BY MOUTH DAILY 90 tablet 3   losartan (COZAAR) 100 MG tablet TAKE ONE TABLET BY MOUTH DAILY 90 tablet 3    nitroGLYCERIN (NITROSTAT) 0.4 MG SL tablet Place 1 tablet (0.4 mg total) under the tongue every 5 (five) minutes as needed for chest pain. 25 tablet 3   omeprazole (PRILOSEC OTC) 20 MG tablet Take 20 mg by mouth daily as needed (acid reflux).      sotalol (BETAPACE) 120 MG tablet Take 1 tablet (120 mg total) by mouth every 12 (twelve) hours. 180 tablet 3   vitamin B-12 (CYANOCOBALAMIN) 1000 MCG tablet Take 1,000 mcg by mouth daily.     No current facility-administered medications for this visit.    Allergies:   Patient has no known allergies.   Social History:  The patient  reports that he has never smoked. He has never used smokeless tobacco. He reports current alcohol use. He reports that he does not use drugs.   Family History:  The patient's family history includes Colon cancer (age of onset: 7) in his maternal grandfather; Heart attack in his brother; Heart disease in his brother, cousin, and paternal grandmother; Lung cancer in his maternal grandfather.   ROS:  Please see the history of present illness.   Otherwise, review of systems is positive for none.   All other systems are reviewed and negative.   PHYSICAL EXAM: VS:  BP 120/80 (BP Location: Left Arm, Patient Position: Sitting, Cuff Size: Normal)   Pulse (!) 55   Ht 6' (1.829 m)   Wt 195 lb (88.5 kg)   SpO2 98%   BMI 26.45 kg/m  , BMI Body mass index is 26.45 kg/m. GEN: Well nourished, well developed, in no acute distress  HEENT: normal  Neck: no JVD, carotid bruits, or masses Cardiac: RRR; no murmurs, rubs, or gallops,no edema  Respiratory:  clear to auscultation bilaterally, normal work of breathing GI: soft, nontender, nondistended, + BS MS: no deformity or atrophy  Skin: warm and dry Neuro:  Strength and sensation are intact Psych: euthymic mood, full affect  EKG:  EKG is ordered today. Personal review of the ekg ordered shows sinus rhythm  Recent Labs: 09/01/2020: ALT 22; BUN 14; Creatinine, Ser 1.23;  Hemoglobin 15.0; Platelets 231.0; Potassium 5.0; Sodium 139; TSH 2.17    Lipid Panel     Component Value Date/Time   CHOL 111 09/01/2020 0824   TRIG 73.0 09/01/2020 0824   HDL 39.10 09/01/2020 0824   CHOLHDL 3 09/01/2020 0824   VLDL 14.6 09/01/2020 0824   LDLCALC 57 09/01/2020 0824     Wt Readings from Last 3 Encounters:  02/13/21 195 lb (88.5 kg)  12/12/20 197 lb 12.8 oz (89.7 kg)  09/06/20 199 lb (90.3 kg)      Other studies Reviewed: Additional studies/ records that were reviewed today include: LHC 08/16/19  Review of the above records today demonstrates:  Angiographically no change in coronary disease as noted in 2015-proximal-mid LAD 80 to 85% focal stenosis at branch point with D1 and SP1. This lesion could  explain results from stress test.  Not PCI target because it would likely jeopardize both grafts without potentially benefiting the SP branch. Widely patent LIMA-D1-LAD with retrograde filling to the original lesion Separate ostium dominant Left Circumflex with mild AV groove disease.  Small nondominant RCA.  TTE 04/09/19  1. Left ventricular ejection fraction, by visual estimation, is 60 to  65%. The left ventricle has normal function. Normal left ventricular size.  There is no left ventricular hypertrophy.   2. Left ventricular diastolic Doppler parameters are consistent with  impaired relaxation pattern of LV diastolic filling.   3. Global right ventricle has normal systolic function.The right  ventricular size is normal. No increase in right ventricular wall  thickness.   4. Left atrial size was normal.   5. Right atrial size was normal.   6. The mitral valve is normal in structure. Mild mitral valve  regurgitation. No evidence of mitral stenosis.   7. The tricuspid valve is normal in structure. Tricuspid valve  regurgitation is mild.   8. The aortic valve is severely calcified, possibly bicuspid. Aortic  valve regurgitation is mild by color flow Doppler. Mild  aortic valve  stenosis.   9. The pulmonic valve was normal in structure. Pulmonic valve  regurgitation is not visualized by color flow Doppler.  10. The inferior vena cava is normal in size with greater than 50%  respiratory variability, suggesting right atrial pressure of 3 mmHg.   ASSESSMENT AND PLAN:  1.  Atrial tachycardia: Currently on sotalol.  High risk medication monitoring.  Has been stable without palpitations and tolerating sotalol without issues.  No changes.  2.  Hypertension:Currently well controlled  3.  Coronary artery disease status post CABG: Ejection fraction normal.  No chest pain.  No changes at this time.   Current medicines are reviewed at length with the patient today.   The patient does not have concerns regarding his medicines.  The following changes were made today: None  Labs/ tests ordered today include:  Orders Placed This Encounter  Procedures   EKG 12-Lead      Disposition:   FU with Merlen Gurry 6 months  Signed, Oaklynn Stierwalt Meredith Leeds, MD  02/13/2021 11:14 AM     [facility] [address] [place] [address] [phone] (office) [phone] (fax)

## 2021-03-06 ENCOUNTER — Telehealth: Payer: Self-pay | Admitting: Pharmacist

## 2021-03-06 NOTE — Chronic Care Management (AMB) (Signed)
    Chronic Care Management Pharmacy Assistant     This encounter was created in error - please disregard.  [place] Clinical Pharmacist Assistant [phone]

## 2021-04-03 ENCOUNTER — Other Ambulatory Visit: Payer: Self-pay | Admitting: Adult Health

## 2021-04-03 ENCOUNTER — Other Ambulatory Visit: Payer: Self-pay | Admitting: Cardiovascular Disease

## 2021-06-03 IMAGING — DX DG CHEST 1V PORT
1 series · 1 of 1 positions shown · non-contrast
Comparison: November 16, 2017

CLINICAL DATA: Chest pain.

EXAM:
PORTABLE CHEST 1 VIEW

[chest ap]
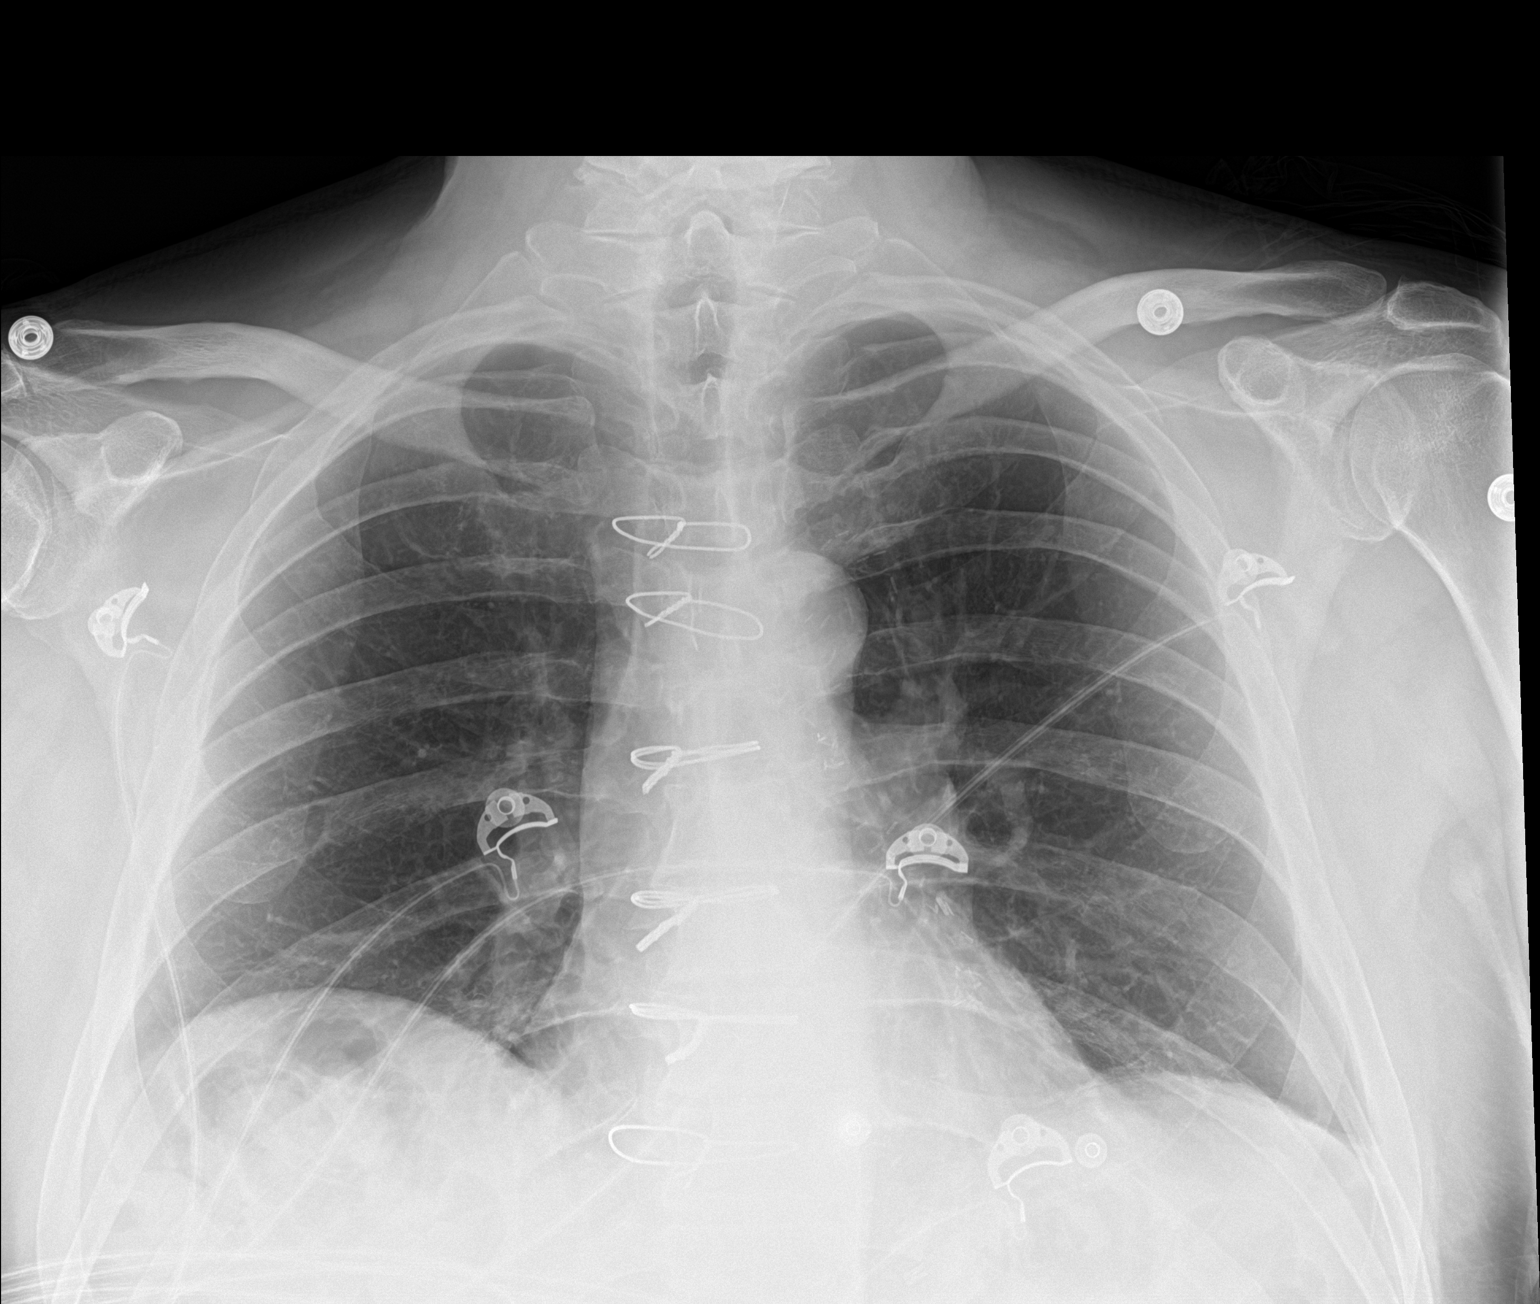

[1 of 1 positions shown; findings below may reference images not displayed]

FINDINGS: Stable postsurgical changes of CABG.

Cardiomediastinal silhouette is normal. Mediastinal contours appear
intact. Calcific atherosclerotic disease of the aorta.

There is no evidence of focal airspace consolidation, pleural
effusion or pneumothorax. Low lung volumes.

Osseous structures are without acute abnormality. Soft tissues are
grossly normal.
IMPRESSION: 1. No acute cardiopulmonary disease.
2. Low lung volumes.

## 2021-06-18 NOTE — Progress Notes (Signed)
Patient ID: Terry Hansen, male   DOB: 11-Dec-1955, 65 y.o.   MRN: [id number]             65 y.o. CABG 2013 LIMA to LAD/Diagonal patent cath 06/03/14 CRF;s HTN, HLD. Has histrory of atrial tachycardia sees [name] for On cardizem and Sotolol ( previously flecainide but changed due to CAD )  Issues with Multaq being cost prohibitive  Fatigue with beta blockers Last myovue1/19/21  apical infarct mild peri infarct ischemia on imdur. 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   Cath 08/16/19 no targets for PCI, patent LIMA to D1/LAD large left dominant circumflex and non dominant RCA without significant disease   Seen by [facility] March 2021 and stable arrhythmia on Sotolol  Not vaccinated Discussed  Retired and fairly active    No Known Allergies   Current Outpatient Medications  Medication Sig Dispense Refill   amLODipine (NORVASC) 5 MG tablet TAKE ONE TABLET BY MOUTH DAILY 90 tablet 0   aspirin EC 81 MG tablet Take 1 tablet (81 mg total) by mouth daily.     atorvastatin (LIPITOR) 40 MG tablet TAKE ONE TABLET BY MOUTH EVERY NIGHT AT BEDTIME 90 tablet 0   Cholecalciferol (D3) 50 MCG (2000 UT) TABS Take by mouth.     Coenzyme Q10 200 MG capsule Take 200 mg by mouth daily with breakfast.      diltiazem (CARDIZEM) 30 MG tablet Take 1 tablet (30 mg total) by mouth 4 (four) times daily as needed (elevated heart rates). 30 tablet 3   fluticasone (FLONASE) 50 MCG/ACT nasal spray Place 2 sprays into both nostrils as needed for allergies or rhinitis. 15.8 mL 3   ibuprofen  (ADVIL,MOTRIN) 200 MG tablet Take 400-600 mg by mouth every 6 (six) hours as needed for headache or moderate pain.      isosorbide mononitrate (IMDUR) 30 MG 24 hr tablet TAKE ONE TABLET BY MOUTH DAILY 90 tablet 3   losartan (COZAAR) 100 MG tablet TAKE ONE TABLET BY MOUTH DAILY 90 tablet 3   nitroGLYCERIN (NITROSTAT) 0.4 MG SL tablet Place 1 tablet (0.4 mg total) under the tongue every 5 (five) minutes as needed for chest pain. 25 tablet 3   omeprazole (PRILOSEC OTC) 20 MG tablet Take 20 mg by mouth daily as needed (acid reflux).      sotalol (BETAPACE) 120 MG tablet Take 1 tablet (120 mg total) by mouth every 12 (twelve) hours. 180 tablet 3   vitamin B-12 (CYANOCOBALAMIN) 1000 MCG tablet Take 1,000 mcg by mouth daily.     No current facility-administered medications for this visit.    Past Medical History:  Diagnosis Date   CAD (coronary artery disease)    a.  LHC 06/26/12:  pLAD 90%, prox/mid/dist CFS 20-30%, oRCA 100% (non dom), EF 55%;  b. Echo 12/13:  EF 55-60%, Gr 1 diast dysfn;  c. s/p CABG Roxan Hockey) 12/13: L-LAD/Dx     GERD (gastroesophageal reflux disease)    HLD (hyperlipidemia)    Hypertension    Ocular migraine    "maybe once q couple months" (  06/02/2014)   Unstable angina pectoris [facility])     Past Surgical History:  Procedure Laterality Date   APPENDECTOMY  1970's   CARDIAC CATHETERIZATION  06/2012   CORONARY ARTERY BYPASS GRAFT  06/29/2012   Procedure: CORONARY ARTERY BYPASS GRAFTING (CABG);  Surgeon: Melrose Nakayama, MD;  Location: [place];  Service: Open Heart Surgery;  Laterality: N/A;  times two using Left Internal Mammary Artery   INGUINAL HERNIA REPAIR Bilateral [phone]   LEFT HEART CATH AND CORS/GRAFTS ANGIOGRAPHY N/A 08/16/2019   Procedure: LEFT HEART CATH AND CORS/GRAFTS ANGIOGRAPHY;  Surgeon: Leonie Man, MD;  Location: [place] [facility];  Service: Cardiovascular;  Laterality: N/A;   LEFT HEART CATHETERIZATION WITH CORONARY ANGIOGRAM N/A 06/26/2012    Procedure: LEFT HEART CATHETERIZATION WITH CORONARY ANGIOGRAM;  Surgeon: Josue Hector, MD;  Location: [facility] CATH LAB;  Service: Cardiovascular;  Laterality: N/A;   LEFT HEART CATHETERIZATION WITH CORONARY/GRAFT ANGIOGRAM N/A 06/03/2014   Procedure: LEFT HEART CATHETERIZATION WITH Beatrix Fetters;  Surgeon: Jettie Booze, MD;  Location: [facility] CATH LAB;  Service: Cardiovascular;  Laterality: N/A;   NASAL SEPTUM SURGERY     For uncontrolled epistaxis early 2013   SIGMOIDOSCOPY  10-15 years ago   in [place], [place]    Family History  Problem Relation Age of Onset   Heart disease Brother        CABG age 58, redo bypass ~50   Heart disease Paternal Grandmother    Heart disease Cousin        Maternal side   Heart attack Brother    Colon cancer Maternal Grandfather 60       METS to colon from Lung CA per pt   Lung cancer Maternal Grandfather    Colon polyps Neg Hx    Esophageal cancer Neg Hx    Rectal cancer Neg Hx    Stomach cancer Neg Hx     Social History   Socioeconomic History   Marital status: Divorced    Spouse name: Not on file   Number of children: Not on file   Years of education: Not on file   Highest education level: Not on file  Occupational History   Occupation: retired  Tobacco Use   Smoking status: Never   Smokeless tobacco: Never  Vaping Use   Vaping Use: Never used  Substance and Sexual Activity   Alcohol use: Yes    Comment: occ-Social per pt   Drug use: No   Sexual activity: Not Currently  Other Topics Concern   Not on file  Social History Narrative   Divorced 7 years ago   3 children   Works in Aeronautical engineer   Social Determinants of Radio broadcast assistant Strain: Low Risk    Difficulty of Paying Living Expenses: Not hard at all  Food Insecurity: No Food Insecurity   Worried About Charity fundraiser in the Last Year: Never true   Arboriculturist in the Last Year: Never true  Transportation Needs: No Transportation Needs    Lack of Transportation (Medical): No   Lack of Transportation (Non-Medical): No  Physical Activity: Insufficiently Active   Days of Exercise per Week: 2 days   Minutes of Exercise per Session: 60 min  Stress: No Stress Concern Present   Feeling of Stress : Not at all  Social Connections: Moderately Isolated   Frequency of Communication with Friends and Family: More than three times a week   Frequency of  Social Gatherings with Friends and Family: More than three times a week   Attends Religious Services: 1 to 4 times per year   Active Member of [employer] or Organizations: No   Attends Music therapist: Never   Marital Status: Divorced  Human resources officer Violence: Not At Risk   Fear of Current or Ex-Partner: No   Emotionally Abused: No   Physically Abused: No   Sexually Abused: No    ROS:  insomnia and anxiety mild GERD all other symptoms reviewed and negative   Vitals:   06/22/21 1508  BP: 132/90  Pulse: 69  SpO2: 98%     Affect appropriate Healthy:  appears stated age 73: normal Neck supple with no adenopathy JVP normal no bruits no thyromegaly Lungs clear with no wheezing and good diaphragmatic motion Heart:  S1/S2 no murmur, no rub, gallop or click PMI normal post sternotomy  Abdomen: benighn, BS positve, no tenderness, no AAA no bruit.  No HSM or HJR Distal pulses intact with no bruits No edema Neuro non-focal Skin warm and dry No muscular weakness   Wt Readings from Last 3 Encounters:  06/22/21 198 lb (89.8 kg)  06/22/21 199 lb 3.2 oz (90.4 kg)  02/13/21 195 lb (88.5 kg)    Lab Results  Component Value Date   WBC 7.2 09/01/2020   HGB 15.0 09/01/2020   HCT 43.2 09/01/2020   PLT 231.0 09/01/2020   GLUCOSE 98 09/01/2020   CHOL 111 09/01/2020   TRIG 73.0 09/01/2020   HDL 39.10 09/01/2020   LDLCALC 57 09/01/2020   ALT 22 09/01/2020   AST 16 09/01/2020   NA 139 09/01/2020   K 5.0 09/01/2020   CL 102 09/01/2020   CREATININE 1.23  09/01/2020   BUN 14 09/01/2020   CO2 31 09/01/2020   TSH 2.17 09/01/2020   PSA 0.88 09/01/2020   INR 1.05 10/19/2015   HGBA1C 5.4 04/08/2019    EKG: Normal sinus rhythm, no acute change  09/29/14     Impression:  CAD/CABG: LIMA LAD/D1 patent cath 08/16/19 with no significant disease in left dominant circumflex and  Small non dominant RCA Continue medical Rx   ATrial Tachycardia: Sees [name] stable started on sotolol in hospital end of December 2020 and improved   HTN:  Well controlled.  Continue current medications and low sodium Dash type diet.     Cholesterol: at goal labs with primary   Lab Results  Component Value Date   CHOL 111 09/01/2020   HDL 39.10 09/01/2020   LDLCALC 57 09/01/2020   TRIG 73.0 09/01/2020   CHOLHDL 3 09/01/2020   F/U in 6 months with [name] and me in a year  Jenkins Rouge

## 2021-06-22 ENCOUNTER — Encounter: Payer: Self-pay | Admitting: Cardiovascular Disease

## 2021-06-22 ENCOUNTER — Encounter: Payer: Self-pay | Admitting: Adult Health

## 2021-06-22 ENCOUNTER — Ambulatory Visit (INDEPENDENT_AMBULATORY_CARE_PROVIDER_SITE_OTHER): Payer: Medicare Other | Admitting: Cardiovascular Disease

## 2021-06-22 ENCOUNTER — Other Ambulatory Visit: Payer: Self-pay

## 2021-06-22 ENCOUNTER — Ambulatory Visit (INDEPENDENT_AMBULATORY_CARE_PROVIDER_SITE_OTHER): Payer: Medicare Other | Admitting: Adult Health

## 2021-06-22 VITALS — BP 132/90 | HR 69 | Ht 71.0 in | Wt 198.0 lb

## 2021-06-22 VITALS — BP 134/84 | HR 70 | Temp 98.2°F | Ht 72.0 in | Wt 199.2 lb

## 2021-06-22 DIAGNOSIS — D492 Neoplasm of unspecified behavior of bone, soft tissue, and skin: Secondary | ICD-10-CM

## 2021-06-22 DIAGNOSIS — I2510 Atherosclerotic heart disease of native coronary artery without angina pectoris: Secondary | ICD-10-CM

## 2021-06-22 DIAGNOSIS — I10 Essential (primary) hypertension: Secondary | ICD-10-CM

## 2021-06-22 DIAGNOSIS — I471 Supraventricular tachycardia: Secondary | ICD-10-CM | POA: Diagnosis not present

## 2021-06-22 NOTE — Patient Instructions (Signed)
Medication Instructions:  Your physician recommends that you continue on your current medications as directed. Please refer to the Current Medication list given to you today.  *If you need a refill on your cardiac medications before your next appointment, please call your pharmacy*   Lab Work: NONE If you have labs (blood work) drawn today and your tests are completely normal, you will receive your results only by: [place] (if you have MyChart) OR A paper copy in the mail If you have any lab test that is abnormal or we need to change your treatment, we will call you to review the results.   Testing/Procedures: NONE   Follow-Up: At [facility], you and your health needs are our priority.  As part of our continuing mission to provide you with exceptional heart care, we have created designated Provider Care Teams.  These Care Teams include your primary Cardiologist (physician) and Advanced Practice Providers (APPs -  Physician Assistants and Nurse Practitioners) who all work together to provide you with the care you need, when you need it.  We recommend signing up for the patient portal called "MyChart".  Sign up information is provided on this After Visit Summary.  MyChart is used to connect with patients for Virtual Visits (Telemedicine).  Patients are able to view lab/test results, encounter notes, upcoming appointments, etc.  Non-urgent messages can be sent to your provider as well.   To learn more about what you can do with MyChart, go to [email or web address].    Your next appointment:   1 year(s)  The format for your next appointment:   In Person  Provider:   Jenkins Rouge, MD {

## 2021-06-22 NOTE — Progress Notes (Signed)
Subjective:    Patient ID: Terry Hansen, male    DOB: 06-07-1956, 65 y.o.   MRN: [id number]  HPI  65 year old male who  has a past medical history of CAD (coronary artery disease), GERD (gastroesophageal reflux disease), HLD (hyperlipidemia), Hypertension, Ocular migraine, and Unstable angina pectoris ([place]).  He presents to the office today for an acute issue.  He has a skin neoplasm on his right dorsal hand.  In February 2022 we used liquid nitrogen to freeze this neoplasm.  He does report that it went away but eventually came back and feels as though has been growing in size.  Denies pain, itching, or bleeding from this neoplasm.  Review of Systems See HPI   Past Medical History:  Diagnosis Date   CAD (coronary artery disease)    a.  LHC 06/26/12:  pLAD 90%, prox/mid/dist CFS 20-30%, oRCA 100% (non dom), EF 55%;  b. Echo 12/13:  EF 55-60%, Gr 1 diast dysfn;  c. s/p CABG Roxan Hockey) 12/13: L-LAD/Dx     GERD (gastroesophageal reflux disease)    HLD (hyperlipidemia)    Hypertension    Ocular migraine    "maybe once q couple months" (06/02/2014)   Unstable angina pectoris ([facility])     Social History   Socioeconomic History   Marital status: Divorced    Spouse name: Not on file   Number of children: Not on file   Years of education: Not on file   Highest education level: Not on file  Occupational History   Occupation: retired  Tobacco Use   Smoking status: Never   Smokeless tobacco: Never  Vaping Use   Vaping Use: Never used  Substance and Sexual Activity   Alcohol use: Yes    Comment: occ-Social per pt   Drug use: No   Sexual activity: Not Currently  Other Topics Concern   Not on file  Social History Narrative   Divorced 7 years ago   3 children   Works in Aeronautical engineer   Social Determinants of Radio broadcast assistant Strain: Low Risk    Difficulty of Paying Living Expenses: Not hard at all  Food Insecurity: No Food Insecurity   Worried About  Charity fundraiser in the Last Year: Never true   Arboriculturist in the Last Year: Never true  Transportation Needs: No Transportation Needs   Lack of Transportation (Medical): No   Lack of Transportation (Non-Medical): No  Physical Activity: Insufficiently Active   Days of Exercise per Week: 2 days   Minutes of Exercise per Session: 60 min  Stress: No Stress Concern Present   Feeling of Stress : Not at all  Social Connections: Moderately Isolated   Frequency of Communication with Friends and Family: More than three times a week   Frequency of Social Gatherings with Friends and Family: More than three times a week   Attends Religious Services: 1 to 4 times per year   Active Member of [employer] or Organizations: No   Attends Archivist Meetings: Never   Marital Status: Divorced  Human resources officer Violence: Not At Risk   Fear of Current or Ex-Partner: No   Emotionally Abused: No   Physically Abused: No   Sexually Abused: No    Past Surgical History:  Procedure Laterality Date   APPENDECTOMY  1970's   CARDIAC CATHETERIZATION  06/2012   CORONARY ARTERY BYPASS GRAFT  06/29/2012   Procedure: CORONARY ARTERY BYPASS GRAFTING (CABG);  Surgeon:  Melrose Nakayama, MD;  Location: [place];  Service: Open Heart Surgery;  Laterality: N/A;  times two using Left Internal Mammary Artery   INGUINAL HERNIA REPAIR Bilateral [phone]   LEFT HEART CATH AND CORS/GRAFTS ANGIOGRAPHY N/A 08/16/2019   Procedure: LEFT HEART CATH AND CORS/GRAFTS ANGIOGRAPHY;  Surgeon: Leonie Man, MD;  Location: [place] [facility];  Service: Cardiovascular;  Laterality: N/A;   LEFT HEART CATHETERIZATION WITH CORONARY ANGIOGRAM N/A 06/26/2012   Procedure: LEFT HEART CATHETERIZATION WITH CORONARY ANGIOGRAM;  Surgeon: Josue Hector, MD;  [facility] CATH LAB;  Service: Cardiovascular;  Laterality: N/A;   LEFT HEART CATHETERIZATION WITH CORONARY/GRAFT ANGIOGRAM N/A 06/03/2014   Procedure: LEFT HEART CATHETERIZATION  WITH Beatrix Fetters;  Surgeon: Jettie Booze, MD;  Location: [facility] C[facility] LAB;  Service: Cardiovascular;  Laterality: N/A;   NASAL SEPTUM SURGERY     For uncontrolled epistaxis early 2013   SIGMOIDOSCOPY  10-15 years ago   in [place], [place]    Family History  Problem Relation Age of Onset   Heart disease Brother        CABG age 48, redo bypass ~50   Heart disease Paternal Grandmother    Heart disease Cousin        Maternal side   Heart attack Brother    Colon cancer Maternal Grandfather 60       METS to colon from Lung CA per pt   Lung cancer Maternal Grandfather    Colon polyps Neg Hx    Esophageal cancer Neg Hx    Rectal cancer Neg Hx    Stomach cancer Neg Hx     No Known Allergies  Current Outpatient Medications on File Prior to Visit  Medication Sig Dispense Refill   amLODipine (NORVASC) 5 MG tablet TAKE ONE TABLET BY MOUTH DAILY 90 tablet 0   aspirin EC 81 MG tablet Take 1 tablet (81 mg total) by mouth daily.     atorvastatin (LIPITOR) 40 MG tablet TAKE ONE TABLET BY MOUTH EVERY NIGHT AT BEDTIME 90 tablet 0   Cholecalciferol (D3) 50 MCG (2000 UT) TABS Take by mouth.     Coenzyme Q10 200 MG capsule Take 200 mg by mouth daily with breakfast.      diltiazem (CARDIZEM) 30 MG tablet Take 1 tablet (30 mg total) by mouth 4 (four) times daily as needed (elevated heart rates). 30 tablet 3   fluticasone (FLONASE) 50 MCG/ACT nasal spray Place 2 sprays into both nostrils as needed for allergies or rhinitis. 15.8 mL 3   ibuprofen (ADVIL,MOTRIN) 200 MG tablet Take 400-600 mg by mouth every 6 (six) hours as needed for headache or moderate pain.      isosorbide mononitrate (IMDUR) 30 MG 24 hr tablet TAKE ONE TABLET BY MOUTH DAILY 90 tablet 3   losartan (COZAAR) 100 MG tablet TAKE ONE TABLET BY MOUTH DAILY 90 tablet 3   nitroGLYCERIN (NITROSTAT) 0.4 MG SL tablet Place 1 tablet (0.4 mg total) under the tongue every 5 (five) minutes as needed for chest pain. 25 tablet 3    omeprazole (PRILOSEC OTC) 20 MG tablet Take 20 mg by mouth daily as needed (acid reflux).      sotalol (BETAPACE) 120 MG tablet Take 1 tablet (120 mg total) by mouth every 12 (twelve) hours. 180 tablet 3   vitamin B-12 (CYANOCOBALAMIN) 1000 MCG tablet Take 1,000 mcg by mouth daily.     No current facility-administered medications on file prior to visit.    BP  134/84 (BP Location: Left Arm, Patient Position: Sitting, Cuff Size: Normal)   Pulse 70   Temp 98.2 F (36.8 C) (Oral)   Ht 6' (1.829 m)   Wt 199 lb 3.2 oz (90.4 kg)   SpO2 98%   BMI 27.02 kg/m       Objective:   Physical Exam Constitutional:      Appearance: Normal appearance.  Skin:    General: Skin is warm and dry.     Capillary Refill: Capillary refill takes less than 2 seconds.     Findings: Lesion (Centimeter raised flesh-colored neoplasm on right dorsal hand) present.  Neurological:     General: No focal deficit present.     Mental Status: He is alert and oriented to person, place, and time.  Psychiatric:        Mood and Affect: Mood normal.        Behavior: Behavior normal.        Thought Content: Thought content normal.      Assessment & Plan:  1. Skin neoplasm Procedure including risks/benefits explained to patient.  Questions were answered. After informed consent was obtained and a time out completed, site was cleansed with betadine and then alcohol. 1% Lidocaine with epinephrine was injected under lesion and then shave biopsy was performed. Area was cauterized to obtain hemostasis.  Pt tolerated procedure well.  Specimen sent for pathology review.  Pt instructed to keep the area dry for 24 hours and to contact us if he develops redness, drainage or swelling at the site.  Pt may use tylenol as needed for discomfort today.   - Dermatology pathology  [name], NP

## 2021-06-28 ENCOUNTER — Other Ambulatory Visit: Payer: Self-pay | Admitting: Cardiovascular Disease

## 2021-07-12 ENCOUNTER — Other Ambulatory Visit: Payer: Self-pay | Admitting: Cardiovascular Disease

## 2021-07-18 ENCOUNTER — Telehealth (INDEPENDENT_AMBULATORY_CARE_PROVIDER_SITE_OTHER): Payer: Medicare Other | Admitting: Adult Health

## 2021-07-18 ENCOUNTER — Encounter: Payer: Self-pay | Admitting: Adult Health

## 2021-07-18 VITALS — Ht 72.0 in | Wt 196.0 lb

## 2021-07-18 DIAGNOSIS — J069 Acute upper respiratory infection, unspecified: Secondary | ICD-10-CM | POA: Diagnosis not present

## 2021-07-18 MED ORDER — FLUTICASONE PROPIONATE 50 MCG/ACT NA SUSP: 2.0000 | Freq: Every day | NASAL | 6 refills | Status: DC

## 2021-07-18 NOTE — Progress Notes (Signed)
Virtual Visit via Telephone Note  I connected with Terry Hansen on 07/18/21 at 10:30 AM EST by telephone and verified that I am speaking with the correct person using two identifiers.   I discussed the limitations, risks, security and privacy concerns of performing an evaluation and management service by telephone and the availability of in person appointments. I also discussed with the patient that there may be a patient responsible charge related to this service. The patient expressed understanding and agreed to proceed.  Location patient: home Location provider: work or home office Participants present for the call: patient, provider Patient did not have a visit in the prior 7 days to address this/these issue(s).   History of Present Illness: 65 year old male who  has a past medical history of CAD (coronary artery disease), GERD (gastroesophageal reflux disease), HLD (hyperlipidemia), Hypertension, Ocular migraine, and Unstable angina pectoris ([place]).  Is being evaluated today for an acute issue. About 2-3 weeks ago he was diagnosed with influenza. Hs flu symptoms have resolved but he continues to have nasal congestion that is worse at night and improves throughout the day. Denies fevers or chills. No longer having pain and pressure in his sinus cavities.   Has been using Afrin and Saline solution intermittently.    Observations/Objective: Patient sounds cheerful and well on the phone. I do not appreciate any SOB. Speech and thought processing are grossly intact. Patient reported vitals:  Assessment and Plan: 1. Viral upper respiratory tract infection - He is improving. Will have him d/c Afrin.  - Can use Flonase and add Sudafed for the next 2-3 days  - Follow up as needed  - fluticasone (FLONASE) 50 MCG/ACT nasal spray; Place 2 sprays into both nostrils daily.  Dispense: 16 g; Refill: 6     Follow Up Instructions:  [id number] 5-10 [id number] 11-20 9443 21-30 I did not refer this  patient for an OV in the next 24 hours for this/these issue(s).  I discussed the assessment and treatment plan with the patient. The patient was provided an opportunity to ask questions and all were answered. The patient agreed with the plan and demonstrated an understanding of the instructions.   The patient was advised to call back or seek an in-person evaluation if the symptoms worsen or if the condition fails to improve as anticipated.  I provided 22 minutes of non-face-to-face time during this encounter.   Terry Peng, NP

## 2021-07-26 ENCOUNTER — Other Ambulatory Visit: Payer: Self-pay | Admitting: Adult Health

## 2021-08-09 ENCOUNTER — Other Ambulatory Visit: Payer: Self-pay | Admitting: Cardiovascular Disease

## 2021-09-04 IMAGING — CR DG CHEST 2V
2 series · 2 of 2 positions shown · non-contrast
Comparison: 04/08/2019

CLINICAL DATA: Chest pain

EXAM:
CHEST - 2 VIEW

[chest pa]
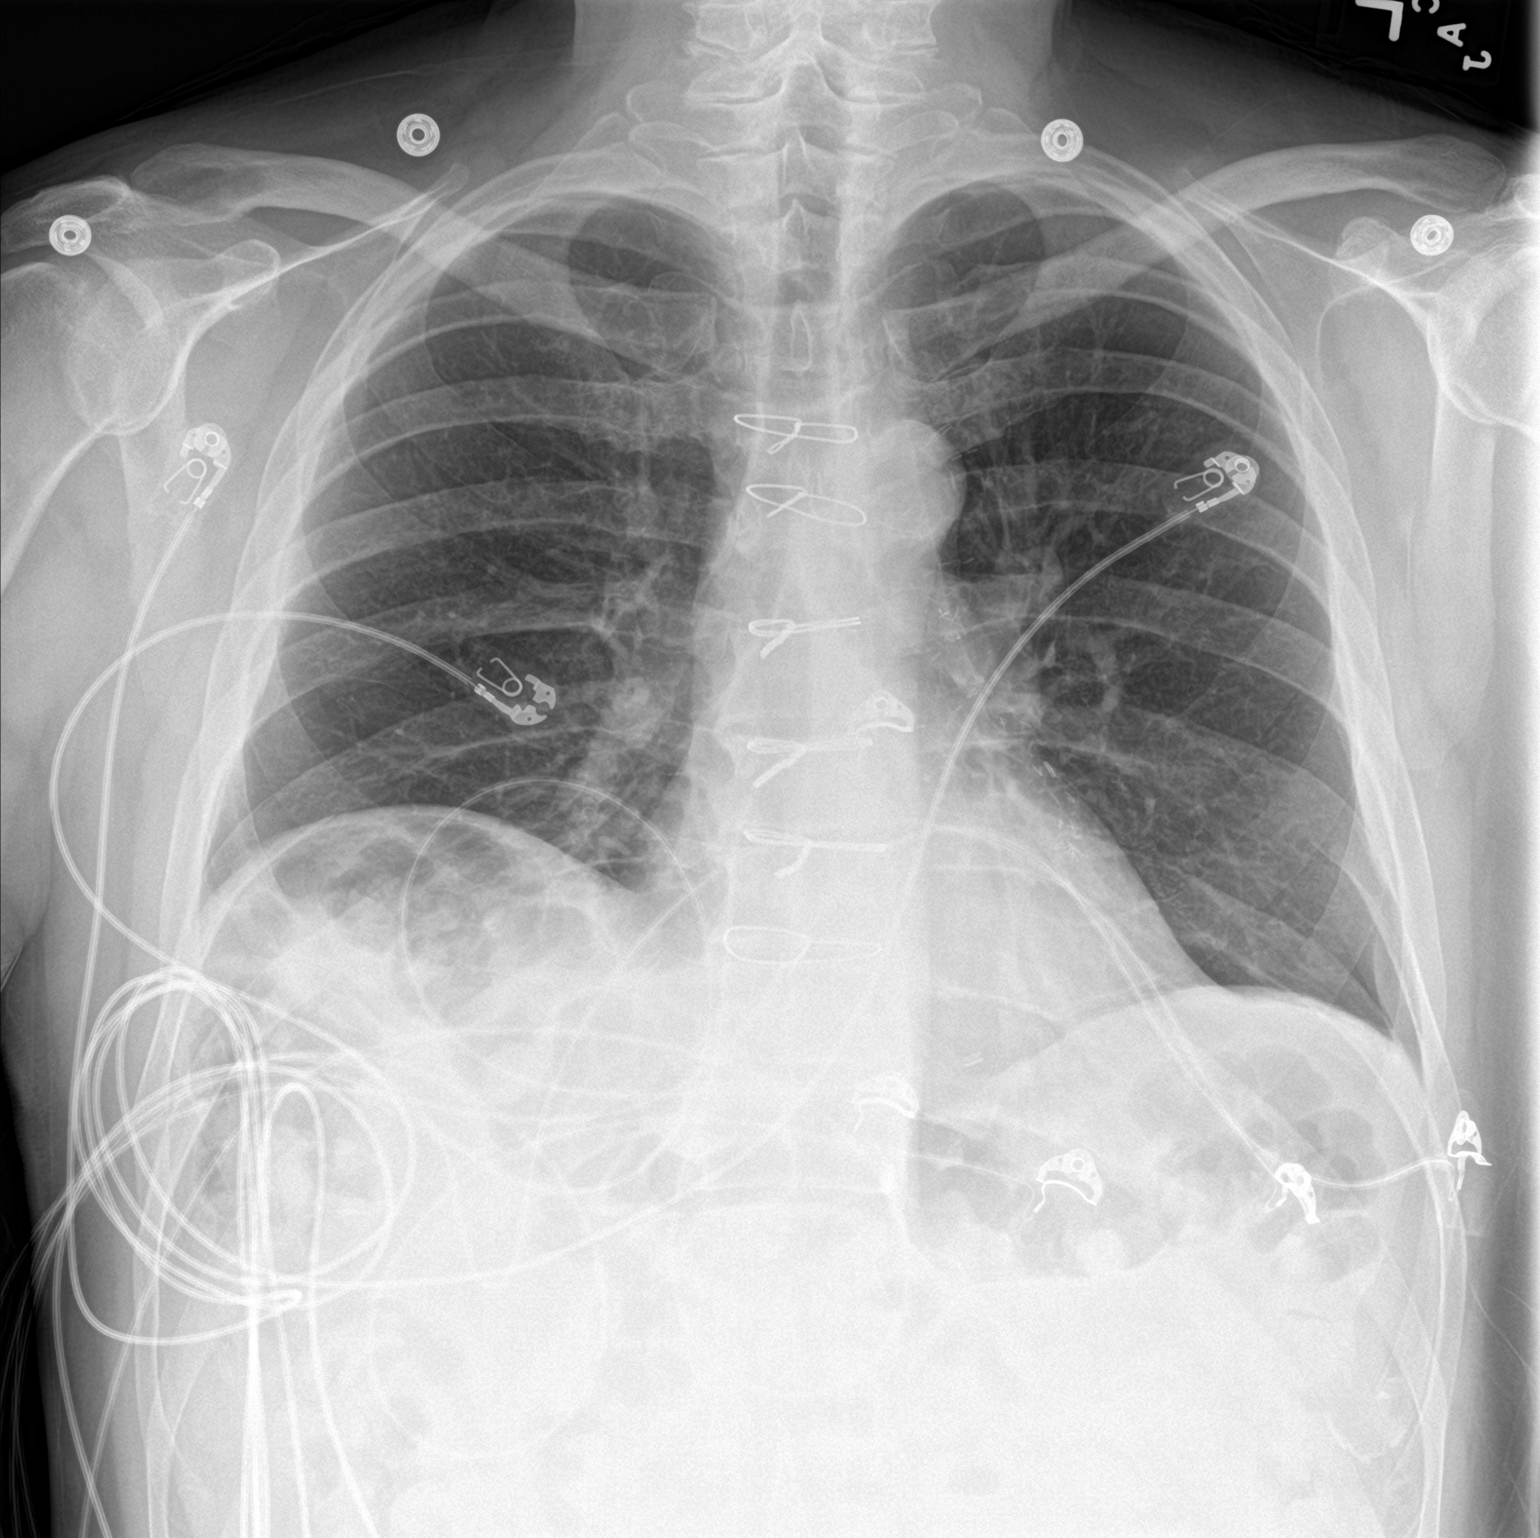

[chest lat]
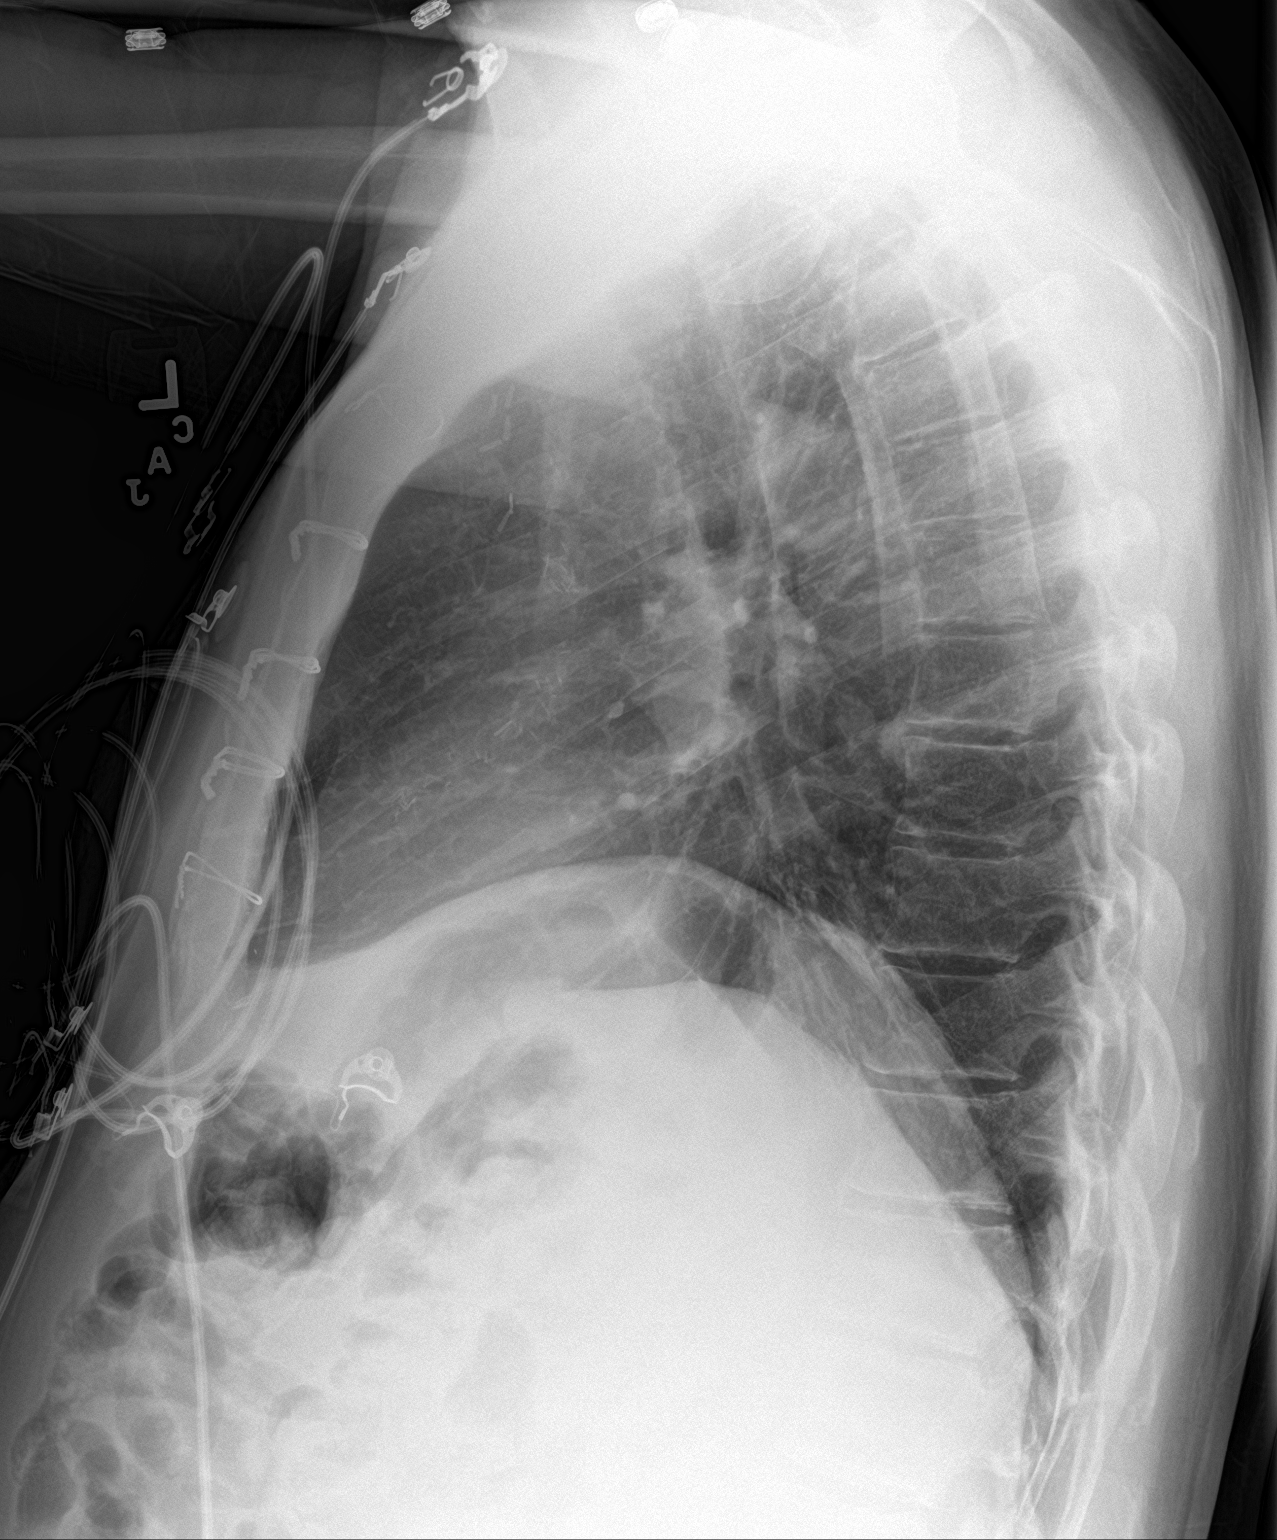

[2 of 2 positions shown; findings below may reference images not displayed]

FINDINGS: There is elevation of the right hemidiaphragm. The heart size is
stable. The patient is status post prior CABG. There is no
pneumothorax. No large pleural effusion. There may be a minimal
amount of atelectasis at the left lung base.
IMPRESSION: No active cardiopulmonary disease.

## 2021-09-24 ENCOUNTER — Other Ambulatory Visit: Payer: Self-pay | Admitting: Cardiovascular Disease

## 2021-10-03 ENCOUNTER — Other Ambulatory Visit: Payer: Self-pay

## 2021-10-03 MED ORDER — SOTALOL HCL 120 MG PO TABS: 120.0000 mg | ORAL_TABLET | Freq: Two times a day (BID) | ORAL | 0 refills | Status: DC

## 2021-10-15 ENCOUNTER — Other Ambulatory Visit: Payer: Self-pay | Admitting: Adult Health

## 2021-11-07 NOTE — Progress Notes (Signed)
? ?Cardiology Office Note ?Date:  11/07/2021  ?Patient ID:  Terry Hansen, DOB 1956-05-05, MRN [id number] ?PCP:  [name], NP  ?Cardiologist:  Dr. Johnsie Cancel ?Electrophysiologist: Dr. Curt Bears ? ?  ?Chief Complaint: 6 mo ? ?History of Present Illness: ?Terry Hansen is a 66 y.o. male with history of CAD (CABG 2013), HTN, HLD, ATach, RBBB ? ?He comes in today to be seen for Dr. Curt Bears, last seen by him July 2022, doing well on Sotalol, no changes were made. ? ?He saw Dr. Johnsie Cancel Dec 2022, doing well, no ischemic symptoms, no changes were made. ? ?TODAY ?He is doing very well ?NO CP, palpitations or cardiac awareness. ?NO SOB ?No exertional capacities, though admits as he gets older has more aches/pains, not as energetic as he used to be ?No dizziness, near syncope or syncope. ?+ ankle edema after sitting a while or on his feet more. ? ? ? ?AAD Hx ?Dec 2020 started on sotalol for ATach ?Flecainide remotely ? ?Past Medical History:  ?Diagnosis Date  ? CAD (coronary artery disease)   ? a.  LHC 06/26/12:  pLAD 90%, prox/mid/dist CFS 20-30%, oRCA 100% (non dom), EF 55%;  b. Echo 12/13:  EF 55-60%, Gr 1 diast dysfn;  c. s/p CABG Roxan Hockey) 12/13: L-LAD/Dx    ? GERD (gastroesophageal reflux disease)   ? HLD (hyperlipidemia)   ? Hypertension   ? Ocular migraine   ? "maybe once q couple months" (06/02/2014)  ? Unstable angina pectoris ([place])   ? ? ?Past Surgical History:  ?Procedure Laterality Date  ? APPENDECTOMY  1970's  ? CARDIAC CATHETERIZATION  06/2012  ? CORONARY ARTERY BYPASS GRAFT  06/29/2012  ? Procedure: CORONARY ARTERY BYPASS GRAFTING (CABG);  Surgeon: Melrose Nakayama, MD;  Location: [place];  Service: Open Heart Surgery;  Laterality: N/A;  times two using Left Internal Mammary Artery  ? INGUINAL HERNIA REPAIR Bilateral [phone]  ? LEFT HEART CATH AND CORS/GRAFTS ANGIOGRAPHY N/A 08/16/2019  ? Procedure: LEFT HEART CATH AND CORS/GRAFTS ANGIOGRAPHY;  Surgeon: Leonie Man, MD;  Location: [place] [facility];   Service: Cardiovascular;  Laterality: N/A;  ? LEFT HEART CATHETERIZATION WITH CORONARY ANGIOGRAM N/A 06/26/2012  ? Procedure: LEFT HEART CATHETERIZATION WITH CORONARY ANGIOGRAM;  Surgeon: Josue Hector, MD;  Location: [facility] CATH LAB;  Service: Cardiovascular;  Laterality: N/A;  ? LEFT HEART CATHETERIZATION WITH CORONARY/GRAFT ANGIOGRAM N/A 06/03/2014  ? Procedure: LEFT HEART CATHETERIZATION WITH Beatrix Fetters;  Surgeon: Jettie Booze, MD;  Location: [facility] CATH LAB;  Service: Cardiovascular;  Laterality: N/A;  ? NASAL SEPTUM SURGERY    ? For uncontrolled epistaxis early 2013  ? SIGMOIDOSCOPY  10-15 years ago  ? in [place], [place]  ? ? ?Current Outpatient Medications  ?Medication Sig Dispense Refill  ? atorvastatin (LIPITOR) 40 MG tablet TAKE ONE TABLET BY MOUTH EVERY NIGHT AT BEDTIME 90 tablet 3  ? amLODipine (NORVASC) 5 MG tablet TAKE ONE TABLET BY MOUTH DAILY 90 tablet 2  ? aspirin EC 81 MG tablet Take 1 tablet (81 mg total) by mouth daily.    ? Cholecalciferol (D3) 50 MCG (2000 UT) TABS Take by mouth.    ? Coenzyme Q10 200 MG capsule Take 200 mg by mouth daily with breakfast.     ? diltiazem (CARDIZEM) 30 MG tablet Take 1 tablet (30 mg total) by mouth 4 (four) times daily as needed (elevated heart rates). 30 tablet 3  ? fluticasone (FLONASE) 50 MCG/ACT nasal spray Place 2 sprays into both nostrils daily.  16 g 6  ? ibuprofen (ADVIL,MOTRIN) 200 MG tablet Take 400-600 mg by mouth every 6 (six) hours as needed for headache or moderate pain.     ? isosorbide mononitrate (IMDUR) 30 MG 24 hr tablet TAKE ONE TABLET BY MOUTH DAILY 90 tablet 3  ? losartan (COZAAR) 100 MG tablet TAKE ONE TABLET BY MOUTH DAILY 90 tablet 3  ? nitroGLYCERIN (NITROSTAT) 0.4 MG SL tablet Place 1 tablet (0.4 mg total) under the tongue every 5 (five) minutes as needed for chest pain. 25 tablet 3  ? omeprazole (PRILOSEC OTC) 20 MG tablet Take 20 mg by mouth daily as needed (acid reflux).     ? sotalol (BETAPACE) 120 MG tablet Take 1 tablet  (120 mg total) by mouth every 12 (twelve) hours. Pt needs to keep upcoming appt in April for further refills 180 tablet 0  ? vitamin B-12 (CYANOCOBALAMIN) 1000 MCG tablet Take 1,000 mcg by mouth daily.    ? ?No current facility-administered medications for this visit.  ? ? ?Allergies:   Patient has no known allergies.  ? ?Social History:  The patient  reports that he has never smoked. He has never used smokeless tobacco. He reports current alcohol use. He reports that he does not use drugs.  ? ?Family History:  The patient's family history includes Colon cancer (age of onset: 72) in his maternal grandfather; Heart attack in his brother; Heart disease in his brother, cousin, and paternal grandmother; Lung cancer in his maternal grandfather. ? ?ROS:  Please see the history of present illness.    ?All other systems are reviewed and otherwise negative.  ? ?PHYSICAL EXAM:  ?VS:  There were no vitals taken for this visit. BMI: There is no height or weight on file to calculate BMI. ?Well nourished, well developed, in no acute distress ?HEENT: normocephalic, atraumatic ?Neck: no JVD, carotid bruits or masses ?Cardiac:  RRR; no significant murmurs, no rubs, or gallops ?Lungs:  CTA b/l, no wheezing, rhonchi or rales ?Abd: soft, nontender ?MS: no deformity or atrophy ?Ext: trace-1+ ankle edema b/l ?Skin: warm and dry, no rash ?Neuro:  No gross deficits appreciated ?Psych: euthymic mood, full affect ? ? ? ?EKG:  Done today and reviewed by myself shows  ?SR 64bpm, RBBB, similar to prior ? ?LHC 08/16/19   ?Angiographically no change in coronary disease as noted in 2015-proximal-mid LAD 80 to 85% focal stenosis at branch point with D1 and SP1. ?This lesion could explain results from stress test.  Not PCI target because it would likely jeopardize both grafts without potentially benefiting the SP branch. ?Widely patent LIMA-D1-LAD with retrograde filling to the original lesion ?Separate ostium dominant Left Circumflex with mild AV  groove disease.  Small nondominant RCA. ?  ?TTE 04/09/19 ? 1. Left ventricular ejection fraction, by visual estimation, is 60 to  ?65%. The left ventricle has normal function. Normal left ventricular size.  ?There is no left ventricular hypertrophy.  ? 2. Left ventricular diastolic Doppler parameters are consistent with  ?impaired relaxation pattern of LV diastolic filling.  ? 3. Global right ventricle has normal systolic function.The right  ?ventricular size is normal. No increase in right ventricular wall  ?thickness.  ? 4. Left atrial size was normal.  ? 5. Right atrial size was normal.  ? 6. The mitral valve is normal in structure. Mild mitral valve  ?regurgitation. No evidence of mitral stenosis.  ? 7. The tricuspid valve is normal in structure. Tricuspid valve  ?regurgitation is mild.  ?  8. The aortic valve is severely calcified, possibly bicuspid. Aortic  ?valve regurgitation is mild by color flow Doppler. Mild aortic valve  ?stenosis.  ? 9. The pulmonic valve was normal in structure. Pulmonic valve  ?regurgitation is not visualized by color flow Doppler.  ?10. The inferior vena cava is normal in size with greater than 50%  ?respiratory variability, suggesting right atrial pressure of 3 mmHg.  ? ?Recent Labs: ?No results found for requested labs within last 8760 hours.  ?No results found for requested labs within last 8760 hours.  ? ?CrCl cannot be calculated (Patient's most recent lab result is older than the maximum 21 days allowed.).  ? ?Wt Readings from Last 3 Encounters:  ?07/18/21 196 lb (88.9 kg)  ?06/22/21 198 lb (89.8 kg)  ?06/22/21 199 lb 3.2 oz (90.4 kg)  ?  ? ?Other studies reviewed: ?Additional studies/records reviewed today include: summarized above ? ?ASSESSMENT AND PLAN: ? ?ATach ?No symptoms of tachycardia ?QTc stable on Sotalol ? ?CAD ?No anginal sounding symptoms ?On ASA, statin, nitrate, sotalol ?C/w Dr. Johnsie Cancel ? ?HTN ?Looks good ?Ankle edema, he mentions Dr. Johnsie Cancel mentioned could make  some med changes if it became bothersome ?Asked if he could stop amlodipine without adding on to the losartan ?We discussed stopping the amlodipine and if his BP increased would/could add HCTZ to his losa

## 2021-11-08 ENCOUNTER — Telehealth: Payer: Self-pay | Admitting: *Deleted

## 2021-11-08 ENCOUNTER — Ambulatory Visit (INDEPENDENT_AMBULATORY_CARE_PROVIDER_SITE_OTHER): Payer: Medicare Other | Admitting: Physician Assistant

## 2021-11-08 ENCOUNTER — Encounter: Payer: Self-pay | Admitting: Physician Assistant

## 2021-11-08 VITALS — BP 122/68 | HR 64 | Ht 72.0 in | Wt 198.2 lb

## 2021-11-08 DIAGNOSIS — Z5181 Encounter for therapeutic drug level monitoring: Secondary | ICD-10-CM | POA: Diagnosis not present

## 2021-11-08 DIAGNOSIS — I10 Essential (primary) hypertension: Secondary | ICD-10-CM | POA: Diagnosis not present

## 2021-11-08 DIAGNOSIS — Z79899 Other long term (current) drug therapy: Secondary | ICD-10-CM

## 2021-11-08 DIAGNOSIS — I2510 Atherosclerotic heart disease of native coronary artery without angina pectoris: Secondary | ICD-10-CM

## 2021-11-08 DIAGNOSIS — I471 Supraventricular tachycardia: Secondary | ICD-10-CM | POA: Diagnosis not present

## 2021-11-08 MED ORDER — AMLODIPINE BESYLATE 2.5 MG PO TABS: 2.5000 mg | ORAL_TABLET | Freq: Every day | ORAL | 3 refills | Status: DC

## 2021-11-08 NOTE — Patient Instructions (Signed)
Medication Instructions:  ? ? ?Your physician recommends that you continue on your current medications as directed. Please refer to the Current Medication list given to you today. ? ?*If you need a refill on your cardiac medications before your next appointment, please call your pharmacy* ? ? ?Lab Work:  [facility] AND [place]  ? ?If you have labs (blood work) drawn today and your tests are completely normal, you will receive your results only by: ?MyChart Message (if you have MyChart) OR ?A paper copy in the mail ?If you have any lab test that is abnormal or we need to change your treatment, we will call you to review the results. ? ? ?Testing/Procedures: NONE ORDERED  TODAY ? ? ?Follow-Up: ?At [facility], you and your health needs are our priority.  As part of our continuing mission to provide you with exceptional heart care, we have created designated Provider Care Teams.  These Care Teams include your primary Cardiologist (physician) and Advanced Practice Providers (APPs -  Physician Assistants and Nurse Practitioners) who all work together to provide you with the care you need, when you need it. ? ?We recommend signing up for the patient portal called "MyChart".  Sign up information is provided on this After Visit Summary.  MyChart is used to connect with patients for Virtual Visits (Telemedicine).  Patients are able to view lab/test results, encounter notes, upcoming appointments, etc.  Non-urgent messages can be sent to your provider as well.   ?To learn more about what you can do with MyChart, go to [email or web address].   ? ?Your next appointment:   ?6 month(s) ? ?The format for your next appointment:   ?In Person ? ?Provider:   ?You may see Will Meredith Leeds, MD or one of the following Advanced Practice Providers on your designated Care Team:   ?Tommye Standard, PA-C ? ? ? ?Other Instructions ? ? ?Important Information About Sugar ? ? ? ? ?  ?

## 2021-11-08 NOTE — Telephone Encounter (Signed)
Spoke with patient of medication  Amlodipine 5 mg to 2.5 mg changes who was still in parking lot. Patient understood and stated he already knew about the change but was explained it was not documented on his AVS which is what's important for him to have. Patient  medication list updated on Epic.Patient appreciated that call. ?

## 2021-11-09 LAB — BASIC METABOLIC PANEL
BUN/Creatinine Ratio: 14 (ref 10–24)
BUN: 14 mg/dL (ref 8–27)
CO2: 25 mmol/L (ref 20–29)
Calcium: 8.9 mg/dL (ref 8.6–10.2)
Chloride: 103 mmol/L (ref 96–106)
Creatinine, Ser: 1.02 mg/dL (ref 0.76–1.27)
Glucose: 82 mg/dL (ref 70–99)
Potassium: 4.3 mmol/L (ref 3.5–5.2)
Sodium: 142 mmol/L (ref 134–144)
eGFR: 80 mL/min/{1.73_m2} (ref >59)

## 2021-11-09 LAB — MAGNESIUM: Magnesium: 1.9 mg/dL (ref 1.6–2.3)

## 2021-12-12 ENCOUNTER — Telehealth: Payer: Self-pay | Admitting: Adult Health

## 2021-12-12 NOTE — Telephone Encounter (Signed)
Left message for patient to call back and schedule Medicare Annual Wellness Visit (AWV) either virtually or in office. Left  my Terry Hansen number [phone]   Last AWV  12/12/20 ; please schedule at anytime with [facility] Nurse Health Advisor 1 or 2

## 2021-12-24 ENCOUNTER — Ambulatory Visit (INDEPENDENT_AMBULATORY_CARE_PROVIDER_SITE_OTHER): Payer: Medicare Other

## 2021-12-24 VITALS — Ht 71.0 in | Wt 195.0 lb

## 2021-12-24 DIAGNOSIS — Z0000 Encounter for general adult medical examination without abnormal findings: Secondary | ICD-10-CM

## 2021-12-24 NOTE — Patient Instructions (Signed)
Terry Hansen , Thank you for taking time to come for your Medicare Wellness Visit. I appreciate your ongoing commitment to your health goals. Please review the following plan we discussed and let me know if I can assist you in the future.   Screening recommendations/referrals: Colonoscopy: completed 03/05/2019, due 03/04/2026 Recommended yearly ophthalmology/optometry visit for glaucoma screening and checkup Recommended yearly dental visit for hygiene and checkup  Vaccinations: Influenza vaccine: due 02/19/2022 Pneumococcal vaccine: due Tdap vaccine: due Shingles vaccine: discussed   Covid-19: decline  Advanced directives: Please bring a copy of your POA (Power of Attorney) and/or Living Will to your next appointment.   Conditions/risks identified: none  Next appointment: Follow up in one year for your annual wellness visit.   Preventive Care 68 Years and Older, Male Preventive care refers to lifestyle choices and visits with your health care provider that can promote health and wellness. What does preventive care include? A yearly physical exam. This is also called an annual well check. Dental exams once or twice a year. Routine eye exams. Ask your health care provider how often you should have your eyes checked. Personal lifestyle choices, including: Daily care of your teeth and gums. Regular physical activity. Eating a healthy diet. Avoiding tobacco and drug use. Limiting alcohol use. Practicing safe sex. Taking low doses of aspirin every day. Taking vitamin and mineral supplements as recommended by your health care provider. What happens during an annual well check? The services and screenings done by your health care provider during your annual well check will depend on your age, overall health, lifestyle risk factors, and family history of disease. Counseling  Your health care provider may ask you questions about your: Alcohol use. Tobacco use. Drug use. Emotional  well-being. Home and relationship well-being. Sexual activity. Eating habits. History of falls. Memory and ability to understand (cognition). Work and work Statistician. Screening  You may have the following tests or measurements: Height, weight, and BMI. Blood pressure. Lipid and cholesterol levels. These may be checked every 5 years, or more frequently if you are over 41 years old. Skin check. Lung cancer screening. You may have this screening every year starting at age 14 if you have a 30-pack-year history of smoking and currently smoke or have quit within the past 15 years. Fecal occult blood test (FOBT) of the stool. You may have this test every year starting at age 84. Flexible sigmoidoscopy or colonoscopy. You may have a sigmoidoscopy every 5 years or a colonoscopy every 10 years starting at age 54. Prostate cancer screening. Recommendations will vary depending on your family history and other risks. Hepatitis C blood test. Hepatitis B blood test. Sexually transmitted disease (STD) testing. Diabetes screening. This is done by checking your blood sugar (glucose) after you have not eaten for a while (fasting). You may have this done every 1-3 years. Abdominal aortic aneurysm (AAA) screening. You may need this if you are a current or former smoker. Osteoporosis. You may be screened starting at age 31 if you are at high risk. Talk with your health care provider about your test results, treatment options, and if necessary, the need for more tests. Vaccines  Your health care provider may recommend certain vaccines, such as: Influenza vaccine. This is recommended every year. Tetanus, diphtheria, and acellular pertussis (Tdap, Td) vaccine. You may need a Td booster every 10 years. Zoster vaccine. You may need this after age 14. Pneumococcal 13-valent conjugate (PCV13) vaccine. One dose is recommended after age 56. Pneumococcal polysaccharide (  PPSV23) vaccine. One dose is recommended after  age 16. Talk to your health care provider about which screenings and vaccines you need and how often you need them. This information is not intended to replace advice given to you by your health care provider. Make sure you discuss any questions you have with your health care provider. Document Released: 08/04/2015 Document Revised: 03/27/2016 Document Reviewed: 05/09/2015 Elsevier Interactive Patient Education  2017 [place] Prevention in the Home Falls can cause injuries. They can happen to people of all ages. There are many things you can do to make your home safe and to help prevent falls. What can I do on the outside of my home? Regularly fix the edges of walkways and driveways and fix any cracks. Remove anything that might make you trip as you walk through a door, such as a raised step or threshold. Trim any bushes or trees on the path to your home. Use bright outdoor lighting. Clear any walking paths of anything that might make someone trip, such as rocks or tools. Regularly check to see if handrails are loose or broken. Make sure that both sides of any steps have handrails. Any raised decks and porches should have guardrails on the edges. Have any leaves, snow, or ice cleared regularly. Use sand or salt on walking paths during winter. Clean up any spills in your garage right away. This includes oil or grease spills. What can I do in the bathroom? Use night lights. Install grab bars by the toilet and in the tub and shower. Do not use towel bars as grab bars. Use non-skid mats or decals in the tub or shower. If you need to sit down in the shower, use a plastic, non-slip stool. Keep the floor dry. Clean up any water that spills on the floor as soon as it happens. Remove soap buildup in the tub or shower regularly. Attach bath mats securely with double-sided non-slip rug tape. Do not have throw rugs and other things on the floor that can make you trip. What can I do in the  bedroom? Use night lights. Make sure that you have a light by your bed that is easy to reach. Do not use any sheets or blankets that are too big for your bed. They should not hang down onto the floor. Have a firm chair that has side arms. You can use this for support while you get dressed. Do not have throw rugs and other things on the floor that can make you trip. What can I do in the kitchen? Clean up any spills right away. Avoid walking on wet floors. Keep items that you use a lot in easy-to-reach places. If you need to reach something above you, use a strong step stool that has a grab bar. Keep electrical cords out of the way. Do not use floor polish or wax that makes floors slippery. If you must use wax, use non-skid floor wax. Do not have throw rugs and other things on the floor that can make you trip. What can I do with my stairs? Do not leave any items on the stairs. Make sure that there are handrails on both sides of the stairs and use them. Fix handrails that are broken or loose. Make sure that handrails are as long as the stairways. Check any carpeting to make sure that it is firmly attached to the stairs. Fix any carpet that is loose or worn. Avoid having throw rugs at the top or  bottom of the stairs. If you do have throw rugs, attach them to the floor with carpet tape. Make sure that you have a light switch at the top of the stairs and the bottom of the stairs. If you do not have them, ask someone to add them for you. What else can I do to help prevent falls? Wear shoes that: Do not have high heels. Have rubber bottoms. Are comfortable and fit you well. Are closed at the toe. Do not wear sandals. If you use a stepladder: Make sure that it is fully opened. Do not climb a closed stepladder. Make sure that both sides of the stepladder are locked into place. Ask someone to hold it for you, if possible. Clearly mark and make sure that you can see: Any grab bars or  handrails. First and last steps. Where the edge of each step is. Use tools that help you move around (mobility aids) if they are needed. These include: Canes. Walkers. Scooters. Crutches. Turn on the lights when you go into a dark area. Replace any light bulbs as soon as they burn out. Set up your furniture so you have a clear path. Avoid moving your furniture around. If any of your floors are uneven, fix them. If there are any pets around you, be aware of where they are. Review your medicines with your doctor. Some medicines can make you feel dizzy. This can increase your chance of falling. Ask your doctor what other things that you can do to help prevent falls. This information is not intended to replace advice given to you by your health care provider. Make sure you discuss any questions you have with your health care provider. Document Released: 05/04/2009 Document Revised: 12/14/2015 Document Reviewed: 08/12/2014 Elsevier Interactive Patient Education  2017 [employer].

## 2021-12-24 NOTE — Progress Notes (Signed)
I connected with Terry Hansen today by telephone and verified that I am speaking with the correct person using two identifiers. Location patient: home Location provider: work Persons participating in the virtual visit: Terry Hansen, Terry Durand LPN.   I discussed the limitations, risks, security and privacy concerns of performing an evaluation and management service by telephone and the availability of in person appointments. I also discussed with the patient that there may be a patient responsible charge related to this service. The patient expressed understanding and verbally consented to this telephonic visit.    Interactive audio and video telecommunications were attempted between this provider and patient, however failed, due to patient having technical difficulties OR patient did not have access to video capability.  We continued and completed visit with audio only.     Vital signs may be patient reported or missing.  Subjective:   Terry Hansen is a 66 y.o. male who presents for Medicare Annual/Subsequent preventive examination.  Review of Systems     Cardiac Risk Factors include: advanced age (>21mn, >>69women);dyslipidemia;hypertension;male gender     Objective:    Today's Vitals   12/24/21 1426  Weight: 195 lb (88.5 kg)  Height: '5\' 11"'$  (1.803 m)   Body mass index is 27.2 kg/m.     12/24/2021    2:33 PM 12/12/2020   10:27 AM 08/16/2019    9:23 AM 07/19/2019    5:00 PM 07/19/2019   11:55 AM 07/10/2019    2:16 AM 04/08/2019    4:00 PM  Advanced Directives  Does Patient Have a Medical Advance Directive? Yes Yes Yes No No No Yes  Type of AParamedicof [place]ley LakeLiving will Living will [place] will    [place]  Does patient want to make changes to medical advance directive?       No - Patient declined  Copy of HBonneyin Chart? No - copy requested      No - copy  requested  Would patient like information on creating a medical advance directive?    No - Patient declined No - Patient declined No - Guardian declined No - Patient declined    Current Medications (verified) Outpatient Encounter Medications as of 12/24/2021  Medication Sig   amLODipine (NORVASC) 2.5 MG tablet Take 1 tablet (2.5 mg total) by mouth daily.   atorvastatin (LIPITOR) 40 MG tablet TAKE ONE TABLET BY MOUTH EVERY NIGHT AT BEDTIME   Cholecalciferol (D3) 50 MCG (2000 UT) TABS Take by mouth.   diltiazem (CARDIZEM) 30 MG tablet Take 1 tablet (30 mg total) by mouth 4 (four) times daily as needed (elevated heart rates).   fluticasone (FLONASE) 50 MCG/ACT nasal spray Place 2 sprays into both nostrils daily.   ibuprofen (ADVIL,MOTRIN) 200 MG tablet Take 400-600 mg by mouth every 6 (six) hours as needed for headache or moderate pain.    isosorbide mononitrate (IMDUR) 30 MG 24 hr tablet TAKE ONE TABLET BY MOUTH DAILY   losartan (COZAAR) 100 MG tablet TAKE ONE TABLET BY MOUTH DAILY   nitroGLYCERIN (NITROSTAT) 0.4 MG SL tablet Place 1 tablet (0.4 mg total) under the tongue every 5 (five) minutes as needed for chest pain.   omeprazole (PRILOSEC OTC) 20 MG tablet Take 20 mg by mouth daily as needed (acid reflux).    sotalol (BETAPACE) 120 MG tablet Take 1 tablet (120 mg total) by mouth every 12 (twelve) hours. Pt needs to keep upcoming appt in  April for further refills   vitamin B-12 (CYANOCOBALAMIN) 1000 MCG tablet Take 1,000 mcg by mouth daily.   aspirin EC 81 MG tablet Take 1 tablet (81 mg total) by mouth daily. (Patient not taking: Reported on 12/24/2021)   Coenzyme Q10 200 MG capsule Take 200 mg by mouth daily with breakfast.  (Patient not taking: Reported on 12/24/2021)   No facility-administered encounter medications on file as of 12/24/2021.    Allergies (verified) Patient has no known allergies.   History: Past Medical History:  Diagnosis Date   CAD (coronary artery disease)    a.  LHC  06/26/12:  pLAD 90%, prox/mid/dist CFS 20-30%, oRCA 100% (non dom), EF 55%;  b. Echo 12/13:  EF 55-60%, Gr 1 diast dysfn;  c. s/p CABG [facility]) 12/13: L-LAD/Dx     GERD (gastroesophageal reflux disease)    HLD (hyperlipidemia)    Hypertension    Ocular migraine    "maybe once q couple months" (06/02/2014)   Unstable angina pectoris [facility])    Past Surgical History:  Procedure Laterality Date   APPENDECTOMY  1970's   CARDIAC CATHETERIZATION  06/2012   CORONARY ARTERY BYPASS GRAFT  06/29/2012   Procedure: CORONARY ARTERY BYPASS GRAFTING (CABG);  Surgeon: Melrose Nakayama, MD;  Location: [place];  Service: Open Heart Surgery;  Laterality: N/A;  times two using Left Internal Mammary Artery   INGUINAL HERNIA REPAIR Bilateral [phone]   LEFT HEART CATH AND CORS/GRAFTS ANGIOGRAPHY N/A 08/16/2019   Procedure: LEFT HEART CATH AND CORS/GRAFTS ANGIOGRAPHY;  Surgeon: Leonie Man, MD;  Location: [place] [facility];  Service: Cardiovascular;  Laterality: N/A;   LEFT HEART CATHETERIZATION WITH CORONARY ANGIOGRAM N/A 06/26/2012   Procedure: LEFT HEART CATHETERIZATION WITH CORONARY ANGIOGRAM;  Surgeon: Josue Hector, MD;  Location[facility] C[facility];  Service: Cardiovascular;  Laterality: N/A;   LEFT HEART CATHETERIZATION WITH CORONARY/GRAFT ANGIOGRAM N/A 06/03/2014   Procedure: LEFT HEART CATHETERIZATION WITH Terry Hansen;  Surgeon: Jettie Booze, MD;  Location: [facility] C[facility] LAB;  Service: Cardiovascular;  Laterality: N/A;   NASAL SEPTUM SURGERY     For uncontrolled epistaxis early 2013   SIGMOIDOSCOPY  10-15 years ago   in [place], [place]   Family History  Problem Relation Age of Onset   Heart disease Brother        CABG age 23, redo bypass ~50   Heart disease Paternal Grandmother    Heart disease Cousin        Maternal side   Heart attack Brother    Colon cancer Maternal Grandfather 60       METS to colon from Lung CA per pt   Lung cancer Maternal Grandfather    Colon polyps Neg  Hx    Esophageal cancer Neg Hx    Rectal cancer Neg Hx    Stomach cancer Neg Hx    Social History   Socioeconomic History   Marital status: Divorced    Spouse name: Not on file   Number of children: Not on file   Years of education: Not on file   Highest education level: Not on file  Occupational History   Occupation: retired  Tobacco Use   Smoking status: Never   Smokeless tobacco: Never  Vaping Use   Vaping Use: Never used  Substance and Sexual Activity   Alcohol use: Yes    Comment: occ-Social per pt   Drug use: No   Sexual activity: Not Currently  Other Topics Concern   Not  on file  Social History Narrative   Divorced 7 years ago   3 children   Works in Aeronautical engineer   Social Determinants of Radio broadcast assistant Strain: Low Risk    Difficulty of Paying Living Expenses: Not hard at all  Food Insecurity: No Food Insecurity   Worried About Charity fundraiser in the Last Year: Never true   Arboriculturist in the Last Year: Never true  Transportation Needs: No Transportation Needs   Lack of Transportation (Medical): No   Lack of Transportation (Non-Medical): No  Physical Activity: Sufficiently Active   Days of Exercise per Week: 3 days   Minutes of Exercise per Session: 60 min  Stress: No Stress Concern Present   Feeling of Stress : Not at all  Social Connections: Not on file    Tobacco Counseling Counseling given: Not Answered   Clinical Intake:  Pre-visit preparation completed: Yes  Pain : No/denies pain     Nutritional Status: BMI 25 -29 Overweight Nutritional Risks: None Diabetes: No  How often do you need to have someone help you when you read instructions, pamphlets, or other written materials from your doctor or pharmacy?: 1 - Never What is the last grade level you completed in school?: 71yrcollege  Diabetic? no  Interpreter Needed?: No  Information entered by :: NAllen LPN   Activities of Daily Living    12/24/2021     2:35 PM  In your present state of health, do you have any difficulty performing the following activities:  Hearing? 0  Vision? 0  Difficulty concentrating or making decisions? 0  Walking or climbing stairs? 0  Dressing or bathing? 0  Doing errands, shopping? 0  Preparing Food and eating ? N  Using the Toilet? N  In the past six months, have you accidently leaked urine? N  Do you have problems with loss of bowel control? N  Managing your Medications? N  Managing your Finances? N  Housekeeping or managing your Housekeeping? N    Patient Care Team: NDorothyann Peng NP as PCP - General (Family Medicine) NJosue Hector MD as PCP - Cardiology (Cardiology) CConstance Haw MD as PCP - Electrophysiology (Cardiology) WVerl Blalock TMarijo Conception MD (Inactive) as Attending Physician (Cardiology) PViona Gilmore [facility]Con[facility] Centeras Pharmacist (Pharmacist)  Indicate any recent Medical Services you may have received from other than [facility] providers in the past year (date may be approximate).     Assessment:   This is a routine wellness examination for DEl Mangi  Hearing/Vision screen Vision Screening - Comments:: Regular eye exams,   Dietary issues and exercise activities discussed: Current Exercise Habits: Home exercise routine, Type of exercise: Other - see comments (rowing machine), Time (Minutes): 60, Frequency (Times/Week): 3, Weekly Exercise (Minutes/Week): 180   Goals Addressed             This Visit's Progress    Patient Stated       12/24/2021, stay active       Depression Screen    12/24/2021    2:34 PM 07/18/2021    9:41 AM 12/12/2020   10:26 AM 09/01/2020    8:01 AM 04/04/2020    8:19 AM 01/02/2017    1:02 PM 10/24/2014    3:45 PM  PHQ 2/9 Scores  PHQ - 2 Score 0 0 0 0 0 2 2  PHQ- 9 Score     0 4 9    Fall Risk    12/24/2021  2:34 PM 07/18/2021    9:41 AM 06/22/2021    1:25 PM 12/12/2020   10:28 AM 09/01/2020    8:01 AM  Fall Risk   Falls in the past year? 0 0 0 0 0  Number  falls in past yr: 0 0  0   Injury with Fall? 0 0  0   Risk for fall due to : Medication side effect No Fall Risks  Impaired vision   Follow up Falls evaluation completed;Education provided;Falls prevention discussed Falls evaluation completed  Falls prevention discussed     FALL RISK PREVENTION PERTAINING TO THE HOME:  Any stairs in or around the home? Yes  If so, are there any without handrails? Yes  Home free of loose throw rugs in walkways, pet beds, electrical cords, etc? Yes  Adequate lighting in your home to reduce risk of falls? Yes   ASSISTIVE DEVICES UTILIZED TO PREVENT FALLS:  Life alert? No  Use of a cane, walker or w/c? No  Grab bars in the bathroom? No  Shower chair or bench in shower? Yes  Elevated toilet seat or a handicapped toilet? No   TIMED UP AND GO:  Was the test performed? No .      Cognitive Function:        12/24/2021    2:37 PM 12/12/2020   10:30 AM  6CIT Screen  What Year? 0 points 0 points  What month? 0 points 0 points  What time? 0 points 0 points  Count back from 20 0 points 0 points  Months in reverse 0 points 0 points  Repeat phrase 4 points 2 points  Total Score 4 points 2 points    Immunizations Immunization History  Administered Date(s) Administered   Fluad Quad(high Dose 65+) 09/06/2020   Pneumococcal Conjugate-13 06/15/2019    TDAP status: Due, Education has been provided regarding the importance of this vaccine. Advised may receive this vaccine at local pharmacy or Health Dept. Aware to provide a copy of the vaccination record if obtained from local pharmacy or Health Dept. Verbalized acceptance and understanding.  Flu Vaccine status: Up to date  Pneumococcal vaccine status: Due, Education has been provided regarding the importance of this vaccine. Advised may receive this vaccine at local pharmacy or Health Dept. Aware to provide a copy of the vaccination record if obtained from local pharmacy or Health Dept. Verbalized  acceptance and understanding.  Covid-19 vaccine status: Declined, Education has been provided regarding the importance of this vaccine but patient still declined. Advised may receive this vaccine at local pharmacy or Health Dept.or vaccine clinic. Aware to provide a copy of the vaccination record if obtained from local pharmacy or Health Dept. Verbalized acceptance and understanding.  Qualifies for Shingles Vaccine? Yes   Zostavax completed No   Shingrix Completed?: No.    Education has been provided regarding the importance of this vaccine. Patient has been advised to call insurance company to determine out of pocket expense if they have not yet received this vaccine. Advised may also receive vaccine at local pharmacy or Health Dept. Verbalized acceptance and understanding.  Screening Tests Health Maintenance  Topic Date Due   COVID-19 Vaccine (1) Never done   TETANUS/TDAP  Never done   Zoster Vaccines- Shingrix (1 of 2) Never done   Pneumonia Vaccine 45+ Years old (2 - PPSV23 if available, else PCV20) 06/14/2020   INFLUENZA VACCINE  02/19/2022   COLONOSCOPY (Pts 45-28yr Insurance coverage will need to be confirmed)  03/04/2026  Hepatitis C Screening  Completed   HPV VACCINES  Aged Out    Health Maintenance  Health Maintenance Due  Topic Date Due   COVID-19 Vaccine (1) Never done   TETANUS/TDAP  Never done   Zoster Vaccines- Shingrix (1 of 2) Never done   Pneumonia Vaccine 58+ Years old (2 - PPSV23 if available, else PCV20) 06/14/2020    Colorectal cancer screening: Type of screening: Colonoscopy. Completed 03/05/2019. Repeat every 7 years  Lung Cancer Screening: (Low Dose CT Chest recommended if Age 2-80 years, 30 pack-year currently smoking OR have quit w/in 15years.) does not qualify.   Lung Cancer Screening Referral: no  Additional Screening:  Hepatitis C Screening: does qualify; Completed 01/27/2017  Vision Screening: Recommended annual ophthalmology exams for early  detection of glaucoma and other disorders of the eye. Is the patient up to date with their annual eye exam?  Yes  Who is the provider or what is the name of the office in which the patient attends annual eye exams? none If pt is not established with a provider, would they like to be referred to a provider to establish care? No .   Dental Screening: Recommended annual dental exams for proper oral hygiene  Community Resource Referral / Chronic Care Management: CRR required this visit?  No   CCM required this visit?  No      Plan:     I have personally reviewed and noted the following in the patient's chart:   Medical and social history Use of alcohol, tobacco or illicit drugs  Current medications and supplements including opioid prescriptions. Patient is not currently taking opioid prescriptions. Functional ability and status Nutritional status Physical activity Advanced directives List of other physicians Hospitalizations, surgeries, and ER visits in previous 12 months Vitals Screenings to include cognitive, depression, and falls Referrals and appointments  In addition, I have reviewed and discussed with patient certain preventive protocols, quality metrics, and best practice recommendations. A written personalized care plan for preventive services as well as general preventive health recommendations were provided to patient.     Kellie Simmering, LPN   11/21/9765   Nurse Notes: none  Due to this being a virtual visit, the after visit summary with patients personalized plan was offered to patient via mail or my-chart. Patient preferred to pick up at office at next visit

## 2021-12-31 ENCOUNTER — Other Ambulatory Visit: Payer: Self-pay

## 2021-12-31 MED ORDER — SOTALOL HCL 120 MG PO TABS: 120.0000 mg | ORAL_TABLET | Freq: Two times a day (BID) | ORAL | 3 refills | Status: DC

## 2022-01-11 ENCOUNTER — Other Ambulatory Visit: Payer: Self-pay | Admitting: Cardiovascular Disease

## 2022-03-30 ENCOUNTER — Other Ambulatory Visit: Payer: Self-pay | Admitting: Cardiovascular Disease

## 2022-04-02 MED ORDER — AMLODIPINE BESYLATE 2.5 MG PO TABS: 2.5000 mg | ORAL_TABLET | Freq: Every day | ORAL | 2 refills | Status: DC

## 2022-04-06 ENCOUNTER — Other Ambulatory Visit: Payer: Self-pay | Admitting: Cardiovascular Disease

## 2022-04-30 ENCOUNTER — Telehealth: Payer: Self-pay | Admitting: Physician Assistant

## 2022-04-30 NOTE — Telephone Encounter (Signed)
Patient is calling for the lab results he had done back in April.

## 2022-04-30 NOTE — Telephone Encounter (Signed)
Spoke with the patient and advised him that lab work from April was normal.

## 2022-05-21 ENCOUNTER — Telehealth: Payer: Self-pay | Admitting: Adult Health

## 2022-05-21 DIAGNOSIS — J069 Acute upper respiratory infection, unspecified: Secondary | ICD-10-CM

## 2022-05-21 MED ORDER — FLUTICASONE PROPIONATE 50 MCG/ACT NA SUSP: 2.0000 | Freq: Every day | NASAL | 6 refills | Status: DC

## 2022-05-21 NOTE — Telephone Encounter (Signed)
Pt notified that Rx has been sent in. No further action needed!

## 2022-05-21 NOTE — Addendum Note (Signed)
Addended by: Gwenyth Ober R on: 05/21/2022 03:36 PM   Modules accepted: Orders

## 2022-05-21 NOTE — Telephone Encounter (Signed)
Refill fluticasone (FLONASE) 50 MCG/ACT nasal spray   [facility] [id number] [facility], [place] - [address] Phone:  [phone]  Fax:  [phone]

## 2022-06-26 ENCOUNTER — Ambulatory Visit (INDEPENDENT_AMBULATORY_CARE_PROVIDER_SITE_OTHER): Payer: Medicare Other | Admitting: Adult Health

## 2022-06-26 ENCOUNTER — Encounter: Payer: Self-pay | Admitting: Adult Health

## 2022-06-26 ENCOUNTER — Telehealth: Payer: Self-pay | Admitting: Adult Health

## 2022-06-26 VITALS — BP 118/86 | HR 64 | Temp 98.0°F | Ht 70.5 in | Wt 197.0 lb

## 2022-06-26 DIAGNOSIS — I10 Essential (primary) hypertension: Secondary | ICD-10-CM

## 2022-06-26 DIAGNOSIS — R3912 Poor urinary stream: Secondary | ICD-10-CM | POA: Diagnosis not present

## 2022-06-26 DIAGNOSIS — N401 Enlarged prostate with lower urinary tract symptoms: Secondary | ICD-10-CM

## 2022-06-26 DIAGNOSIS — I2510 Atherosclerotic heart disease of native coronary artery without angina pectoris: Secondary | ICD-10-CM | POA: Diagnosis not present

## 2022-06-26 DIAGNOSIS — I4719 Other supraventricular tachycardia: Secondary | ICD-10-CM | POA: Diagnosis not present

## 2022-06-26 DIAGNOSIS — E785 Hyperlipidemia, unspecified: Secondary | ICD-10-CM

## 2022-06-26 DIAGNOSIS — F419 Anxiety disorder, unspecified: Secondary | ICD-10-CM

## 2022-06-26 DIAGNOSIS — F32A Depression, unspecified: Secondary | ICD-10-CM

## 2022-06-26 LAB — LIPID PANEL
Cholesterol: 166 mg/dL (ref 0–200)
HDL: 42.3 mg/dL (ref >39.00)
LDL Cholesterol: 110 mg/dL — ABNORMAL HIGH (ref 0–99)
NonHDL: 124.02
Total CHOL/HDL Ratio: 4
Triglycerides: 69 mg/dL (ref 0.0–149.0)
VLDL: 13.8 mg/dL (ref 0.0–40.0)

## 2022-06-26 LAB — COMPREHENSIVE METABOLIC PANEL
ALT: 12 U/L (ref 0–53)
AST: 12 U/L (ref 0–37)
Albumin: 4.1 g/dL (ref 3.5–5.2)
Alkaline Phosphatase: 117 U/L (ref 39–117)
BUN: 11 mg/dL (ref 6–23)
CO2: 31 mEq/L (ref 19–32)
Calcium: 9.1 mg/dL (ref 8.4–10.5)
Chloride: 102 mEq/L (ref 96–112)
Creatinine, Ser: 0.99 mg/dL (ref 0.40–1.50)
GFR: 78.1 mL/min (ref >60.00)
Glucose, Bld: 97 mg/dL (ref 70–99)
Potassium: 4.6 mEq/L (ref 3.5–5.1)
Sodium: 140 mEq/L (ref 135–145)
Total Bilirubin: 0.6 mg/dL (ref 0.2–1.2)
Total Protein: 6.4 g/dL (ref 6.0–8.3)

## 2022-06-26 LAB — CBC WITH DIFFERENTIAL/PLATELET
Basophils Absolute: 0 10*3/uL (ref 0.0–0.1)
Basophils Relative: 0.3 % (ref 0.0–3.0)
Eosinophils Absolute: 0.4 10*3/uL (ref 0.0–0.7)
Eosinophils Relative: 4.8 % (ref 0.0–5.0)
HCT: 45.4 % (ref 39.0–52.0)
Hemoglobin: 15.8 g/dL (ref 13.0–17.0)
Lymphocytes Relative: 21 % (ref 12.0–46.0)
Lymphs Abs: 1.8 10*3/uL (ref 0.7–4.0)
MCHC: 34.8 g/dL (ref 30.0–36.0)
MCV: 85.9 fl (ref 78.0–100.0)
Monocytes Absolute: 0.7 10*3/uL (ref 0.1–1.0)
Monocytes Relative: 7.5 % (ref 3.0–12.0)
Neutro Abs: 5.8 10*3/uL (ref 1.4–7.7)
Neutrophils Relative %: 66.4 % (ref 43.0–77.0)
Platelets: 273 10*3/uL (ref 150.0–400.0)
RBC: 5.29 Mil/uL (ref 4.22–5.81)
RDW: 13.4 % (ref 11.5–15.5)
WBC: 8.8 10*3/uL (ref 4.0–10.5)

## 2022-06-26 LAB — TSH: TSH: 1.69 u[IU]/mL (ref 0.35–5.50)

## 2022-06-26 LAB — PSA: PSA: 1.37 ng/mL (ref 0.10–4.00)

## 2022-06-26 NOTE — Patient Instructions (Addendum)
It was great seeing you today   We will follow up with you regarding your lab work   Please let me know if you need anything   Start taking your statin three times a week ( every other day)

## 2022-06-26 NOTE — Telephone Encounter (Signed)
Updated patient on his labs. LDL slightly elevated. He will try taking his statin every other day

## 2022-06-26 NOTE — Progress Notes (Signed)
Subjective:    Patient ID: Terry Hansen, male    DOB: 1955/08/07, 66 y.o.   MRN: [id number]  HPI Patient presents for yearly preventative medicine examination. He is a pleasant 66 year old male who  has a past medical history of CAD (coronary artery disease), GERD (gastroesophageal reflux disease), HLD (hyperlipidemia), Hypertension, Ocular migraine, and Unstable angina pectoris ([place]).  Hypertension-currently managed with losartan 100  mg daily and Norvasc 2.5 mg daily.  He did have some lower extremity edema with higher doses of Norvasc. he denies dizziness, lightheadedness, chest pain, shortness of breath, or syncopal episodes BP Readings from Last 3 Encounters:  06/26/22 118/86  11/08/21 122/68  06/22/21 132/90   CAD status post CABG-followed by cardiology on a routine basis.  His CABG was in 2013.  He denies palpitations, chest pain, shortness of breath, orthopnea, PND, lower extremity edema, claudication, dizziness.  He is managed with Lipitor 40 mg daily( stopped in July due to myalgia)  and aspirin 81 mg Lab Results  Component Value Date   CHOL 111 09/01/2020   HDL 39.10 09/01/2020   LDLCALC 57 09/01/2020   TRIG 73.0 09/01/2020   CHOLHDL 3 09/01/2020   Atrial tachycardia-followed by cardiology.  Managed with Cardizem PRN and sotalol120 mg daily.   Anxiety and depression no longer taking Celexa. [place].   BPH - has started to experience decreased stream and nocturia ( 2 x a night)  All immunizations and health maintenance protocols were reviewed with the patient and needed orders were placed. Declines all vaccinations   Appropriate screening laboratory values were ordered for the patient including screening of hyperlipidemia, renal function and hepatic function. If indicated by BPH, a PSA was ordered.  Medication reconciliation,  past medical history, social history, problem list and allergies were reviewed in detail  with the patient  Goals were established with regard to weight loss, exercise, and  diet in compliance with medications Wt Readings from Last 3 Encounters:  06/26/22 197 lb (89.4 kg)  12/24/21 195 lb (88.5 kg)  11/08/21 198 lb 3.2 oz (89.9 kg)   He is up to date on routine colon cancer screening   Review of Systems  Constitutional: Negative.   HENT: Negative.    Eyes: Negative.   Respiratory: Negative.    Cardiovascular: Negative.   Gastrointestinal: Negative.   Endocrine: Negative.   Genitourinary: Negative.   Musculoskeletal: Negative.   Skin: Negative.   Allergic/Immunologic: Negative.   Neurological: Negative.   Hematological: Negative.   Psychiatric/Behavioral: Negative.    All other systems reviewed and are negative.  Past Medical History:  Diagnosis Date   CAD (coronary artery disease)    a.  LHC 06/26/12:  pLAD 90%, prox/mid/dist CFS 20-30%, oRCA 100% (non dom), EF 55%;  b. Echo 12/13:  EF 55-60%, Gr 1 diast dysfn;  c. s/p CABG Roxan Hockey) 12/13: L-LAD/Dx     GERD (gastroesophageal reflux disease)    HLD (hyperlipidemia)    Hypertension    Ocular migraine    "maybe once q couple months" (06/02/2014)   Unstable angina pectoris [facility])     Social History   Socioeconomic History   Marital status: Divorced    Spouse name: Not on file   Number of children: Not on file   Years of education: Not on file   Highest education level: Not on file  Occupational History   Occupation: retired  Tobacco Use   Smoking  status: Never   Smokeless tobacco: Never  Vaping Use   Vaping Use: Never used  Substance and Sexual Activity   Alcohol use: Yes    Comment: occ-Social per pt   Drug use: No   Sexual activity: Not Currently  Other Topics Concern   Not on file  Social History Narrative   Divorced 7 years ago   3 children   Works in Aeronautical engineer   Social Determinants of Health   Financial Resource Strain: [place]  (12/24/2021)   Overall Financial Resource  Strain (CARDIA)    Difficulty of Paying Living Expenses: Not hard at all  Food Insecurity: No Food Insecurity (12/24/2021)   Hunger Vital Sign    Worried About Running Out of Food in the Last Year: Never true    [place] in the Last Year: Never true  Transportation Needs: No Transportation Needs (12/24/2021)   PRAPARE - Hydrologist (Medical): No    Lack of Transportation (Non-Medical): No  Physical Activity: Sufficiently Active (12/24/2021)   Exercise Vital Sign    Days of Exercise per Week: 3 days    Minutes of Exercise per Session: 60 min  Stress: No Stress Concern Present (12/24/2021)   [place]    Feeling of Stress : Not at all  Social Connections: Moderately Isolated (12/12/2020)   Social Connection and Isolation Panel [NHANES]    Frequency of Communication with Friends and Family: More than three times a week    Frequency of Social Gatherings with Friends and Family: More than three times a week    Attends Religious Services: 1 to 4 times per year    Active Member of [employer] or Organizations: No    Attends Archivist Meetings: Never    Marital Status: Divorced  Human resources officer Violence: Not At Risk (12/12/2020)   Humiliation, Afraid, Rape, and Kick questionnaire    Fear of Current or Ex-Partner: No    Emotionally Abused: No    Physically Abused: No    Sexually Abused: No    Past Surgical History:  Procedure Laterality Date   APPENDECTOMY  1970's   CARDIAC CATHETERIZATION  06/2012   CORONARY ARTERY BYPASS GRAFT  06/29/2012   Procedure: CORONARY ARTERY BYPASS GRAFTING (CABG);  Surgeon: Melrose Nakayama, MD;  Location: [place];  Service: Open Heart Surgery;  Laterality: N/A;  times two using Left Internal Mammary Artery   INGUINAL HERNIA REPAIR Bilateral [phone]   LEFT HEART CATH AND CORS/GRAFTS ANGIOGRAPHY N/A 08/16/2019   Procedure: LEFT HEART CATH AND CORS/GRAFTS  ANGIOGRAPHY;  Surgeon: Leonie Man, MD;  Location: [place] [facility];  Service: Cardiovascular;  Laterality: N/A;   LEFT HEART CATHETERIZATION WITH CORONARY ANGIOGRAM N/A 06/26/2012   Procedure: LEFT HEART CATHETERIZATION WITH CORONARY ANGIOGRAM;  Surgeon: Josue Hector, MD;  Location: [facility] CATH LAB;  Service: Cardiovascular;  Laterality: N/A;   LEFT HEART CATHETERIZATION WITH CORONARY/GRAFT ANGIOGRAM N/A 06/03/2014   Procedure: LEFT HEART CATHETERIZATION WITH Beatrix Fetters;  Surgeon: Jettie Booze, MD;  Location: [facility] CATH LAB;  Service: Cardiovascular;  Laterality: N/A;   NASAL SEPTUM SURGERY     For uncontrolled epistaxis early 2013   SIGMOIDOSCOPY  10-15 years ago   in [place], [place]    Family History  Problem Relation Age of Onset   Heart disease Brother        CABG age 68, redo bypass ~50  Heart disease Paternal Grandmother    Heart disease Cousin        Maternal side   Heart attack Brother    Colon cancer Maternal Grandfather 60       METS to colon from Lung CA per pt   Lung cancer Maternal Grandfather    Colon polyps Neg Hx    Esophageal cancer Neg Hx    Rectal cancer Neg Hx    Stomach cancer Neg Hx     No Known Allergies  Current Outpatient Medications on File Prior to Visit  Medication Sig Dispense Refill   amLODipine (NORVASC) 2.5 MG tablet Take 1 tablet (2.5 mg total) by mouth daily. 90 tablet 2   Cholecalciferol (D3) 50 MCG (2000 UT) TABS Take by mouth.     diltiazem (CARDIZEM) 30 MG tablet Take 1 tablet (30 mg total) by mouth 4 (four) times daily as needed (elevated heart rates). 30 tablet 3   fluticasone (FLONASE) 50 MCG/ACT nasal spray Place 2 sprays into both nostrils daily. 16 g 6   ibuprofen (ADVIL,MOTRIN) 200 MG tablet Take 400-600 mg by mouth every 6 (six) hours as needed for headache or moderate pain.      isosorbide mononitrate (IMDUR) 30 MG 24 hr tablet TAKE ONE TABLET BY MOUTH DAILY 90 tablet 1   losartan (COZAAR) 100 MG tablet TAKE  ONE TABLET BY MOUTH DAILY 90 tablet 3   nitroGLYCERIN (NITROSTAT) 0.4 MG SL tablet Place 1 tablet (0.4 mg total) under the tongue every 5 (five) minutes as needed for chest pain. 25 tablet 3   omeprazole (PRILOSEC OTC) 20 MG tablet Take 20 mg by mouth daily as needed (acid reflux).      sotalol (BETAPACE) 120 MG tablet Take 1 tablet (120 mg total) by mouth every 12 (twelve) hours. 180 tablet 3   vitamin B-12 (CYANOCOBALAMIN) 1000 MCG tablet Take 1,000 mcg by mouth daily.     No current facility-administered medications on file prior to visit.    BP 118/86   Pulse 64   Temp 98 F (36.7 C) (Oral)   Ht 5' 10.5" (1.791 m)   Wt 197 lb (89.4 kg)   SpO2 98%   BMI 27.87 kg/m       Objective:   Physical Exam Vitals and nursing note reviewed.  Constitutional:      General: He is not in acute distress.    Appearance: Normal appearance. He is well-developed and normal weight.  HENT:     Head: Normocephalic and atraumatic.     Right Ear: Tympanic membrane, ear canal and external ear normal. There is no impacted cerumen.     Left Ear: Tympanic membrane, ear canal and external ear normal. There is no impacted cerumen.     Nose: Nose normal. No congestion or rhinorrhea.     Mouth/Throat:     Mouth: Mucous membranes are moist.     Pharynx: Oropharynx is clear. No oropharyngeal exudate or posterior oropharyngeal erythema.  Eyes:     General:        Right eye: No discharge.        Left eye: No discharge.     Extraocular Movements: Extraocular movements intact.     Conjunctiva/sclera: Conjunctivae normal.     Pupils: Pupils are equal, round, and reactive to light.  Neck:     Vascular: No carotid bruit.     Trachea: No tracheal deviation.  Cardiovascular:     Rate and Rhythm: Normal rate and regular rhythm.  Pulses: Normal pulses.     Heart sounds: Normal heart sounds. No murmur heard.    No friction rub. No gallop.  Pulmonary:     Effort: Pulmonary effort is normal. No respiratory  distress.     Breath sounds: Normal breath sounds. No stridor. No wheezing, rhonchi or rales.  Chest:     Chest wall: No tenderness.  Abdominal:     General: Bowel sounds are normal. There is no distension.     Palpations: Abdomen is soft. There is no mass.     Tenderness: There is no abdominal tenderness. There is no right CVA tenderness, left CVA tenderness, guarding or rebound.     Hernia: No hernia is present.  Musculoskeletal:        General: No swelling, tenderness, deformity or signs of injury. Normal range of motion.     Right lower leg: No edema.     Left lower leg: No edema.  Lymphadenopathy:     Cervical: No cervical adenopathy.  Skin:    General: Skin is warm and dry.     Capillary Refill: Capillary refill takes less than 2 seconds.     Coloration: Skin is not jaundiced or pale.     Findings: No bruising, erythema, lesion or rash.  Neurological:     General: No focal deficit present.     Mental Status: He is alert and oriented to person, place, and time.     Cranial Nerves: No cranial nerve deficit.     Sensory: No sensory deficit.     Motor: No weakness.     Coordination: Coordination normal.     Gait: Gait normal.     Deep Tendon Reflexes: Reflexes normal.  Psychiatric:        Mood and Affect: Mood normal.        Behavior: Behavior normal.        Thought Content: Thought content normal.        Judgment: Judgment normal.       Assessment & Plan:  1. Essential hypertension - Well controlled  - No change in medications  - CBC with Differential/Platelet; Future - Comprehensive metabolic panel; Future - Lipid panel; Future - TSH; Future  2. Coronary artery disease involving native coronary artery of native heart without angina pectoris - Follow up with cardiology as directed - CBC with Differential/Platelet; Future - Comprehensive metabolic panel; Future - Lipid panel; Future - TSH; Future  3. Anxiety and depression - No longer needing Celexa  - CBC  with Differential/Platelet; Future - Comprehensive metabolic panel; Future - Lipid panel; Future - TSH; Future  4. Atrial tachycardia - SR today  - CBC with Differential/Platelet; Future - Comprehensive metabolic panel; Future - Lipid panel; Future - TSH; Future  5. Hyperlipidemia, unspecified hyperlipidemia type - Advised to try taking statin every other day  - CBC with Differential/Platelet; Future - Comprehensive metabolic panel; Future - Lipid panel; Future - TSH; Future  6. Benign prostatic hyperplasia with weak urinary stream - Does not want to start medications  - PSA; Future  [name], NP

## 2022-06-27 ENCOUNTER — Other Ambulatory Visit: Payer: Self-pay | Admitting: Cardiovascular Disease

## 2022-06-28 ENCOUNTER — Other Ambulatory Visit: Payer: Self-pay | Admitting: Cardiovascular Disease

## 2022-08-12 NOTE — Progress Notes (Signed)
Patient ID: Terry Hansen, male   DOB: 1956-01-27, 67 y.o.   MRN: [id number]             67 y.o. CABG 2013 LIMA to LAD/Diagonal patent cath 06/03/14 CRF;s HTN, HLD. Has histrory of atrial tachycardia sees [name] for On cardizem and Sotolol ( previously flecainide but changed due to CAD )  Issues with Multaq being cost prohibitive  Fatigue with beta blockers Last myovue1/19/21  apical infarct mild peri infarct ischemia on imdur. Cath 08/16/19 with patent LIMA to LAD/D1 no intervention needed   Retired from [employer] Has son in [place] and brother in [place] in [place] Has a bad heart with previous CABG and recent TAVR   D/c from hospital on 07/22/19 with chest pain and palpitations had runs of atrial tachycardia and sotolol started , cardizem D/c due to bradycardia Myovue done 08/10/19 with EF 50% medium sized anterior infarct with peri infarct ischemia.   Cath 08/16/19 no targets for PCI, patent LIMA to D1/LAD large left dominant circumflex and non dominant RCA without significant disease   Seen by [facility] March 2021 and stable arrhythmia on Sotolol  Stopped statin in July for myalgias LDL up to 110 06/26/22 was on lipitor 40 mg. Tolerating after holiday   M[place] [facility] Having some issues with sons in laws wanting him to go to same church And spend too much time together He tends to be more of a loner. He stays at hotel when back in [place]   No Known Allergies   Current Outpatient Medications  Medication Sig Dispense Refill   amLODipine (NORVASC) 2.5 MG tablet Take 1 tablet (2.5 mg total) by mouth daily. 90 tablet 2   atorvastatin (LIPITOR) 40 MG tablet Take 40 mg by mouth daily.     Cholecalciferol (D3) 50 MCG (2000 UT) TABS Take by mouth.     diltiazem (CARDIZEM) 30 MG tablet Take 1 tablet (30 mg total) by mouth 4 (four) times daily as needed (elevated heart rates). 30 tablet 3   fluticasone (FLONASE) 50 MCG/ACT nasal spray Place 2 sprays into both  nostrils daily. 16 g 6   ibuprofen (ADVIL,MOTRIN) 200 MG tablet Take 400-600 mg by mouth every 6 (six) hours as needed for headache or moderate pain.      isosorbide mononitrate (IMDUR) 30 MG 24 hr tablet TAKE ONE TABLET BY MOUTH DAILY 90 tablet 1   losartan (COZAAR) 100 MG tablet TAKE ONE TABLET BY MOUTH DAILY 89 tablet 2   nitroGLYCERIN (NITROSTAT) 0.4 MG SL tablet Place 1 tablet (0.4 mg total) under the tongue every 5 (five) minutes as needed for chest pain. 25 tablet 3   omeprazole (PRILOSEC OTC) 20 MG tablet Take 20 mg by mouth daily as needed (acid reflux).      sotalol (BETAPACE) 120 MG tablet Take 1 tablet (120 mg total) by mouth every 12 (twelve) hours. 180 tablet 3   vitamin B-12 (CYANOCOBALAMIN) 1000 MCG tablet Take 1,000 mcg by mouth daily.     No current facility-administered medications for this visit.    Past Medical History:  Diagnosis Date   CAD (coronary artery disease)    a.  LHC 06/26/12:  pLAD 90%, prox/mid/dist CFS 20-30%, oRCA 100% (non dom), EF 55%;  b. Echo 12/13:  EF 55-60%, Gr 1 diast dysfn;  c. s/p CABG Roxan Hockey) 12/13: L-LAD/Dx     GERD (gastroesophageal reflux disease)    HLD (hyperlipidemia)    Hypertension  Ocular migraine    "maybe once q couple months" (06/02/2014)   Unstable angina pectoris [facility])     Past Surgical History:  Procedure Laterality Date   APPENDECTOMY  1970's   CARDIAC CATHETERIZATION  06/2012   CORONARY ARTERY BYPASS GRAFT  06/29/2012   Procedure: CORONARY ARTERY BYPASS GRAFTING (CABG);  Surgeon: Melrose Nakayama, MD;  Location: [place];  Service: Open Heart Surgery;  Laterality: N/A;  times two using Left Internal Mammary Artery   INGUINAL HERNIA REPAIR Bilateral [phone]   LEFT HEART CATH AND CORS/GRAFTS ANGIOGRAPHY N/A 08/16/2019   Procedure: LEFT HEART CATH AND CORS/GRAFTS ANGIOGRAPHY;  Surgeon: Leonie Man, MD;  Location: [place] [facility];  Service: Cardiovascular;  Laterality: N/A;   LEFT HEART CATHETERIZATION WITH  CORONARY ANGIOGRAM N/A 06/26/2012   Procedure: LEFT HEART CATHETERIZATION WITH CORONARY ANGIOGRAM;  Surgeon: Josue Hector, MD;  Location: [facility] CATH LAB;  Service: Cardiovascular;  Laterality: N/A;   LEFT HEART CATHETERIZATION WITH CORONARY/GRAFT ANGIOGRAM N/A 06/03/2014   Procedure: LEFT HEART CATHETERIZATION WITH Beatrix Fetters;  Surgeon: Jettie Booze, MD;  Location: [facility] CATH LAB;  Service: Cardiovascular;  Laterality: N/A;   NASAL SEPTUM SURGERY     For uncontrolled epistaxis early 2013   SIGMOIDOSCOPY  10-15 years ago   in [place], [place]    Family History  Problem Relation Age of Onset   Heart disease Brother        CABG age 64, redo bypass ~50   Heart disease Paternal Grandmother    Heart disease Cousin        Maternal side   Heart attack Brother    Colon cancer Maternal Grandfather 60       METS to colon from Lung CA per pt   Lung cancer Maternal Grandfather    Colon polyps Neg Hx    Esophageal cancer Neg Hx    Rectal cancer Neg Hx    Stomach cancer Neg Hx     Social History   Socioeconomic History   Marital status: Divorced    Spouse name: Not on file   Number of children: Not on file   Years of education: Not on file   Highest education level: Not on file  Occupational History   Occupation: retired  Tobacco Use   Smoking status: Never   Smokeless tobacco: Never  Vaping Use   Vaping Use: Never used  Substance and Sexual Activity   Alcohol use: Yes    Comment: occ-Social per pt   Drug use: No   Sexual activity: Not Currently  Other Topics Concern   Not on file  Social History Narrative   Divorced 7 years ago   3 children   Works in Aeronautical engineer   Social Determinants of Health   Financial Resource Strain: [place]  (12/24/2021)   Overall Financial Resource Strain (CARDIA)    Difficulty of Paying Living Expenses: Not hard at all  Food Insecurity: No Food Insecurity (12/24/2021)   Hunger Vital Sign    Worried About Running Out of  Food in the Last Year: Never true    Ran Out of Food in the Last Year: Never true  Transportation Needs: No Transportation Needs (12/24/2021)   PRAPARE - Hydrologist (Medical): No    Lack of Transportation (Non-Medical): No  Physical Activity: Sufficiently Active (12/24/2021)   Exercise Vital Sign    Days of Exercise per Week: 3 days    Minutes of Exercise  per Session: 60 min  Stress: No Stress Concern Present (12/24/2021)   [place]    Feeling of Stress : Not at all  Social Connections: Moderately Isolated (12/12/2020)   Social Connection and Isolation Panel [NHANES]    Frequency of Communication with Friends and Family: More than three times a week    Frequency of Social Gatherings with Friends and Family: More than three times a week    Attends Religious Services: 1 to 4 times per year    Active Member of [employer] or Organizations: No    Attends Archivist Meetings: Never    Marital Status: Divorced  Human resources officer Violence: Not At Risk (12/12/2020)   Humiliation, Afraid, Rape, and Kick questionnaire    Fear of Current or Ex-Partner: No    Emotionally Abused: No    Physically Abused: No    Sexually Abused: No    ROS:  insomnia and anxiety mild GERD all other symptoms reviewed and negative   Vitals:   08/21/22 1402  BP: (!) 142/86  Pulse: 60      Affect appropriate Healthy:  appears stated age 42: normal Neck supple with no adenopathy JVP normal no bruits no thyromegaly Lungs clear with no wheezing and good diaphragmatic motion Heart:  S1/S2 no murmur, no rub, gallop or click PMI normal post sternotomy  Abdomen: benighn, BS positve, no tenderness, no AAA no bruit.  No HSM or HJR Distal pulses intact with no bruits No edema Neuro non-focal Skin warm and dry No muscular weakness   Wt Readings from Last 3 Encounters:  08/21/22 199 lb (90.3 kg)  06/26/22 197 lb  (89.4 kg)  12/24/21 195 lb (88.5 kg)    Lab Results  Component Value Date   WBC 8.8 06/26/2022   HGB 15.8 06/26/2022   HCT 45.4 06/26/2022   PLT 273.0 06/26/2022   GLUCOSE 97 06/26/2022   CHOL 166 06/26/2022   TRIG 69.0 06/26/2022   HDL 42.30 06/26/2022   LDLCALC 110 (H) 06/26/2022   ALT 12 06/26/2022   AST 12 06/26/2022   NA 140 06/26/2022   K 4.6 06/26/2022   CL 102 06/26/2022   CREATININE 0.99 06/26/2022   BUN 11 06/26/2022   CO2 31 06/26/2022   TSH 1.69 06/26/2022   PSA 1.37 06/26/2022   INR 1.05 10/19/2015   HGBA1C 5.4 04/08/2019    EKG: Normal sinus rhythm, no acute change  09/29/14     Impression:  CAD/CABG: LIMA LAD/D1 patent cath 08/16/19 with no significant disease in left dominant circumflex and  Small non dominant RCA Continue medical Rx   ATrial Tachycardia: Sees [name] stable started on sotolol in hospital end of December 2020 and improved QT normal on Sotolol     HTN:  Well controlled.  Continue current medications and low sodium Dash type diet.     HLD:  Issues with myalgias on 40 mg Lipitor but tolerating now Discussed options of crestor and PSK9 will check labs February and determine course   Lab Results  Component Value Date   CHOL 166 06/26/2022   HDL 42.30 06/26/2022   LDLCALC 110 (H) 06/26/2022   TRIG 69.0 06/26/2022   CHOLHDL 4 06/26/2022    TTE next year Lipid and Liver 09/3032   F/U in 6 months    Jenkins Rouge

## 2022-08-21 ENCOUNTER — Encounter: Payer: Self-pay | Admitting: Cardiovascular Disease

## 2022-08-21 ENCOUNTER — Ambulatory Visit: Payer: Medicare Other | Attending: Cardiovascular Disease | Admitting: Cardiovascular Disease

## 2022-08-21 VITALS — BP 142/86 | HR 60 | Ht 71.0 in | Wt 199.0 lb

## 2022-08-21 DIAGNOSIS — I10 Essential (primary) hypertension: Secondary | ICD-10-CM | POA: Diagnosis present

## 2022-08-21 NOTE — Patient Instructions (Signed)
Medication Instructions:  Your physician recommends that you continue on your current medications as directed. Please refer to the Current Medication list given to you today.  *If you need a refill on your cardiac medications before your next appointment, please call your pharmacy*  Lab Work: Your physician recommends that you return for lab work in: Fasting Lipid and Liver Panel   If you have labs (blood work) drawn today and your tests are completely normal, you will receive your results only by: MyChart Message (if you have MyChart) OR A paper copy in the mail If you have any lab test that is abnormal or we need to change your treatment, we will call you to review the results.  Testing/Procedures: None ordered today.  Follow-Up: At [facility], you and your health needs are our priority.  As part of our continuing mission to provide you with exceptional heart care, we have created designated Provider Care Teams.  These Care Teams include your primary Cardiologist (physician) and Advanced Practice Providers (APPs -  Physician Assistants and Nurse Practitioners) who all work together to provide you with the care you need, when you need it.  We recommend signing up for the patient portal called "MyChart".  Sign up information is provided on this After Visit Summary.  MyChart is used to connect with patients for Virtual Visits (Telemedicine).  Patients are able to view lab/test results, encounter notes, upcoming appointments, etc.  Non-urgent messages can be sent to your provider as well.   To learn more about what you can do with MyChart, go to [email or web address].    Your next appointment:   6 month(s)  Provider:   Jenkins Rouge, MD

## 2022-08-21 NOTE — Addendum Note (Signed)
Addended by: Aris Georgia, Kella Splinter L on: 08/21/2022 02:34 PM   Modules accepted: Orders

## 2022-08-30 LAB — HEPATIC FUNCTION PANEL
ALT: 22 IU/L (ref 0–44)
AST: 18 IU/L (ref 0–40)
Albumin: 4.2 g/dL (ref 3.9–4.9)
Alkaline Phosphatase: 150 IU/L — ABNORMAL HIGH (ref 44–121)
Bilirubin Total: 0.8 mg/dL (ref 0.0–1.2)
Bilirubin, Direct: 0.24 mg/dL (ref 0.00–0.40)
Total Protein: 6.3 g/dL (ref 6.0–8.5)

## 2022-08-30 LAB — LIPID PANEL
Chol/HDL Ratio: 2.7 ratio (ref 0.0–5.0)
Cholesterol, Total: 105 mg/dL (ref 100–199)
HDL: 39 mg/dL — ABNORMAL LOW (ref >39)
LDL Chol Calc (NIH): 51 mg/dL (ref 0–99)
Triglycerides: 69 mg/dL (ref 0–149)
VLDL Cholesterol Cal: 15 mg/dL (ref 5–40)

## 2022-09-20 ENCOUNTER — Other Ambulatory Visit: Payer: Self-pay | Admitting: Adult Health

## 2022-12-19 ENCOUNTER — Other Ambulatory Visit: Payer: Self-pay

## 2022-12-19 ENCOUNTER — Other Ambulatory Visit: Payer: Self-pay | Admitting: Cardiovascular Disease

## 2022-12-19 ENCOUNTER — Other Ambulatory Visit: Payer: Self-pay | Admitting: Adult Health

## 2022-12-19 MED ORDER — SOTALOL HCL 120 MG PO TABS: 120.0000 mg | ORAL_TABLET | Freq: Two times a day (BID) | ORAL | 0 refills | Status: DC

## 2022-12-26 ENCOUNTER — Encounter: Payer: Self-pay | Admitting: Family Medicine

## 2022-12-26 ENCOUNTER — Ambulatory Visit (INDEPENDENT_AMBULATORY_CARE_PROVIDER_SITE_OTHER): Payer: Medicare Other | Admitting: Family Medicine

## 2022-12-26 VITALS — Wt 195.0 lb

## 2022-12-26 DIAGNOSIS — Z0000 Encounter for general adult medical examination without abnormal findings: Secondary | ICD-10-CM

## 2022-12-26 NOTE — Progress Notes (Signed)
PATIENT CHECK-IN and HEALTH RISK ASSESSMENT QUESTIONNAIRE:  -completed by phone/video for upcoming Medicare Preventive Visit  Pre-Visit Check-in: 1)Vitals (height, wt, BP, etc) - record in vitals section for visit on day of visit 2)Review and Update Medications, Allergies PMH, Surgeries, Social history in Epic 3)Hospitalizations in the last year with date/reason? No  4)Review and Update Care Team (patient's specialists) in Epic 5) Complete PHQ9 in Epic  6) Complete Fall Screening in Epic 7)Review all Health Maintenance Due and order under PCP if not done.  Medicare Wellness Patient Questionnaire:  Answer theses question about your habits: Do you drink alcohol? Yes If yes, how many drinks do you have a day? Rarely, a glass of wine. Monthly or less Have you ever smoked?No  How many packs a day do/did you smoke? N/a Do you use smokeless tobacco?no Do you use an illicit drugs?no Do you exercises? Yes IF so, what type and how many days/minutes per week?walking 3x per week about 30-45 minutes. Active in the yard always - takes care of his own yard.  Are you sexually active? no Typical breakfast: bagel, fruit, couple of eggs.  Typical lunch:  generally a sandwich of some kind- recently had [place], lettuce, tomatoes  Typical dinner:  chicken with some vegetable, once a while will do hamburger.  Typical snacks: on peanuts, potato chips. Once in while candy bar.  Beverages: tea, water  Answer theses question about you: Can you perform most household chores?no Do you find it hard to follow a conversation in a noisy room?no Do you often ask people to speak up or repeat themselves?no Do you feel that you have a problem with memory?"not so much" Do you balance your checkbook and or bank acounts?yes Do you feel safe at home?yes Last dentist visit? In last March, 2024 with Dr. Jillene Bucks in [place]. Do you need assistance with any of the following: Please note if so No  Driving?  Feeding  yourself?  Getting from bed to chair?  Getting to the toilet?  Bathing or showering?  Dressing yourself?  Managing money?  Climbing a flight of stairs  Preparing meals?    Do you have Advanced Directives in place (Living Will, Healthcare Power or Attorney)? Yes   Last eye Exam and location?Been a couple of years ago in [place].   Do you currently use prescribed or non-prescribed narcotic or opioid pain medications?No  Do you have a history or close family history of breast, ovarian, tubal or peritoneal cancer or a family member with BRCA (breast cancer susceptibility 1 and 2) gene mutations? No  Nurse/Assistant Credentials/time stamp:  Karpuih Moyun/CMA/12:33pm   ----------------------------------------------------------------------------------------------------------------------------------------------------------------------------------------------------------------------    MEDICARE ANNUAL PREVENTIVE CARE VISIT WITH PROVIDER (Welcome to Medicare, initial annual wellness or annual wellness exam)  Virtual Visit via Phone Note  I connected with Terry Hansen on 12/26/22  by phone and verified that I am speaking with the correct person using two identifiers.  Location patient: home Location provider:work or home office Persons participating in the virtual visit: patient, provider  Concerns and/or follow up today: no concerns, reports doing well - seeing cardiology for follow up on BP next month.    See HM section in Epic for other details of completed HM.    ROS: negative for report of fevers, unintentional weight loss, vision changes, vision loss, hearing loss or change, chest pain, sob, hemoptysis, melena, hematochezia, hematuria, falls, bleeding or bruising, thoughts of suicide or self harm, memory loss  Patient-completed extensive health risk assessment - reviewed  and discussed with the patient: See Health Risk Assessment completed with patient prior to the visit  either above or in recent phone note. This was reviewed in detailed with the patient today and appropriate recommendations, orders and referrals were placed as needed per Summary below and patient instructions.   Review of Medical History: -PMH, PSH, Family History and current specialty and care providers reviewed and updated and listed below   Patient Care Team: Shirline Frees, NP as PCP - General (Family Medicine) Wendall Stade, MD as PCP - Cardiology (Cardiology) Regan Lemming, MD as PCP - Electrophysiology (Cardiology) Daleen Squibb, Jesse Sans, MD (Inactive) as Attending Physician (Cardiology) Verner Chol, [facility] (Inactive) as Pharmacist (Pharmacist)   Past Medical History:  Diagnosis Date   CAD (coronary artery disease)    a.  LHC 06/26/12:  pLAD 90%, prox/mid/dist CFS 20-30%, oRCA 100% (non dom), EF 55%;  b. Echo 12/13:  EF 55-60%, Gr 1 diast dysfn;  c. s/p CABG Dorris Fetch) 12/13: L-LAD/Dx     GERD (gastroesophageal reflux disease)    HLD (hyperlipidemia)    Hypertension    Ocular migraine    "maybe once q couple months" (06/02/2014)   Unstable angina pectoris [facility])     Past Surgical History:  Procedure Laterality Date   APPENDECTOMY  1970's   CARDIAC CATHETERIZATION  06/2012   CORONARY ARTERY BYPASS GRAFT  06/29/2012   Procedure: CORONARY ARTERY BYPASS GRAFTING (CABG);  Surgeon: Loreli Slot, MD;  Location: [facility] OR;  Service: Open Heart Surgery;  Laterality: N/A;  times two using Left Internal Mammary Artery   INGUINAL HERNIA REPAIR Bilateral [phone]   LEFT HEART CATH AND CORS/GRAFTS ANGIOGRAPHY N/A 08/16/2019   Procedure: LEFT HEART CATH AND CORS/GRAFTS ANGIOGRAPHY;  Surgeon: Marykay Lex, MD;  Location: [facility] INVASIVE CV LAB;  Service: Cardiovascular;  Laterality: N/A;   LEFT HEART CATHETERIZATION WITH CORONARY ANGIOGRAM N/A 06/26/2012   Procedure: LEFT HEART CATHETERIZATION WITH CORONARY ANGIOGRAM;  Surgeon: Wendall Stade, MD;  Location: [facility] CATH LAB;  Service:  Cardiovascular;  Laterality: N/A;   LEFT HEART CATHETERIZATION WITH CORONARY/GRAFT ANGIOGRAM N/A 06/03/2014   Procedure: LEFT HEART CATHETERIZATION WITH Isabel Caprice;  Surgeon: Corky Crafts, MD;  Location: [facility] CATH LAB;  Service: Cardiovascular;  Laterality: N/A;   NASAL SEPTUM SURGERY     For uncontrolled epistaxis early 2013   SIGMOIDOSCOPY  10-15 years ago   in [place], [place]    Social History   Socioeconomic History   Marital status: Divorced    Spouse name: Not on file   Number of children: Not on file   Years of education: Not on file   Highest education level: Not on file  Occupational History   Occupation: retired  Tobacco Use   Smoking status: Never   Smokeless tobacco: Never  Vaping Use   Vaping Use: Never used  Substance and Sexual Activity   Alcohol use: Yes    Comment: occ-Social per pt   Drug use: No   Sexual activity: Not Currently  Other Topics Concern   Not on file  Social History Narrative   Divorced 7 years ago   3 children   Works in Careers adviser   Social Determinants of Health   Financial Resource Strain: Low Risk  (12/24/2021)   Overall Financial Resource Strain (CARDIA)    Difficulty of Paying Living Expenses: Not hard at all  Food Insecurity: No Food Insecurity (12/24/2021)   Hunger Vital Sign    Worried About  Running Out of Food in the Last Year: Never true    Ran Out of Food in the Last Year: Never true  Transportation Needs: No Transportation Needs (12/24/2021)   PRAPARE - Administrator, Civil Service (Medical): No    Lack of Transportation (Non-Medical): No  Physical Activity: Sufficiently Active (12/26/2022)   Exercise Vital Sign    Days of Exercise per Week: 3 days    Minutes of Exercise per Session: 50 min  Stress: No Stress Concern Present (12/26/2022)   [employer] of Occupational Health - Occupational Stress Questionnaire    Feeling of Stress : Only a little  Social Connections: Moderately  Integrated (12/26/2022)   Social Connection and Isolation Panel [NHANES]    Frequency of Communication with Friends and Family: Three times a week    Frequency of Social Gatherings with Friends and Family: Twice a week    Attends Religious Services: More than 4 times per year    Active Member of [employer] or Organizations: Yes    Attends Engineer, structural: More than 4 times per year    Marital Status: Divorced  Intimate Partner Violence: Not At Risk (12/12/2020)   Humiliation, Afraid, Rape, and Kick questionnaire    Fear of Current or Ex-Partner: No    Emotionally Abused: No    Physically Abused: No    Sexually Abused: No    Family History  Problem Relation Age of Onset   Heart disease Brother        CABG age 62, redo bypass ~50   Heart disease Paternal Grandmother    Heart disease Cousin        Maternal side   Heart attack Brother    Colon cancer Maternal Grandfather 60       METS to colon from Lung CA per pt   Lung cancer Maternal Grandfather    Colon polyps Neg Hx    Esophageal cancer Neg Hx    Rectal cancer Neg Hx    Stomach cancer Neg Hx     Current Outpatient Medications on File Prior to Visit  Medication Sig Dispense Refill   amLODipine (NORVASC) 2.5 MG tablet Take 1 tablet (2.5 mg total) by mouth daily. 90 tablet 2   atorvastatin (LIPITOR) 40 MG tablet TAKE 1 TABLET BY MOUTH EVERY NIGHT AT BEDTIME 90 tablet 0   Cholecalciferol (D3) 50 MCG (2000 UT) TABS Take by mouth.     diltiazem (CARDIZEM) 30 MG tablet Take 1 tablet (30 mg total) by mouth 4 (four) times daily as needed (elevated heart rates). 30 tablet 3   fluticasone (FLONASE) 50 MCG/ACT nasal spray Place 2 sprays into both nostrils daily. (Patient taking differently: Place 2 sprays into both nostrils as needed.) 16 g 6   ibuprofen (ADVIL,MOTRIN) 200 MG tablet Take 400-600 mg by mouth every 6 (six) hours as needed for headache or moderate pain.      isosorbide mononitrate (IMDUR) 30 MG 24 hr tablet Take 1  tablet (30 mg total) by mouth daily. 90 tablet 0   losartan (COZAAR) 100 MG tablet TAKE 1 TABLET BY MOUTH DAILY 90 tablet 0   nitroGLYCERIN (NITROSTAT) 0.4 MG SL tablet Place 1 tablet (0.4 mg total) under the tongue every 5 (five) minutes as needed for chest pain. 25 tablet 3   sotalol (BETAPACE) 120 MG tablet Take 1 tablet (120 mg total) by mouth every 12 (twelve) hours. 60 tablet 0   vitamin B-12 (CYANOCOBALAMIN) 1000 MCG tablet Take 1,000  mcg by mouth daily.     omeprazole (PRILOSEC OTC) 20 MG tablet Take 20 mg by mouth daily as needed (acid reflux).  (Patient not taking: Reported on 12/26/2022)     No current facility-administered medications on file prior to visit.    No Known Allergies     Physical Exam Vitals:   Estimated body mass index is 27.2 kg/m as calculated from the following:   Height as of 08/21/22: 5\' 11"  (1.803 m).   Weight as of this encounter: 195 lb (88.5 kg).  EKG (optional): deferred due to virtual visit  GENERAL: alert, oriented, no acute distress detected; full vision exam deferred due to pandemic and/or virtual encounter  PSYCH/NEURO: pleasant and cooperative, no obvious depression or anxiety, speech and thought processing grossly intact, Cognitive function grossly intact  Flowsheet Row Video Visit from 04/04/2020 in [facility]  PHQ-9 Total Score 0           12/26/2022   12:23 PM 12/24/2021    2:34 PM 07/18/2021    9:41 AM 12/12/2020   10:26 AM 09/01/2020    8:01 AM  Depression screen PHQ 2/9  Decreased Interest 0 0 0 0 0  Down, Depressed, Hopeless 0 0 0 0 0  PHQ - 2 Score 0 0 0 0 0       12/12/2020   10:28 AM 06/22/2021    1:25 PM 07/18/2021    9:41 AM 12/24/2021    2:34 PM 12/26/2022   12:24 PM  Fall Risk  Falls in the past year? 0 0 0 0 0  Was there an injury with Fall? 0  0 0 0  Fall Risk Category Calculator 0  0 0 0  Fall Risk Category (Retired) Low  Low Low   (RETIRED) Patient Fall Risk Level   Low fall risk     Patient at Risk for Falls Due to Impaired vision  No Fall Risks Medication side effect No Fall Risks  Fall risk Follow up Falls prevention discussed  Falls evaluation completed Falls evaluation completed;Education provided;Falls prevention discussed Falls evaluation completed     SUMMARY AND PLAN:  Encounter for Medicare annual wellness exam    Discussed applicable health maintenance/preventive health measures and advised and referred or ordered per patient preferences: -discussed recommendations for tetanus booster, pneumonia vaccines, covid boosters and shingles vaccines and where to get each  Health Maintenance  Topic Date Due   DTaP/Tdap/Td (1 - Tdap) Never done   Pneumonia Vaccine 68+ Years old (2 of 2 - PPSV23 or PCV20) 06/14/2020   COVID-19 Vaccine (1) 01/11/2023 (Originally 08/27/1960)   Zoster Vaccines- Shingrix (1 of 2) 03/28/2023 (Originally 08/27/1974)   INFLUENZA VACCINE  02/20/2023   Medicare Annual Wellness (AWV)  12/26/2023   Colonoscopy  03/04/2026   Hepatitis C Screening  Completed   HPV VACCINES  Aged [employer] and counseling on the following was provided based on the above review of health and a plan/checklist for the patient, along with additional information discussed, was provided for the patient in the patient instructions :   -Advised and counseled on a healthy lifestyle - including the importance of a healthy diet, regular physical activity, social connections and stress management. -Reviewed patient's current diet. Advised and counseled on a whole foods based healthy diet. A summary of a healthy diet was provided in the Patient Instructions.  -reviewed patient's current physical activity level and discussed exercise guidelines for adults. Discussed community resources and  ideas for safe exercise at home to assist in meeting exercise guideline recommendations in a safe and healthy way.  -Advise yearly dental visits at minimum and regular eye  exams -Advised and counseled on alcohol safe limits, risks Follow up: see patient instructions   Patient Instructions  I really enjoyed getting to talk with you today! I am available on Tuesdays and Thursdays for virtual visits if you have any questions or concerns, or if I can be of any further assistance.   CHECKLIST FROM ANNUAL WELLNESS VISIT:  -Follow up (please call to schedule if not scheduled after visit):   -yearly for annual wellness visit with primary care office  Here is a list of your preventive care/health maintenance measures and the plan for each if any are due:  PLAN For any measures below that may be due:   Health Maintenance  Topic Date Due   DTaP/Tdap/Td (1 - Tdap) Never done   Pneumonia Vaccine 36+ Years old (2 of 2 - PPSV23 or PCV20) 06/14/2020   COVID-19 Vaccine (1) due   Zoster Vaccines- Shingrix (1 of 2) due   INFLUENZA VACCINE  02/20/2023   Medicare Annual Wellness (AWV)  12/26/2023   Colonoscopy  03/04/2026   Hepatitis C Screening  Completed   HPV VACCINES  Aged Out    -See a dentist at least yearly  -Get your eyes checked and then per your eye specialist's recommendations  -Other issues addressed today:  -I have included below further information regarding a healthy whole foods based diet, physical activity guidelines for adults, stress management and opportunities for social connections. I hope you find this information useful.   -----------------------------------------------------------------------------------------------------------------------------------------------------------------------------------------------------------------------------------------------------------  NUTRITION: -eat real food: lots of colorful vegetables (half the plate) and fruits -5-7 servings of vegetables and fruits per day (fresh or steamed is best), exp. 2 servings of vegetables with lunch and dinner and 2 servings of fruit per day. Berries and greens such as kale  and collards are great choices.  -consume on a regular basis: whole grains (make sure first ingredient on label contains the word "whole"), fresh fruits, fish, nuts, seeds, healthy oils (such as olive oil, avocado oil, grape seed oil) -may eat small amounts of dairy and lean meat on occasion, but avoid processed meats such as ham, bacon, lunch meat, etc. -drink water -try to avoid fast food and pre-packaged foods, processed meat -most experts advise limiting sodium to < 2300mg  per day, should limit further is any chronic conditions such as high blood pressure, heart disease, diabetes, etc. The American Heart Association advised that < 1500mg  is is ideal -try to avoid foods that contain any ingredients with names you do not recognize  -try to avoid sugar/sweets (except for the natural sugar that occurs in fresh fruit) -try to avoid sweet drinks -try to avoid white rice, white bread, pasta (unless whole grain), white or yellow potatoes  EXERCISE GUIDELINES FOR ADULTS: -if you wish to increase your physical activity, do so gradually and with the approval of your doctor -STOP and seek medical care immediately if you have any chest pain, chest discomfort or trouble breathing when starting or increasing exercise  -move and stretch your body, legs, feet and arms when sitting for long periods -Physical activity guidelines for optimal health in adults: -least 150 minutes per week of aerobic exercise (can talk, but not sing) once approved by your doctor, 20-30 minutes of sustained activity or two 10 minute episodes of sustained activity every day.  -resistance training at least  2 days per week if approved by your doctor -balance exercises 3+ days per week:   Stand somewhere where you have something sturdy to hold onto if you lose balance.    1) lift up on toes, start with 5x per day and work up to 20x   2) stand and lift on leg straight out to the side so that foot is a few inches of the floor, start with  5x each side and work up to 20x each side   3) stand on one foot, start with 5 seconds each side and work up to 20 seconds on each side  If you need ideas or help with getting more active:  -Silver sneakers https://tools.silversneakers.com  -Walk with a Doc: http://www.duncan-williams.com/  -try to include resistance (weight lifting/strength building) and balance exercises twice per week: or the following link for ideas: http://castillo-powell.com/  BuyDucts.dk  STRESS MANAGEMENT: -can try meditating, or just sitting quietly with deep breathing while intentionally relaxing all parts of your body for 5 minutes daily -if you need further help with stress, anxiety or depression please follow up with your primary doctor or contact the wonderful folks at [facility]: [phone]  SOCIAL CONNECTIONS: -options in [place] if you wish to engage in more social and exercise related activities:  -Silver sneakers https://tools.silversneakers.com  -Walk with a Doc: http://www.duncan-williams.com/  -Check out the [facility] Active Adults 50+ section on the [place] of [employer] (hiking clubs, book clubs, cards and games, chess, exercise classes, aquatic classes and much more) - see the website for details: https://www.[place].gov/departments/parks-recreation/active-adults50  -YouTube has lots of exercise videos for different ages and abilities as well  -[facility] (a variety of indoor and outdoor inperson activities for adults). [phone]. [address]  -Virtual Online Classes (a variety of topics): see seniorplanet.org or call [phone]  -consider volunteering at a school, hospice center, church, senior center or elsewhere           Terressa Koyanagi, DO

## 2022-12-26 NOTE — Patient Instructions (Signed)
I really enjoyed getting to talk with you today! I am available on Tuesdays and Thursdays for virtual visits if you have any questions or concerns, or if I can be of any further assistance.   CHECKLIST FROM ANNUAL WELLNESS VISIT:  -Follow up (please call to schedule if not scheduled after visit):   -yearly for annual wellness visit with primary care office  Here is a list of your preventive care/health maintenance measures and the plan for each if any are due:  PLAN For any measures below that may be due:   Health Maintenance  Topic Date Due   DTaP/Tdap/Td (1 - Tdap) Never done   Pneumonia Vaccine 27+ Years old (2 of 2 - PPSV23 or PCV20) 06/14/2020   COVID-19 Vaccine (1) due   Zoster Vaccines- Shingrix (1 of 2) due   INFLUENZA VACCINE  02/20/2023   Medicare Annual Wellness (AWV)  12/26/2023   Colonoscopy  03/04/2026   Hepatitis C Screening  Completed   HPV VACCINES  Aged Out    -See a dentist at least yearly  -Get your eyes checked and then per your eye specialist's recommendations  -Other issues addressed today:  -I have included below further information regarding a healthy whole foods based diet, physical activity guidelines for adults, stress management and opportunities for social connections. I hope you find this information useful.   -----------------------------------------------------------------------------------------------------------------------------------------------------------------------------------------------------------------------------------------------------------  NUTRITION: -eat real food: lots of colorful vegetables (half the plate) and fruits -5-7 servings of vegetables and fruits per day (fresh or steamed is best), exp. 2 servings of vegetables with lunch and dinner and 2 servings of fruit per day. Berries and greens such as kale and collards are great choices.  -consume on a regular basis: whole grains (make sure first ingredient on label contains  the word "whole"), fresh fruits, fish, nuts, seeds, healthy oils (such as olive oil, avocado oil, grape seed oil) -may eat small amounts of dairy and lean meat on occasion, but avoid processed meats such as ham, bacon, lunch meat, etc. -drink water -try to avoid fast food and pre-packaged foods, processed meat -most experts advise limiting sodium to < 2300mg  per day, should limit further is any chronic conditions such as high blood pressure, heart disease, diabetes, etc. The American Heart Association advised that < 1500mg  is is ideal -try to avoid foods that contain any ingredients with names you do not recognize  -try to avoid sugar/sweets (except for the natural sugar that occurs in fresh fruit) -try to avoid sweet drinks -try to avoid white rice, white bread, pasta (unless whole grain), white or yellow potatoes  EXERCISE GUIDELINES FOR ADULTS: -if you wish to increase your physical activity, do so gradually and with the approval of your doctor -STOP and seek medical care immediately if you have any chest pain, chest discomfort or trouble breathing when starting or increasing exercise  -move and stretch your body, legs, feet and arms when sitting for long periods -Physical activity guidelines for optimal health in adults: -least 150 minutes per week of aerobic exercise (can talk, but not sing) once approved by your doctor, 20-30 minutes of sustained activity or two 10 minute episodes of sustained activity every day.  -resistance training at least 2 days per week if approved by your doctor -balance exercises 3+ days per week:   Stand somewhere where you have something sturdy to hold onto if you lose balance.    1) lift up on toes, start with 5x per day and work up to 20x  2) stand and lift on leg straight out to the side so that foot is a few inches of the floor, start with 5x each side and work up to 20x each side   3) stand on one foot, start with 5 seconds each side and work up to 20  seconds on each side  If you need ideas or help with getting more active:  -Silver sneakers https://tools.silversneakers.com  -Walk with a Doc: http://www.duncan-williams.com/  -try to include resistance (weight lifting/strength building) and balance exercises twice per week: or the following link for ideas: http://castillo-powell.com/  BuyDucts.dk  STRESS MANAGEMENT: -can try meditating, or just sitting quietly with deep breathing while intentionally relaxing all parts of your body for 5 minutes daily -if you need further help with stress, anxiety or depression please follow up with your primary doctor or contact the wonderful folks at [facility]: [phone]  SOCIAL CONNECTIONS: -options in [place] if you wish to engage in more social and exercise related activities:  -Silver sneakers https://tools.silversneakers.com  -Walk with a Doc: http://www.duncan-williams.com/  -Check out the [facility] Active Adults 50+ section on the [place] of [employer] (hiking clubs, book clubs, cards and games, chess, exercise classes, aquatic classes and much more) - see the website for details: https://www.[place].gov/departments/parks-recreation/active-adults50  -YouTube has lots of exercise videos for different ages and abilities as well  -[facility] (a variety of indoor and outdoor inperson activities for adults). ([phone]. [address].  -Virtual Online Classes (a variety of topics): see seniorplanet.org or call [phone]  -consider volunteering at a school, hospice center, church, senior center or elsewhere

## 2023-01-20 ENCOUNTER — Other Ambulatory Visit: Payer: Self-pay

## 2023-01-20 MED ORDER — SOTALOL HCL 120 MG PO TABS: 120.0000 mg | ORAL_TABLET | Freq: Two times a day (BID) | ORAL | 0 refills | Status: DC

## 2023-01-28 ENCOUNTER — Other Ambulatory Visit: Payer: Self-pay

## 2023-01-28 MED ORDER — SOTALOL HCL 120 MG PO TABS: 120.0000 mg | ORAL_TABLET | Freq: Two times a day (BID) | ORAL | 1 refills | Status: DC

## 2023-01-28 NOTE — Telephone Encounter (Signed)
Pt's medication was sent to pt's pharmacy as requested. Confirmation received.  °

## 2023-01-30 ENCOUNTER — Telehealth: Payer: Self-pay | Admitting: Adult Health

## 2023-01-30 NOTE — Telephone Encounter (Signed)
Pt called and would like to get back on Citalopram  Please advise.

## 2023-01-30 NOTE — Telephone Encounter (Signed)
Pt wanting to start medication again. Pt has been scheduled.

## 2023-02-04 ENCOUNTER — Telehealth: Payer: Medicare Other | Admitting: Adult Health

## 2023-02-04 ENCOUNTER — Encounter: Payer: Self-pay | Admitting: Adult Health

## 2023-02-04 DIAGNOSIS — J029 Acute pharyngitis, unspecified: Secondary | ICD-10-CM | POA: Diagnosis not present

## 2023-02-04 DIAGNOSIS — F419 Anxiety disorder, unspecified: Secondary | ICD-10-CM

## 2023-02-04 DIAGNOSIS — F32A Depression, unspecified: Secondary | ICD-10-CM

## 2023-02-04 MED ORDER — METHYLPREDNISOLONE 4 MG PO TBPK
ORAL_TABLET | ORAL | 0 refills | Status: DC
Start: 2023-02-04 — End: 2023-02-18

## 2023-02-04 MED ORDER — CITALOPRAM HYDROBROMIDE 10 MG PO TABS
10.0000 mg | ORAL_TABLET | Freq: Every day | ORAL | 1 refills | Status: DC
Start: 2023-02-04 — End: 2023-07-28

## 2023-02-04 NOTE — Progress Notes (Signed)
Virtual Visit via Video Note  I connected with Terry Hansen on 02/04/23 at  5:15 PM EDT by a video enabled telemedicine application and verified that I am speaking with the correct person using two identifiers.  Location patient: home Location provider:work or home office Persons participating in the virtual visit: patient, provider  I discussed the limitations of evaluation and management by telemedicine and the availability of in person appointments. The patient expressed understanding and agreed to proceed.   HPI:  67 year old male who  has a past medical history of CAD (coronary artery disease), GERD (gastroesophageal reflux disease), HLD (hyperlipidemia), Hypertension, Ocular migraine, and Unstable angina pectoris (HCC).  He is being evaluated today for multiple issues.   Acutely, he reports that over the last 3 weeks he has not been feeling well.  States that his symptoms started with rhinorrhea which has since resolved.  Over the last week he has had a sore throat.  He did go to urgent care and had a negative COVID and strep test, was prescribed amoxicillin x 10 days, he is on day 5.  He has now started to have pain in towards his left ear.  Furthermore he has had worsening anxiety and depression.  This is stemming from family issues where he feels as though he has been cut off from the family.  [place] and now wishes he died from move there and instead stayed in [place].  In the past he was on Celexa 10 mg and did well with this medication.  He would like to restart it  ROS: See pertinent positives and negatives per HPI.  Past Medical History:  Diagnosis Date   CAD (coronary artery disease)    a.  LHC 06/26/12:  pLAD 90%, prox/mid/dist CFS 20-30%, oRCA 100% (non dom), EF 55%;  b. Echo 12/13:  EF 55-60%, Gr 1 diast dysfn;  c. s/p CABG [facility]) 12/13: L-LAD/Dx     GERD (gastroesophageal reflux disease)    HLD  (hyperlipidemia)    Hypertension    Ocular migraine    "maybe once q couple months" (06/02/2014)   Unstable angina pectoris [facility])     Past Surgical History:  Procedure Laterality Date   APPENDECTOMY  1970's   CARDIAC CATHETERIZATION  06/2012   CORONARY ARTERY BYPASS GRAFT  06/29/2012   Procedure: CORONARY ARTERY BYPASS GRAFTING (CABG);  Surgeon: Loreli Slot, MD;  Location: [facility] OR;  Service: Open Heart Surgery;  Laterality: N/A;  times two using Left Internal Mammary Artery   INGUINAL HERNIA REPAIR Bilateral [phone]   LEFT HEART CATH AND CORS/GRAFTS ANGIOGRAPHY N/A 08/16/2019   Procedure: LEFT HEART CATH AND CORS/GRAFTS ANGIOGRAPHY;  Surgeon: Marykay Lex, MD;  Location: [facility] INVASIVE CV LAB;  Service: Cardiovascular;  Laterality: N/A;   LEFT HEART CATHETERIZATION WITH CORONARY ANGIOGRAM N/A 06/26/2012   Procedure: LEFT HEART CATHETERIZATION WITH CORONARY ANGIOGRAM;  Surgeon: Wendall Stade, MD;  Location: [facility] CATH LAB;  Service: Cardiovascular;  Laterality: N/A;   LEFT HEART CATHETERIZATION WITH CORONARY/GRAFT ANGIOGRAM N/A 06/03/2014   Procedure: LEFT HEART CATHETERIZATION WITH Isabel Caprice;  Surgeon: Corky Crafts, MD;  Location: [facility] CATH LAB;  Service: Cardiovascular;  Laterality: N/A;   NASAL SEPTUM SURGERY     For uncontrolled epistaxis early 2013   SIGMOIDOSCOPY  10-15 years ago   in [place], [place]    Family History  Problem Relation Age of Onset   Heart disease  Brother        CABG age 1, redo bypass ~50   Heart disease Paternal Grandmother    Heart disease Cousin        Maternal side   Heart attack Brother    Colon cancer Maternal Grandfather 60       METS to colon from Lung CA per pt   Lung cancer Maternal Grandfather    Colon polyps Neg Hx    Esophageal cancer Neg Hx    Rectal cancer Neg Hx    Stomach cancer Neg Hx        Current Outpatient Medications:    amLODipine (NORVASC) 2.5 MG tablet, Take 1 tablet (2.5 mg total) by mouth  daily., Disp: 90 tablet, Rfl: 2   atorvastatin (LIPITOR) 40 MG tablet, TAKE 1 TABLET BY MOUTH EVERY NIGHT AT BEDTIME, Disp: 90 tablet, Rfl: 0   Cholecalciferol (D3) 50 MCG (2000 UT) TABS, Take by mouth., Disp: , Rfl:    diltiazem (CARDIZEM) 30 MG tablet, Take 1 tablet (30 mg total) by mouth 4 (four) times daily as needed (elevated heart rates)., Disp: 30 tablet, Rfl: 3   fluticasone (FLONASE) 50 MCG/ACT nasal spray, Place 2 sprays into both nostrils daily. (Patient taking differently: Place 2 sprays into both nostrils as needed.), Disp: 16 g, Rfl: 6   ibuprofen (ADVIL,MOTRIN) 200 MG tablet, Take 400-600 mg by mouth every 6 (six) hours as needed for headache or moderate pain. , Disp: , Rfl:    isosorbide mononitrate (IMDUR) 30 MG 24 hr tablet, Take 1 tablet (30 mg total) by mouth daily., Disp: 90 tablet, Rfl: 0   losartan (COZAAR) 100 MG tablet, TAKE 1 TABLET BY MOUTH DAILY, Disp: 90 tablet, Rfl: 0   nitroGLYCERIN (NITROSTAT) 0.4 MG SL tablet, Place 1 tablet (0.4 mg total) under the tongue every 5 (five) minutes as needed for chest pain., Disp: 25 tablet, Rfl: 3   omeprazole (PRILOSEC OTC) 20 MG tablet, Take 20 mg by mouth daily as needed (acid reflux).  (Patient not taking: Reported on 12/26/2022), Disp: , Rfl:    sotalol (BETAPACE) 120 MG tablet, Take 1 tablet (120 mg total) by mouth every 12 (twelve) hours., Disp: 60 tablet, Rfl: 1   vitamin B-12 (CYANOCOBALAMIN) 1000 MCG tablet, Take 1,000 mcg by mouth daily., Disp: , Rfl:   EXAM:  VITALS per patient if applicable:  GENERAL: alert, oriented, appears well and in no acute distress  HEENT: atraumatic, conjunttiva clear, no obvious abnormalities on inspection of external nose and ears  NECK: normal movements of the head and neck  LUNGS: on inspection no signs of respiratory distress, breathing rate appears normal, no obvious gross SOB, gasping or wheezing  CV: no obvious cyanosis  MS: moves all visible extremities without noticeable  abnormality  PSYCH/NEURO: pleasant and cooperative, no obvious depression or anxiety, speech and thought processing grossly intact  ASSESSMENT AND PLAN:  Discussed the following assessment and plan:  1. Sore throat - Will send in Medrol Dose Pack  - methylPREDNISolone (MEDROL DOSEPAK) 4 MG TBPK tablet; Take as directed  Dispense: 21 tablet; Refill: 0  2. Anxiety and depression - Restart on Celexa - citalopram (CELEXA) 10 MG tablet; Take 1 tablet (10 mg total) by mouth daily.  Dispense: 90 tablet; Refill: 1  He is going to be in town in two weeks and will make an appointment to come in for reevaluation     I discussed the assessment and treatment plan with the patient. The patient was  provided an opportunity to ask questions and all were answered. The patient agreed with the plan and demonstrated an understanding of the instructions.   The patient was advised to call back or seek an in-person evaluation if the symptoms worsen or if the condition fails to improve as anticipated.   Shirline Frees, NP

## 2023-02-05 ENCOUNTER — Telehealth: Payer: Self-pay | Admitting: Adult Health

## 2023-02-05 NOTE — Telephone Encounter (Signed)
Spoke to pt. This has been taken care of. Pt has been taking both of these medications for years.  He was off the Celexa for awhile, but needed to get restarted on it, which is what prompted this concern about interaction. He appreciates my call.

## 2023-02-05 NOTE — Telephone Encounter (Signed)
Spoke to pt and he advised that he took care of this concern this morning with his cardiologist and pharmacist. Please advise

## 2023-02-05 NOTE — Telephone Encounter (Signed)
HARRIS TEETER called to say there may be a POSSIBLE DRUG INTERACTION between the following:  citalopram (CELEXA) 10 MG tablet  sotalol (BETAPACE) 120 MG tablet  Is Pt still taking both medications?  Is Pt being monitored?  Please advise.

## 2023-02-18 ENCOUNTER — Ambulatory Visit: Payer: Medicare Other | Attending: Physician Assistant | Admitting: Physician Assistant

## 2023-02-18 ENCOUNTER — Encounter: Payer: Self-pay | Admitting: Physician Assistant

## 2023-02-18 VITALS — BP 144/80 | HR 60 | Ht 71.0 in | Wt 195.8 lb

## 2023-02-18 DIAGNOSIS — E785 Hyperlipidemia, unspecified: Secondary | ICD-10-CM | POA: Diagnosis not present

## 2023-02-18 DIAGNOSIS — I2510 Atherosclerotic heart disease of native coronary artery without angina pectoris: Secondary | ICD-10-CM

## 2023-02-18 DIAGNOSIS — I4719 Other supraventricular tachycardia: Secondary | ICD-10-CM | POA: Diagnosis not present

## 2023-02-18 DIAGNOSIS — I10 Essential (primary) hypertension: Secondary | ICD-10-CM

## 2023-02-18 DIAGNOSIS — I350 Nonrheumatic aortic (valve) stenosis: Secondary | ICD-10-CM | POA: Diagnosis present

## 2023-02-18 MED ORDER — VALSARTAN 160 MG PO TABS: 160.0000 mg | ORAL_TABLET | Freq: Every day | ORAL | 1 refills | Status: DC

## 2023-02-18 NOTE — Patient Instructions (Addendum)
Medication Instructions:  STOP Losartan  START Valsartan 160mg  Take 1 tablet once a day  *If you need a refill on your cardiac medications before your next appointment, please call your pharmacy*   Lab Work: 1-2 weeks have [facility] near you If you have labs (blood work) drawn today and your tests are completely normal, you will receive your results only by: MyChart Message (if you have MyChart) OR A paper copy in the mail If you have any lab test that is abnormal or we need to change your treatment, we will call you to review the results.   Testing/Procedures: Your physician has requested that you have an echocardiogram. Echocardiography is a painless test that uses sound waves to create images of your heart. It provides your doctor with information about the size and shape of your heart and how well your heart's chambers and valves are working. This procedure takes approximately one hour. There are no restrictions for this procedure. Please do NOT wear cologne, perfume, aftershave, or lotions (deodorant is allowed). Please arrive 15 minutes prior to your appointment time. Please try to schedule the echo on the same day as appt with Francis Dowse, PA   Follow-Up: At [facility], you and your health needs are our priority.  As part of our continuing mission to provide you with exceptional heart care, we have created designated Provider Care Teams.  These Care Teams include your primary Cardiologist (physician) and Advanced Practice Providers (APPs -  Physician Assistants and Nurse Practitioners) who all work together to provide you with the care you need, when you need it.  We recommend signing up for the patient portal called "MyChart".  Sign up information is provided on this After Visit Summary.  MyChart is used to connect with patients for Virtual Visits (Telemedicine).  Patients are able to view lab/test results, encounter notes, upcoming appointments, etc.   Non-urgent messages can be sent to your provider as well.   To learn more about what you can do with MyChart, go to [email or web address].    Your next appointment:   6 month(s)  Provider:   Charlton Haws, MD    Keep follow up with EP Provider Francis Dowse, PA  Other Instructions  Check your blood pressure daily for 2 weeks, then contact the office with your readings.  Make sure to check 2 hours after your medications.   AVOID these things for 30 minutes before checking your blood pressure: No Drinking caffeine. No Drinking alcohol. No Eating. No Smoking. No Exercising.  Five minutes before checking your blood pressure: Pee. Sit in a dining chair. Avoid sitting in a soft couch or armchair. Be quiet. Do not talk.

## 2023-02-18 NOTE — Progress Notes (Signed)
Cardiology Office Note:  .   Date:  02/18/2023  ID:  Terry Hansen, DOB 07/06/1956, MRN [id number] PCP: Shirline Frees, NP  [facility] Providers Cardiologist:  Charlton Haws, MD Electrophysiologist:  Will Jorja Loa, MD {  History of Present Illness: .   Terry Hansen is a 67 y.o. male with a past medical history of CABG in 2013 (LIMA to LAD/diagonal with patent cath 06/03/2014), CRF, hypertension, hyperlipidemia, atrial tachycardia (sees Dr. Elberta Fortis and on Cardizem and sotalol), fatigue with beta-blockers here for follow-up appointment.  Last Myoview 08/10/2019 with apical infarct with mild peri-infarct ischemia on Imdur.  Today cath 08/16/2019 with patent LIMA to LAD/diagonal 1 with no intervention needed.  Retired from Therapist, sports at [employer].  Has a son in [place] and a brother in [place].  Brother in [place] has a bad heart with previous CABG and recent TAVR.  He was discharged from hospital on 07/22/2019 with chest pain and palpitations.  Had runs of atrial tachycardia on sotalol started, Cardizem DC'd due to bradycardia.  Myoview done 08/10/2019 with LVEF 50%, medium size anterior infarct with peri-infarct ischemia.  Cardiac cath 08/16/2019, no targets for PCI, patent LIMA to diagonal 1/LAD large left dominant circumflex and nondominant RCA with out significant disease.  Seen by Dr. Elberta Fortis March 2021 with stable arrhythmia on sotalol.  Start statin July for myalgias, LDL up to 110 (06/26/2022) was on Lipitor 40 mg daily.  Tolerating after holiday.  Last seen January 2024 and at that time was doing well.  Plan for follow-up labs and echo.  Today, he tells me he has been dealing with anxiety and panic attacks.  Recently started on Celexa but is only for about 2 weeks.  Has noticed it has improved but now having frequent anxiety attacks.  During his attacks (which usually last 2 hours or so).  Blood pressure is elevated around 180/100.  Overall, blood pressure when  it not having a panic attack is 142/87.  Still above goal.  Recently, his amlodipine was reduced due to lower extremity swelling.  He is only taking 2.5 mg amlodipine at the moment.  We discussed several options to help control his blood pressure but ultimately decided to change his losartan to valsartan and continue to monitor closely.  His atrial tachycardia is better controlled on his current medication regimen of Cardizem 30 mg as needed, Betapace 120 mg twice daily, and Imdur 30 mg daily.  Reports no shortness of breath nor dyspnea on exertion. Reports no chest pain, pressure, or tightness. No edema, orthopnea, PND.    ROS: Pertinent ROS in HPI  Studies Reviewed: .       Cardiac catheterization 08/16/2019 Left Anterior Descending  Prox LAD to Mid LAD lesion is 85% stenosed with 100% stenosed side branch in 1st Diag. The lesion is located at the bifurcation, discrete and irregular. The lesion is calcified.    First Septal Branch  1st Sept lesion is 80% stenosed. The lesion is located at the bifurcation.    Second Diagonal Branch  Vessel is small in size.    Second Septal Branch  Vessel is small in size.    Third Diagonal Branch  Vessel is small in size.    Third Septal Branch  Vessel is small in size.    Left Circumflex  Vessel is large.    Third Left Posterolateral Branch  Vessel is small in size.    Left Posterior Atrioventricular Artery  Vessel is large in size.  Right Coronary Artery  Vessel is small.    Acute Marginal Branch  Vessel is small in size.    Sequential LIMA LIMA Graft To 1st Diag, Mid LAD  LIMA graft was visualized by angiography and is large. The graft exhibits no disease. There is competitive flow. Sequential LIMA-D1-LAD    Intervention   No interventions have been documented.   Wall Motion  Resting                 EKG with stable incomplete LBBB (present on 2020 EKG)      Physical Exam:   VS:  BP (!) 144/80   Pulse 60   Ht 5'  11" (1.803 m)   Wt 195 lb 12.8 oz (88.8 kg)   SpO2 96%   BMI 27.31 kg/m    Wt Readings from Last 3 Encounters:  02/18/23 195 lb 12.8 oz (88.8 kg)  12/26/22 195 lb (88.5 kg)  08/21/22 199 lb (90.3 kg)    GEN: Well nourished, well developed in no acute distress NECK: No JVD; No carotid bruits CARDIAC: RRR, + 3/6 systolic murmur, rubs, gallops RESPIRATORY:  Clear to auscultation without rales, wheezing or rhonchi  ABDOMEN: Soft, non-tender, non-distended EXTREMITIES:  1+ bilaterally edema; No deformity   ASSESSMENT AND PLAN: .   1.  CAD/status post CABG -No chest pains or shortness of breath outside of panic attacks -Continue current medication regimen which includes amlodipine 2.5 mg daily, Lipitor 40 mg daily, diltiazem 30 mg as needed, and 30 mg daily, valsartan 160 mg daily, Betapace 120 mg   2.  Atrial tachycardia -cardizem, sotalol control symptoms -flutters once in a while but nothing long lasting  3.  HTN -Changing losartan 100 mg daily to valsartan 160 mg daily -Plan to follow-up with blood pressure check in 2 weeks when he is here syncope -Encouraged him to keep track of his blood pressure at home an hour or 2 after morning medications and write -follow up in 1-2 weeks BMP, in [place] city  4.  HLD -Last lipid panel with LDL 51, HDL 39, total Chol 105, triglycerides 69 -Now, continue Lipitor 40 mg daily -Due for follow-up this winter with PCP  5. Anxiety  -Recently started on Celexa 10 mg daily -He has not been with his PCP to discuss short acting antianxiety medication to take in the moment when having a panic attack  6. Systolic murmur -Last echocardiogram reviewed with history of bicuspid aortic valve, aortic calcification, and mild aortic stenosis -Plan to update echocardiogram -8/15 for echo would be ideal since he will be here in [place] (lives in [place] city)       Dispo: Follow-up in 2 weeks with EP, 6 weeks with Dr. Eden Emms  Signed, Sharlene Dory, PA-C

## 2023-02-19 ENCOUNTER — Ambulatory Visit (INDEPENDENT_AMBULATORY_CARE_PROVIDER_SITE_OTHER): Payer: Medicare Other | Admitting: Adult Health

## 2023-02-19 ENCOUNTER — Encounter: Payer: Self-pay | Admitting: Adult Health

## 2023-02-19 VITALS — BP 130/80 | HR 57 | Temp 98.1°F | Ht 71.0 in | Wt 195.0 lb

## 2023-02-19 DIAGNOSIS — F419 Anxiety disorder, unspecified: Secondary | ICD-10-CM

## 2023-02-19 DIAGNOSIS — F32A Depression, unspecified: Secondary | ICD-10-CM | POA: Diagnosis not present

## 2023-02-19 NOTE — Progress Notes (Signed)
Subjective:    Patient ID: Terry Hansen, male    DOB: [birth year], 67 y.o.   MRN: [id number]  HPI  67 year old male who  has a past medical history of CAD (coronary artery disease), GERD (gastroesophageal reflux disease), HLD (hyperlipidemia), Hypertension, Ocular migraine, and Unstable angina pectoris (HCC).  He presents to the office today for follow-up regarding anxiety and depression.  We had a virtual visit roughly 2 weeks ago at which time he expressed that he had increased anxiety and depression that was stemming from family issues.  On the past he was on Celexa 10 mg and did very well with this medication.  We placed him back on Celexa 10 mg, he reports that since starting this medication 2 weeks ago he has noticed improvement in his mood regarding depression, continues to still have anxiety and panic attacks but these have become less frequent.  He is tolerating the medication well without any GI effects.   Review of Systems See HPI   Past Medical History:  Diagnosis Date   CAD (coronary artery disease)    a.  LHC 06/26/12:  pLAD 90%, prox/mid/dist CFS 20-30%, oRCA 100% (non dom), EF 55%;  b. Echo 12/13:  EF 55-60%, Gr 1 diast dysfn;  c. s/p CABG Dorris Fetch) 12/13: L-LAD/Dx     GERD (gastroesophageal reflux disease)    HLD (hyperlipidemia)    Hypertension    Ocular migraine    "maybe once q couple months" (06/02/2014)   Unstable angina pectoris (HCC)     Social History   Socioeconomic History   Marital status: Divorced    Spouse name: Not on file   Number of children: Not on file   Years of education: Not on file   Highest education level: Not on file  Occupational History   Occupation: retired  Tobacco Use   Smoking status: Never   Smokeless tobacco: Never  Vaping Use   Vaping status: Never Used  Substance and Sexual Activity   Alcohol use: Yes    Comment: occ-Social per pt   Drug use: No   Sexual activity: Not Currently  Other Topics Concern   Not on file   Social History Narrative   Divorced 7 years ago   3 children   Works in Careers adviser   Social Determinants of Health   Financial Resource Strain: Low Risk  (12/24/2021)   Overall Financial Resource Strain (CARDIA)    Difficulty of Paying Living Expenses: Not hard at all  Food Insecurity: No Food Insecurity (12/24/2021)   Hunger Vital Sign    Worried About Running Out of Food in the Last Year: Never true    Ran Out of Food in the Last Year: Never true  Transportation Needs: No Transportation Needs (12/24/2021)   PRAPARE - Administrator, Civil Service (Medical): No    Lack of Transportation (Non-Medical): No  Physical Activity: Sufficiently Active (12/26/2022)   Exercise Vital Sign    Days of Exercise per Week: 3 days    Minutes of Exercise per Session: 50 min  Stress: No Stress Concern Present (12/26/2022)   [employer] of Occupational Health - Occupational Stress Questionnaire    Feeling of Stress : Only a little  Social Connections: Moderately Integrated (12/26/2022)   Social Connection and Isolation Panel [NHANES]    Frequency of Communication with Friends and Family: Three times a week    Frequency of Social Gatherings with Friends and Family: Twice a week  Attends Religious Services: More than 4 times per year    Active Member of Clubs or Organizations: Yes    Attends Banker Meetings: More than 4 times per year    Marital Status: Divorced  Intimate Partner Violence: Not At Risk (12/12/2020)   Humiliation, Afraid, Rape, and Kick questionnaire    Fear of Current or Ex-Partner: No    Emotionally Abused: No    Physically Abused: No    Sexually Abused: No    Past Surgical History:  Procedure Laterality Date   APPENDECTOMY  1970's   CARDIAC CATHETERIZATION  06/2012   CORONARY ARTERY BYPASS GRAFT  06/29/2012   Procedure: CORONARY ARTERY BYPASS GRAFTING (CABG);  Surgeon: Loreli Slot, MD;  Location: [facility] OR;  Service: Open Heart Surgery;   Laterality: N/A;  times two using Left Internal Mammary Artery   INGUINAL HERNIA REPAIR Bilateral [phone]   LEFT HEART CATH AND CORS/GRAFTS ANGIOGRAPHY N/A 08/16/2019   Procedure: LEFT HEART CATH AND CORS/GRAFTS ANGIOGRAPHY;  Surgeon: Marykay Lex, MD;  Location: [facility] INVASIVE CV LAB;  Service: Cardiovascular;  Laterality: N/A;   LEFT HEART CATHETERIZATION WITH CORONARY ANGIOGRAM N/A 06/26/2012   Procedure: LEFT HEART CATHETERIZATION WITH CORONARY ANGIOGRAM;  Surgeon: Wendall Stade, MD;  Location: [facility] CATH LAB;  Service: Cardiovascular;  Laterality: N/A;   LEFT HEART CATHETERIZATION WITH CORONARY/GRAFT ANGIOGRAM N/A 06/03/2014   Procedure: LEFT HEART CATHETERIZATION WITH Isabel Caprice;  Surgeon: Corky Crafts, MD;  Location: [facility] CATH LAB;  Service: Cardiovascular;  Laterality: N/A;   NASAL SEPTUM SURGERY     For uncontrolled epistaxis early 2013   SIGMOIDOSCOPY  10-15 years ago   in [place], [place]    Family History  Problem Relation Age of Onset   Heart disease Brother        CABG age 66, redo bypass ~50   Heart disease Paternal Grandmother    Heart disease Cousin        Maternal side   Heart attack Brother    Colon cancer Maternal Grandfather 60       METS to colon from Lung CA per pt   Lung cancer Maternal Grandfather    Colon polyps Neg Hx    Esophageal cancer Neg Hx    Rectal cancer Neg Hx    Stomach cancer Neg Hx     No Known Allergies  Current Outpatient Medications on File Prior to Visit  Medication Sig Dispense Refill   amLODipine (NORVASC) 2.5 MG tablet Take 1 tablet (2.5 mg total) by mouth daily. 90 tablet 2   atorvastatin (LIPITOR) 40 MG tablet TAKE 1 TABLET BY MOUTH EVERY NIGHT AT BEDTIME 90 tablet 0   Cholecalciferol (D3) 50 MCG (2000 UT) TABS Take by mouth.     citalopram (CELEXA) 10 MG tablet Take 1 tablet (10 mg total) by mouth daily. 90 tablet 1   diltiazem (CARDIZEM) 30 MG tablet Take 1 tablet (30 mg total) by mouth 4 (four) times daily as  needed (elevated heart rates). 30 tablet 3   fluticasone (FLONASE) 50 MCG/ACT nasal spray Place 2 sprays into both nostrils daily. (Patient taking differently: Place 2 sprays into both nostrils as needed.) 16 g 6   ibuprofen (ADVIL,MOTRIN) 200 MG tablet Take 400-600 mg by mouth every 6 (six) hours as needed for headache or moderate pain.      isosorbide mononitrate (IMDUR) 30 MG 24 hr tablet Take 1 tablet (30 mg total) by mouth daily. 90 tablet 0   nitroGLYCERIN (  NITROSTAT) 0.4 MG SL tablet Place 1 tablet (0.4 mg total) under the tongue every 5 (five) minutes as needed for chest pain. 25 tablet 3   omeprazole (PRILOSEC OTC) 20 MG tablet Take 20 mg by mouth daily as needed (acid reflux).     sotalol (BETAPACE) 120 MG tablet Take 1 tablet (120 mg total) by mouth every 12 (twelve) hours. 60 tablet 1   valsartan (DIOVAN) 160 MG tablet Take 1 tablet (160 mg total) by mouth daily. 90 tablet 1   vitamin B-12 (CYANOCOBALAMIN) 1000 MCG tablet Take 1,000 mcg by mouth daily.     No current facility-administered medications on file prior to visit.    BP 130/80   Pulse (!) 57   Temp 98.1 F (36.7 C) (Oral)   Ht 5\' 11"  (1.803 m)   Wt 195 lb (88.5 kg)   SpO2 97%   BMI 27.20 kg/m       Objective:   Physical Exam Vitals and nursing note reviewed.  Constitutional:      Appearance: Normal appearance.  Cardiovascular:     Rate and Rhythm: Normal rate and regular rhythm.     Heart sounds: Normal heart sounds.  Musculoskeletal:        General: Normal range of motion.  Skin:    General: Skin is warm and dry.  Neurological:     General: No focal deficit present.     Mental Status: He is alert and oriented to person, place, and time.  Psychiatric:        Mood and Affect: Mood normal.        Behavior: Behavior normal.        Thought Content: Thought content normal.        Judgment: Judgment normal.           Assessment & Plan:   1. Anxiety and depression Flowsheet Row Office Visit from  02/19/2023 in [facility] Total Score 13      -I am glad he is making progress 2 weeks then.  Will keep him on Celexa 10 mg, he will send me a note in the next 2 weeks to let me know how he is doing.  We can increase if needed  Shirline Frees, NP

## 2023-03-06 ENCOUNTER — Encounter: Payer: Self-pay | Admitting: Physician Assistant

## 2023-03-06 ENCOUNTER — Ambulatory Visit (HOSPITAL_BASED_OUTPATIENT_CLINIC_OR_DEPARTMENT_OTHER): Payer: Medicare Other

## 2023-03-06 ENCOUNTER — Ambulatory Visit: Payer: Medicare Other | Attending: Physician Assistant | Admitting: Physician Assistant

## 2023-03-06 VITALS — BP 122/80 | HR 57 | Ht 71.0 in | Wt 195.0 lb

## 2023-03-06 DIAGNOSIS — I2510 Atherosclerotic heart disease of native coronary artery without angina pectoris: Secondary | ICD-10-CM

## 2023-03-06 DIAGNOSIS — E785 Hyperlipidemia, unspecified: Secondary | ICD-10-CM

## 2023-03-06 DIAGNOSIS — I4719 Other supraventricular tachycardia: Secondary | ICD-10-CM | POA: Diagnosis present

## 2023-03-06 DIAGNOSIS — Z79899 Other long term (current) drug therapy: Secondary | ICD-10-CM | POA: Insufficient documentation

## 2023-03-06 DIAGNOSIS — I10 Essential (primary) hypertension: Secondary | ICD-10-CM | POA: Insufficient documentation

## 2023-03-06 MED ORDER — PERFLUTREN LIPID MICROSPHERE
3.0000 mL | INTRAVENOUS | Status: AC | PRN
Start: 2023-03-06 — End: 2023-03-06
  Administered 2023-03-06: 3 mL via INTRAVENOUS

## 2023-03-06 NOTE — Progress Notes (Signed)
Cardiology Office Note Date:  03/06/2023  Patient ID:  Terry Hansen, Terry Hansen May 29, 1956, MRN [id number] PCP:  Shirline Frees, NP  Cardiologist:  Dr. Eden Emms Electrophysiologist: Dr. Elberta Fortis    Chief Complaint:   6 mo  History of Present Illness: Terry Hansen is a 67 y.o. male with history of CAD (CABG 2013), HTN, HLD, ATach, RBBB  He comes in today to be seen for Dr. Elberta Fortis, last seen by him July 2022, doing well on Sotalol, no changes were made.  I saw him 11/08/21 He is doing very well NO CP, palpitations or cardiac awareness. NO SOB No exertional capacities, though admits as he gets older has more aches/pains, not as energetic as he used to be No dizziness, near syncope or syncope. + ankle edema after sitting a while or on his feet more >>> amlodipine stopped QTc stable  He saw Dr. Eden Emms 08/21/22, doing OK< some family stuff living mostly in [place] city.  Discussed lipid management options.  Just saw T. Asa Lente, PA-C 02/18/23, struggling with anxiety, panic attacks, and BP with recent med changes, meds adjusted further.  Planned for updated echo.  TODAY He is doing better Anxiety is not fully controlled but is better No CP, palpitations or cardiac awareness No SOB BPs are better about 81mo ago he was suffering with significant plantar facitis and having a lot of foot pain, side lined him from his usual exercise for months >> then started striggling with his anxiety/panic-like attacks and really has not been very physically active Denies any exertional intolerances though No difficulties with his ADLs Hopes to get back to his walking, yard work soon. No near syncope or syncope.    AAD Hx Dec 2020 started on sotalol for ATach >> remains current Flecainide remotely  Past Medical History:  Diagnosis Date   CAD (coronary artery disease)    a.  LHC 06/26/12:  pLAD 90%, prox/mid/dist CFS 20-30%, oRCA 100% (non dom), EF 55%;  b. Echo 12/13:  EF 55-60%, Gr 1 diast dysfn;   c. s/p CABG Terry Hansen) 12/13: L-LAD/Dx     GERD (gastroesophageal reflux disease)    HLD (hyperlipidemia)    Hypertension    Ocular migraine    "maybe once q couple months" (06/02/2014)   Unstable angina pectoris [facility])     Past Surgical History:  Procedure Laterality Date   APPENDECTOMY  1970's   CARDIAC CATHETERIZATION  06/2012   CORONARY ARTERY BYPASS GRAFT  06/29/2012   Procedure: CORONARY ARTERY BYPASS GRAFTING (CABG);  Surgeon: Loreli Slot, MD;  Location: [facility] OR;  Service: Open Heart Surgery;  Laterality: N/A;  times two using Left Internal Mammary Artery   INGUINAL HERNIA REPAIR Bilateral [phone]   LEFT HEART CATH AND CORS/GRAFTS ANGIOGRAPHY N/A 08/16/2019   Procedure: LEFT HEART CATH AND CORS/GRAFTS ANGIOGRAPHY;  Surgeon: Marykay Lex, MD;  Location: [facility] INVASIVE CV LAB;  Service: Cardiovascular;  Laterality: N/A;   LEFT HEART CATHETERIZATION WITH CORONARY ANGIOGRAM N/A 06/26/2012   Procedure: LEFT HEART CATHETERIZATION WITH CORONARY ANGIOGRAM;  Surgeon: Wendall Stade, MD;  Location: [facility] CATH LAB;  Service: Cardiovascular;  Laterality: N/A;   LEFT HEART CATHETERIZATION WITH CORONARY/GRAFT ANGIOGRAM N/A 06/03/2014   Procedure: LEFT HEART CATHETERIZATION WITH Terry Hansen;  Surgeon: Corky Crafts, MD;  Location: [facility] CATH LAB;  Service: Cardiovascular;  Laterality: N/A;   NASAL SEPTUM SURGERY     For uncontrolled epistaxis early 2013   SIGMOIDOSCOPY  10-15 years ago   in [place], [place]  Current Outpatient Medications  Medication Sig Dispense Refill   amLODipine (NORVASC) 2.5 MG tablet Take 1 tablet (2.5 mg total) by mouth daily. 90 tablet 2   atorvastatin (LIPITOR) 40 MG tablet TAKE 1 TABLET BY MOUTH EVERY NIGHT AT BEDTIME 90 tablet 0   Cholecalciferol (D3) 50 MCG (2000 UT) TABS Take by mouth.     citalopram (CELEXA) 10 MG tablet Take 1 tablet (10 mg total) by mouth daily. 90 tablet 1   diltiazem (CARDIZEM) 30 MG tablet Take 1 tablet (30 mg total)  by mouth 4 (four) times daily as needed (elevated heart rates). 30 tablet 3   fluticasone (FLONASE) 50 MCG/ACT nasal spray Place 2 sprays into both nostrils daily. (Patient taking differently: Place 2 sprays into both nostrils as needed.) 16 g 6   ibuprofen (ADVIL,MOTRIN) 200 MG tablet Take 400-600 mg by mouth every 6 (six) hours as needed for headache or moderate pain.      isosorbide mononitrate (IMDUR) 30 MG 24 hr tablet Take 1 tablet (30 mg total) by mouth daily. 90 tablet 0   nitroGLYCERIN (NITROSTAT) 0.4 MG SL tablet Place 1 tablet (0.4 mg total) under the tongue every 5 (five) minutes as needed for chest pain. 25 tablet 3   omeprazole (PRILOSEC OTC) 20 MG tablet Take 20 mg by mouth daily as needed (acid reflux).     sotalol (BETAPACE) 120 MG tablet Take 1 tablet (120 mg total) by mouth every 12 (twelve) hours. 60 tablet 1   valsartan (DIOVAN) 160 MG tablet Take 1 tablet (160 mg total) by mouth daily. 90 tablet 1   vitamin B-12 (CYANOCOBALAMIN) 1000 MCG tablet Take 1,000 mcg by mouth daily.     No current facility-administered medications for this visit.    Allergies:   Patient has no known allergies.   Social History:  The patient  reports that he has never smoked. He has never used smokeless tobacco. He reports current alcohol use. He reports that he does not use drugs.   Family History:  The patient's family history includes Colon cancer (age of onset: 81) in his maternal grandfather; Heart attack in his brother; Heart disease in his brother, cousin, and paternal grandmother; Lung cancer in his maternal grandfather.  ROS:  Please see the history of present illness.    All other systems are reviewed and otherwise negative.   PHYSICAL EXAM:  VS:  There were no vitals taken for this visit. BMI: There is no height or weight on file to calculate BMI. Well nourished, well developed, in no acute distress HEENT: normocephalic, atraumatic Neck: no JVD, carotid bruits or masses Cardiac:  RRR; no significant murmurs, no rubs, or gallops Lungs: CTA b/l, no wheezing, rhonchi or rales Abd: soft, nontender MS: no deformity or atrophy Ext: no edema b/l Skin: warm and dry, no rash Neuro:  No gross deficits appreciated Psych: euthymic mood, full affect    EKG:  Done today and reviewed by myself shows  SB 57bpm, RBBB (not new), QTc  LHC 08/16/19   Angiographically no change in coronary disease as noted in 2015-proximal-mid LAD 80 to 85% focal stenosis at branch point with D1 and SP1. This lesion could explain results from stress test.  Not PCI target because it would likely jeopardize both grafts without potentially benefiting the SP branch. Widely patent LIMA-D1-LAD with retrograde filling to the original lesion Separate ostium dominant Left Circumflex with mild AV groove disease.  Small nondominant RCA.   TTE 04/09/19  1. Left  ventricular ejection fraction, by visual estimation, is 60 to  65%. The left ventricle has normal function. Normal left ventricular size.  There is no left ventricular hypertrophy.   2. Left ventricular diastolic Doppler parameters are consistent with  impaired relaxation pattern of LV diastolic filling.   3. Global right ventricle has normal systolic function.The right  ventricular size is normal. No increase in right ventricular wall  thickness.   4. Left atrial size was normal.   5. Right atrial size was normal.   6. The mitral valve is normal in structure. Mild mitral valve  regurgitation. No evidence of mitral stenosis.   7. The tricuspid valve is normal in structure. Tricuspid valve  regurgitation is mild.   8. The aortic valve is severely calcified, possibly bicuspid. Aortic  valve regurgitation is mild by color flow Doppler. Mild aortic valve  stenosis.   9. The pulmonic valve was normal in structure. Pulmonic valve  regurgitation is not visualized by color flow Doppler.  10. The inferior vena cava is normal in size with greater  than 50%  respiratory variability, suggesting right atrial pressure of 3 mmHg.   Recent Labs: 06/26/2022: Hemoglobin 15.8; Platelets 273.0; TSH 1.69 08/29/2022: ALT 22 02/25/2023: BUN 9; Creatinine, Ser 0.96; Potassium 4.1; Sodium 138  06/26/2022: VLDL 13.8 08/29/2022: Chol/HDL Ratio 2.7; Cholesterol, Total 105; HDL 39; LDL Chol Calc (NIH) 51; Triglycerides 69   CrCl cannot be calculated (Unknown ideal weight.).   Wt Readings from Last 3 Encounters:  02/19/23 195 lb (88.5 kg)  02/18/23 195 lb 12.8 oz (88.8 kg)  12/26/22 195 lb (88.5 kg)     Other studies reviewed: Additional studies/records reviewed today include: summarized above  ASSESSMENT AND PLAN:  ATach No symptoms of tachycardia QTc stable on Sotalol Mag level today  CAD No anginal sounding symptoms On statin, nitrate, sotalol (BB) C/w Dr. Glena Norfolk  Encouraged to resume walking/exercise  HTN Much better Home 110s-130/70's-80's   Disposition: back with EP in 44mo again, sooner if needed     Current medicines are reviewed at length with the patient today.  The patient did not have any concerns regarding medicines.  Norma Fredrickson, PA-C 03/06/2023 7:01 AM     [facility] [address] [place] [address] [phone] (office)  [phone] (fax)

## 2023-03-06 NOTE — Patient Instructions (Signed)
Medication Instructions:   Your physician recommends that you continue on your current medications as directed. Please refer to the Current Medication list given to you today.  *If you need a refill on your cardiac medications before your next appointment, please call your pharmacy*   Lab Work:  MAG TODAY    If you have labs (blood work) drawn today and your tests are completely normal, you will receive your results only by: MyChart Message (if you have MyChart) OR A paper copy in the mail If you have any lab test that is abnormal or we need to change your treatment, we will call you to review the results.   Testing/Procedures: NONE ORDERED  TODAY   Follow-Up: At [facility], you and your health needs are our priority.  As part of our continuing mission to provide you with exceptional heart care, we have created designated Provider Care Teams.  These Care Teams include your primary Cardiologist (physician) and Advanced Practice Providers (APPs -  Physician Assistants and Nurse Practitioners) who all work together to provide you with the care you need, when you need it.  We recommend signing up for the patient portal called "MyChart".  Sign up information is provided on this After Visit Summary.  MyChart is used to connect with patients for Virtual Visits (Telemedicine).  Patients are able to view lab/test results, encounter notes, upcoming appointments, etc.  Non-urgent messages can be sent to your provider as well.   To learn more about what you can do with MyChart, go to [email or web address].    Your next appointment:    6 month(s)  Provider:    Loman Brooklyn, MD or Francis Dowse, PA-C    Other Instructions

## 2023-03-07 LAB — MAGNESIUM: Magnesium: 1.9 mg/dL (ref 1.6–2.3)

## 2023-03-10 LAB — ECHOCARDIOGRAM COMPLETE
AR max vel: 1.21 cm2
AV Area VTI: 1.17 cm2
AV Area mean vel: 1.21 cm2
AV Mean grad: 13.5 mmHg
AV Peak grad: 26.3 mmHg
Ao pk vel: 2.57 m/s
Area-P 1/2: 2.43 cm2
Height: 71 in
S' Lateral: 3.2 cm
Weight: 3120 [oz_av]

## 2023-03-19 ENCOUNTER — Other Ambulatory Visit: Payer: Self-pay | Admitting: Adult Health

## 2023-03-19 ENCOUNTER — Other Ambulatory Visit: Payer: Self-pay | Admitting: Cardiovascular Disease

## 2023-03-21 ENCOUNTER — Other Ambulatory Visit: Payer: Self-pay | Admitting: Cardiovascular Disease

## 2023-03-21 ENCOUNTER — Telehealth: Payer: Self-pay | Admitting: *Deleted

## 2023-03-21 MED ORDER — ASPIRIN 81 MG PO TBEC: 81.0000 mg | DELAYED_RELEASE_TABLET | Freq: Every day | ORAL | Status: AC

## 2023-03-21 NOTE — Telephone Encounter (Signed)
-----   Message from Sheilah Pigeon sent at 03/13/2023  4:42 PM EDT ----- Can you please call him, start ECASA 81mg  daily for his hx of CAD.  Thanks ----- Message ----- From: Sharlene Dory, PA-C Sent: 03/07/2023   1:26 PM EDT To: Sheilah Pigeon, PA-C  You are right, needs to be on ASA. Thanks for seeing him!  Sharlene Dory, PA-C ----- Message ----- From: Sheilah Pigeon, PA-C Sent: 03/06/2023   5:02 PM EDT To: Sharlene Dory, PA-C  His BP is improved, ill leave his home readings for you, he asked me to make sure you got them.  SUch a nice guy.  He is not on ASA,, ?

## 2023-03-21 NOTE — Telephone Encounter (Signed)
Spoke with patient make sure he is taking Aspirin due to it wasn't on his list . Patient is currently taking Aspirin, but wanted know about his blood pressure readings he sent in and were they okay??.

## 2023-04-04 ENCOUNTER — Other Ambulatory Visit: Payer: Self-pay

## 2023-04-04 MED ORDER — SOTALOL HCL 120 MG PO TABS: 120.0000 mg | ORAL_TABLET | Freq: Two times a day (BID) | ORAL | 3 refills | Status: DC

## 2023-06-03 ENCOUNTER — Other Ambulatory Visit: Payer: Self-pay | Admitting: Adult Health

## 2023-06-23 ENCOUNTER — Other Ambulatory Visit: Payer: Self-pay

## 2023-06-23 MED ORDER — AMLODIPINE BESYLATE 2.5 MG PO TABS: 2.5000 mg | ORAL_TABLET | Freq: Every day | ORAL | 2 refills | Status: DC

## 2023-07-25 ENCOUNTER — Telehealth: Payer: Self-pay | Admitting: *Deleted

## 2023-07-25 ENCOUNTER — Other Ambulatory Visit: Payer: Self-pay | Admitting: Adult Health

## 2023-07-25 DIAGNOSIS — F32A Depression, unspecified: Secondary | ICD-10-CM

## 2023-07-25 NOTE — Telephone Encounter (Signed)
 Copied from CRM [id number]. Topic: Clinical - Medication Refill >> Jul 25, 2023 11:07 AM Corin V wrote: Most Recent Primary Care Visit:  Provider: MERNA HUXLEY  Department: [facility]  Visit Type: OFFICE VISIT  Date: 02/19/2023  Medication: ***  Has the patient contacted their pharmacy?  (Agent: If no, request that the patient contact the pharmacy for the refill. If patient does not wish to contact the pharmacy document the reason why and proceed with request.) (Agent: If yes, when and what did the pharmacy advise?)  Is this the correct pharmacy for this prescription?  If no, delete pharmacy and type the correct one.  This is the patient's preferred pharmacy:  [facility] [id number] [facility] [place] - [address] [place] [address] Phone: [phone] Fax: [phone]   Has the prescription been filled recently?   Is the patient out of the medication?   Has the patient been seen for an appointment in the last year OR does the patient have an upcoming appointment?   Can we respond through MyChart?   Agent: Please be advised that Rx refills may take up to 3 business days. We ask that you follow-up with your pharmacy.

## 2023-07-25 NOTE — Telephone Encounter (Signed)
 Copied from CRM [id number]. Topic: Clinical - Medication Refill >> Jul 25, 2023 11:07 AM Corin V wrote: Most Recent Primary Care Visit:  Provider: MERNA HUXLEY  Department: [facility]  Visit Type: OFFICE VISIT  Date: 02/19/2023  Medication: citalopram  (CELEXA ) 10 MG tablet  Has the patient contacted their pharmacy? Yes (Agent: If no, request that the patient contact the pharmacy for the refill. If patient does not wish to contact the pharmacy document the reason why and proceed with request.) (Agent: If yes, when and what did the pharmacy advise?)  Is this the correct pharmacy for this prescription? Yes If no, delete pharmacy and type the correct one.  This is the patient's preferred pharmacy:  [facility] [id number] [facility] [place] - [address] [place] [address] Phone: [phone] Fax: [phone]   Has the prescription been filled recently? No  Is the patient out of the medication? No  Has the patient been seen for an appointment in the last year OR does the patient have an upcoming appointment? Yes  Can we respond through MyChart? Yes  Agent: Please be advised that Rx refills may take up to 3 business days. We ask that you follow-up with your pharmacy.

## 2023-07-27 ENCOUNTER — Other Ambulatory Visit: Payer: Self-pay | Admitting: Adult Health

## 2023-07-27 DIAGNOSIS — F32A Depression, unspecified: Secondary | ICD-10-CM

## 2023-07-28 ENCOUNTER — Other Ambulatory Visit: Payer: Self-pay | Admitting: Adult Health

## 2023-07-28 DIAGNOSIS — F32A Depression, unspecified: Secondary | ICD-10-CM

## 2023-07-28 MED ORDER — CITALOPRAM HYDROBROMIDE 10 MG PO TABS: 10.0000 mg | ORAL_TABLET | Freq: Every day | ORAL | 1 refills | Status: DC

## 2023-07-28 NOTE — Telephone Encounter (Signed)
 Rx refilled.

## 2023-07-28 NOTE — Telephone Encounter (Signed)
 Copied from CRM [id number]. Topic: Clinical - Medication Refill >> Jul 28, 2023 10:01 AM Evie NOVAK wrote: Most Recent Primary Care Visit:  Provider: MERNA HUXLEY  Department: [facility]  Visit Type: OFFICE VISIT  Date: 02/19/2023  Medication: citalopram  (CELEXA ) 10 MG tablet   Has the patient contacted their pharmacy? Yes (Agent: If no, request that the patient contact the pharmacy for the refill. If patient does not wish to contact the pharmacy document the reason why and proceed with request.) (Agent: If yes, when and what did the pharmacy advise?)  Is this the correct pharmacy for this prescription? Yes If no, delete pharmacy and type the correct one.  This is the patient's preferred pharmacy:  [facility] [id number] [facility] [place] - [address] [place] [address] Phone: [phone] Fax: [phone]   Has the prescription been filled recently? Yes  Is the patient out of the medication? Yes  Has the patient been seen for an appointment in the last year OR does the patient have an upcoming appointment? Yes  Can we respond through MyChart? No  Agent: Please be advised that Rx refills may take up to 3 business days. We ask that you follow-up with your pharmacy.

## 2023-07-28 NOTE — Addendum Note (Signed)
 Addended by: Waymon Amato R on: 07/28/2023 05:27 PM   Modules accepted: Orders

## 2023-08-04 ENCOUNTER — Other Ambulatory Visit: Payer: Self-pay | Admitting: Physician Assistant

## 2023-09-01 NOTE — Progress Notes (Signed)
Cardiology Office Note:  .   Date:  09/09/2023  ID:  Terry Hansen, DOB 26-Oct-1955, MRN [id number] PCP: Shirline Frees, NP  [facility] Providers Cardiologist:  Charlton Haws, MD Electrophysiologist:  Will Jorja Loa, MD    History of Present Illness: .   Terry Hansen is a 68 y.o. male  with a past medical history of CABG in 2013 (LIMA to LAD/diagonal with patent cath 06/03/2014), CRF, hypertension, hyperlipidemia, atrial tachycardia (sees Dr. Elberta Fortis and on Cardizem and sotalol), fatigue with beta-blockers.   Patient comes in for yearly f/u. Came from [place]. Saw EP yesterday. Denies chest pain, palpitations, dyspnea. Occasional ankle swelling if he sits a lot. No regular exercise in 5-6 months. Had a cyst removed from the base of his neck. It hurt to move his arms. Had labs by PCP this am.   ROS:    Studies Reviewed: Marland Kitchen         Prior CV Studies:    Echo 02/2023 IMPRESSIONS     1. Left ventricular ejection fraction, by estimation, is 60 to 65%. The  left ventricle has normal function. The left ventricle has no regional  wall motion abnormalities. There is mild left ventricular hypertrophy.   2. Right ventricular systolic function is normal. The right ventricular  size is normal.   3. Mild mitral valve regurgitation.   4. AV difficult to see well Peak and mean gradients through the valve are  25 and 13 mm Hg respectively. AVA (VTI) is 1.11 cm2 . Dimensionless index  is 0.37. OVerall consistent with mild to moderate AS. Marland Kitchen The aortic valve  is tricuspid. Aortic valve  regurgitation is trivial. Aortic valve sclerosis/calcification is present,  without any evidence of aortic stenosis.   Cardiac catheterization 08/16/2019 Left Anterior Descending  Prox LAD to Mid LAD lesion is 85% stenosed with 100% stenosed side branch in 1st Diag. The lesion is located at the bifurcation, discrete and irregular. The lesion is calcified.    First Septal Branch  1st Sept lesion  is 80% stenosed. The lesion is located at the bifurcation.    Second Diagonal Branch  Vessel is small in size.    Second Septal Branch  Vessel is small in size.    Third Diagonal Branch  Vessel is small in size.    Third Septal Branch  Vessel is small in size.    Left Circumflex  Vessel is large.    Third Left Posterolateral Branch  Vessel is small in size.    Left Posterior Atrioventricular Artery  Vessel is large in size.    Right Coronary Artery  Vessel is small.    Acute Marginal Branch  Vessel is small in size.    Sequential LIMA LIMA Graft To 1st Diag, Mid LAD  LIMA graft was visualized by angiography and is large. The graft exhibits no disease. There is competitive flow. Sequential LIMA-D1-LAD    Intervention    No interventions have been documented.    Wall Motion           Resting                          Risk Assessment/Calculations:             Physical Exam:   VS:  BP 130/80   Pulse 69   Ht 5\' 11"  (1.803 m)   Wt 199 lb 12.8 oz (90.6 kg)   SpO2 98%   BMI 27.87  kg/m    Wt Readings from Last 3 Encounters:  09/09/23 199 lb 12.8 oz (90.6 kg)  09/09/23 199 lb (90.3 kg)  09/08/23 200 lb 6.4 oz (90.9 kg)    GEN: Well nourished, well developed in no acute distress NECK: right carotid bruit, No JVD;   CARDIAC:  RRR, 1/6 systolic murmur LSB RESPIRATORY:  Clear to auscultation without rales, wheezing or rhonchi  ABDOMEN: Soft, non-tender, non-distended EXTREMITIES:  No edema; No deformity   ASSESSMENT AND PLAN: .     CAD/status post CABG -No chest pains or shortness of breath  -Continue current medication regimen which includes amlodipine 2.5 mg daily, Lipitor 40 mg daily, diltiazem 30 mg as needed, valsartan 160 mg daily, Betapace 120 mg      Atrial tachycardia -cardizem, sotalol control symptoms -Saw EPS yesterday.    HTN -BP doing much better on valsartan     HLD -Last lipid panel with LDL 51, HDL 39, total Chol 105,  triglycerides 69 -Now, continue Lipitor 40 mg daily - PCP did labs this am.   Anxiety  -managed by PCP  Right carotid bruit-check carotid dopplers. Mild plaque on dopplers 2013.   Mild MR on echo 02/2023             Dispo: f/u 1 yr.  Signed, Jacolyn Reedy, PA-C

## 2023-09-06 NOTE — Progress Notes (Unsigned)
Cardiology Office Note Date:  09/06/2023  Patient ID:  Terry Hansen, Terry Hansen 1956-03-21, MRN [id number] PCP:  Shirline Frees, NP  Cardiologist:  Dr. [place] Emms Electrophysiologist: Dr. Elberta Fortis    Chief Complaint:   6 mo  History of Present Illness: Terry Hansen is a 68 y.o. male with history of CAD (CABG 2013), HTN, HLD, ATach, RBBB  He comes in today to be seen for Dr. Elberta Fortis, last seen by him July 2022, doing well on Sotalol, no changes were made.  I saw him 11/08/21 He is doing very well No CP, palpitations or cardiac awareness. No SOB No exertional capacities, though admits as he gets older has more aches/pains, not as energetic as he used to be No dizziness, near syncope or syncope. + ankle edema after sitting a while or on his feet more >>> amlodipine stopped QTc stable  He saw Dr. [place] Emms 08/21/22, doing OK< some family stuff living mostly in [place].  Discussed lipid management options.  Just saw T. Asa Lente, PA-C 02/18/23, struggling with anxiety, panic attacks, and BP with recent med changes, meds adjusted further.  Planned for updated echo.  I saw him Aug 2024 He is doing better Anxiety is not fully controlled but is better No CP, palpitations or cardiac awareness No SOB BPs are better about 103mo ago he was suffering with significant plantar facitis and having a lot of foot pain, side lined him from his usual exercise for months >> then started struggling with his anxiety/panic-like attacks and really has not been very physically active Denies any exertional intolerances though No difficulties with his ADLs Hopes to get back to his walking, yard work soon. No near syncope or syncope. No changes Stable Qtc Planned for 6 mo  TODAY He is doing well [place] as active in the winter, but plans to get moving more soon No CP, palpitations, cardiac awareness No symptoms of his Atach No near syncope or syncope  Sees his PMD tomorrow as well as cards APP    AAD  Hx Dec 2020 started on sotalol for ATach >> remains current Flecainide remotely  Past Medical History:  Diagnosis Date   CAD (coronary artery disease)    a.  LHC 06/26/12:  pLAD 90%, prox/mid/dist CFS 20-30%, oRCA 100% (non dom), EF 55%;  b. Echo 12/13:  EF 55-60%, Gr 1 diast dysfn;  c. s/p CABG [facility]) 12/13: L-LAD/Dx     GERD (gastroesophageal reflux disease)    HLD (hyperlipidemia)    Hypertension    Ocular migraine    "maybe once q couple months" (06/02/2014)   Unstable angina pectoris [facility])     Past Surgical History:  Procedure Laterality Date   APPENDECTOMY  1970's   CARDIAC CATHETERIZATION  06/2012   CORONARY ARTERY BYPASS GRAFT  06/29/2012   Procedure: CORONARY ARTERY BYPASS GRAFTING (CABG);  Surgeon: Loreli Slot, MD;  Location: [facility] OR;  Service: Open Heart Surgery;  Laterality: N/A;  times two using Left Internal Mammary Artery   INGUINAL HERNIA REPAIR Bilateral [phone]   LEFT HEART CATH AND CORS/GRAFTS ANGIOGRAPHY N/A 08/16/2019   Procedure: LEFT HEART CATH AND CORS/GRAFTS ANGIOGRAPHY;  Surgeon: Marykay Lex, MD;  Location: [facility] INVASIVE CV LAB;  Service: Cardiovascular;  Laterality: N/A;   LEFT HEART CATHETERIZATION WITH CORONARY ANGIOGRAM N/A 06/26/2012   Procedure: LEFT HEART CATHETERIZATION WITH CORONARY ANGIOGRAM;  Surgeon: Wendall Stade, MD;  Location: [facility] CATH LAB;  Service: Cardiovascular;  Laterality: N/A;   LEFT HEART CATHETERIZATION WITH CORONARY/GRAFT  ANGIOGRAM N/A 06/03/2014   Procedure: LEFT HEART CATHETERIZATION WITH Isabel Caprice;  Surgeon: Corky Crafts, MD;  Location: [facility] CATH LAB;  Service: Cardiovascular;  Laterality: N/A;   NASAL SEPTUM SURGERY     For uncontrolled epistaxis early 2013   SIGMOIDOSCOPY  10-15 years ago   in [place], [place]    Current Outpatient Medications  Medication Sig Dispense Refill   amLODipine (NORVASC) 2.5 MG tablet Take 1 tablet (2.5 mg total) by mouth daily. 90 tablet 2   aspirin EC 81 MG  tablet Take 1 tablet (81 mg total) by mouth daily. Swallow whole.     atorvastatin (LIPITOR) 40 MG tablet TAKE 1 TABLET BY MOUTH EVERY NIGHT AT BEDTIME 90 tablet 0   Cholecalciferol (D3) 50 MCG (2000 UT) TABS Take by mouth.     citalopram (CELEXA) 10 MG tablet Take 1 tablet (10 mg total) by mouth daily. 90 tablet 1   diltiazem (CARDIZEM) 30 MG tablet Take 1 tablet (30 mg total) by mouth 4 (four) times daily as needed (elevated heart rates). 30 tablet 3   fluticasone (FLONASE) 50 MCG/ACT nasal spray Place 2 sprays into both nostrils daily. (Patient taking differently: Place 2 sprays into both nostrils as needed.) 16 g 6   ibuprofen (ADVIL,MOTRIN) 200 MG tablet Take 400-600 mg by mouth every 6 (six) hours as needed for headache or moderate pain.      isosorbide mononitrate (IMDUR) 30 MG 24 hr tablet TAKE 1 TABLET BY MOUTH DAILY 90 tablet 3   nitroGLYCERIN (NITROSTAT) 0.4 MG SL tablet Place 1 tablet (0.4 mg total) under the tongue every 5 (five) minutes as needed for chest pain. 25 tablet 3   omeprazole (PRILOSEC OTC) 20 MG tablet Take 20 mg by mouth daily as needed (acid reflux).     sotalol (BETAPACE) 120 MG tablet Take 1 tablet (120 mg total) by mouth every 12 (twelve) hours. 180 tablet 3   valsartan (DIOVAN) 160 MG tablet TAKE 1 TABLET BY MOUTH DAILY 90 tablet 1   vitamin B-12 (CYANOCOBALAMIN) 1000 MCG tablet Take 1,000 mcg by mouth daily.     No current facility-administered medications for this visit.    Allergies:   Patient has no known allergies.   Social History:  The patient  reports that he has never smoked. He has never used smokeless tobacco. He reports current alcohol use. He reports that he does not use drugs.   Family History:  The patient's family history includes Colon cancer (age of onset: 37) in his maternal grandfather; Heart attack in his brother; Heart disease in his brother, cousin, and paternal grandmother; Lung cancer in his maternal grandfather.  ROS:  Please see the  history of present illness.    All other systems are reviewed and otherwise negative.   PHYSICAL EXAM:  VS:  There were no vitals taken for this visit. BMI: There is no height or weight on file to calculate BMI. Well nourished, well developed, in no acute distress HEENT: normocephalic, atraumatic Neck: no JVD, carotid bruits or masses Cardiac:  RRR; no significant murmurs, no rubs, or gallops Lungs: CTA b/l, no wheezing, rhonchi or rales Abd: soft, nontender MS: no deformity or atrophy Ext: no edema b/l Skin: warm and dry, no rash Neuro:  No gross deficits appreciated Psych: euthymic mood, full affect    EKG:  Done today and reviewed by myself shows  SR 66bpm, RBBB, QTc (not accounting for QRS) is  03/06/23: SB 57bpm, RBBB (not new),  QTc    LHC 08/16/19   Angiographically no change in coronary disease as noted in 2015-proximal-mid LAD 80 to 85% focal stenosis at branch point with D1 and SP1. This lesion could explain results from stress test.  Not PCI target because it would likely jeopardize both grafts without potentially benefiting the SP branch. Widely patent LIMA-D1-LAD with retrograde filling to the original lesion Separate ostium dominant Left Circumflex with mild AV groove disease.  Small nondominant RCA.   TTE 04/09/19  1. Left ventricular ejection fraction, by visual estimation, is 60 to  65%. The left ventricle has normal function. Normal left ventricular size.  There is no left ventricular hypertrophy.   2. Left ventricular diastolic Doppler parameters are consistent with  impaired relaxation pattern of LV diastolic filling.   3. Global right ventricle has normal systolic function.The right  ventricular size is normal. No increase in right ventricular wall  thickness.   4. Left atrial size was normal.   5. Right atrial size was normal.   6. The mitral valve is normal in structure. Mild mitral valve  regurgitation. No evidence of mitral stenosis.   7.  The tricuspid valve is normal in structure. Tricuspid valve  regurgitation is mild.   8. The aortic valve is severely calcified, possibly bicuspid. Aortic  valve regurgitation is mild by color flow Doppler. Mild aortic valve  stenosis.   9. The pulmonic valve was normal in structure. Pulmonic valve  regurgitation is not visualized by color flow Doppler.  10. The inferior vena cava is normal in size with greater than 50%  respiratory variability, suggesting right atrial pressure of 3 mmHg.   Recent Labs: 02/25/2023: BUN 9; Creatinine, Ser 0.96; Potassium 4.1; Sodium 138 03/06/2023: Magnesium 1.9  No results found for requested labs within last 365 days.   CrCl cannot be calculated (Patient's most recent lab result is older than the maximum 21 days allowed.).   Wt Readings from Last 3 Encounters:  03/06/23 195 lb (88.5 kg)  02/19/23 195 lb (88.5 kg)  02/18/23 195 lb 12.8 oz (88.8 kg)     Other studies reviewed: Additional studies/records reviewed today include: summarized above  ASSESSMENT AND PLAN:  ATach No symptoms of tachycardia QTc stable on Sotalol  Asked that his Primary include BMET/Mag level with his labs tomorrow (written on his AVS for him)  CAD No anginal sounding symptoms On statin, nitrate, sotalol (BB) C/w Dr. Glena Norfolk  Encouraged to resume walking/exercise  HTN Looks good   Disposition:  back with EP in 43mo again, sooner if needed     Current medicines are reviewed at length with the patient today.  The patient did not have any concerns regarding medicines.  Norma Fredrickson, PA-C 09/06/2023 3:57 PM     [facility] [address] [place] [address] [phone] (office)  [phone] (fax)

## 2023-09-08 ENCOUNTER — Encounter: Payer: Self-pay | Admitting: Physician Assistant

## 2023-09-08 ENCOUNTER — Ambulatory Visit: Payer: Medicare Other | Admitting: Cardiovascular Disease

## 2023-09-08 ENCOUNTER — Ambulatory Visit: Payer: Medicare Other | Attending: Physician Assistant | Admitting: Physician Assistant

## 2023-09-08 VITALS — BP 130/86 | HR 73 | Ht 71.0 in | Wt 200.4 lb

## 2023-09-08 DIAGNOSIS — Z79899 Other long term (current) drug therapy: Secondary | ICD-10-CM | POA: Diagnosis not present

## 2023-09-08 DIAGNOSIS — I4719 Other supraventricular tachycardia: Secondary | ICD-10-CM | POA: Diagnosis present

## 2023-09-08 DIAGNOSIS — Z5181 Encounter for therapeutic drug level monitoring: Secondary | ICD-10-CM

## 2023-09-08 NOTE — Patient Instructions (Signed)
  Medication Instructions:   Your physician recommends that you continue on your current medications as directed. Please refer to the Current Medication list given to you today.   *If you need a refill on your cardiac medications before your next appointment, please call your pharmacy*   Lab Work:   PLEASE GET  BMET AND MAG LEVEL  AT PRIMARY DR OFFICE   If you have labs (blood work) drawn today and your tests are completely normal, you will receive your results only by: MyChart Message (if you have MyChart) OR A paper copy in the mail If you have any lab test that is abnormal or we need to change your treatment, we will call you to review the results.   Testing/Procedures: NONE ORDERED  TODAY    Follow-Up: At [facility], you and your health needs are our priority.  As part of our continuing mission to provide you with exceptional heart care, we have created designated Provider Care Teams.  These Care Teams include your primary Cardiologist (physician) and Advanced Practice Providers (APPs -  Physician Assistants and Nurse Practitioners) who all work together to provide you with the care you need, when you need it.  We recommend signing up for the patient portal called "MyChart".  Sign up information is provided on this After Visit Summary.  MyChart is used to connect with patients for Virtual Visits (Telemedicine).  Patients are able to view lab/test results, encounter notes, upcoming appointments, etc.  Non-urgent messages can be sent to your provider as well.   To learn more about what you can do with MyChart, go to [email or web address].     Your next appointment:    6 month(s)   Provider:    You may see Will Jorja Loa, MD or one of the following Advanced Practice Providers on your designated Care Team:   Francis Dowse, [place]  Other Instructions

## 2023-09-09 ENCOUNTER — Ambulatory Visit: Payer: Medicare Other

## 2023-09-09 ENCOUNTER — Ambulatory Visit: Payer: Medicare Other | Attending: Physician Assistant | Admitting: Physician Assistant

## 2023-09-09 ENCOUNTER — Encounter: Payer: Self-pay | Admitting: Physician Assistant

## 2023-09-09 ENCOUNTER — Ambulatory Visit: Payer: Medicare Other | Admitting: Adult Health

## 2023-09-09 VITALS — BP 130/80 | HR 69 | Ht 71.0 in | Wt 199.8 lb

## 2023-09-09 VITALS — BP 128/88 | HR 57 | Temp 97.8°F | Ht 70.75 in | Wt 199.0 lb

## 2023-09-09 DIAGNOSIS — I2510 Atherosclerotic heart disease of native coronary artery without angina pectoris: Secondary | ICD-10-CM | POA: Diagnosis not present

## 2023-09-09 DIAGNOSIS — E785 Hyperlipidemia, unspecified: Secondary | ICD-10-CM | POA: Diagnosis present

## 2023-09-09 DIAGNOSIS — F411 Generalized anxiety disorder: Secondary | ICD-10-CM | POA: Diagnosis present

## 2023-09-09 DIAGNOSIS — F419 Anxiety disorder, unspecified: Secondary | ICD-10-CM | POA: Diagnosis not present

## 2023-09-09 DIAGNOSIS — I10 Essential (primary) hypertension: Secondary | ICD-10-CM

## 2023-09-09 DIAGNOSIS — R0989 Other specified symptoms and signs involving the circulatory and respiratory systems: Secondary | ICD-10-CM | POA: Insufficient documentation

## 2023-09-09 DIAGNOSIS — N401 Enlarged prostate with lower urinary tract symptoms: Secondary | ICD-10-CM

## 2023-09-09 DIAGNOSIS — I4719 Other supraventricular tachycardia: Secondary | ICD-10-CM | POA: Diagnosis not present

## 2023-09-09 DIAGNOSIS — F32A Depression, unspecified: Secondary | ICD-10-CM

## 2023-09-09 DIAGNOSIS — R3912 Poor urinary stream: Secondary | ICD-10-CM

## 2023-09-09 LAB — COMPREHENSIVE METABOLIC PANEL
ALT: 24 U/L (ref 0–53)
AST: 17 U/L (ref 0–37)
Albumin: 4.1 g/dL (ref 3.5–5.2)
Alkaline Phosphatase: 141 U/L — ABNORMAL HIGH (ref 39–117)
BUN: 11 mg/dL (ref 6–23)
CO2: 32 meq/L (ref 19–32)
Calcium: 8.9 mg/dL (ref 8.4–10.5)
Chloride: 100 meq/L (ref 96–112)
Creatinine, Ser: 0.83 mg/dL (ref 0.40–1.50)
GFR: 88.97 mL/min (ref >60.00)
Glucose, Bld: 96 mg/dL (ref 70–99)
Potassium: 4.1 meq/L (ref 3.5–5.1)
Sodium: 138 meq/L (ref 135–145)
Total Bilirubin: 0.8 mg/dL (ref 0.2–1.2)
Total Protein: 6.4 g/dL (ref 6.0–8.3)

## 2023-09-09 LAB — LIPID PANEL
Cholesterol: 96 mg/dL (ref 0–200)
HDL: 38.6 mg/dL — ABNORMAL LOW (ref >39.00)
LDL Cholesterol: 45 mg/dL (ref 0–99)
NonHDL: 57.66
Total CHOL/HDL Ratio: 2
Triglycerides: 62 mg/dL (ref 0.0–149.0)
VLDL: 12.4 mg/dL (ref 0.0–40.0)

## 2023-09-09 LAB — TSH: TSH: 1.79 u[IU]/mL (ref 0.35–5.50)

## 2023-09-09 LAB — CBC
HCT: 43.9 % (ref 39.0–52.0)
Hemoglobin: 15.2 g/dL (ref 13.0–17.0)
MCHC: 34.6 g/dL (ref 30.0–36.0)
MCV: 87.3 fL (ref 78.0–100.0)
Platelets: 235 10*3/uL (ref 150.0–400.0)
RBC: 5.03 Mil/uL (ref 4.22–5.81)
RDW: 13.1 % (ref 11.5–15.5)
WBC: 6.2 10*3/uL (ref 4.0–10.5)

## 2023-09-09 LAB — MAGNESIUM: Magnesium: 1.7 mg/dL (ref 1.5–2.5)

## 2023-09-09 LAB — PSA: PSA: 1.08 ng/mL (ref 0.10–4.00)

## 2023-09-09 NOTE — Patient Instructions (Signed)
Medication Instructions:  Your physician recommends that you continue on your current medications as directed. Please refer to the Current Medication list given to you today.  *If you need a refill on your cardiac medications before your next appointment, please call your pharmacy*   Lab Work: None ordered  If you have labs (blood work) drawn today and your tests are completely normal, you will receive your results only by: MyChart Message (if you have MyChart) OR A paper copy in the mail If you have any lab test that is abnormal or we need to change your treatment, we will call you to review the results.   Testing/Procedures: Your physician has requested that you have a carotid duplex. This test is an ultrasound of the carotid arteries in your neck. It looks at blood flow through these arteries that supply the brain with blood. Allow one hour for this exam. There are no restrictions or special instructions. PLEASE MAKE SURE THEY FAX [place] THE RESULTS TO [phone]    Follow-Up: At [facility], you and your health needs are our priority.  As part of our continuing mission to provide you with exceptional heart care, we have created designated Provider Care Teams.  These Care Teams include your primary Cardiologist (physician) and Advanced Practice Providers (APPs -  Physician Assistants and Nurse Practitioners) who all work together to provide you with the care you need, when you need it.  We recommend signing up for the patient portal called "MyChart".  Sign up information is provided on this After Visit Summary.  MyChart is used to connect with patients for Virtual Visits (Telemedicine).  Patients are able to view lab/test results, encounter notes, upcoming appointments, etc.  Non-urgent messages can be sent to your provider as well.   To learn more about what you can do with MyChart, go to [email or web address].    Your next appointment:   12 month(s)  Provider:   Charlton Haws, MD     Other Instructions     1st Floor: - Lobby - Registration  - Pharmacy  - Lab - Cafe  2nd Floor: - PV Lab - Diagnostic Testing (echo, CT, nuclear med)  3rd Floor: - Vacant  4th Floor: - TCTS (cardiothoracic surgery) - AFib Clinic - Structural Heart Clinic - Vascular Surgery  - Vascular Ultrasound  5th Floor: - HeartCare Cardiology (general and EP) - Clinical Pharmacy for coumadin, hypertension, lipid, weight-loss medications, and med management appointments    Valet parking services will be available as well.

## 2023-09-09 NOTE — Progress Notes (Signed)
Subjective:    Patient ID: Terry Hansen, male    DOB: 1956-04-07, 68 y.o.   MRN: [id number]  HPI Patient presents for yearly preventative medicine examination. He is a pleasant 68 year old male who  has a past medical history of CAD (coronary artery disease), GERD (gastroesophageal reflux disease), HLD (hyperlipidemia), Hypertension, Ocular migraine, and Unstable angina pectoris (HCC).  Anxiety and Depression - managed with Celexa 10 mg daily   Hypertension-currently managed with Valsartan 160 mg daily and Norvasc 2.5 mg daily.  He did have some lower extremity edema with higher doses of Norvasc. he denies dizziness, lightheadedness, chest pain, shortness of breath, or syncopal episodes BP Readings from Last 3 Encounters:  09/09/23 128/88  09/08/23 130/86  03/06/23 122/80   CAD status post CABG-followed by cardiology on a routine basis.  His CABG was in 2013.  He denies palpitations, chest pain, shortness of breath, orthopnea, PND, lower extremity edema, claudication, dizziness.  He is managed with Lipitor 40 mg daily  and aspirin 81 mg Lab Results  Component Value Date   CHOL 105 08/29/2022   HDL 39 (L) 08/29/2022   LDLCALC 51 08/29/2022   TRIG 69 08/29/2022   CHOLHDL 2.7 08/29/2022   Atrial tachycardia-followed by cardiology.  Managed with Cardizem PRN and sotalol120 mg daily.   BPH - has started to experience decreased stream and nocturia ( 2 x a night)   All immunizations and health maintenance protocols were reviewed with the patient and needed orders were placed.  Appropriate screening laboratory values were ordered for the patient including screening of hyperlipidemia, renal function and hepatic function. If indicated by BPH, a PSA was ordered.  Medication reconciliation,  past medical history, social history, problem list and allergies were reviewed in detail with the patient  Goals were established with regard to weight loss, exercise, and  diet in compliance with  medications. He has not been exercising since it has been cold outside but plans to get back into exercise once it warms back up.   Wt Readings from Last 3 Encounters:  09/09/23 199 lb (90.3 kg)  09/08/23 200 lb 6.4 oz (90.9 kg)  03/06/23 195 lb (88.5 kg)   Review of Systems  Constitutional: Negative.   HENT: Negative.    Eyes: Negative.   Respiratory: Negative.    Cardiovascular: Negative.   Gastrointestinal: Negative.   Endocrine: Negative.   Genitourinary: Negative.   Musculoskeletal: Negative.   Skin: Negative.   Allergic/Immunologic: Negative.   Neurological: Negative.   Hematological: Negative.   Psychiatric/Behavioral: Negative.    All other systems reviewed and are negative.  Past Medical History:  Diagnosis Date   CAD (coronary artery disease)    a.  LHC 06/26/12:  pLAD 90%, prox/mid/dist CFS 20-30%, oRCA 100% (non dom), EF 55%;  b. Echo 12/13:  EF 55-60%, Gr 1 diast dysfn;  c. s/p CABG Dorris Fetch) 12/13: L-LAD/Dx     GERD (gastroesophageal reflux disease)    HLD (hyperlipidemia)    Hypertension    Ocular migraine    "maybe once q couple months" (06/02/2014)   Unstable angina pectoris (HCC)     Social History   Socioeconomic History   Marital status: Divorced    Spouse name: Not on file   Number of children: Not on file   Years of education: Not on file   Highest education level: Not on file  Occupational History   Occupation: retired  Tobacco Use   Smoking status: Never   Smokeless tobacco:  Never  Vaping Use   Vaping status: Never Used  Substance and Sexual Activity   Alcohol use: Yes    Comment: occ-Social per pt   Drug use: No   Sexual activity: Not Currently  Other Topics Concern   Not on file  Social History Narrative   Divorced 7 years ago   3 children   Works in Careers adviser   Social Drivers of Health   Financial Resource Strain: Low Risk  (12/24/2021)   Overall Financial Resource Strain (CARDIA)    Difficulty of Paying  Living Expenses: Not hard at all  Food Insecurity: No Food Insecurity (12/24/2021)   Hunger Vital Sign    Worried About Running Out of Food in the Last Year: Never true    Ran Out of Food in the Last Year: Never true  Transportation Needs: No Transportation Needs (12/24/2021)   PRAPARE - Administrator, Civil Service (Medical): No    Lack of Transportation (Non-Medical): No  Physical Activity: Sufficiently Active (12/26/2022)   Exercise Vital Sign    Days of Exercise per Week: 3 days    Minutes of Exercise per Session: 50 min  Stress: No Stress Concern Present (12/26/2022)   [employer] of Occupational Health - Occupational Stress Questionnaire    Feeling of Stress : Only a little  Social Connections: Moderately Integrated (12/26/2022)   Social Connection and Isolation Panel [NHANES]    Frequency of Communication with Friends and Family: Three times a week    Frequency of Social Gatherings with Friends and Family: Twice a week    Attends Religious Services: More than 4 times per year    Active Member of Clubs or Organizations: Yes    Attends Banker Meetings: More than 4 times per year    Marital Status: Divorced  Intimate Partner Violence: Not At Risk (12/12/2020)   Humiliation, Afraid, Rape, and Kick questionnaire    Fear of Current or Ex-Partner: No    Emotionally Abused: No    Physically Abused: No    Sexually Abused: No    Past Surgical History:  Procedure Laterality Date   APPENDECTOMY  1970's   CARDIAC CATHETERIZATION  06/2012   CORONARY ARTERY BYPASS GRAFT  06/29/2012   Procedure: CORONARY ARTERY BYPASS GRAFTING (CABG);  Surgeon: Loreli Slot, MD;  Location: [facility] OR;  Service: Open Heart Surgery;  Laterality: N/A;  times two using Left Internal Mammary Artery   INGUINAL HERNIA REPAIR Bilateral [phone]   LEFT HEART CATH AND CORS/GRAFTS ANGIOGRAPHY N/A 08/16/2019   Procedure: LEFT HEART CATH AND CORS/GRAFTS ANGIOGRAPHY;  Surgeon: Marykay Lex, MD;  Location[facility] INVASIVE CV LAB;  Service: Cardiovascular;  Laterality: N/A;   LEFT HEART CATHETERIZATION WITH CORONARY ANGIOGRAM N/A 06/26/2012   Procedure: LEFT HEART CATHETERIZATION WITH CORONARY ANGIOGRAM;  Surgeon: Wendall Stade, MD;  Location: [facility] CATH LAB;  Service: Cardiovascular;  Laterality: N/A;   LEFT HEART CATHETERIZATION WITH CORONARY/GRAFT ANGIOGRAM N/A 06/03/2014   Procedure: LEFT HEART CATHETERIZATION WITH Isabel Caprice;  Surgeon: Corky Crafts, MD;  Location: [facility] CATH LAB;  Service: Cardiovascular;  Laterality: N/A;   NASAL SEPTUM SURGERY     For uncontrolled epistaxis early 2013   SIGMOIDOSCOPY  10-15 years ago   in [place], [place]    Family History  Problem Relation Age of Onset   Heart disease Brother        CABG age 61, redo bypass ~50   Heart disease Paternal Grandmother  Heart disease Cousin        Maternal side   Heart attack Brother    Colon cancer Maternal Grandfather 60       METS to colon from Lung CA per pt   Lung cancer Maternal Grandfather    Colon polyps Neg Hx    Esophageal cancer Neg Hx    Rectal cancer Neg Hx    Stomach cancer Neg Hx     No Known Allergies  Current Outpatient Medications on File Prior to Visit  Medication Sig Dispense Refill   amLODipine (NORVASC) 2.5 MG tablet Take 1 tablet (2.5 mg total) by mouth daily. 90 tablet 2   aspirin EC 81 MG tablet Take 1 tablet (81 mg total) by mouth daily. Swallow whole.     atorvastatin (LIPITOR) 40 MG tablet TAKE 1 TABLET BY MOUTH EVERY NIGHT AT BEDTIME 90 tablet 0   Cholecalciferol (D3) 50 MCG (2000 UT) TABS Take by mouth.     citalopram (CELEXA) 10 MG tablet Take 1 tablet (10 mg total) by mouth daily. 90 tablet 1   Cobalamin Combinations (B-12) 1000-400 MCG SUBL Take by oral route.     diltiazem (CARDIZEM) 30 MG tablet Take 1 tablet (30 mg total) by mouth 4 (four) times daily as needed (elevated heart rates). 30 tablet 3   fluticasone (FLONASE) 50 MCG/ACT nasal spray  Place 2 sprays into both nostrils daily. (Patient taking differently: Place 2 sprays into both nostrils as needed.) 16 g 6   Fluticasone Furoate-Vilanterol 50-25 MCG/ACT AEPB Inhale by inhalation route.     ibuprofen (ADVIL,MOTRIN) 200 MG tablet Take 400-600 mg by mouth every 6 (six) hours as needed for headache or moderate pain.      isosorbide mononitrate (IMDUR) 30 MG 24 hr tablet TAKE 1 TABLET BY MOUTH DAILY 90 tablet 3   nitroGLYCERIN (NITROSTAT) 0.4 MG SL tablet Place 1 tablet (0.4 mg total) under the tongue every 5 (five) minutes as needed for chest pain. 25 tablet 3   omeprazole (PRILOSEC OTC) 20 MG tablet Take 20 mg by mouth daily as needed (acid reflux).     sotalol (BETAPACE) 120 MG tablet Take 1 tablet (120 mg total) by mouth every 12 (twelve) hours. 180 tablet 3   valsartan (DIOVAN) 160 MG tablet TAKE 1 TABLET BY MOUTH DAILY 90 tablet 1   vitamin B-12 (CYANOCOBALAMIN) 1000 MCG tablet Take 1,000 mcg by mouth daily.     No current facility-administered medications on file prior to visit.    BP 128/88   Pulse (!) 57   Temp 97.8 F (36.6 C) (Oral)   Ht 5' 10.75" (1.797 m)   Wt 199 lb (90.3 kg)   SpO2 97%   BMI 27.95 kg/m       Objective:   Physical Exam Vitals and nursing note reviewed.  Constitutional:      General: He is not in acute distress.    Appearance: Normal appearance. He is not ill-appearing.  HENT:     Head: Normocephalic and atraumatic.     Right Ear: Tympanic membrane, ear canal and external ear normal. There is no impacted cerumen.     Left Ear: Tympanic membrane, ear canal and external ear normal. There is no impacted cerumen.     Nose: Nose normal. No congestion or rhinorrhea.     Mouth/Throat:     Mouth: Mucous membranes are moist.     Pharynx: Oropharynx is clear.  Eyes:     Extraocular Movements: Extraocular movements  intact.     Conjunctiva/sclera: Conjunctivae normal.     Pupils: Pupils are equal, round, and reactive to light.  Neck:      Vascular: No carotid bruit.  Cardiovascular:     Rate and Rhythm: Normal rate and regular rhythm.     Pulses: Normal pulses.     Heart sounds: No murmur heard.    No friction rub. No gallop.  Pulmonary:     Effort: Pulmonary effort is normal.     Breath sounds: Normal breath sounds.  Abdominal:     General: Abdomen is flat. Bowel sounds are normal. There is no distension.     Palpations: Abdomen is soft. There is no mass.     Tenderness: There is no abdominal tenderness. There is no guarding or rebound.     Hernia: No hernia is present.  Musculoskeletal:        General: Normal range of motion.     Cervical back: Normal range of motion and neck supple.  Lymphadenopathy:     Cervical: No cervical adenopathy.  Skin:    General: Skin is warm and dry.     Capillary Refill: Capillary refill takes less than 2 seconds.  Neurological:     General: No focal deficit present.     Mental Status: He is alert and oriented to person, place, and time.  Psychiatric:        Mood and Affect: Mood normal.        Behavior: Behavior normal.        Thought Content: Thought content normal.        Judgment: Judgment normal.       Assessment & Plan:  1. Anxiety and depression (Primary) Today patient counseled on age appropriate routine health concerns for screening and prevention, each reviewed and up to date or declined. Immunizations reviewed and up to date or declined. Labs ordered and reviewed. Risk factors for depression reviewed and negative. Hearing function and visual acuity are intact. ADLs screened and addressed as needed. Functional ability and level of safety reviewed and appropriate. Education, counseling and referrals performed based on assessed risks today. Patient provided with a copy of personalized plan for preventive services. - Follow up in on year or sooner if needed  2. Essential hypertension - At goal today  - Lipid panel; Future - TSH; Future - CBC; Future - Comprehensive  metabolic panel; Future  3. Coronary artery disease involving native coronary artery of native heart without angina pectoris - Per Cardiology  - Continue with ASA and statin  - Lipid panel; Future - TSH; Future - CBC; Future - Comprehensive metabolic panel; Future  4. Atrial tachycardia (HCC) - Per cardiology  - Lipid panel; Future - TSH; Future - CBC; Future - Comprehensive metabolic panel; Future - Magnesium; Future  5. Hyperlipidemia, unspecified hyperlipidemia type - Continue with statin  - Lipid panel; Future - TSH; Future - CBC; Future - Comprehensive metabolic panel; Future  6. Benign prostatic hyperplasia with weak urinary stream  - PSA; Future  Shirline Frees, NP

## 2023-10-20 ENCOUNTER — Other Ambulatory Visit: Payer: Self-pay | Admitting: Adult Health

## 2023-11-26 ENCOUNTER — Ambulatory Visit: Payer: Medicare Other

## 2023-11-26 ENCOUNTER — Ambulatory Visit

## 2023-11-26 VITALS — Ht 71.0 in | Wt 199.0 lb

## 2023-11-26 DIAGNOSIS — Z0000 Encounter for general adult medical examination without abnormal findings: Secondary | ICD-10-CM | POA: Diagnosis not present

## 2023-11-26 NOTE — Patient Instructions (Addendum)
 Terry Hansen , Thank you for taking time to come for your Medicare Wellness Visit. I appreciate your ongoing commitment to your health goals. Please review the following plan we discussed and let me know if I can assist you in the future.   Referrals/Orders/Follow-Ups/Clinician Recommendations:   This is a list of the screening recommended for you and due dates:  Health Maintenance  Topic Date Due   COVID-19 Vaccine (1) Never done   Zoster (Shingles) Vaccine (1 of 2) 12/07/2023*   DTaP/Tdap/Td vaccine (1 - Tdap) 09/08/2024*   Pneumonia Vaccine (2 of 2 - PPSV23) 09/08/2024*   Flu Shot  02/20/2024   Medicare Annual Wellness Visit  11/25/2024   Colon Cancer Screening  03/04/2026   Hepatitis C Screening  Completed   HPV Vaccine  Aged Out   Meningitis B Vaccine  Aged Out  *Topic was postponed. The date shown is not the original due date.    Advanced directives: (Copy Requested) Please bring a copy of your health care power of attorney and living will to the office to be added to your chart at your convenience. You can mail to [facility] [address] [place], [place] [address] or email to [email or web address]  Next Medicare Annual Wellness Visit scheduled for next year: Yes

## 2023-11-26 NOTE — Progress Notes (Signed)
 Subjective:   Terry Hansen is a 68 y.o. who presents for a Medicare Wellness preventive visit.  Visit Complete: Virtual I connected with  Terry Hansen on 11/26/23 by a audio enabled telemedicine application and verified that I am speaking with the correct person using two identifiers.  Patient Location: Home  Provider Location: Home Office  I discussed the limitations of evaluation and management by telemedicine. The patient expressed understanding and agreed to proceed.  Vital Signs: Because this visit was a virtual/telehealth visit, some criteria may be missing or patient reported. Any vitals not documented were not able to be obtained and vitals that have been documented are patient reported.    Persons Participating in Visit: Patient.  AWV Questionnaire: No: Patient Medicare AWV questionnaire was not completed prior to this visit.  Cardiac Risk Factors include: advanced age (>58men, >14 women);male gender;hypertension     Objective:    Today's Vitals   11/26/23 0816  Weight: 199 lb (90.3 kg)  Height: 5\' 11"  (1.803 m)   Body mass index is 27.75 kg/m.     11/26/2023    8:22 AM 12/24/2021    2:33 PM 12/12/2020   10:27 AM 08/16/2019    9:23 AM 07/19/2019    5:00 PM 07/19/2019   11:55 AM 07/10/2019    2:16 AM  Advanced Directives  Does Patient Have a Medical Advance Directive? Yes Yes Yes Yes No No No  Type of Estate agent of [place];Living will Healthcare Power of [place];Living will Living will Healthcare Power of [place];Living will     Copy of Healthcare Power of Attorney in Chart? No - copy requested No - copy requested       Would patient like information on creating a medical advance directive?     No - Patient declined No - Patient declined No - Guardian declined    Current Medications (verified) Outpatient Encounter Medications as of 11/26/2023  Medication Sig   amLODipine  (NORVASC ) 2.5 MG tablet Take 1 tablet (2.5 mg total) by mouth  daily.   aspirin  EC 81 MG tablet Take 1 tablet (81 mg total) by mouth daily. Swallow whole.   atorvastatin  (LIPITOR) 40 MG tablet TAKE 1 TABLET BY MOUTH EVERY NIGHT AT BEDTIME   Cholecalciferol (D3) 50 MCG (2000 UT) TABS Take by mouth.   citalopram  (CELEXA ) 10 MG tablet Take 1 tablet (10 mg total) by mouth daily.   Cobalamin Combinations (B-12) 1000-400 MCG SUBL Take by oral route.   diltiazem  (CARDIZEM ) 30 MG tablet Take 1 tablet (30 mg total) by mouth 4 (four) times daily as needed (elevated heart rates).   fluticasone  (FLONASE ) 50 MCG/ACT nasal spray Place 2 sprays into both nostrils daily. (Patient taking differently: Place 2 sprays into both nostrils as needed.)   ibuprofen (ADVIL,MOTRIN) 200 MG tablet Take 400-600 mg by mouth every 6 (six) hours as needed for headache or moderate pain.    isosorbide  mononitrate (IMDUR ) 30 MG 24 hr tablet TAKE 1 TABLET BY MOUTH DAILY   nitroGLYCERIN  (NITROSTAT ) 0.4 MG SL tablet Place 1 tablet (0.4 mg total) under the tongue every 5 (five) minutes as needed for chest pain.   omeprazole (PRILOSEC OTC) 20 MG tablet Take 20 mg by mouth daily as needed (acid reflux).   sotalol  (BETAPACE ) 120 MG tablet Take 1 tablet (120 mg total) by mouth every 12 (twelve) hours.   valsartan  (DIOVAN ) 160 MG tablet TAKE 1 TABLET BY MOUTH DAILY   vitamin B-12 (CYANOCOBALAMIN) 1000 MCG tablet Take 1,000  mcg by mouth daily.   No facility-administered encounter medications on file as of 11/26/2023.    Allergies (verified) Patient has no known allergies.   History: Past Medical History:  Diagnosis Date   CAD (coronary artery disease)    a.  LHC 06/26/12:  pLAD 90%, prox/mid/dist CFS 20-30%, oRCA 100% (non dom), EF 55%;  b. Echo 12/13:  EF 55-60%, Gr 1 diast dysfn;  c. s/p CABG [facility]) 12/13: L-LAD/Dx     GERD (gastroesophageal reflux disease)    HLD (hyperlipidemia)    Hypertension    Ocular migraine    "maybe once q couple months" (06/02/2014)   Unstable angina pectoris  [facility])    Past Surgical History:  Procedure Laterality Date   APPENDECTOMY  1970's   CARDIAC CATHETERIZATION  06/2012   CORONARY ARTERY BYPASS GRAFT  06/29/2012   Procedure: CORONARY ARTERY BYPASS GRAFTING (CABG);  Surgeon: Zelphia Higashi, MD;  Location: [facility] OR;  Service: Open Heart Surgery;  Laterality: N/A;  times two using Left Internal Mammary Artery   INGUINAL HERNIA REPAIR Bilateral [phone]   LEFT HEART CATH AND CORS/GRAFTS ANGIOGRAPHY N/A 08/16/2019   Procedure: LEFT HEART CATH AND CORS/GRAFTS ANGIOGRAPHY;  Surgeon: Arleen Lacer, MD;  Location[facility] INVASIVE CV LAB;  Service: Cardiovascular;  Laterality: N/A;   LEFT HEART CATHETERIZATION WITH CORONARY ANGIOGRAM N/A 06/26/2012   Procedure: LEFT HEART CATHETERIZATION WITH CORONARY ANGIOGRAM;  Surgeon: Loyde Rule, MD;  Location: [facility] CATH LAB;  Service: Cardiovascular;  Laterality: N/A;   LEFT HEART CATHETERIZATION WITH CORONARY/GRAFT ANGIOGRAM N/A 06/03/2014   Procedure: LEFT HEART CATHETERIZATION WITH Estella Helling;  Surgeon: Lucendia Rusk, MD;  Location: [facility] CATH LAB;  Service: Cardiovascular;  Laterality: N/A;   NASAL SEPTUM SURGERY     For uncontrolled epistaxis early 2013   SIGMOIDOSCOPY  10-15 years ago   in [place], [place]   Family History  Problem Relation Age of Onset   Heart disease Brother        CABG age 40, redo bypass ~50   Heart disease Paternal Grandmother    Heart disease Cousin        Maternal side   Heart attack Brother    Colon cancer Maternal Grandfather 60       METS to colon from Lung CA per pt   Lung cancer Maternal Grandfather    Colon polyps Neg Hx    Esophageal cancer Neg Hx    Rectal cancer Neg Hx    Stomach cancer Neg Hx    Social History   Socioeconomic History   Marital status: Divorced    Spouse name: Not on file   Number of children: Not on file   Years of education: Not on file   Highest education level: Not on file  Occupational History   Occupation: retired   Tobacco Use   Smoking status: Never   Smokeless tobacco: Never  Vaping Use   Vaping status: Never Used  Substance and Sexual Activity   Alcohol use: Yes    Comment: occ-Social per pt   Drug use: No   Sexual activity: Not Currently  Other Topics Concern   Not on file  Social History Narrative   Divorced 7 years ago   3 children   Works in Careers adviser   Social Drivers of Health   Financial Resource Strain: Low Risk  (11/26/2023)   Overall Financial Resource Strain (CARDIA)    Difficulty of Paying Living Expenses: Not hard at all  Food Insecurity: No Food Insecurity (11/26/2023)   Hunger Vital Sign    Worried About Running Out of Food in the Last Year: Never true    Ran Out of Food in the Last Year: Never true  Transportation Needs: No Transportation Needs (11/26/2023)   PRAPARE - Administrator, Civil Service (Medical): No    Lack of Transportation (Non-Medical): No  Physical Activity: Sufficiently Active (11/26/2023)   Exercise Vital Sign    Days of Exercise per Week: 5 days    Minutes of Exercise per Session: 40 min  Stress: No Stress Concern Present (11/26/2023)   [employer] of Occupational Health - Occupational Stress Questionnaire    Feeling of Stress : Not at all  Social Connections: Moderately Isolated (11/26/2023)   Social Connection and Isolation Panel [NHANES]    Frequency of Communication with Friends and Family: More than three times a week    Frequency of Social Gatherings with Friends and Family: More than three times a week    Attends Religious Services: Never    Database administrator or Organizations: No    Attends Engineer, structural: Never    Marital Status: Married    Tobacco Counseling Counseling given: Not Answered    Clinical Intake:  Pre-visit preparation completed: Yes  Pain : No/denies pain     BMI - recorded: 27.75 Nutritional Status: BMI 25 -29 Overweight Nutritional Risks: None Diabetes: No  Lab  Results  Component Value Date   HGBA1C 5.4 04/08/2019   HGBA1C 5.6 03/04/2018   HGBA1C 5.6 06/25/2012     How often do you need to have someone help you when you read instructions, pamphlets, or other written materials from your doctor or pharmacy?: 1 - Never  Interpreter Needed?: No  Information entered by :: Farris Hong LPN   Activities of Daily Living     11/26/2023    8:21 AM  In your present state of health, do you have any difficulty performing the following activities:  Hearing? 0  Vision? 0  Difficulty concentrating or making decisions? 0  Walking or climbing stairs? 0  Dressing or bathing? 0  Doing errands, shopping? 0  Preparing Food and eating ? N  Using the Toilet? N  In the past six months, have you accidently leaked urine? N  Do you have problems with loss of bowel control? N  Managing your Medications? N  Managing your Finances? N  Housekeeping or managing your Housekeeping? N    Patient Care Team: Alto Atta, NP as PCP - General (Family Medicine) Loyde Rule, MD as PCP - Cardiology (Cardiology) Lei Pump, MD as PCP - Electrophysiology (Cardiology) Danise Durie, Craige Dixon, MD as Attending Physician (Cardiology) Pryor, Madeline G, [facility] (Inactive) as Pharmacist (Pharmacist)  Indicate any recent Medical Services you may have received from other than [facility] providers in the past year (date may be approximate).     Assessment:   This is a routine wellness examination for [place].  Hearing/Vision screen Hearing Screening - Comments:: Denies hearing difficulties   Vision Screening - Comments:: Wears rx glasses - up to date with routine eye exams with  [facility].   Goals Addressed               This Visit's Progress     Remain active (pt-stated)         Depression Screen     11/26/2023    8:20 AM 09/09/2023    8:48 AM 02/19/2023  11:14 AM 12/26/2022   12:23 PM 12/24/2021    2:34 PM 07/18/2021    9:41 AM 12/12/2020   10:26 AM  PHQ  2/9 Scores  PHQ - 2 Score 0 0 2 0 0 0 0  PHQ- 9 Score  3 13        Fall Risk     11/26/2023    8:21 AM 09/09/2023    8:48 AM 02/19/2023   11:14 AM 12/26/2022   12:24 PM 12/24/2021    2:34 PM  Fall Risk   Falls in the past year? 0 0 0 0 0  Number falls in past yr: 0 0 0 0 0  Injury with Fall? 0 0 0 0 0  Risk for fall due to : No Fall Risks No Fall Risks No Fall Risks No Fall Risks Medication side effect  Follow up Falls prevention discussed;Falls evaluation completed Falls evaluation completed Falls evaluation completed Falls evaluation completed Falls evaluation completed;Education provided;Falls prevention discussed    MEDICARE RISK AT HOME:  Medicare Risk at Home Any stairs in or around the home?: No If so, are there any without handrails?: No Home free of loose throw rugs in walkways, pet beds, electrical cords, etc?: Yes Adequate lighting in your home to reduce risk of falls?: Yes Life alert?: No Use of a cane, walker or w/c?: No Grab bars in the bathroom?: No Shower chair or bench in shower?: Yes Elevated toilet seat or a handicapped toilet?: No  TIMED UP AND GO:  Was the test performed?  No  Cognitive Function: 6CIT completed        11/26/2023    8:22 AM 12/24/2021    2:37 PM 12/12/2020   10:30 AM  6CIT Screen  What Year? 0 points 0 points 0 points  What month? 0 points 0 points 0 points  What time? 0 points 0 points 0 points  Count back from 20 0 points 0 points 0 points  Months in reverse 0 points 0 points 0 points  Repeat phrase 0 points 4 points 2 points  Total Score 0 points 4 points 2 points    Immunizations Immunization History  Administered Date(s) Administered   Fluad Quad(high Dose 65+) 09/06/2020   Pneumococcal Conjugate-13 06/15/2019    Screening Tests Health Maintenance  Topic Date Due   COVID-19 Vaccine (1) Never done   Zoster Vaccines- Shingrix (1 of 2) 12/07/2023 (Originally 08/27/1974)   DTaP/Tdap/Td (1 - Tdap) 09/08/2024 (Originally 08/27/1974)    Pneumonia Vaccine 86+ Years old (2 of 2 - PPSV23) 09/08/2024 (Originally 08/10/2019)   INFLUENZA VACCINE  02/20/2024   Medicare Annual Wellness (AWV)  11/25/2024   Colonoscopy  03/04/2026   Hepatitis C Screening  Completed   HPV VACCINES  Aged Out   Meningococcal B Vaccine  Aged Out    Health Maintenance  Health Maintenance Due  Topic Date Due   COVID-19 Vaccine (1) Never done   Health Maintenance Items Addressed:   Additional Screening:  Vision Screening: Recommended annual ophthalmology exams for early detection of glaucoma and other disorders of the eye.  Dental Screening: Recommended annual dental exams for proper oral hygiene  Community Resource Referral / Chronic Care Management: CRR required this visit?  No   CCM required this visit?  No     Plan:     I have personally reviewed and noted the following in the patient's chart:   Medical and social history Use of alcohol, tobacco or illicit drugs  Current medications and  supplements including opioid prescriptions. Patient is not currently taking opioid prescriptions. Functional ability and status Nutritional status Physical activity Advanced directives List of other physicians Hospitalizations, surgeries, and ER visits in previous 12 months Vitals Screenings to include cognitive, depression, and falls Referrals and appointments  In addition, I have reviewed and discussed with patient certain preventive protocols, quality metrics, and best practice recommendations. A written personalized care plan for preventive services as well as general preventive health recommendations were provided to patient.     Dewayne Ford, LPN   [id number]/[id number]   After Visit Summary: (MyChart) Due to this being a telephonic visit, the after visit summary with patients personalized plan was offered to patient via MyChart   Notes: Nothing significant to report at this time.

## 2024-01-15 ENCOUNTER — Other Ambulatory Visit: Payer: Self-pay | Admitting: Adult Health

## 2024-01-15 DIAGNOSIS — F32A Depression, unspecified: Secondary | ICD-10-CM

## 2024-01-29 ENCOUNTER — Other Ambulatory Visit: Payer: Self-pay | Admitting: Physician Assistant

## 2024-02-09 ENCOUNTER — Telehealth: Payer: Self-pay | Admitting: Physician Assistant

## 2024-02-09 MED ORDER — VALSARTAN 160 MG PO TABS: 160.0000 mg | ORAL_TABLET | Freq: Every day | ORAL | 1 refills | Status: DC

## 2024-02-09 NOTE — Telephone Encounter (Signed)
*  STAT* If patient is at the pharmacy, call can be transferred to refill team.   1. Which medications need to be refilled? (please list name of each medication and dose if known) valsartan  (DIOVAN ) 160 MG tablet    2. Would you like to learn more about the convenience, safety, & potential cost savings by using the [facility]?     3. Are you open to using the [facility] (Type [facility].  ).   4. Which pharmacy/location (including street and city if local pharmacy) is medication to be sent to?  [facility] [id number] - [place], [place] - [address]     5. Do they need a 30 day or 90 day supply? 90 day

## 2024-02-09 NOTE — Telephone Encounter (Signed)
 RX sent to requested Pharmacy

## 2024-03-12 ENCOUNTER — Other Ambulatory Visit: Payer: Self-pay | Admitting: Cardiovascular Disease

## 2024-03-16 ENCOUNTER — Other Ambulatory Visit: Payer: Self-pay | Admitting: Cardiovascular Disease

## 2024-04-01 ENCOUNTER — Other Ambulatory Visit: Payer: Self-pay

## 2024-04-01 MED ORDER — SOTALOL HCL 120 MG PO TABS: 120.0000 mg | ORAL_TABLET | Freq: Two times a day (BID) | ORAL | 2 refills | Status: AC

## 2024-04-10 ENCOUNTER — Other Ambulatory Visit: Payer: Self-pay | Admitting: Adult Health

## 2024-04-28 ENCOUNTER — Other Ambulatory Visit: Payer: Self-pay | Admitting: Adult Health

## 2024-04-28 DIAGNOSIS — J069 Acute upper respiratory infection, unspecified: Secondary | ICD-10-CM

## 2024-07-09 ENCOUNTER — Other Ambulatory Visit: Payer: Self-pay | Admitting: Adult Health

## 2024-07-09 DIAGNOSIS — F419 Anxiety disorder, unspecified: Secondary | ICD-10-CM

## 2024-08-03 ENCOUNTER — Other Ambulatory Visit: Payer: Self-pay | Admitting: Physician Assistant

## 2024-10-14 ENCOUNTER — Ambulatory Visit: Admitting: Adult Health

## 2024-10-14 ENCOUNTER — Ambulatory Visit: Admitting: Physician Assistant

## 2024-10-14 ENCOUNTER — Ambulatory Visit: Admitting: Pulmonary Disease

## 2024-12-01 ENCOUNTER — Ambulatory Visit

## 2024-12-22 ENCOUNTER — Ambulatory Visit: Admitting: Cardiovascular Disease
